# Patient Record
Sex: Male | Born: 1965 | Race: Black or African American | Hispanic: No | Marital: Single | Smoking: Never smoker
Health system: Southern US, Community
[De-identification: ages and names within clinical notes are randomized; demographics above are authoritative.]

## PROBLEM LIST (undated history)

## (undated) DIAGNOSIS — J329 Chronic sinusitis, unspecified: Secondary | ICD-10-CM

## (undated) DIAGNOSIS — E785 Hyperlipidemia, unspecified: Secondary | ICD-10-CM

## (undated) DIAGNOSIS — M1990 Unspecified osteoarthritis, unspecified site: Secondary | ICD-10-CM

## (undated) DIAGNOSIS — I2699 Other pulmonary embolism without acute cor pulmonale: Secondary | ICD-10-CM

## (undated) DIAGNOSIS — G4730 Sleep apnea, unspecified: Secondary | ICD-10-CM

## (undated) DIAGNOSIS — I82409 Acute embolism and thrombosis of unspecified deep veins of unspecified lower extremity: Secondary | ICD-10-CM

## (undated) DIAGNOSIS — F329 Major depressive disorder, single episode, unspecified: Secondary | ICD-10-CM

## (undated) DIAGNOSIS — I739 Peripheral vascular disease, unspecified: Secondary | ICD-10-CM

## (undated) DIAGNOSIS — F32A Depression, unspecified: Secondary | ICD-10-CM

## (undated) DIAGNOSIS — D649 Anemia, unspecified: Secondary | ICD-10-CM

## (undated) DIAGNOSIS — I10 Essential (primary) hypertension: Secondary | ICD-10-CM

## (undated) DIAGNOSIS — G5600 Carpal tunnel syndrome, unspecified upper limb: Secondary | ICD-10-CM

## (undated) DIAGNOSIS — E669 Obesity, unspecified: Secondary | ICD-10-CM

## (undated) HISTORY — DX: Carpal tunnel syndrome, unspecified upper limb: G56.00

## (undated) HISTORY — DX: Hyperlipidemia, unspecified: E78.5

## (undated) HISTORY — DX: Depression, unspecified: F32.A

## (undated) HISTORY — DX: Unspecified osteoarthritis, unspecified site: M19.90

## (undated) HISTORY — DX: Chronic sinusitis, unspecified: J32.9

## (undated) HISTORY — PX: OTHER SURGICAL HISTORY: SHX169

## (undated) HISTORY — DX: Major depressive disorder, single episode, unspecified: F32.9

## (undated) HISTORY — PX: TONSILLECTOMY: SUR1361

---

## 2012-03-20 ENCOUNTER — Encounter (HOSPITAL_COMMUNITY): Payer: Self-pay

## 2012-03-20 ENCOUNTER — Emergency Department (HOSPITAL_COMMUNITY): Payer: Medicaid Other

## 2012-03-20 ENCOUNTER — Inpatient Hospital Stay (HOSPITAL_COMMUNITY)
Admission: EM | Admit: 2012-03-20 | Discharge: 2012-03-25 | DRG: 176 | Disposition: A | Discharge status: Home / Self Care | Payer: Medicaid Other | Attending: Family Medicine | Admitting: Family Medicine

## 2012-03-20 DIAGNOSIS — E871 Hypo-osmolality and hyponatremia: Secondary | ICD-10-CM

## 2012-03-20 DIAGNOSIS — Z79899 Other long term (current) drug therapy: Secondary | ICD-10-CM

## 2012-03-20 DIAGNOSIS — I824Y9 Acute embolism and thrombosis of unspecified deep veins of unspecified proximal lower extremity: Secondary | ICD-10-CM | POA: Diagnosis present

## 2012-03-20 DIAGNOSIS — E872 Acidosis, unspecified: Secondary | ICD-10-CM | POA: Diagnosis present

## 2012-03-20 DIAGNOSIS — I872 Venous insufficiency (chronic) (peripheral): Secondary | ICD-10-CM | POA: Diagnosis present

## 2012-03-20 DIAGNOSIS — D696 Thrombocytopenia, unspecified: Secondary | ICD-10-CM

## 2012-03-20 DIAGNOSIS — M79606 Pain in leg, unspecified: Secondary | ICD-10-CM

## 2012-03-20 DIAGNOSIS — K5900 Constipation, unspecified: Secondary | ICD-10-CM | POA: Diagnosis not present

## 2012-03-20 DIAGNOSIS — E1129 Type 2 diabetes mellitus with other diabetic kidney complication: Secondary | ICD-10-CM

## 2012-03-20 DIAGNOSIS — Z86711 Personal history of pulmonary embolism: Secondary | ICD-10-CM

## 2012-03-20 DIAGNOSIS — I2692 Saddle embolus of pulmonary artery without acute cor pulmonale: Principal | ICD-10-CM | POA: Diagnosis present

## 2012-03-20 DIAGNOSIS — K7689 Other specified diseases of liver: Secondary | ICD-10-CM | POA: Diagnosis present

## 2012-03-20 DIAGNOSIS — Z6841 Body Mass Index (BMI) 40.0 and over, adult: Secondary | ICD-10-CM

## 2012-03-20 DIAGNOSIS — R000 Tachycardia, unspecified: Secondary | ICD-10-CM

## 2012-03-20 DIAGNOSIS — I10 Essential (primary) hypertension: Secondary | ICD-10-CM | POA: Diagnosis present

## 2012-03-20 DIAGNOSIS — E8721 Acute metabolic acidosis: Secondary | ICD-10-CM

## 2012-03-20 DIAGNOSIS — Z86718 Personal history of other venous thrombosis and embolism: Secondary | ICD-10-CM

## 2012-03-20 DIAGNOSIS — I2699 Other pulmonary embolism without acute cor pulmonale: Secondary | ICD-10-CM

## 2012-03-20 DIAGNOSIS — E119 Type 2 diabetes mellitus without complications: Secondary | ICD-10-CM | POA: Diagnosis present

## 2012-03-20 DIAGNOSIS — E8729 Other acidosis: Secondary | ICD-10-CM

## 2012-03-20 DIAGNOSIS — I878 Other specified disorders of veins: Secondary | ICD-10-CM

## 2012-03-20 DIAGNOSIS — D6959 Other secondary thrombocytopenia: Secondary | ICD-10-CM | POA: Diagnosis present

## 2012-03-20 DIAGNOSIS — R5381 Other malaise: Secondary | ICD-10-CM | POA: Diagnosis present

## 2012-03-20 DIAGNOSIS — R739 Hyperglycemia, unspecified: Secondary | ICD-10-CM

## 2012-03-20 HISTORY — DX: Anemia, unspecified: D64.9

## 2012-03-20 HISTORY — DX: Acute embolism and thrombosis of unspecified deep veins of unspecified lower extremity: I82.409

## 2012-03-20 HISTORY — DX: Essential (primary) hypertension: I10

## 2012-03-20 HISTORY — DX: Peripheral vascular disease, unspecified: I73.9

## 2012-03-20 HISTORY — DX: Obesity, unspecified: E66.9

## 2012-03-20 HISTORY — DX: Acute metabolic acidosis: E87.21

## 2012-03-20 LAB — URINALYSIS, ROUTINE W REFLEX MICROSCOPIC
Leukocytes, UA: NEGATIVE
Nitrite: NEGATIVE
Specific Gravity, Urine: >1.046 — ABNORMAL HIGH (ref 1.005–1.030)
pH: 5 (ref 5.0–8.0)

## 2012-03-20 LAB — PRO B NATRIURETIC PEPTIDE
Pro B Natriuretic peptide (BNP): 59.2 pg/mL (ref 0–125)
Pro B Natriuretic peptide (BNP): 72.9 pg/mL (ref 0–125)

## 2012-03-20 LAB — BASIC METABOLIC PANEL
BUN: 13 mg/dL (ref 6–23)
CO2: 16 mEq/L — ABNORMAL LOW (ref 19–32)
Calcium: 8.5 mg/dL (ref 8.4–10.5)
Chloride: 97 mEq/L (ref 96–112)
Creatinine, Ser: 0.94 mg/dL (ref 0.50–1.35)
GFR calc Af Amer: >90 mL/min (ref >90)
GFR calc non Af Amer: >90 mL/min (ref >90)
GFR calc non Af Amer: >90 mL/min (ref >90)
Potassium: 3.7 mEq/L (ref 3.5–5.1)
Potassium: 4.8 mEq/L (ref 3.5–5.1)
Sodium: 131 mEq/L — ABNORMAL LOW (ref 135–145)
Sodium: 133 mEq/L — ABNORMAL LOW (ref 135–145)
Sodium: 134 mEq/L — ABNORMAL LOW (ref 135–145)

## 2012-03-20 LAB — GLUCOSE, CAPILLARY
Glucose-Capillary: 535 mg/dL — ABNORMAL HIGH (ref 70–99)
Glucose-Capillary: 513 mg/dL — ABNORMAL HIGH (ref 70–99)
Glucose-Capillary: 438 mg/dL — ABNORMAL HIGH (ref 70–99)
Glucose-Capillary: 427 mg/dL — ABNORMAL HIGH (ref 70–99)
Glucose-Capillary: 358 mg/dL — ABNORMAL HIGH (ref 70–99)

## 2012-03-20 LAB — CBC
HCT: 46.7 % (ref 39.0–52.0)
RDW: 13.7 % (ref 11.5–15.5)
WBC: 6.3 10*3/uL (ref 4.0–10.5)

## 2012-03-20 LAB — DIFFERENTIAL
Basophils Absolute: 0 10*3/uL (ref 0.0–0.1)
Lymphocytes Relative: 51 % — ABNORMAL HIGH (ref 12–46)
Monocytes Absolute: 0.4 10*3/uL (ref 0.1–1.0)
Neutro Abs: 2.6 10*3/uL (ref 1.7–7.7)

## 2012-03-20 LAB — CARDIAC PANEL(CRET KIN+CKTOT+MB+TROPI)
Relative Index: 1.7 (ref 0.0–2.5)
Total CK: 252 U/L — ABNORMAL HIGH (ref 7–232)

## 2012-03-20 LAB — LACTIC ACID, PLASMA: Lactic Acid, Venous: 5 mmol/L — ABNORMAL HIGH (ref 0.5–2.2)

## 2012-03-20 LAB — TROPONIN I: Troponin I: 0.84 ng/mL (ref <0.30)

## 2012-03-20 LAB — TYPE AND SCREEN: ABO/RH(D): O POS

## 2012-03-20 LAB — D-DIMER, QUANTITATIVE: D-Dimer, Quant: >20 ug/mL-FEU — ABNORMAL HIGH (ref 0.00–0.48)

## 2012-03-20 LAB — PROCALCITONIN: Procalcitonin: 0.31 ng/mL

## 2012-03-20 LAB — KETONES, QUALITATIVE: Acetone, Bld: NEGATIVE

## 2012-03-20 MED ORDER — HEPARIN SODIUM (PORCINE) 5000 UNIT/ML IJ SOLN
INTRAMUSCULAR | Status: AC
  Administered 2012-03-20: 4000 [IU]
  Filled 2012-03-20: qty 1

## 2012-03-20 MED ORDER — MUPIROCIN 2 % EX OINT
1.0000 | TOPICAL_OINTMENT | Freq: Two times a day (BID) | CUTANEOUS | Status: AC
Start: 2012-03-20 — End: 2012-03-25
  Administered 2012-03-20 – 2012-03-25 (×10): 1 via NASAL
  Filled 2012-03-20 (×2): qty 22

## 2012-03-20 MED ORDER — HEPARIN BOLUS VIA INFUSION
4000.0000 [IU] | Freq: Once | INTRAVENOUS | Status: AC
  Administered 2012-03-20: 4000 [IU] via INTRAVENOUS

## 2012-03-20 MED ORDER — SODIUM CHLORIDE 0.9 % IV SOLN: INTRAVENOUS | Status: DC

## 2012-03-20 MED ORDER — CHLORHEXIDINE GLUCONATE CLOTH 2 % EX PADS
6.0000 | MEDICATED_PAD | Freq: Every day | CUTANEOUS | Status: AC
  Administered 2012-03-21 – 2012-03-25 (×5): 6 via TOPICAL

## 2012-03-20 MED ORDER — SODIUM CHLORIDE 0.9 % IV SOLN
INTRAVENOUS | Status: DC
  Administered 2012-03-20: 4.8 [IU]/h via INTRAVENOUS
  Administered 2012-03-21: 08:00:00 via INTRAVENOUS
  Administered 2012-03-21: 16 [IU]/h via INTRAVENOUS
  Filled 2012-03-20 (×3): qty 1

## 2012-03-20 MED ORDER — IOHEXOL 350 MG/ML SOLN
80.0000 mL | Freq: Once | INTRAVENOUS | Status: AC | PRN
  Administered 2012-03-20: 80 mL via INTRAVENOUS

## 2012-03-20 MED ORDER — PANTOPRAZOLE SODIUM 40 MG PO TBEC
40.0000 mg | DELAYED_RELEASE_TABLET | Freq: Every day | ORAL | Status: DC
  Administered 2012-03-21 – 2012-03-25 (×5): 40 mg via ORAL
  Filled 2012-03-20 (×5): qty 1

## 2012-03-20 MED ORDER — SODIUM CHLORIDE 0.9 % IV SOLN
INTRAVENOUS | Status: DC
  Administered 2012-03-21: 20 mL/h via INTRAVENOUS

## 2012-03-20 MED ORDER — HEPARIN (PORCINE) IN NACL 100-0.45 UNIT/ML-% IJ SOLN
1200.0000 [IU]/h | INTRAMUSCULAR | Status: DC
  Administered 2012-03-20: 1000 [IU]/h via INTRAVENOUS
  Administered 2012-03-22 – 2012-03-23 (×2): 1200 [IU]/h via INTRAVENOUS
  Filled 2012-03-20 (×9): qty 250

## 2012-03-20 NOTE — ED Provider Notes (Addendum)
History     CSN: [id number]  Arrival date & time 03/20/12  1337   First MD Initiated Contact with Patient 03/20/12 1352      Chief Complaint  Patient presents with  . Loss of Consciousness    (Consider location/radiation/quality/duration/timing/severity/associated sxs/prior treatment) Patient is a 46 y.o. male presenting with syncope. The history is provided by the patient and the EMS personnel. The history is limited by the condition of the patient.  Loss of Consciousness Pertinent negatives include no chest pain, no headaches and no shortness of breath.   patient is a 46 year old morbidly obese male, who has a history of pulmonary emboli, and prediabetes.  He has not been taking his Coumadin because he says he is unable to afford it.  He was walking and then, he collapsed and fell to the ground.  He denies chest pain, shortness of breath, nausea, vomiting, or palpitations.  Prior to falling.  He has not been sick over the past few days.  He denies pain now.  He does not have a headache, neck pain, weakness, or paresthesias.  He denies recent illness.  Level V caveat applies for urgent need for intervention  Past Medical History  Diagnosis Date  . Hypertension   . Deep vein thrombosis   . Obesity   . Diabetes mellitus     History reviewed. No pertinent past surgical history.  History reviewed. No pertinent family history.  History  Substance Use Topics  . Smoking status: Never Smoker   . Smokeless tobacco: Not on file  . Alcohol Use: No      Review of Systems  Constitutional: Negative for fever and chills.  HENT: Negative for nosebleeds.   Respiratory: Negative for cough, chest tightness and shortness of breath.   Cardiovascular: Positive for syncope. Negative for chest pain and palpitations.  Gastrointestinal: Negative for nausea and vomiting.  Neurological: Negative for seizures, weakness, numbness and headaches.  Psychiatric/Behavioral: Negative for confusion.    All other systems reviewed and are negative.    Allergies  Penicillins  Home Medications   Current Outpatient Rx  Name Route Sig Dispense Refill  . NAPROXEN SODIUM 220 MG PO TABS Oral Take 660 mg by mouth every 12 (twelve) hours. For tendonitis      BP 103/66  Pulse 111  Temp(Src) 99.1 F (37.3 C) (Rectal)  Resp 22  SpO2 98%  Physical Exam  Vitals reviewed. Constitutional: He is oriented to person, place, and time.       Morbidly obese  HENT:  Head: Normocephalic and atraumatic.  Eyes: Conjunctivae and EOM are normal.  Neck: Normal range of motion. Neck supple.  Cardiovascular:  No murmur heard.      Tachycardia  Pulmonary/Chest: Effort normal and breath sounds normal. No respiratory distress. He has no wheezes. He has no rales. He exhibits no tenderness.  Abdominal: Soft. He exhibits no distension.  Musculoskeletal: Normal range of motion. He exhibits no edema.  Neurological: He is alert and oriented to person, place, and time.  Skin: Skin is warm and dry.  Psychiatric: He has a normal mood and affect. Judgment and thought content normal.    ED Course  Procedures (including critical care time) 46 year old, male, with a history of pulmonary emboli, presents to the emergency department after collapsing while walking.  He was asymptomatic prior to falling and he is asymptomatic now.  He has a sinus tachycardia.  We will establish an IV and evaluate him for a pulmonary embolism, given his syncope,  and tachycardia.  Labs Reviewed  CBC - Abnormal; Notable for the following:    Platelets 57 (*) PLATELET COUNT CONFIRMED BY SMEAR   All other components within normal limits  DIFFERENTIAL - Abnormal; Notable for the following:    Neutrophils Relative 42 (*)    Lymphocytes Relative 51 (*)    All other components within normal limits  BASIC METABOLIC PANEL - Abnormal; Notable for the following:    Sodium 131 (*)    CO2 16 (*)    Glucose, Bld 608 (*)    All other  components within normal limits  D-DIMER, QUANTITATIVE - Abnormal; Notable for the following:    D-Dimer, Quant >20.00 (*)    All other components within normal limits  URINALYSIS, ROUTINE W REFLEX MICROSCOPIC   No results found.   No diagnosis found.  ED ECG REPORT   Date: 03/20/2012  EKG Time: 3:21 PM  Rate: 109  Rhythm: sinus tachycardia,    Axis: nl  Intervals:none  ST&T Change: nonspecific tw changes  Narrative Interpretation: sinus tach with nonspecific tw changes            CRITICAL CARE Performed by: Johnwesley Lederman P   Total critical care time: 30 min  Critical care time was exclusive of separately billable procedures and treating other patients.  Critical care was necessary to treat or prevent imminent or life-threatening deterioration.  Critical care was time spent personally by me on the following activities: development of treatment plan with patient and/or surrogate as well as nursing, discussions with consultants, evaluation of patient's response to treatment, examination of patient, obtaining history from patient or surrogate, ordering and performing treatments and interventions, ordering and review of laboratory studies, ordering and review of radiographic studies, pulse oximetry and re-evaluation of patient's condition.  Hyperglycemia IV insulin gtt   I spoke with the CCM md. .  I asked if he would be tpa candidate or if I should start heparin.   He did not tell me to start heparin.  He will send one of his colleagues to eval pt  4:13 PM Critical care service arrived and said to start heparin. MDM  Saddle embolism  Tachycardia hyperglycemia        Cheri Guppy, MD 03/20/12 1523  Cheri Guppy, MD 03/20/12 1601  Cheri Guppy, MD 03/20/12 [phone]

## 2012-03-20 NOTE — Progress Notes (Signed)
ANTICOAGULATION CONSULT NOTE - Initial Consult  Pharmacy Consult for heparin Indication: new PE  Allergies  Allergen Reactions  . Penicillins Hives    Patient Measurements: ht= 68 inches, weight= 143 kg (per patient)   Heparin Dosing Weight: 103 kg  Vital Signs: Temp: 99.1 F (37.3 C) (04/13 1402) Temp src: Rectal (04/13 1402) BP: 117/71 mmHg (04/13 1556) Pulse Rate: 116  (04/13 1556)  Labs:  Basename 03/20/12 1343  HGB 15.6  HCT 46.7  PLT 57*  APTT --  LABPROT --  INR --  HEPARINUNFRC --  CREATININE 0.94  CKTOTAL --  CKMB --  TROPONINI --   CrCl is unknown because there is no height on file for the current visit.  Medical History: Past Medical History  Diagnosis Date  . Hypertension   . Deep vein thrombosis   . Obesity   . Diabetes mellitus     Assessment: Patient is  46 y.o M  with hx PE and was supposed to be on coumadin. He stated that due to financial issues he has been off coumadin since December 2012. Chest CT angio now back positive for "saddle PE." Patient has thrombocytopenia with plt of 57 today.  He received heparin 4000 units IV at 1629 in the ED.  Goal of Therapy:  Heparin level 0.3-0.7 units/ml   Plan:  1) Will increase heparin drip to 1650 units/hr 2) check 6 hour heparin level 3) monitor closely for s/s of bleeding  Daisean Brodhead P 03/20/2012,4:35 PM

## 2012-03-20 NOTE — Progress Notes (Signed)
03/20/12 1624  Discharge Planning  Type of Residence Private residence  Living Arrangements Other relatives  Home Care Services No  Support Systems Other relatives  Do you have any problems obtaining your medications? Yes (Describe) (Per EMR, unable to obtain Coumadin)  Once you are discharged, how will you get to your follow-up appointment? Other (Comment) (Unable to identify at this time)  Expected Discharge Date 03/22/12  Social Work Consult Needed No   Dionne Milo MSW [facility] Emergency Dept. Weekend/Social Worker [phone]

## 2012-03-20 NOTE — Progress Notes (Signed)
03/20/12 1625  OTHER  CSW Follow Up Status No follow-up needed (Consult LCSW if psychosocial needs arise)   Dionne Milo MSW [facility] Emergency Dept. Weekend/Social Worker [phone]

## 2012-03-20 NOTE — ED Notes (Signed)
Heparin 4000 units given at 1629, not 5000 unit at 1700, error in charting while fizxi

## 2012-03-20 NOTE — ED Notes (Signed)
EMS reports pt found on the ground in front of apt, took 3 mins to arouse, went apnic, hypotensive,  78/60, sats 78%, then hypertensive 196/124,

## 2012-03-20 NOTE — ED Notes (Signed)
Dr Nino Parsley notified of lab draw glucose of 608

## 2012-03-20 NOTE — ED Notes (Signed)
IV team at the patient bedside 

## 2012-03-20 NOTE — H&P (Signed)
Name: Travis Rollins MRN: [id number] DOB: 09/26/1966    LOS: 0    History of Present Illness:  46 YO AAM, morbidly obese w/ PMH of HTN, DM, Chronic venous stasis of left leg, and prior DVT and PE (took self off coumadin 5 months ago). Approx 2 days prior to admit had what he though was muscle strain in Left posterior leg. Was walking to store on 4/13 and had sudden sharp grabbing sensation in his chest. This is similar to the presentation and his last pulmonary emboli 6 years ago. He he recalled this was followed by extreme shortness of breath, he then apparently had a syncopal episode. On EMS arrival he was hypotensive but responded to IVFs. Dx eval demonstrated Massive Pulmonary emboli.   Lines / Drains:   Cultures:   Antibiotics:   Tests / Events: 2D echo 4/13>>>> LE [place] 4/13>>> CT chest 4/13>>>Large acute pulmonary embolism spanning the main pulmonary arteries (saddle pulmonary embolism). Overall clot burden is severe and extends into the proximal branches of the left and right pulmonary arteries.   HPI This is a morbidly obese 46 year old male who moved to [place] approximately one year ago. Past medical history of DVT and what sounds like massive pulmonary emboli approximately 6 years ago. He had been on warfarin therapy until approximately 6 months prior to presentation. He removed himself off of this medication, because he had right foot pain which he felt was tendinitis, and he had wanted to take naproxen for analgesic relief. About that time he also removed himself from several other therapeutic medications including his antihypertensives and his metformin. He states he's been in his usual health with the exception of the being treated for cellulitis approximately 3 months ago. Also he notes approximately 2 days prior to presentation he had significant left posterior leg pain, which she associated with a presumed muscle strain.He states at baseline he can  ambulate approximately 10 minutes on flat going before onset of dyspnea. He is debilitated from chronic left lower extremity venous stasis disease and his obesity. He reported he was on his way to the store on 4/13 when he noticed a sudden sharp grabbing sensation in his chest. This is similar to the presentation and his last pulmonary emboli 6 years ago. He he recalled this was followed by extreme shortness of breath, he then apparently had a syncopal episode, this was witnessed and EMS was notified. Apparently on presentation he had brief period of apnea and unresponsiveness, as well as hypotension. He was given IV fluids, his level of consciousness improved, he was transferred to the emergency room and was in hospital. Diagnostic evaluation demonstrated a highly elevated d-dimer, and CT angiogram demonstrated a massive pulmonary emboli. Because of the size of pulmonary emboli, and the concern of clinical deterioration pulmonary critical care team was asked to. He has had no recent travel, no family history, he has had prior PE. And deep vein thrombosis in the past. On time of presentation he denies dyspnea, denies discomfort, denies chest pain, denies leg pain.  Past Medical History  Diagnosis Date  . Hypertension   . Deep vein thrombosis   . Obesity   . Diabetes mellitus    History reviewed. No pertinent past surgical history. Prior to Admission medications   Medication Sig Start Date End Date Taking? Authorizing Provider  naproxen sodium (ANAPROX) 220 MG tablet Take 660 mg by mouth every 12 (twelve) hours. For tendonitis   Yes Historical Provider, MD  Allergies Allergies  Allergen Reactions  . Penicillins Hives    Family History History reviewed. No pertinent family history.  Social History  reports that he has never smoked. He does not have any smokeless tobacco history on file. He reports that he does not drink alcohol or use illicit drugs.  Review Of Systems   Review of Systems    Constitutional: No weight loss, gain, night sweats, Fevers, chills, fatigue .  HEENT: No headaches, visual changes, Difficulty swallowing, Tooth/dental problems, or Sore throat,  No sneezing, itching, ear ache, nasal congestion, post nasal drip, no visual complaints CV: No chest pain except as described above, Orthopnea, PND, has chronic LLE swelling, no current dizziness,- palpitations, + syncope as described.  GI No heartburn, indigestion, abdominal pain, nausea, vomiting, diarrhea, change in bowel habits, loss of appetite, bloody stools.  Resp: No cough, No coughing up of blood. No change in color of mucus. No wheezing. SOB at baseline Skin: no rash or itching or icterus GU: no dysuria, change in color of urine, no urgency or frequency. No flank pain, no hematuria + urinary frequency MS: No joint pain or swelling. No decreased range of motion  Psych: No change in mood or affect. No depression or anxiety.  Neuro: no difficulty with speech, weakness, numbness, ataxia  Endo: + thirst   Vital Signs: BP 117/71  Pulse 116  Temp(Src) 99.1 F (37.3 C) (Rectal)  Resp 21  SpO2 98%       . sodium chloride    . heparin    . insulin (NOVOLIN-R) infusion    . insulin (NOVOLIN-R) infusion      No intake or output data in the 24 hours ending 03/20/12 1648  Physical Examination: General:  Morbidly obese male currently in no acute distress Neuro:  Awake alert and oriented no focal deficits HEENT:  Poor dentition, no JVD, mucous membranes moist Cardiovascular:  Regular rate and rhythm Lungs:  Decreased throughout Abdomen: obese, soft, no clear organomegaly Musculoskeletal:  intact Skin:  Left lower extremity with chronic venous stasis changes, left lower extremity more edematous than right  Ventilator settings:    Labs and Imaging:   Lab 03/20/12 1343  NA 131*  K 3.7  CL 97  CO2 16*  BUN 13  CREATININE 0.94  GLUCOSE 608*    Lab 03/20/12 1343  HGB 15.6  HCT 46.7  WBC 6.3   PLT 57*    Assessment and Plan:  Saddle Pulmonary emboli: has had prior history of pulmonary emboli and deep vein thrombosis 6 years ago. Primary risk factors appear to be immobility and chronic venous stasis disease. He removed himself from anticoagulation 4 months ago. Although pulmonary emboli is massive he is hemodynamically stable and requiring limited oxygen.  Plan -admit to intensive care given risk for decompensation -Heparin bolus and infusion, will initiate warfarin therapy on 4/14 assuming he remains hemodynamically stable a, and does not require thrombolytics -Check echocardiogram and lower extremity Dopplers -Supplemental oxygen -He will now need lifelong warfarin therapy.  LLE Leg pain w/ chronic LLE Venous stasis. This leg pain may have represented recurrent DVT. Plan: -Lower extremity Dopplers ordered  Diabetes mellitus with Hyperglycemia CBG (last 3)   Basename 03/20/12 1709  GLUCAP 535*  Plan: -Insulin infusion  Metabolic acidosis with elevated anion gap: At this point it is unclear if his anion gap is precipitated by prior syncopal episode and possibly lactic acidosis, or potentially DKA. His presenting glucose was over 600. However his mental status is  completely intact.  Lab 03/20/12 1343  CO2 16*  CL 97  NA 131*  LATICACIDVEN --  Plan: -Check serum ketones, send lactic acid. -Serial blood chemistries -Continue glucose stabilizer -Continue hydration  Thrombocytopenia: baseline unknown  Lab 03/20/12 1343  PLT 57*  Plan: -Monitor, he is being placed on heparin  Hyponatremia  Lab 03/20/12 1343  NA 131*  Plan -Hydrate with normal saline -Follow up chemistry  Best practices / Disposition: -->ICU status under PCCM -->full code -->Heparin Drip -->Protonix for GI Px -->diet: N.p.o.   The patient is critically ill with multiple organ systems failure and requires high complexity decision making for assessment and support, frequent evaluation  and titration of therapies, application of advanced monitoring technologies and extensive interpretation of multiple databases. Critical Care Time devoted to patient care services described in this note is 45 minutes.  BABCOCK,PETE 03/20/2012, 4:48 PM  I have seen and examined this patient with the nurse practionner and agree with the above assessment and plan.   Shan Levans Beeper  [phone]  Cell  [phone]  If no response or cell goes to voicemail, call beeper [phone]

## 2012-03-20 NOTE — Plan of Care (Signed)
Problem: Phase I Progression Outcomes Goal: OOB as tolerated unless otherwise ordered Outcome: Not Applicable Date Met:  03/20/12 Strict bedrest ordered

## 2012-03-20 NOTE — ED Notes (Signed)
Dr Delford Field notified of triponin level

## 2012-03-21 ENCOUNTER — Inpatient Hospital Stay (HOSPITAL_COMMUNITY): Payer: Medicaid Other

## 2012-03-21 DIAGNOSIS — I2699 Other pulmonary embolism without acute cor pulmonale: Secondary | ICD-10-CM

## 2012-03-21 LAB — GLUCOSE, CAPILLARY
Glucose-Capillary: 335 mg/dL — ABNORMAL HIGH (ref 70–99)
Glucose-Capillary: 259 mg/dL — ABNORMAL HIGH (ref 70–99)
Glucose-Capillary: 235 mg/dL — ABNORMAL HIGH (ref 70–99)
Glucose-Capillary: 135 mg/dL — ABNORMAL HIGH (ref 70–99)
Glucose-Capillary: 126 mg/dL — ABNORMAL HIGH (ref 70–99)
Glucose-Capillary: 133 mg/dL — ABNORMAL HIGH (ref 70–99)
Glucose-Capillary: 122 mg/dL — ABNORMAL HIGH (ref 70–99)
Glucose-Capillary: 138 mg/dL — ABNORMAL HIGH (ref 70–99)
Glucose-Capillary: 178 mg/dL — ABNORMAL HIGH (ref 70–99)
Glucose-Capillary: 230 mg/dL — ABNORMAL HIGH (ref 70–99)
Glucose-Capillary: 191 mg/dL — ABNORMAL HIGH (ref 70–99)

## 2012-03-21 LAB — CARDIAC PANEL(CRET KIN+CKTOT+MB+TROPI)
CK, MB: 5.7 ng/mL — ABNORMAL HIGH (ref 0.3–4.0)
Relative Index: 1.7 (ref 0.0–2.5)
Total CK: 351 U/L — ABNORMAL HIGH (ref 7–232)

## 2012-03-21 LAB — CBC
HCT: 48.2 % (ref 39.0–52.0)
Hemoglobin: 15.8 g/dL (ref 13.0–17.0)
MCHC: 32.8 g/dL (ref 30.0–36.0)
WBC: 4 10*3/uL (ref 4.0–10.5)

## 2012-03-21 LAB — HEPARIN LEVEL (UNFRACTIONATED)
Heparin Unfractionated: 0.8 IU/mL — ABNORMAL HIGH (ref 0.30–0.70)
Heparin Unfractionated: 0.98 IU/mL — ABNORMAL HIGH (ref 0.30–0.70)

## 2012-03-21 LAB — BASIC METABOLIC PANEL
Calcium: 8.7 mg/dL (ref 8.4–10.5)
Chloride: 105 mEq/L (ref 96–112)
Creatinine, Ser: 0.75 mg/dL (ref 0.50–1.35)
Creatinine, Ser: 0.67 mg/dL (ref 0.50–1.35)
GFR calc Af Amer: >90 mL/min (ref >90)
GFR calc non Af Amer: >90 mL/min (ref >90)
Sodium: 142 mEq/L (ref 135–145)

## 2012-03-21 LAB — PROTIME-INR
INR: 1.19 (ref 0.00–1.49)
Prothrombin Time: 15.4 seconds — ABNORMAL HIGH (ref 11.6–15.2)

## 2012-03-21 MED ORDER — INSULIN ASPART 100 UNIT/ML ~~LOC~~ SOLN: 0.0000 [IU] | SUBCUTANEOUS | Status: DC

## 2012-03-21 MED ORDER — WARFARIN - PHARMACIST DOSING INPATIENT
Freq: Every day | Status: DC
  Administered 2012-03-23: 21:00:00

## 2012-03-21 MED ORDER — WARFARIN VIDEO
Freq: Once | Status: AC
  Administered 2012-03-23: 21:00:00

## 2012-03-21 MED ORDER — INSULIN ASPART 100 UNIT/ML ~~LOC~~ SOLN
0.0000 [IU] | Freq: Three times a day (TID) | SUBCUTANEOUS | Status: DC
  Administered 2012-03-21: 4 [IU] via SUBCUTANEOUS
  Administered 2012-03-22: 7 [IU] via SUBCUTANEOUS
  Administered 2012-03-22 (×2): 11 [IU] via SUBCUTANEOUS
  Administered 2012-03-23 (×3): 7 [IU] via SUBCUTANEOUS
  Administered 2012-03-24 (×2): 4 [IU] via SUBCUTANEOUS
  Administered 2012-03-24: 7 [IU] via SUBCUTANEOUS
  Administered 2012-03-25: 3 [IU] via SUBCUTANEOUS
  Administered 2012-03-25: 7 [IU] via SUBCUTANEOUS
  Administered 2012-03-25: 4 [IU] via SUBCUTANEOUS

## 2012-03-21 MED ORDER — INSULIN ASPART 100 UNIT/ML ~~LOC~~ SOLN
6.0000 [IU] | Freq: Three times a day (TID) | SUBCUTANEOUS | Status: DC
  Administered 2012-03-21 – 2012-03-23 (×5): 6 [IU] via SUBCUTANEOUS

## 2012-03-21 MED ORDER — INSULIN GLARGINE 100 UNIT/ML ~~LOC~~ SOLN: 10.0000 [IU] | Freq: Every day | SUBCUTANEOUS | Status: DC

## 2012-03-21 MED ORDER — PATIENT'S GUIDE TO USING COUMADIN BOOK
Freq: Once | Status: AC
  Administered 2012-03-21: 15:00:00
  Filled 2012-03-21: qty 1

## 2012-03-21 MED ORDER — INSULIN GLARGINE 100 UNIT/ML ~~LOC~~ SOLN
25.0000 [IU] | Freq: Every day | SUBCUTANEOUS | Status: DC
  Administered 2012-03-21 – 2012-03-22 (×2): 25 [IU] via SUBCUTANEOUS

## 2012-03-21 MED ORDER — INSULIN ASPART 100 UNIT/ML ~~LOC~~ SOLN
0.0000 [IU] | Freq: Every day | SUBCUTANEOUS | Status: DC
  Administered 2012-03-21: 3 [IU] via SUBCUTANEOUS
  Administered 2012-03-22: 2 [IU] via SUBCUTANEOUS
  Administered 2012-03-23: 4 [IU] via SUBCUTANEOUS

## 2012-03-21 MED ORDER — DEXTROSE 10 % IV SOLN: INTRAVENOUS | Status: DC

## 2012-03-21 MED ORDER — WARFARIN SODIUM 10 MG PO TABS
10.0000 mg | ORAL_TABLET | Freq: Once | ORAL | Status: AC
  Administered 2012-03-21: 10 mg via ORAL
  Filled 2012-03-21: qty 1

## 2012-03-21 MED ORDER — INSULIN ASPART 100 UNIT/ML ~~LOC~~ SOLN: 0.0000 [IU] | Freq: Three times a day (TID) | SUBCUTANEOUS | Status: DC

## 2012-03-21 NOTE — Progress Notes (Signed)
*  PRELIMINARY RESULTS* Vascular Ultrasound Lower extremity venous duplex has been completed.  Preliminary findings: Right= No evidence of DVT. Left= Evidence of DVT involving the popliteal vein.  Farrel Demark, RDMS 03/21/2012, 12:11 PM

## 2012-03-21 NOTE — Progress Notes (Signed)
Name: Travis Rollins MRN: [id number] DOB: 12/06/1966    LOS: 1    History of Present Illness:  46 YO AAM, morbidly obese w/ PMH of HTN, DM, Chronic venous stasis of left leg, and prior DVT and PE (took self off coumadin 5 months ago). Approx 2 days prior to admit had what he though was muscle strain in Left posterior leg. Was walking to store on 4/13 and had sudden sharp grabbing sensation in his chest. This is similar to the presentation and his last pulmonary emboli 6 years ago. He he recalled this was followed by extreme shortness of breath, he then apparently had a syncopal episode. On EMS arrival he was hypotensive but responded to IVFs. Dx eval demonstrated Massive Pulmonary emboli.   Lines / Drains:   Cultures:   Antibiotics:   Tests / Events: 2D echo 4/13>>>> LE [place] 4/13>>> CT chest 4/13>>>Large acute pulmonary embolism spanning the main pulmonary arteries (saddle pulmonary embolism). Overall clot burden is severe and extends into the proximal branches of the left and right pulmonary arteries.  Vital Signs: BP 116/75  Pulse 85  Temp(Src) 97.5 F (36.4 C) (Oral)  Resp 17  Ht 5\' 8"  (1.727 m)  Wt 144.7 kg (319 lb 0.1 oz)  BMI 48.50 kg/m2  SpO2 97% 2 liters       . sodium chloride    . heparin 1,500 Units/hr (03/21/12 0123)  . insulin (NOVOLIN-R) infusion 7 mL/hr at 03/21/12 0836  . DISCONTD: insulin (NOVOLIN-R) infusion       Intake/Output Summary (Last 24 hours) at 03/21/12 0865 Last data filed at 03/21/12 0800  Gross per 24 hour  Intake   2881 ml  Output   1125 ml  Net   1756 ml    Physical Examination: General:  Morbidly obese male currently in no acute distress, Really no change in physical exam Neuro:  Awake alert and oriented no focal deficits HEENT:  Poor dentition, no JVD, mucous membranes moist Cardiovascular:  Regular rate and rhythm Lungs:  Decreased throughout Abdomen: obese, soft, no clear organomegaly Musculoskeletal:  intact Skin:   Left lower extremity with chronic venous stasis changes, left lower extremity more edematous than right  Ventilator settings: Vent Mode:  [-]  FiO2 (%):  [3 %] 3 %  Labs and Imaging:   Lab 03/21/12 0029 03/20/12 2049 03/20/12 1648  NA 137 134* 133*  K 4.1 4.8 4.7  CL 105 102 98  CO2 22 19 18*  BUN 13 13 13   CREATININE 0.75 0.75 0.88  GLUCOSE 303* 431* 560*    Lab 03/20/12 1343  HGB 15.6  HCT 46.7  WBC 6.3  PLT 57*   PCXR: Vascular congestion, basilar volume loss. No clear infiltrates.  Assessment and Plan:  Saddle Pulmonary emboli: has had prior history of pulmonary emboli and deep vein thrombosis 6 years ago. Primary risk factors appear to be immobility and chronic venous stasis disease. He removed himself from anticoagulation 4 months ago: hemodynamically stable, minimal oxygen requirements. Plan -continue heparin infusion, we will go ahead and add warfarin therapy today -followup echocardiogram and lower extremity Dopplers -Supplemental oxygen -He will now need lifelong warfarin therapy.  LLE Leg pain w/ chronic LLE Venous stasis. This leg pain may have represented recurrent DVT. Plan: -Lower extremity Dopplers ordered  Diabetes mellitus with Hyperglycemia: getting closer to better glycemic control CBG (last 3)   Basename 03/21/12 0817 03/21/12 0727 03/21/12 0621  GLUCAP 126* 134* 156*  Plan: -Insulin infusion, on  transition to sliding scale -Check A1c and lipid profile  Metabolic acidosis with elevated anion gap: although his glucose was over 600. His serum serum ketones were negative, his lactic acid was 5, so most likely anion gap was due to elevated lactate. Anion gap now closed. Metabolic acidosis resolved.   Thrombocytopenia: baseline unknown  Lab 03/20/12 1343  PLT 57*  Plan: -Monitor,  Hyponatremia: improved  Lab 03/21/12 0029 03/20/12 2049 03/20/12 1648  NA 137 134* 133*  Plan -Hydrate with normal saline -Follow up chemistry  Elevated  troponins: most likely due to pulmonary emboli no chest pain Plan -Continue telemetry monitoring -Check EKG  Best practices / Disposition: -->ICU status under PCCM -->full code -->Heparin Drip -->Protonix for GI Px -->diet: po diet   Anders Simmonds ACNP-BC [facility] Pulmonary/Critical Care Pager # [phone] OR # [phone] if no answer    I have seen and examined this patient with the nurse practionner and agree with the above assessment and plan.    Shan Levans Beeper  [phone]  Cell  [phone]  If no response or cell goes to voicemail, call beeper [phone]

## 2012-03-21 NOTE — Progress Notes (Signed)
  Echocardiogram 2D Echocardiogram has been performed.  Mercy Moore 03/21/2012, 12:10 PM

## 2012-03-21 NOTE — Progress Notes (Signed)
Lab contacted RN to inform of Pt's (+) MRSA PCR swab; Pt placed on contact precautions and informed of results, verbal education provided and questions answered. [facility] print-out provided to Pt for further information.

## 2012-03-21 NOTE — Progress Notes (Signed)
ANTICOAGULATION CONSULT NOTE - Follow Up Consult  Pharmacy Consult for heparin>>warfarin Indication: Saddle PE/LLE DVT  Allergies  Allergen Reactions  . Penicillins Hives    Patient Measurements: Height: 5\' 8"  (172.7 cm) Weight: 319 lb 0.1 oz (144.7 kg) IBW/kg (Calculated) : 68.4  Heparin Dosing Weight: 103kg  Vital Signs: Temp: 96.9 F (36.1 C) (04/14 1200) Temp src: Oral (04/14 1200) BP: 123/83 mmHg (04/14 1300) Pulse Rate: 82  (04/14 1300)  Labs:  Basename 03/21/12 1100 03/21/12 0029 03/21/12 0028 03/20/12 2049 03/20/12 1649 03/20/12 1343  HGB 15.8 -- -- -- -- 15.6  HCT 48.2 -- -- -- -- 46.7  PLT 45* -- -- -- -- 57*  APTT -- -- -- -- -- --  LABPROT -- -- 15.4* -- -- --  INR -- -- 1.19 -- -- --  HEPARINUNFRC 0.98* -- 0.80* -- -- --  CREATININE 0.67 0.75 -- 0.75 -- --  CKTOTAL 351* 339* -- -- 252* --  CKMB 5.7* 5.9* -- -- 4.4* --  TROPONINI 0.61* 1.24* -- -- 0.94* --   Estimated Creatinine Clearance: 164.8 ml/min (by C-G formula based on Cr of 0.67).   Medications:  Heparin drip at 1500 units/hr  Assessment: 46 year old male with saddle PE and now dvt in LLE noted on ultrasound. Heparin started yesterday and levels have been elevated. No bleeding complications have been noted by nursing. Will decrease heparin rate and start warfarin today. Warfarin score of 11. Baseline coags appropriate. Goal of Therapy:  INR 2-3 Heparin level 0.3-0.7 units/ml   Plan:  Decrease IV heparin to 1200 units/hr Recheck level tonight Warfarin 10mg  today INR daily Order warfarin education materials  Travis Rollins 03/21/2012,1:23 PM

## 2012-03-21 NOTE — Progress Notes (Signed)
ANTICOAGULATION CONSULT NOTE - Follow Up Consult  Pharmacy Consult for heparin>>warfarin Indication: Saddle PE/LLE DVT  Allergies  Allergen Reactions  . Penicillins Hives    Patient Measurements: Height: 5\' 8"  (172.7 cm) Weight: 319 lb 0.1 oz (144.7 kg) IBW/kg (Calculated) : 68.4  Heparin Dosing Weight: 103kg  Vital Signs: Temp: 96.9 F (36.1 C) (04/14 1200) Temp src: Oral (04/14 1200) BP: 107/72 mmHg (04/14 1900) Pulse Rate: 85  (04/14 1900)  Labs:  Basename 03/21/12 1903 03/21/12 1100 03/21/12 0029 03/21/12 0028 03/20/12 2049 03/20/12 1649 03/20/12 1343  HGB -- 15.8 -- -- -- -- 15.6  HCT -- 48.2 -- -- -- -- 46.7  PLT -- 45* -- -- -- -- 57*  APTT -- -- -- -- -- -- --  LABPROT -- -- -- 15.4* -- -- --  INR -- -- -- 1.19 -- -- --  HEPARINUNFRC 0.47 0.98* -- 0.80* -- -- --  CREATININE -- 0.67 0.75 -- 0.75 -- --  CKTOTAL -- 351* 339* -- -- 252* --  CKMB -- 5.7* 5.9* -- -- 4.4* --  TROPONINI -- 0.61* 1.24* -- -- 0.94* --   Estimated Creatinine Clearance: 164.8 ml/min (by C-G formula based on Cr of 0.67).   Medications:  Heparin drip at 1500 units/hr  Assessment: 46 year old male with saddle PE and now dvt in LLE noted on ultrasound. Heparin started yesterday and level is now at goal.  Goal of Therapy:  INR 2-3 Heparin level 0.3-0.7 units/ml   Plan:  -No heparin changes -Will follow heparin level in am  Benny Lennert 03/21/2012,7:37 PM

## 2012-03-21 NOTE — Progress Notes (Signed)
Notified by ultrasound tech that pt's LE dopplers were positive for DVT. PA Babcock on floor and made aware. No new orders Felipa Emory

## 2012-03-21 NOTE — Progress Notes (Signed)
ANTICOAGULATION CONSULT NOTE - Follow Up Consult  Pharmacy Consult for heparin Indication: pulmonary embolus  Labs:  Basename 03/21/12 0029 03/21/12 0028 03/20/12 2049 03/20/12 1649 03/20/12 1648 03/20/12 1613 03/20/12 1343  HGB -- -- -- -- -- -- 15.6  HCT -- -- -- -- -- -- 46.7  PLT -- -- -- -- -- -- 57*  APTT -- -- -- -- -- -- --  LABPROT -- 15.4* -- -- -- -- --  INR -- 1.19 -- -- -- -- --  HEPARINUNFRC -- 0.80* -- -- -- -- --  CREATININE 0.75 -- 0.75 -- 0.88 -- --  CKTOTAL -- -- -- 252* -- -- --  CKMB -- -- -- 4.4* -- -- --  TROPONINI -- -- -- 0.94* -- 0.84* --    Assessment: 45yo male supratherapeutic on heparin with initial dosing for PE.  Goal of Therapy:  Heparin level 0.3-0.7 units/ml   Plan:  Will decrease gtt by 1 unit/kg/hr to 1500 units/hr and check level with next CE.  Colleen Can PharmD BCPS 03/21/2012,1:24 AM

## 2012-03-22 ENCOUNTER — Inpatient Hospital Stay (HOSPITAL_COMMUNITY): Payer: Medicaid Other

## 2012-03-22 DIAGNOSIS — D696 Thrombocytopenia, unspecified: Secondary | ICD-10-CM

## 2012-03-22 DIAGNOSIS — R7309 Other abnormal glucose: Secondary | ICD-10-CM

## 2012-03-22 DIAGNOSIS — E119 Type 2 diabetes mellitus without complications: Secondary | ICD-10-CM

## 2012-03-22 LAB — LIPID PANEL
LDL Cholesterol: 132 mg/dL — ABNORMAL HIGH (ref 0–99)
Total CHOL/HDL Ratio: 4.8 RATIO
VLDL: 33 mg/dL (ref 0–40)

## 2012-03-22 LAB — HEPATIC FUNCTION PANEL
Albumin: 3 g/dL — ABNORMAL LOW (ref 3.5–5.2)
Alkaline Phosphatase: 71 U/L (ref 39–117)
Bilirubin, Direct: 0.1 mg/dL (ref 0.0–0.3)
Total Bilirubin: 0.4 mg/dL (ref 0.3–1.2)

## 2012-03-22 LAB — HEPARIN LEVEL (UNFRACTIONATED): Heparin Unfractionated: 0.45 IU/mL (ref 0.30–0.70)

## 2012-03-22 LAB — GLUCOSE, CAPILLARY
Glucose-Capillary: 244 mg/dL — ABNORMAL HIGH (ref 70–99)
Glucose-Capillary: 287 mg/dL — ABNORMAL HIGH (ref 70–99)

## 2012-03-22 LAB — CBC
HCT: 41.7 % (ref 39.0–52.0)
Platelets: 45 10*3/uL — ABNORMAL LOW (ref 150–400)
RBC: 4.46 MIL/uL (ref 4.22–5.81)
RDW: 14.3 % (ref 11.5–15.5)
WBC: 4.5 10*3/uL (ref 4.0–10.5)

## 2012-03-22 LAB — BASIC METABOLIC PANEL
Calcium: 8.1 mg/dL — ABNORMAL LOW (ref 8.4–10.5)
GFR calc Af Amer: >90 mL/min (ref >90)
GFR calc non Af Amer: >90 mL/min (ref >90)
Potassium: 3.6 mEq/L (ref 3.5–5.1)
Sodium: 136 mEq/L (ref 135–145)

## 2012-03-22 LAB — HEMOGLOBIN A1C: Mean Plasma Glucose: 335 mg/dL — ABNORMAL HIGH (ref <117)

## 2012-03-22 LAB — PROTIME-INR
INR: 1.27 (ref 0.00–1.49)
Prothrombin Time: 16.2 seconds — ABNORMAL HIGH (ref 11.6–15.2)

## 2012-03-22 LAB — PLATELET COUNT: Platelets: 45 10*3/uL — ABNORMAL LOW (ref 150–400)

## 2012-03-22 MED ORDER — INSULIN GLARGINE 100 UNIT/ML ~~LOC~~ SOLN
25.0000 [IU] | Freq: Two times a day (BID) | SUBCUTANEOUS | Status: DC
  Administered 2012-03-22 – 2012-03-24 (×4): 25 [IU] via SUBCUTANEOUS

## 2012-03-22 MED ORDER — WARFARIN SODIUM 10 MG PO TABS
10.0000 mg | ORAL_TABLET | Freq: Once | ORAL | Status: AC
  Administered 2012-03-22: 10 mg via ORAL
  Filled 2012-03-22: qty 1

## 2012-03-22 NOTE — Progress Notes (Signed)
LB PCCM PROGRESS NOTE Name: RECE ZECHMAN MRN: [id number] DOB: 1966/09/05    LOS: 2    History of Present Illness: 46 y/o male presented on 4/13 with recurrent massive pulmonary embolism.  Lines / Drains:   Cultures:   Antibiotics:   Tests / Events: 2D echo 4/13>>>> LVH, LVEF 45-50%; RV mildly dilated, Systolic PA pressure mildly increased LE [place] 4/13>>> CT chest 4/13>>>Large acute pulmonary embolism spanning the main pulmonary arteries (saddle pulmonary embolism). Overall clot burden is severe and extends into the proximal branches of the left and right pulmonary arteries.  Subjective: Feeling well, hungry, achy, no chest pain or dyspnea  Vital Signs: BP 114/86  Pulse 77  Temp(Src) 97.9 F (36.6 C) (Oral)  Resp 18  Ht 5\' 8"  (1.727 m)  Wt 147.1 kg (324 lb 4.8 oz)  BMI 49.31 kg/m2  SpO2 97% 2 liters       . sodium chloride 20 mL/hr at 03/22/12 0600  . heparin 1,200 Units/hr (03/22/12 0502)  . DISCONTD: dextrose    . DISCONTD: insulin (NOVOLIN-R) infusion 14.2 mL/hr at 03/21/12 1526     Intake/Output Summary (Last 24 hours) at 03/22/12 1033 Last data filed at 03/22/12 0800  Gross per 24 hour  Intake 3031.48 ml  Output    800 ml  Net 2231.48 ml    Physical Examination:  Gen: awake, and alert, comfortable HEENT: NCAT, PERRL, OP clear PULM: CTA B CV: RRR, nl s1/s2, distant AB: bS+, soft, nontender Ext: warm, no edema Neuro: A&Ox4, maew  Labs and Imaging:   Lab 03/22/12 0515 03/21/12 1100 03/21/12 0029  NA 136 142 137  K 3.6 4.0 4.1  CL 104 107 105  CO2 24 23 22   BUN 12 13 13   CREATININE 0.68 0.67 0.75  GLUCOSE 262* 131* 303*    Lab 03/22/12 0515 03/21/12 1100 03/20/12 1343  HGB 13.8 15.8 15.6  HCT 41.7 48.2 46.7  WBC 4.5 4.0 6.3  PLT 45* 45* 57*   PCXR: Vascular congestion, basilar volume loss. No clear infiltrates.  Assessment and Plan:  Saddle Pulmonary emboli: has had prior history of pulmonary emboli and deep vein thrombosis 6  years ago. Primary risk factors appear to be immobility and chronic venous stasis disease. He removed himself from anticoagulation 4 months ago: hemodynamically stable, minimal oxygen requirements. Plan -continue heparin infusion, heparin per pharmacy -Supplemental oxygen -He will now need lifelong warfarin therapy.  LLE Leg pain w/ chronic LLE Venous stasis. This leg pain may have represented recurrent DVT. 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  Lab 03/22/12 0515 03/21/12 1100 03/20/12 1343  PLT 45* 45* 57*  Plan: -LFT -RUQ ultrasound -HIT panel -contin heparin for now -anti-platelet antibody  Hyponatremia: improved  Lab 03/22/12 0515 03/21/12 1100 03/21/12 0029  NA 136 142 137  Plan -bmet in am  Elevated troponins: most likely due to pulmonary emboli no chest pain Plan -Continue telemetry monitoring -Check EKG  Best practices / Disposition: -->change to SDU status today, keep on PCCM  service -->full code -->Heparin Drip -->Protonix for GI Px -->diet: po diet  Yolonda Kida PCCM Pager: [phone] If no response, call [phone]

## 2012-03-22 NOTE — Progress Notes (Signed)
Nursing brief daily progress: Patient has remained stable all day today, no pain issues, great appetite, discussed his diabetes control and diet (needs further education).  Report called to room 3304, will be transported with RN on monitor at or near shift change ([phone]).

## 2012-03-22 NOTE — Progress Notes (Signed)
Inpatient Diabetes Program Recommendations  AACE/ADA: New Consensus Statement on Inpatient Glycemic Control  Target Ranges:  Prepandial:   less than 140 mg/dL      Peak postprandial:   less than 180 mg/dL (1-2 hours)      Critically ill patients:  140 - 180 mg/dL  Pager:  [phone] Hours:  8 am-10pm   Reason for Visit: Elevated glucose:  262 mg/dL  Inpatient Diabetes Program Recommendations Insulin - Basal: Increase Lantus to 35 units daily Outpatient Referral: Please order outpatient diabetes education

## 2012-03-22 NOTE — Progress Notes (Signed)
   CARE MANAGEMENT NOTE 03/22/2012  Patient:  Travis Rollins, Travis Rollins   Account Number:  [id number]  Date Initiated:  03/22/2012  Documentation initiated by:  Matteson Blue  Subjective/Objective Assessment:   PT ADM WITH MASSIVE PULMONARY EMBOLUS; CURRENTLY ON HEPARIN DRIP.     Action/Plan:   PTA, PT LIVES WITH MOTHER.  PT TO DC ON COUMADIN, A $4 GENERIC.  HE STATES HE WILL HAVE NO PROBLEM OBTAINING MEDS AT DISCHARGE, AS HE HAS MEDICAID.   Anticipated DC Date:  03/25/2012   Anticipated DC Plan:  HOME/SELF CARE  In-house referral  Clinical Social Worker         Choice offered to / List presented to:             Status of service:  In process, will continue to follow Medicare Important Message given?   (If response is "NO", the following Medicare IM given date fields will be blank) Date Medicare IM given:   Date Additional Medicare IM given:    Discharge Disposition:    Per UR Regulation:    If discussed at Long Length of Stay Meetings, dates discussed:    Comments:  J. Reta Norgren,RN,BSN 1500 03/22/12 WILL FOLLOW FOR HOME NEEDS AS PT PROGRESSES.  Phone #[phone]

## 2012-03-22 NOTE — Progress Notes (Signed)
CSW received referral for pt with needs for medication and transportation assistance. Pt states there are no current concerns, as he now has medicaid and disability.   Pt plans to d/c home with mom and brother.   No other social work needs identified, therefore CSW not signing on.   Baxter Flattery, MSW [phone]

## 2012-03-22 NOTE — Progress Notes (Signed)
ANTICOAGULATION CONSULT NOTE - Follow Up Consult  Pharmacy Consult for heparin>>warfarin Indication: Saddle PE/LLE DVT  Allergies  Allergen Reactions  . Penicillins Hives    Patient Measurements: Height: 5\' 8"  (172.7 cm) Weight: 324 lb 4.8 oz (147.1 kg) IBW/kg (Calculated) : 68.4  Heparin Dosing Weight: 103kg  Vital Signs: Temp: 97.1 F (36.2 C) (04/15 1237) Temp src: Oral (04/15 1237) BP: 125/88 mmHg (04/15 1100) Pulse Rate: 84  (04/15 1100)  Labs:  Basename 03/22/12 0515 03/21/12 1903 03/21/12 1100 03/21/12 0029 03/21/12 0028 03/20/12 1649 03/20/12 1343  HGB 13.8 -- 15.8 -- -- -- --  HCT 41.7 -- 48.2 -- -- -- 46.7  PLT 45* -- 45* -- -- -- 57*  APTT -- -- -- -- -- -- --  LABPROT 16.2* -- -- -- 15.4* -- --  INR 1.27 -- -- -- 1.19 -- --  HEPARINUNFRC 0.45 0.47 0.98* -- -- -- --  CREATININE 0.68 -- 0.67 0.75 -- -- --  CKTOTAL -- -- 351* 339* -- 252* --  CKMB -- -- 5.7* 5.9* -- 4.4* --  TROPONINI -- -- 0.61* 1.24* -- 0.94* --   Estimated Creatinine Clearance: 166.5 ml/min (by C-G formula based on Cr of 0.68).   Assessment: 46 year old male with saddle PE and now dvt in LLE noted on ultrasound on overlap day#2/5 with heparin/warfarin. Heparin remains therapeutic on 1200 units/hr. INR rising to goal. No bleeding complications have been noted by nursing. Pltc remains low but stable.  Goal of Therapy:  INR 2-3 Heparin level 0.3-0.7 units/ml   Plan:  Continue IV heparin at 1200 units/hr Warfarin 10mg  po x1  Daily Heparin level, CBC, INR daily F/U HIT panel  Elson Clan 03/22/2012,1:04 PM

## 2012-03-23 LAB — CBC
HCT: 39.3 % (ref 39.0–52.0)
MCH: 31 pg (ref 26.0–34.0)
MCV: 93.8 fL (ref 78.0–100.0)
RDW: 14.1 % (ref 11.5–15.5)
WBC: 3.8 10*3/uL — ABNORMAL LOW (ref 4.0–10.5)

## 2012-03-23 LAB — GLUCOSE, CAPILLARY
Glucose-Capillary: 207 mg/dL — ABNORMAL HIGH (ref 70–99)
Glucose-Capillary: 246 mg/dL — ABNORMAL HIGH (ref 70–99)
Glucose-Capillary: 323 mg/dL — ABNORMAL HIGH (ref 70–99)

## 2012-03-23 LAB — BASIC METABOLIC PANEL
CO2: 23 mEq/L (ref 19–32)
Chloride: 105 mEq/L (ref 96–112)
Creatinine, Ser: 0.6 mg/dL (ref 0.50–1.35)
Sodium: 139 mEq/L (ref 135–145)

## 2012-03-23 LAB — PROTIME-INR: INR: 1.76 — ABNORMAL HIGH (ref 0.00–1.49)

## 2012-03-23 MED ORDER — DOCUSATE SODIUM 100 MG PO CAPS
100.0000 mg | ORAL_CAPSULE | Freq: Two times a day (BID) | ORAL | Status: DC
  Administered 2012-03-23 – 2012-03-25 (×5): 100 mg via ORAL
  Filled 2012-03-23 (×6): qty 1

## 2012-03-23 MED ORDER — SENNA 8.6 MG PO TABS
1.0000 | ORAL_TABLET | Freq: Every day | ORAL | Status: DC | PRN
  Filled 2012-03-23: qty 1

## 2012-03-23 MED ORDER — WARFARIN SODIUM 7.5 MG PO TABS
7.5000 mg | ORAL_TABLET | Freq: Once | ORAL | Status: AC
  Administered 2012-03-23: 7.5 mg via ORAL
  Filled 2012-03-23: qty 1

## 2012-03-23 MED ORDER — INSULIN ASPART 100 UNIT/ML ~~LOC~~ SOLN
10.0000 [IU] | Freq: Three times a day (TID) | SUBCUTANEOUS | Status: DC
  Administered 2012-03-23 – 2012-03-24 (×4): 10 [IU] via SUBCUTANEOUS

## 2012-03-23 MED ORDER — LIVING WELL WITH DIABETES BOOK
Freq: Once | Status: AC
  Administered 2012-03-23: 18:00:00
  Filled 2012-03-23: qty 1

## 2012-03-23 MED ORDER — HEPARIN (PORCINE) IN NACL 100-0.45 UNIT/ML-% IJ SOLN
1350.0000 [IU]/h | INTRAMUSCULAR | Status: DC
  Administered 2012-03-23 – 2012-03-24 (×2): 1350 [IU]/h via INTRAVENOUS
  Filled 2012-03-23 (×3): qty 250

## 2012-03-23 MED ORDER — BISACODYL 5 MG PO TBEC: 5.0000 mg | DELAYED_RELEASE_TABLET | Freq: Every day | ORAL | Status: DC | PRN

## 2012-03-23 MED ORDER — INSULIN PEN STARTER KIT
1.0000 | Freq: Once | Status: AC
  Administered 2012-03-24: 1
  Filled 2012-03-23: qty 1

## 2012-03-23 NOTE — Progress Notes (Signed)
ANTICOAGULATION CONSULT NOTE - Follow Up Consult  Pharmacy Consult for heparin>>warfarin Indication: Saddle PE/LLE DVT  Allergies  Allergen Reactions  . Penicillins Hives    Patient Measurements: Height: 5\' 8"  (172.7 cm) Weight: 325 lb 2.9 oz (147.5 kg) IBW/kg (Calculated) : 68.4  Heparin Dosing Weight: 103kg  Vital Signs: Temp: 98.6 F (37 C) (04/16 1200) Temp src: Oral (04/16 1200) BP: 128/76 mmHg (04/16 1200) Pulse Rate: 72  (04/16 0535)  Labs:  Basename 03/23/12 0422 03/22/12 1227 03/22/12 0515 03/21/12 1903 03/21/12 1100 03/21/12 0029 03/21/12 0028 03/20/12 1649  HGB 13.0 -- 13.8 -- -- -- -- --  HCT 39.3 -- 41.7 -- 48.2 -- -- --  PLT 44* 45* 45* -- -- -- -- --  APTT -- -- -- -- -- -- -- --  LABPROT 20.8* -- 16.2* -- -- -- 15.4* --  INR 1.76* -- 1.27 -- -- -- 1.19 --  HEPARINUNFRC 0.34 -- 0.45 0.47 -- -- -- --  CREATININE 0.60 -- 0.68 -- 0.67 -- -- --  CKTOTAL -- -- -- -- 351* 339* -- 252*  CKMB -- -- -- -- 5.7* 5.9* -- 4.4*  TROPONINI -- -- -- -- 0.61* 1.24* -- 0.94*   Estimated Creatinine Clearance: 166.7 ml/min (by C-G formula based on Cr of 0.6).   Assessment: 46 year old male with saddle PE and now dvt in LLE noted on ultrasound on overlap day#3/5 with heparin/warfarin. Heparin remains therapeutic on 1200 units/hr, but trending down a little. INR rising quickly today. No bleeding complications have been noted by nursing. Pltc remains low but stable.  Goal of Therapy:  INR 2-3 Heparin level 0.3-0.7 units/ml   Plan:  Increase IV heparin to 1350 units/hr to keep in goal range.  F/U AM heparin level. Warfarin 7.5mg  po x1  Daily Heparin level, CBC, INR daily F/U HIT panel  Vonette Grosso C 03/23/2012,3:38 PM

## 2012-03-23 NOTE — Progress Notes (Signed)
LB PCCM PROGRESS NOTE Name: EBENEZER MCCASKEY MRN: [id number] DOB: [birth year]    LOS: 3    History of Present Illness: 46 y/o male presented on 4/13 with recurrent massive pulmonary embolism.  Lines / Drains:   Cultures:   Antibiotics:   Tests / Events: 2D echo 4/13>>>> LVH, LVEF 45-50%; RV mildly dilated, Systolic PA pressure mildly increased LE [place] 4/13>>> Preliminary findings: Right= No evidence of DVT. Left= Evidence of DVT involving the popliteal vein. CT chest 4/13>>>Large acute pulmonary embolism spanning the main pulmonary arteries (saddle pulmonary embolism). Overall clot burden is severe and extends into the proximal branches of the left and right pulmonary arteries. RUQ Ultrasound 4/16 >> Hepatic steatosis, no spleen enlargement  Subjective: Feels well, but constipated  Vital Signs: BP 133/82  Pulse 72  Temp(Src) 98.4 F (36.9 C) (Oral)  Resp 18  Ht 5\' 8"  (1.727 m)  Wt 147.5 kg (325 lb 2.9 oz)  BMI 49.44 kg/m2  SpO2 94% 2 liters       . sodium chloride 20 mL/hr at 03/22/12 0600  . heparin 1,200 Units/hr (03/23/12 0100)     Intake/Output Summary (Last 24 hours) at 03/23/12 1139 Last data filed at 03/23/12 0300  Gross per 24 hour  Intake   1232 ml  Output    850 ml  Net    382 ml    Physical Examination:  Gen: awake, and alert, comfortable HEENT: NCAT, PERRL, OP clear PULM: CTA B CV: RRR, nl s1/s2, distant AB: bS+, soft, nontender Ext: warm, some edema in legs bilaterally L > R; DP pulses intact, sensation intact Neuro: A&Ox4, maew  Labs and Imaging:   Lab 03/23/12 0422 03/22/12 0515 03/21/12 1100  NA 139 136 142  K 3.6 3.6 4.0  CL 105 104 107  CO2 23 24 23   BUN 10 12 13   CREATININE 0.60 0.68 0.67  GLUCOSE 204* 262* 131*    Lab 03/23/12 0422 03/22/12 1227 03/22/12 0515 03/21/12 1100  HGB 13.0 -- 13.8 15.8  HCT 39.3 -- 41.7 48.2  WBC 3.8* -- 4.5 4.0  PLT 44* 45* 45* --   PCXR: Vascular congestion, basilar volume loss. No clear  infiltrates.  Assessment and Plan:  Saddle Pulmonary emboli: has had prior history of pulmonary emboli and deep vein thrombosis 6 years ago. Primary risk factors appear to be immobility and chronic venous stasis disease. He removed himself from anticoagulation 4 months ago: hemodynamically stable, minimal oxygen requirements. Plan -continue heparin infusion, heparin per pharmacy -Supplemental oxygen (weaned off 4/16) -He will now need lifelong warfarin therapy.  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; timing does not fit HIT (was low prior to heparin), f/u labs sent 4/15 ; LFT and ultrasound suggestive of hepatic steatosis  Lab 03/23/12 0422 03/22/12 1227 03/22/12 0515  PLT 44* 45* 45*  Plan:  -f/u HIT panel -continue heparin and warfarin for now -f/u anti-platelet antibody  Elevated LFT's: likely due to NASH Plan: -repeat LFT in AM  Hyponatremia: improved  Lab 03/23/12 0422 03/22/12 0515 03/21/12  1100  NA 139 136 142  Plan -bmet in am  Elevated troponins: most likely due to pulmonary emboli no chest pain Plan -Continue telemetry monitoring -Check EKG  Constipation: -Senna, colace, dulcolax  Best practices / Disposition: -->move to floor TRH service, PCCM consulting -->full code -->Heparin Drip -->Protonix for GI Px -->diet: po diet  Yolonda Kida PCCM Pager: [phone] If no response, call [phone]

## 2012-03-23 NOTE — Progress Notes (Signed)
Clinical Social Worker received referral for "other psychosocial needs".  CSW went to pt's room; pt currently in with Dietician.  CSW staffed case with RN who stated there were no immediate needs.  CSW to return and assess pt tomorrow.  Angelia Mould, MSW, [place] [phone]

## 2012-03-23 NOTE — Progress Notes (Signed)
Pt to be transferred to 4730.  Report given, and pt aware of room assignment.  Condition stable, no c/os.

## 2012-03-23 NOTE — Plan of Care (Signed)
Problem: Food- and Nutrition-Related Knowledge Deficit (NB-1.1) Goal: Nutrition education Formal process to instruct or train a patient/client in a skill or to impart knowledge to help patients/clients voluntarily manage or modify food choices and eating behavior to maintain or improve health.  Outcome: Completed/Met Date Met:  03/23/12 RD consulted for DM education for pt with new onset DM. RD provided pt with Dm nutrition education handout. RD went over types of foods that contain carbs, carb counting/servings, label reading, and meal planning. Pt willing to make changes to current diet. No additional questions at this time. RD encouraged pt to attend out patient nutrition and Diabetes classes for further diet education. Diet: Carb Mod medium, no meals documented yet. No nutrition problems indicated per nutrition risk screen.  Body mass index is 49.44 kg/(m^2). morbid obesity Lab Results  Component Value Date    HGBA1C 13.3* 03/22/2012    Please re-consult if additional nutrition needs arise.   Clarene Duke MARIE

## 2012-03-23 NOTE — Progress Notes (Signed)
45 YO AAM, morbidly obese w/ PMH of HTN, DM, Chronic venous stasis of left leg, and prior DVT and PE (took self off coumadin 5 months ago).   Results for KAINOA, SWOBODA (MRN [id number]) as of 03/23/2012 14:41  Ref. Range 03/22/2012 05:15  Hemoglobin A1C Latest Range: <5.7 % 13.3 (H)    Spoke with patient about his diabetes regimen at home.  Patient told me he was told he had "pre-diabetes" about 6 years ago in [place].  Has not been taking any medication to control his CBG levels.  Now lives here in [place].  Admits to increased thirst and urination this past month.  Never has had any formal training on diabetes before.  Spoke with pt about new diagnosis.  Discussed A1C results with him and explained what an A1C is, basic pathophysiology of DM Type 2, basic home care, importance of checking CBGs and maintaining good CBG control to prevent long-term and short-term complications.  Reviewed signs and symptoms of hyperglycemia and hypoglycemia.  RNs to provide ongoing basic DM education at bedside with this patient.  Have ordered educational booklet, insulin starter kit, and DM videos.  Patient told me he sees a physician at the [facility] here in [place].  Has Medicaid coverage.  Will likely need insulin at d/c based on his A1C of 13.3%.  Spoke with patient about this possibility.  Patient seemed hesitant when I mentioned insulin.  Will need thorough training during hospital stay.  RNs to provide insulin teaching at bedside.  MD- Please place order for "Consult Diabetes OP Education" so pt may attend follow-up session with Certified Diabetes Educator after d/c at the [facility].  MD, please make sure to give patient a Rx for CBG meter at d/c.  Patient does not have a CBG meter.  Will follow. Ambrose Finland RN, MSN, CDE Diabetes Coordinator Inpatient Diabetes Program [phone]

## 2012-03-24 LAB — CBC
HCT: 39.9 % (ref 39.0–52.0)
MCV: 92.8 fL (ref 78.0–100.0)
Platelets: 47 10*3/uL — ABNORMAL LOW (ref 150–400)
RBC: 4.3 MIL/uL (ref 4.22–5.81)
WBC: 3.6 10*3/uL — ABNORMAL LOW (ref 4.0–10.5)

## 2012-03-24 LAB — GLUCOSE, CAPILLARY
Glucose-Capillary: 187 mg/dL — ABNORMAL HIGH (ref 70–99)
Glucose-Capillary: 250 mg/dL — ABNORMAL HIGH (ref 70–99)

## 2012-03-24 LAB — COMPREHENSIVE METABOLIC PANEL
ALT: 137 U/L — ABNORMAL HIGH (ref 0–53)
AST: 52 U/L — ABNORMAL HIGH (ref 0–37)
Albumin: 3 g/dL — ABNORMAL LOW (ref 3.5–5.2)
CO2: 23 mEq/L (ref 19–32)
Calcium: 8.6 mg/dL (ref 8.4–10.5)
GFR calc non Af Amer: >90 mL/min (ref >90)
Sodium: 140 mEq/L (ref 135–145)

## 2012-03-24 LAB — PROTIME-INR
INR: 2.16 — ABNORMAL HIGH (ref 0.00–1.49)
Prothrombin Time: 24.5 seconds — ABNORMAL HIGH (ref 11.6–15.2)

## 2012-03-24 MED ORDER — WARFARIN SODIUM 7.5 MG PO TABS
7.5000 mg | ORAL_TABLET | Freq: Once | ORAL | Status: AC
  Administered 2012-03-24: 7.5 mg via ORAL
  Filled 2012-03-24: qty 1

## 2012-03-24 MED ORDER — INSULIN ASPART 100 UNIT/ML ~~LOC~~ SOLN
15.0000 [IU] | Freq: Three times a day (TID) | SUBCUTANEOUS | Status: DC
  Administered 2012-03-24 – 2012-03-25 (×4): 15 [IU] via SUBCUTANEOUS

## 2012-03-24 MED ORDER — INSULIN GLARGINE 100 UNIT/ML ~~LOC~~ SOLN
35.0000 [IU] | Freq: Two times a day (BID) | SUBCUTANEOUS | Status: DC
  Administered 2012-03-24 – 2012-03-25 (×2): 35 [IU] via SUBCUTANEOUS

## 2012-03-24 MED ORDER — POTASSIUM CHLORIDE CRYS ER 20 MEQ PO TBCR
40.0000 meq | EXTENDED_RELEASE_TABLET | Freq: Two times a day (BID) | ORAL | Status: AC
  Administered 2012-03-24 – 2012-03-25 (×3): 40 meq via ORAL
  Filled 2012-03-24 (×3): qty 2

## 2012-03-24 NOTE — Progress Notes (Signed)
ANTICOAGULATION CONSULT NOTE - Follow Up Consult  Pharmacy Consult for heparin, coumadin Indication: Saddle PE/LLE DVT  Allergies  Allergen Reactions  . Penicillins Hives    Patient Measurements: Height: 5\' 8"  (172.7 cm) Weight: 319 lb 12.8 oz (145.06 kg) (Scale C) IBW/kg (Calculated) : 68.4  Heparin Dosing Weight: 103kg  Vital Signs: Temp: 96.9 F (36.1 C) (04/17 0617) Temp src: Oral (04/17 0617) BP: 140/91 mmHg (04/17 0617) Pulse Rate: 83  (04/17 0617)  Labs:  Basename 03/24/12 0556 03/23/12 0422 03/22/12 1227 03/22/12 0515 03/21/12 1100  HGB 13.2 13.0 -- -- --  HCT 39.9 39.3 -- 41.7 --  PLT 47* 44* 45* -- --  APTT -- -- -- -- --  LABPROT 24.5* 20.8* -- 16.2* --  INR 2.16* 1.76* -- 1.27 --  HEPARINUNFRC 0.50 0.34 -- 0.45 --  CREATININE 0.60 0.60 -- 0.68 --  CKTOTAL -- -- -- -- 351*  CKMB -- -- -- -- 5.7*  TROPONINI -- -- -- -- 0.61*   Estimated Creatinine Clearance: 165.2 ml/min (by C-G formula based on Cr of 0.6).   Medications:  Infusions:    . sodium chloride 20 mL/hr at 03/23/12 1800  . heparin 1,350 Units/hr (03/23/12 2351)  . DISCONTD: heparin 1,200 Units/hr (03/23/12 1300)    Assessment: 45 yom with saddle PE and now LLE DVT noted on ultrasound on overlap day #4/5 with heparin + warfarin. Heparin remains therapeutic at 0.5. INR is also therapeutic at 2.16. No bleeding noted. Platelets remains low but stable. HIT panel is still in-process.   Goal of Therapy:  INR 2-3 Heparin level 0.3-0.7 units/ml   Plan:  1. Continue heparin at 1350units/hr 2. Coumadin 7.5mg  PO x 1 tonight 3. F/u AM heparin level and INR 4. F/u HIT panel  Wilfred Dayrit, Drake Leach 03/24/2012,8:18 AM

## 2012-03-24 NOTE — Progress Notes (Signed)
Inpatient Diabetes Program Recommendations  AACE/ADA: New Consensus Statement on Inpatient Glycemic Control (2009)  Target Ranges:  Prepandial:   less than 140 mg/dL      Peak postprandial:   less than 180 mg/dL (1-2 hours)      Critically ill patients:  140 - 180 mg/dL   Reason for Visit: Post-prandial hyperlgycemia  Inpatient Diabetes Program Recommendations Insulin - Basal: Increase Lantus to 35 units daily Insulin - Meal Coverage: Increase meal coverage to 15 units tidwc. Outpatient Referral: Please order outpatient diabetes education  Note: Thank you, Lenor Coffin, RN, CNS, Diabetes Coordinator [phone])

## 2012-03-24 NOTE — Progress Notes (Signed)
[facility] Progress Note  03/24/2012   Subjective: Pt without complaints.  No bleeding.    Objective:  Vital signs in last 24 hours: Filed Vitals:   03/23/12 1600 03/23/12 2100 03/24/12 0153 03/24/12 0617  BP: 136/75 130/76 151/89 140/91  Pulse:  74 84 83  Temp: 98.6 F (37 C) 98.2 F (36.8 C) 98.1 F (36.7 C) 96.9 F (36.1 C)  TempSrc: Oral Oral Oral Oral  Resp:  20 20 20   Height:      Weight:    145.06 kg (319 lb 12.8 oz)  SpO2:  94% 100% 95%   Weight change: -2.44 kg (-5 lb 6.1 oz)  Intake/Output Summary (Last 24 hours) at 03/24/12 1529 Last data filed at 03/24/12 1100  Gross per 24 hour  Intake 922.51 ml  Output      0 ml  Net 922.51 ml   Lab Results  Component Value Date   HGBA1C 13.3* 03/22/2012   Lab Results  Component Value Date   LDLCALC 132* 03/22/2012   CREATININE 0.60 03/24/2012    Review of Systems As above, otherwise all reviewed and reported negative  Physical Exam General - awake, no distress, cooperative HEENT - NCAT, MMM Lungs - BBS, CTA CV - normal s1, s2 sounds, no murmur Abd - soft, nondistended, no masses, nontender Ext - no C/C/E TED Hoses in place  Lab Results: Results for orders placed during the hospital encounter of 03/20/12 (from the past 24 hour(s))  GLUCOSE, CAPILLARY     Status: Abnormal   Collection Time   03/23/12  5:26 PM      Component Value Range   Glucose-Capillary 238 (*) 70 - 99 (mg/dL)   Comment 1 Notify RN     Comment 2 Documented in Chart    GLUCOSE, CAPILLARY     Status: Abnormal   Collection Time   03/23/12  8:58 PM      Component Value Range   Glucose-Capillary 323 (*) 70 - 99 (mg/dL)  CBC     Status: Abnormal   Collection Time   03/24/12  5:56 AM      Component Value Range   WBC 3.6 (*) 4.0 - 10.5 (K/uL)   RBC 4.30  4.22 - 5.81 (MIL/uL)   Hemoglobin 13.2  13.0 - 17.0 (g/dL)   HCT 16.1  09.6 - 04.5 (%)   MCV 92.8  78.0 - 100.0 (fL)   MCH 30.7  26.0 - 34.0 (pg)   MCHC 33.1  30.0 - 36.0 (g/dL)     RDW 40.9  81.1 - 91.4 (%)   Platelets 47 (*) 150 - 400 (K/uL)  HEPARIN LEVEL (UNFRACTIONATED)     Status: Normal   Collection Time   03/24/12  5:56 AM      Component Value Range   Heparin Unfractionated 0.50  0.30 - 0.70 (IU/mL)  PROTIME-INR     Status: Abnormal   Collection Time   03/24/12  5:56 AM      Component Value Range   Prothrombin Time 24.5 (*) 11.6 - 15.2 (seconds)   INR 2.16 (*) 0.00 - 1.49   COMPREHENSIVE METABOLIC PANEL     Status: Abnormal   Collection Time   03/24/12  5:56 AM      Component Value Range   Sodium 140  135 - 145 (mEq/L)   Potassium 3.2 (*) 3.5 - 5.1 (mEq/L)   Chloride 105  96 - 112 (mEq/L)   CO2 23  19 - 32 (mEq/L)  Glucose, Bld 169 (*) 70 - 99 (mg/dL)   BUN 9  6 - 23 (mg/dL)   Creatinine, Ser 1.61  0.50 - 1.35 (mg/dL)   Calcium 8.6  8.4 - 09.6 (mg/dL)   Total Protein 6.4  6.0 - 8.3 (g/dL)   Albumin 3.0 (*) 3.5 - 5.2 (g/dL)   AST 52 (*) 0 - 37 (U/L)   ALT 137 (*) 0 - 53 (U/L)   Alkaline Phosphatase 65  39 - 117 (U/L)   Total Bilirubin 0.4  0.3 - 1.2 (mg/dL)   GFR calc non Af Amer >90  >90 (mL/min)   GFR calc Af Amer >90  >90 (mL/min)  GLUCOSE, CAPILLARY     Status: Abnormal   Collection Time   03/24/12  6:21 AM      Component Value Range   Glucose-Capillary 187 (*) 70 - 99 (mg/dL)   Comment 1 Notify RN    GLUCOSE, CAPILLARY     Status: Abnormal   Collection Time   03/24/12 11:09 AM      Component Value Range   Glucose-Capillary 250 (*) 70 - 99 (mg/dL)   Comment 1 Notify RN     Comment 2 Documented in Chart      Micro Results: Recent Results (from the past 240 hour(s))  MRSA PCR SCREENING     Status: Abnormal   Collection Time   03/20/12  7:30 PM      Component Value Range Status Comment   MRSA by PCR POSITIVE (*) NEGATIVE  Final     Medications:  Scheduled Meds:   . Chlorhexidine Gluconate Cloth  6 each Topical Q0600  . docusate sodium  100 mg Oral BID  . Flexpen Starter Kit  1 kit Other Once  . insulin aspart  0-20 Units  Subcutaneous TID WC  . insulin aspart  0-5 Units Subcutaneous QHS  . insulin aspart  10 Units Subcutaneous TID WC  . insulin glargine  25 Units Subcutaneous BID  . living well with diabetes book   Does not apply Once  . mupirocin ointment  1 application Nasal BID  . pantoprazole  40 mg Oral Q1200  . warfarin  7.5 mg Oral ONCE-1800  . warfarin  7.5 mg Oral ONCE-1800  . warfarin   Does not apply Once  . Warfarin - Pharmacist Dosing Inpatient   Does not apply q1800   Continuous Infusions:   . sodium chloride 20 mL/hr at 03/23/12 1800  . heparin 1,350 Units/hr (03/23/12 2351)  . 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 Primary risk factors appear to be immobility and chronic venous stasis disease. He removed himself from anticoagulation 4 months ago: hemodynamically stable, minimal oxygen requirements.  Plan  -continue heparin infusion, heparin / coumadin per pharmacy (PT therapeutic) DC heparin after 24 hours -Supplemental oxygen (weaned off 4/16)  -He will now need lifelong warfarin therapy. -monitor platelets, holding stable, no active bleeding, DC heparin tomorrow.    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; timing does not fit HIT (was low prior to heparin), f/u labs sent 4/15 ; LFT and ultrasound suggestive of  hepatic steatosis   Lab  03/24/12 0556  03/23/12 0422  03/22/12 1227   PLT  47*  44*  45*   Plan:  -f/u HIT panel-->  -continue heparin and warfarin for now  -f/u anti-platelet antibody   Elevated LFT's: likely due to NASH  Plan:  -repeat LFT--improved   Hyponatremia: improved    Elevated troponins: most likely due to pulmonary emboli no chest pain  Plan  -Continue telemetry monitoring  -Check EKG   Constipation:  -Senna, colace, dulcolax    LOS: 4 days   Travis Rollins 03/24/2012, 3:29 PM   Cleora Fleet, MD, CDE, FAAFP [facility] [place], [place]  [phone]

## 2012-03-24 NOTE — Progress Notes (Signed)
[facility] PCCM PROGRESS NOTE Name: Travis Rollins MRN: [id number] DOB: [birth year]    LOS: 4    History of Present Illness: 46 y/o male presented on 4/13 with recurrent massive pulmonary embolism.     Tests / Events: 2D echo 4/13>>>> LVH, LVEF 45-50%; RV mildly dilated, Systolic PA pressure mildly increased LE [place] 4/13>>> Preliminary findings: Right= No evidence of DVT. Left= Evidence of DVT involving the popliteal vein. CT chest 4/13>>>Large acute pulmonary embolism spanning the main pulmonary arteries (saddle pulmonary embolism). Overall clot burden is severe and extends into the proximal branches of the left and right pulmonary arteries. RUQ Ultrasound 4/16 >> Hepatic steatosis, no spleen enlargement  Subjective: Feels well- no acute c/o's  Vital Signs: BP 140/91  Pulse 83  Temp(Src) 96.9 F (36.1 C) (Oral)  Resp 20  Ht 5\' 8"  (1.727 m)  Wt 319 lb 12.8 oz (145.06 kg)  BMI 48.63 kg/m2  SpO2 95% 2 liters       . sodium chloride 20 mL/hr at 03/23/12 1800  . heparin 1,350 Units/hr (03/23/12 2351)  . DISCONTD: heparin 1,200 Units/hr (03/23/12 1300)     Intake/Output Summary (Last 24 hours) at 03/24/12 1140 Last data filed at 03/24/12 1100  Gross per 24 hour  Intake 1426.51 ml  Output    600 ml  Net 826.51 ml    Physical Examination:  Gen: awake, and alert, comfortable HEENT: NCAT, PERRL, OP clear PULM: CTA B CV: RRR, nl s1/s2, distant AB: bS+, soft, nontender Ext: warm, some edema in legs bilaterally L > R; DP pulses intact, sensation intact Neuro: A&Ox4, maew  Labs and Imaging:   Lab 03/24/12 0556 03/23/12 0422 03/22/12 0515  NA 140 139 136  K 3.2* 3.6 3.6  CL 105 105 104  CO2 23 23 24   BUN 9 10 12   CREATININE 0.60 0.60 0.68  GLUCOSE 169* 204* 262*    Lab 03/24/12 0556 03/23/12 0422 03/22/12 1227 03/22/12 0515  HGB 13.2 13.0 -- 13.8  HCT 39.9 39.3 -- 41.7  WBC 3.6* 3.8* -- 4.5  PLT 47* 44* 45* --   PCXR: Vascular congestion, basilar volume loss. No  clear infiltrates.  Assessment and Plan:  Saddle Pulmonary emboli: has had prior history of pulmonary emboli and deep vein thrombosis 6 years ago. Primary risk factors appear to be immobility and chronic venous stasis disease. He removed himself from anticoagulation 4 months ago: hemodynamically stable, minimal oxygen requirements. Plan -continue heparin infusion, heparin / coumadin per pharmacy -Supplemental oxygen (weaned off 4/16) -He will now need lifelong warfarin therapy.  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; timing does not fit HIT (was low prior to heparin), f/u labs sent 4/15 ; LFT and ultrasound suggestive of hepatic steatosis  Lab 03/24/12 0556 03/23/12 0422 03/22/12 1227  PLT 47* 44* 45*  Plan:  -f/u HIT panel--> -continue heparin and warfarin for now -f/u anti-platelet antibody  Elevated LFT's: likely due to NASH Plan: -repeat LFT--improved Hyponatremia: improved  Lab 03/24/12 0556 03/23/12 0422 03/22/12 0515  NA 140 139  136  Plan -bmet in am  Elevated troponins: most likely due to pulmonary emboli no chest pain Plan -Continue telemetry monitoring -Check EKG  Constipation: -Senna, colace, dulcolax  Best practices / Disposition: -->move to floor TRH service, PCCM consulting -->full code -->Heparin Drip -->Protonix for GI Px -->diet: po diet   Canary Brim, NP-C [facility] Pgr: [phone]  Pt independently  seen and examined and available cxr's reviewed and I  agree with above findings/ imp/ plan   He is doing fine on heparin though share concern re low plt and would continue to monitor closely as you plan  As there are no further pulm/ccm issues will sign off and ask you to call us prn  Sandrea Hughs, MD Pulmonary and Critical Care Medicine [facility] Cell [phone]

## 2012-03-24 NOTE — Evaluation (Signed)
Physical Therapy Evaluation Patient Details Name: Travis Rollins MRN: [id number] DOB: 1966/01/23 Today's Date: 03/24/2012  Problem List:  Patient Active Problem List  Diagnoses  . Pulmonary emboli  . Morbid obesity  . Diabetes mellitus  . Hyperglycemia  . Thrombocytopenia  . Hyponatremia  . Leg pain  . Venous stasis  . Metabolic acidosis  . High anion gap metabolic acidosis    Past Medical History:  Past Medical History  Diagnosis Date  . Hypertension   . Deep vein thrombosis   . Obesity   . Diabetes mellitus   . Peripheral vascular disease     6 yrs ago, DVT in Lt knee/ groin  . Shortness of breath   . Anemia    Past Surgical History:  Past Surgical History  Procedure Date  . Tonsillectomy     PT Assessment/Plan/Recommendation PT Assessment Clinical Impression Statement: Pt is a 46 y/o male who presents with difficulty walking secondary to pain in Rt foot/ankle.  Acute PT willl follow to maximize pt functional potential    PT Recommendation/Assessment: Patient will need skilled PT in the acute care venue PT Problem List: Decreased mobility;Obesity;Pain Barriers to Discharge: None PT Therapy Diagnosis : Abnormality of gait;Acute pain PT Plan PT Frequency: Min 3X/week PT Treatment/Interventions: Gait training;DME instruction;Therapeutic activities;Therapeutic exercise;Patient/family education PT Recommendation Follow Up Recommendations: No PT follow up Equipment Recommended: Rolling walker with 5" wheels PT Goals  Acute Rehab PT Goals PT Goal Formulation: With patient Time For Goal Achievement: 7 days Pt will Ambulate: 51 - 150 feet;with modified independence;with least restrictive assistive device PT Goal: Ambulate - Progress: Goal set today  PT Evaluation Precautions/Restrictions  Precautions Precautions: Fall Restrictions Weight Bearing Restrictions: No Prior Functioning  Home Living Lives With: Family Available Help at Discharge:  Family;Available 24 hours/day Type of Home: Apartment Home Access: Level entry Home Layout: One level Bathroom Shower/Tub: Tub/shower unit;Curtain Firefighter: Standard Bathroom Accessibility: Yes How Accessible: Accessible via walker Prior Function Level of Independence: Independent Able to Take Stairs?: Yes Driving: No Vocation: On disability Cognition Cognition Arousal/Alertness: Awake/alert Overall Cognitive Status: History of cognitive impairments History of Cognitive Impairment: Appears at baseline functioning Sensation/Coordination Sensation Light Touch: Appears Intact Stereognosis: Not tested Coordination Gross Motor Movements are Fluid and Coordinated: Yes Extremity Assessment RUE Assessment RUE Assessment: Within Functional Limits LUE Assessment LUE Assessment: Within Functional Limits RLE Assessment RLE Assessment: Within Functional Limits LLE Assessment LLE Assessment: Within Functional Limits Mobility (including Balance) Bed Mobility Bed Mobility: No (pt sleeps in recliner at home. ) Transfers Transfers: Yes Sit to Stand: 5: Supervision;From chair/3-in-1;With upper extremity assist;With armrests Sit to Stand Details (indicate cue type and reason): supervision for safety no assistance required.  Stand to Sit: 5: Supervision;With upper extremity assist;To chair/3-in-1;With armrests Stand to Sit Details: No assistance or instruction required.  Ambulation/Gait Ambulation/Gait: Yes Ambulation/Gait Assistance: 5: Supervision Ambulation/Gait Assistance Details (indicate cue type and reason): supervision for safety pt present with antalgic gait secondary to Rt foot and ankle pain.  Ambulation Distance (Feet): 60 Feet Assistive device: None Gait Pattern: Antalgic Stairs: No Wheelchair Mobility Wheelchair Mobility: No  Posture/Postural Control Posture/Postural Control: No significant limitations Balance Balance Assessed: Yes Static Sitting Balance Static  Sitting - Balance Support: Feet supported;No upper extremity supported Static Sitting - Level of Assistance: 7: Independent Static Sitting - Comment/# of Minutes: several minutes while performing ADLs.  Exercise    End of Session PT - End of Session Equipment Utilized During Treatment: Gait belt Activity Tolerance: Patient tolerated treatment  well Patient left: in chair;with call bell in reach Nurse Communication: Mobility status for transfers;Mobility status for ambulation General Behavior During Session: [facility] for tasks performed Cognition: [facility] for tasks performed  Travis Rollins 03/24/2012, 5:37 PM Jonathen Rathman L. Brody Bonneau DPT [phone]

## 2012-03-24 NOTE — Progress Notes (Signed)
Utilization Review Completed.Nasia Cannan T4/17/2013   

## 2012-03-25 LAB — HEPARIN LEVEL (UNFRACTIONATED): Heparin Unfractionated: 0.52 IU/mL (ref 0.30–0.70)

## 2012-03-25 LAB — CBC
Hemoglobin: 13.4 g/dL (ref 13.0–17.0)
MCHC: 33.5 g/dL (ref 30.0–36.0)
Platelets: 55 10*3/uL — ABNORMAL LOW (ref 150–400)

## 2012-03-25 LAB — GLUCOSE, CAPILLARY: Glucose-Capillary: 122 mg/dL — ABNORMAL HIGH (ref 70–99)

## 2012-03-25 LAB — PROTIME-INR: Prothrombin Time: 27.8 seconds — ABNORMAL HIGH (ref 11.6–15.2)

## 2012-03-25 MED ORDER — WARFARIN SODIUM 5 MG PO TABS
5.0000 mg | ORAL_TABLET | Freq: Once | ORAL | Status: AC
  Administered 2012-03-25: 5 mg via ORAL
  Filled 2012-03-25: qty 1

## 2012-03-25 MED ORDER — DSS 100 MG PO CAPS: 100.0000 mg | ORAL_CAPSULE | Freq: Two times a day (BID) | ORAL | Status: AC

## 2012-03-25 MED ORDER — FREESTYLE SYSTEM KIT: 1.0000 | PACK | Status: AC | PRN

## 2012-03-25 MED ORDER — INSULIN GLARGINE 100 UNIT/ML ~~LOC~~ SOLN: 30.0000 [IU] | Freq: Every day | SUBCUTANEOUS | Status: DC

## 2012-03-25 MED ORDER — INSULIN ASPART 100 UNIT/ML ~~LOC~~ SOLN: 10.0000 [IU] | Freq: Three times a day (TID) | SUBCUTANEOUS | Status: DC

## 2012-03-25 MED ORDER — WARFARIN SODIUM 5 MG PO TABS: 5.0000 mg | ORAL_TABLET | Freq: Every day | ORAL | Status: DC

## 2012-03-25 NOTE — Progress Notes (Signed)
Notified of pt's HR in 130s.  RN checked on pt and pt up at sink cleaning up. HR decreased when pt returned to chair. Pt high fall risk. Pt educated about importance of calling staff when needing assistance. Pt states understanding. Pt left in chair with call bell within reach.

## 2012-03-25 NOTE — Progress Notes (Signed)
ANTICOAGULATION CONSULT NOTE - Follow Up Consult  Pharmacy Consult for heparin, coumadin Indication: Saddle PE/LLE DVT  Allergies  Allergen Reactions  . Penicillins Hives    Patient Measurements: Height: 5\' 8"  (172.7 cm) Weight: 319 lb 12.8 oz (145.06 kg) (c scale) IBW/kg (Calculated) : 68.4  Heparin Dosing Weight: 103kg  Vital Signs: Temp: 98.1 F (36.7 C) (04/18 0449) Temp src: Oral (04/18 0449) BP: 135/88 mmHg (04/18 0449) Pulse Rate: 80  (04/18 0449)  Labs:  Basename 03/25/12 0645 03/24/12 0556 03/23/12 0422  HGB 13.4 13.2 --  HCT 40.0 39.9 39.3  PLT 55* 47* 44*  APTT -- -- --  LABPROT 27.8* 24.5* 20.8*  INR 2.54* 2.16* 1.76*  HEPARINUNFRC 0.52 0.50 0.34  CREATININE -- 0.60 0.60  CKTOTAL -- -- --  CKMB -- -- --  TROPONINI -- -- --   Estimated Creatinine Clearance: 165.2 ml/min (by C-G formula based on Cr of 0.6).   Medications:  Infusions:     . sodium chloride 20 mL/hr at 03/23/12 1800  . heparin 1,350 Units/hr (03/24/12 2025)    Assessment: 45 yom with saddle PE and now LLE DVT noted on ultrasound on overlap day #5/5 with heparin + warfarin. Heparin level and INR therapeutic this am. No bleeding noted. Platelets remains low but stable. HIT panel is still in-process.   Spoke w/ MD and received orders to d/c heparin this am.  Goal of Therapy:  INR 2-3 Heparin level 0.3-0.7 units/ml   Plan:  -Warfarin 5 mg po x1 tonight -d/c heparin and levels per discussion with MD -f/u am INR  Jill Side L. Illene Bolus, PharmD, BCPS Clinical Pharmacist Pager: [phone] 03/25/2012 10:53 AM

## 2012-03-25 NOTE — Progress Notes (Signed)
   CARE MANAGEMENT NOTE 03/25/2012  Patient:  Travis Rollins, Travis Rollins   Account Number:  [id number]  Date Initiated:  03/22/2012  Documentation initiated by:  AMERSON,JULIE  Subjective/Objective Assessment:   PT ADM WITH MASSIVE PULMONARY EMBOLUS; CURRENTLY ON HEPARIN DRIP.     Action/Plan:   PTA, PT LIVES WITH MOTHER.  PT TO DC ON COUMADIN, A $4 GENERIC.  HE STATES HE WILL HAVE NO PROBLEM OBTAINING MEDS AT DISCHARGE, AS HE HAS MEDICAID.   Anticipated DC Date:  03/25/2012   Anticipated DC Plan:  HOME/SELF CARE  In-house referral  Clinical Social Worker         Choice offered to / List presented to:     DME arranged  Levan Hurst      DME agency  [facility]        Status of service:  In process, will continue to follow Medicare Important Message given?   (If response is "NO", the following Medicare IM given date fields will be blank) Date Medicare IM given:   Date Additional Medicare IM given:    Discharge Disposition:  HOME W HOME HEALTH SERVICES  Per UR Regulation:    If discussed at Long Length of Stay Meetings, dates discussed:    Comments:  03/25/12 Onnie Boer, RN, BSN 1634 PT IS TO DC TO HOME WITH SELF CARE AND F/U WITH HIS PCP FOR A PT/INR CHECK ON TOMORROW.  PT IS GOING HOME WITH A RW FROM [facility].  PT STATED THAT HE CAN AFFORD HIS MEDS, FAMILY WAS ALSO INFORMED OF THESE DC NEEDS.  J. AMERSON,RN,BSN 1500 03/22/12 WILL FOLLOW FOR HOME NEEDS AS PT PROGRESSES.

## 2012-03-25 NOTE — Progress Notes (Signed)
Clinical Social Work Department BRIEF PSYCHOSOCIAL ASSESSMENT 03/25/2012  Patient:  Travis Rollins,Travis Rollins     Account Number:  [id number]     Admit date:  01/12/2012  Clinical Social Worker:  Dennison Bulla  Date/Time:  03/25/2012 10:45 AM  Referred by:  Physician  Date Referred:  03/25/2012 Referred for  Transportation assistance   Other Referral:   Interview type:  Patient Other interview type:    PSYCHOSOCIAL DATA Living Status:  FAMILY Admitted from facility:   Level of care:   Primary support name:  Loraine Leriche Primary support relationship to patient:  SIBLING Degree of support available:   Adequate    CURRENT CONCERNS Current Concerns  Other - See comment   Other Concerns:   Transportation    SOCIAL WORK ASSESSMENT / PLAN CSW received referral to assist with transporation. CSW met with patient at bedside. Patient had a SCAT application already printed with him and said that his family was told about SCAT and he needed MD to fill out the healthcare profession section. CSW informed patient that CSW could perform this task and completed application. Patient stated that brother would assist him in completing his part of the application and would take application to [employer]. CSW spoke with patient regarding TAMS [facility] Transportation) and left information for patient. Patient can complete assessment via phone for TAMS. CSW encouraged patient to call while in the hospital. CSW is signing off but available if needed.   Assessment/plan status:  No Further Intervention Required Other assessment/ plan:   Information/referral to community resources:   TAMS, SCAT    PATIENT'S/FAMILY'S RESPONSE TO PLAN OF CARE: Patient was alert and oriented. Patient was appreciative of CSW consult. Patient stated he will talk with family about TAMS and SCAT. Patient reportd no further needs.

## 2012-03-25 NOTE — Discharge Summary (Signed)
HOSPITAL DISCHARGE SUMMARY   @n   Travis Rollins, 46 y.o., DOB 12/25/1965  Admission date: 03/20/2012 Discharge Date: 03/25/2012  Primary MD Dr. Zipporah Plants  Admitting Physician [name], MD  Admission Diagnosis  Pulmonary embolism [415.19] Tachycardia [785.0] Hyperglycemia [790.29] PE  Discharge Diagnoses:   No resolved problems to display.  Active Hospital Problems  Diagnoses Date Noted   . Pulmonary emboli 03/20/2012   . Morbid obesity 03/20/2012   . Diabetes mellitus 03/20/2012   . Hyperglycemia 03/20/2012   . Thrombocytopenia 03/20/2012   . Hyponatremia 03/20/2012   . Leg pain 03/20/2012   . Venous stasis 03/20/2012   . Metabolic acidosis 03/20/2012   . High anion gap metabolic acidosis 03/20/2012     Resolved Hospital Problems  Diagnoses Date Noted Date Resolved    Past Medical History  Diagnosis Date  . Hypertension   . Deep vein thrombosis   . Obesity   . Diabetes mellitus   . Peripheral vascular disease     6 yrs ago, DVT in Lt knee/ groin  . Shortness of breath   . Anemia     Past Surgical History  Procedure Date  . Tonsillectomy     Consults  PCCM service  Significant Tests:  See full reports for all details   *RADIOLOGY REPORT*  Clinical Data: Cirrhosis. Elevated liver function tests.  COMPLETE ABDOMINAL ULTRASOUND  Comparison: CT scan of the chest dated 03/20/2012  Findings:  Gallbladder: No gallstones, gallbladder wall thickening, or  pericholecystic fluid. Negative sonographic Murphy's sign.  Common bile duct: 4 mm in diameter, normal.  Liver: Echogenic liver parenchyma consistent with hepatic  steatosis. No focal lesions.  IVC: Not visualized.  Pancreas: Not visualized.  Spleen: Normal. 6.8 cm in length.  Right Kidney: Normal. 12.8 cm in length.  Left Kidney: Normal. 11.8 cm in length.  Abdominal aorta: Proximal abdominal aorta is 2.3 cm in diameter.  Distal aorta is obscured by bowel.  IMPRESSION:  1. Hepatic steatosis.    2. No other visible abnormalities. Several structures were not  visualized, as described above.  3. No ascites.    Hospital Course See H&P, Labs, Consult and Test reports for all details. In brief, this patient was admitted for recurrent massive pulmonary embolus. 45 YO AAM, morbidly obese w/ PMH of HTN, DM, Chronic venous stasis of left leg, and prior DVT and PE (took self off coumadin 5 months ago). Approx 2 days prior to admit had what he though was muscle strain in Left posterior leg. Was walking to store on 4/13 and had sudden sharp grabbing sensation in his chest. This is similar to the presentation and his last pulmonary emboli 6 years ago. He he recalled this was followed by extreme shortness of breath, he then apparently had a syncopal episode. On EMS arrival he was hypotensive but responded to IVFs. Dx eval demonstrated Massive Pulmonary emboli.   Pt was started on heparin drip and continued it for full 5 days and coumadin was restarted and had been therapeutic for 24 hours prior to discontinuing the IV heparin infusion.  Pt was started on insulin to treat poorly controlled type 2 DM with high A1c value.  Pt will be sent home with rx for glucose meter and for amb diabetes education appt.  Pt will need to have Pt /INR recheck tomorrow with PCP.  Pt says that his sister will take him to the appointment.  Pt did very well.  He was considered stable for discharge home when  he was adequately anticoagulated.  He will need close follow up.  Also, wrote for him to have a rolling walker.       No resolved problems to display.  Active Hospital Problems  Diagnoses Date Noted   . Pulmonary emboli 03/20/2012   . Morbid obesity 03/20/2012   . Diabetes mellitus 03/20/2012   . Hyperglycemia 03/20/2012   . Thrombocytopenia 03/20/2012   . Hyponatremia 03/20/2012   . Leg pain 03/20/2012   . Venous stasis 03/20/2012   . Metabolic acidosis 03/20/2012   . High anion gap metabolic acidosis 03/20/2012      Resolved Hospital Problems  Diagnoses Date Noted Date Resolved     Today's Assessment:   Subjective:   Travis Rollins Pt sitting up in no distress, ambulating.    Objective:   Blood pressure 135/88, pulse 80, temperature 98.1 F (36.7 C), temperature source Oral, resp. rate 20, height 5\' 8"  (1.727 m), weight 145.06 kg (319 lb 12.8 oz), SpO2 94.00%.  Intake/Output Summary (Last 24 hours) at 03/25/12 1249 Last data filed at 03/25/12 1100  Gross per 24 hour  Intake    814 ml  Output    925 ml  Net   -111 ml    Exam General - awake, alert, no distress Lungs - BBS clear CV - normal s1, s2 sounds Abd - obese, soft, nondistended, nontender  Ext- TED hoses bilateral Neuro - nonfocal   Lab Results  Component Value Date   WBC 3.8* 03/25/2012   HGB 13.4 03/25/2012   HCT 40.0 03/25/2012   PLT 55* 03/25/2012   LYMPHOPCT 51* 03/20/2012   MONOPCT 6 03/20/2012   EOSPCT 1 03/20/2012   BASOPCT 0 03/20/2012   CMP: Lab Results  Component Value Date   NA 140 03/24/2012   K 3.2* 03/24/2012   CL 105 03/24/2012   CO2 23 03/24/2012   BUN 9 03/24/2012   CREATININE 0.60 03/24/2012   PROT 6.4 03/24/2012   ALBUMIN 3.0* 03/24/2012   BILITOT 0.4 03/24/2012   ALKPHOS 65 03/24/2012   AST 52* 03/24/2012   ALT 137* 03/24/2012  .  Discharge Instructions     Please see DC AVS form.   DISCHARGE MEDICATION: Medication List  As of 03/25/2012 12:49 PM   TAKE these medications         DSS 100 MG Caps   Take 100 mg by mouth 2 (two) times daily.      insulin aspart 100 UNIT/ML injection   Commonly known as: novoLOG   Inject 10 Units into the skin 3 (three) times daily with meals.      insulin glargine 100 UNIT/ML injection   Commonly known as: LANTUS   Inject 30 Units into the skin at bedtime.      naproxen sodium 220 MG tablet   Commonly known as: ANAPROX   Take 660 mg by mouth every 12 (twelve) hours. For tendonitis      warfarin 5 MG tablet   Commonly known as: COUMADIN   Take 1 tablet (5  mg total) by mouth daily.            Disposition and Follow-up: PCP tomorrow Dr. Eula Listen for PT/INR test Please follow up with outpatient diabetes education appointment.   Discharge Orders    Future Orders Please Complete By Expires   Diet - low sodium heart healthy      Increase activity slowly      Discharge instructions      Comments:  Please check blood sugars 4 times per day.  Go to pharmacy and get a blood sugar meter and testing strips with supplies.  Please follow up with diabetes education appointment.   Please see your primary care provider tomorrow to get your coumadin PT/INR checked! Very important.   Rolling walker to use as needed for stability.   Return if symptoms recur, worsen or new problems develop.   Report your blood sugars to your primary care provider.   Call MD for:  persistant nausea and vomiting      Call MD for:  extreme fatigue      Call MD for:  persistant dizziness or light-headedness      Call MD for:  difficulty breathing, headache or visual disturbances         I called and spoke with pt's brother Minerva Areola to confirm that pt would have support at home for his medical care needs.  Minerva Areola confirmed to me that they would be assisting pt to all of his office visit and lab visits and helping him to monitor  His blood sugars and making sure that he is taking his warfarin as prescribed.     The risks, benefits, and possible side effects of all treatments and tests were explained to the patient.  The patient verbalized understanding.  The importance of close follow up with the primary care medical provider was explained clearly to the patient.  The patient verbalized understanding.  The patient was given instructions to return if symptoms recur, worsen or new changes develop.  The patient verbalized understanding.   Cleora Fleet, MD, CDE, FAAFP [facility] [place], [place]   Total Time spent reviewing critical document,  reviewing this patient's comprehensive hospitalization, arranging follow up and coordination of care, reviewing data and todays exam greater than 42 minutes.   SignedStandley Dakins 03/25/2012 12:49 PM

## 2012-03-25 NOTE — Progress Notes (Signed)
IV d/c'ed. Tele d/c'ed. Pt's sister to pick pt up. Pt's sister refused to come up to floor to listen to d/c instructions although strongly encouraged. RN stated importance of having person other than pt present at d/c. Pt's sister requested information over the phone. RN expressed importance of taking medications as prescribed, going to follow up appt in AM, and the medications pt would be taking. RN educated pt on medications, blood sugar monitoring, and importance of going to follow up appt. Pt able to state back when blood sugar should be checked, where to obtain meds, and ways to prevent further PEs. Prescriptions faxed to [facility] used by pt. All questions answered. Pt states understanding. Pt escorted out with staff via w/c. Pt d/c'ed to home.

## 2012-03-25 NOTE — Discharge Instructions (Signed)
Blood Sugar Monitoring, Adult GLUCOSE METERS FOR SELF-MONITORING OF BLOOD GLUCOSE  It is important to be able to correctly measure your blood sugar (glucose). You can use a blood glucose monitor (a small battery-operated device) to check your glucose level at any time. This allows you and your caregiver to monitor your diabetes and to determine how well your treatment plan is working. The process of monitoring your blood glucose with a glucose meter is called self-monitoring of blood glucose (SMBG). When people with diabetes control their blood sugar, they have better health. To test for glucose with a typical glucose meter, place the disposable strip in the meter. Then place a small sample of blood on the "test strip." The test strip is coated with chemicals that combine with glucose in blood. The meter measures how much glucose is present. The meter displays the glucose level as a number. Several new models can record and store a number of test results. Some models can connect to personal computers to store test results or print them out.  Newer meters are often easier to use than older models. Some meters allow you to get blood from places other than your fingertip. Some new models have automatic timing, error codes, signals, or barcode readers to help with proper adjustment (calibration). Some meters have a large display screen or spoken instructions for people with visual impairments.  INSTRUCTIONS FOR USING GLUCOSE METERS  Wash your hands with soap and warm water, or clean the area with alcohol. Dry your hands completely.   Prick the side of your fingertip with a lancet (a sharp-pointed tool used by hand).   Hold the hand down and gently milk the finger until a small drop of blood appears. Catch the blood with the test strip.   Follow the instructions for inserting the test strip and using the SMBG meter. Most meters require the meter to be turned on and the test strip to be inserted before  applying the blood sample.   Record the test result.   Read the instructions carefully for both the meter and the test strips that go with it. Meter instructions are found in the user manual. Keep this manual to help you solve any problems that may arise. Many meters use "error codes" when there is a problem with the meter, the test strip, or the blood sample on the strip. You will need the manual to understand these error codes and fix the problem.   New devices are available such as laser lancets and meters that can test blood taken from "alternative sites" of the body, other than fingertips. However, you should use standard fingertip testing if your glucose changes rapidly. Also, use standard testing if:   You have eaten, exercised, or taken insulin in the past 2 hours.   You think your glucose is low.   You tend to not feel symptoms of low blood glucose (hypoglycemia).   You are ill or under stress.   Clean the meter as directed by the manufacturer.   Test the meter for accuracy as directed by the manufacturer.   Take your meter with you to your caregiver's office. This way, you can test your glucose in front of your caregiver to make sure you are using the meter correctly. Your caregiver can also take a sample of blood to test using a routine lab method. If values on the glucose meter are close to the lab results, you and your caregiver will see that your meter is working well  and you are using good technique. Your caregiver will advise you about what to do if the results do not match.  FREQUENCY OF TESTING  Your caregiver will tell you how often you should check your blood glucose. This will depend on your type of diabetes, your current level of diabetes control, and your types of medicines. The following are general guidelines, but your care plan may be different. Record all your readings and the time of day you took them for review with your caregiver.   Diabetes type 1.   When you  are using insulin with good diabetic control (either multiple daily injections or via a pump), you should check your glucose 4 times a day.   If your diabetes is not well controlled, you may need to monitor more frequently, including before meals and 2 hours after meals, at bedtime, and occasionally between 2 a.m. and 3 a.m.   You should always check your glucose before a dose of insulin or before changing the rate on your insulin pump.   Diabetes type 2.   Guidelines for SMBG in diabetes type 2 are not as well defined.   If you are on insulin, follow the guidelines above.   If you are on medicines, but not insulin, and your glucose is not well controlled, you should test at least twice daily.   If you are not on insulin, and your diabetes is controlled with medicines or diet alone, you should test at least once daily, usually before breakfast.   A weekly profile will help your caregiver advise you on your care plan. The week before your visit, check your glucose before a meal and 2 hours after a meal at least daily. You may want to test before and after a different meal each day so you and your caregiver can tell how well controlled your blood sugars are throughout the course of a 24 hour period.   Gestational diabetes (diabetes during pregnancy).   Frequent testing is often necessary. Accurate timing is important.   If you are not on insulin, check your glucose 4 times a day. Check it before breakfast and 1 hour after the start of each meal.   If you are on insulin, check your glucose 6 times a day. Check it before each meal and 1 hour after the first bite of each meal.   General guidelines.   More frequent testing is required at the start of insulin treatment. Your caregiver will instruct you.   Test your glucose any time you suspect you have low blood sugar (hypoglycemia).   You should test more often when you change medicines, when you have unusual stress or illness, or in other  unusual circumstances.  OTHER THINGS TO KNOW ABOUT GLUCOSE METERS  Measurement Range. Most glucose meters are able to read glucose levels over a broad range of values from as low as 0 to as high as 600 mg/dL. If you get an extremely high or low reading from your meter, you should first confirm it with another reading. Report very high or very low readings to your caregiver.   Whole Blood Glucose versus Plasma Glucose. Some older home glucose meters measure glucose in your whole blood. In a lab or when using some newer home glucose meters, the glucose is measured in your plasma (one component of blood). The difference can be important. It is important for you and your caregiver to know whether your meter gives its results as "whole blood equivalent" or "plasma  equivalent."   Display of High and Low Glucose Values. Part of learning how to operate a meter is understanding what the meter results mean. Know how high and low glucose concentrations are displayed on your meter.   Factors that Affect Glucose Meter Performance. The accuracy of your test results depends on many factors and varies depending on the brand and type of meter. These factors include:   Low red blood cell count (anemia).   Substances in your blood (such as uric acid, vitamin C, and others).   Environmental factors (temperature, humidity, altitude).   Name-brand versus generic test strips.   Calibration. Make sure your meter is set up properly. It is a good idea to do a calibration test with a control solution recommended by the manufacturer of your meter whenever you begin using a fresh bottle of test strips. This will help verify the accuracy of your meter.   Improperly stored, expired, or defective test strips. Keep your strips in a dry place with the lid on.   Soiled meter.   Inadequate blood sample.  NEW TECHNOLOGIES FOR GLUCOSE TESTING Alternative site testing Some glucose meters allow testing blood from alternative  sites. These include the:  Upper arm.   Forearm.   Base of the thumb.   Thigh.  Sampling blood from alternative sites may be desirable. However, it may have some limitations. Blood in the fingertips show changes in glucose levels more quickly than blood in other parts of the body. This means that alternative site test results may be different from fingertip test results, not because of the meter's ability to test accurately, but because the actual glucose concentration can be different.  Continuous Glucose Monitoring Devices to measure your blood glucose continuously are available, and others are in development. These methods can be more expensive than self-monitoring with a glucose meter. However, it is uncertain how effective and reliable these devices are. Your caregiver will advise you if this approach makes sense for you. IF BLOOD SUGARS ARE CONTROLLED, PEOPLE WITH DIABETES REMAIN HEALTHIER.  SMBG is an important part of the treatment plan of patients with diabetes mellitus. Below are reasons for using SMBG:   It confirms that your glucose is at a specific, healthy level.   It detects hypoglycemia and severe hyperglycemia.   It allows you and your caregiver to make adjustments in response to changes in lifestyle for individuals requiring medicine.   It determines the need for starting insulin therapy in temporary diabetes that happens during pregnancy (gestational diabetes).  Document Released: 11/27/2003 Document Revised: 11/13/2011 Document Reviewed: 03/20/2011 [facility] Patient Information 2012 [place], [place].  Pulmonary Embolus A pulmonary (lung) embolus (PE) is a blood clot that has traveled from another place in the body to the lung. Most clots come from deep veins in the legs or pelvis. PE is a dangerous and potentially life-threatening condition that can be treated if identified. CAUSES Blood clots form in a vein for different reasons. Usually several things cause blood  clots. They include:  The flow of blood slows down.   The inside of the vein is damaged in some way.   The person has a condition that makes the blood clot more easily. These conditions may include:   Older age (especially over 70 years old).   Having a history of blood clots.   Having major or lengthy surgery. Hip surgery is particularly high-risk.   Breaking a hip or leg.   Sitting or lying still for a long time.  Cancer or cancer treatment.   Having a long, thin tube (catheter) placed inside a vein during a medical procedure.   Being overweight (obese).   Pregnancy and childbirth.   Medicines with estrogen.   Smoking.   Other circulation or heart problems.  SYMPTOMS  The symptoms of a PE usually start suddenly and include:  Shortness of breath.   Coughing.   Coughing up blood or blood-tinged mucus (phlegm).   Chest pain. Pain is often worse with deep breaths.   Rapid heartbeat.  DIAGNOSIS  If a PE is suspected, your caregiver will take a medical history and carry out a physical exam. Your caregiver will check for the risk factors listed above. Tests that also may be required include:  Blood tests, including studies of the clotting properties of your blood.   Imaging tests. Ultrasound, CT, MRI, and other tests can all be used to see if you have clots in your legs or lungs. If you have a clot in your legs and have breathing or chest problems, your caregiver may conclude that you have a clot in your lungs. Further lung tests may not be needed.   An EKG can look for heart strain from blood clots in the lungs.  PREVENTION   Exercise the legs regularly. Take a brisk 30 minute walk every day.   Maintain a weight that is appropriate for your height.   Avoid sitting or lying in bed for long periods of time without moving your legs.   Women, particularly those over the age of 20, should consider the risks and benefits of taking estrogen medicines, including birth  control pills.   Do not smoke, especially if you take estrogen medicines.   Long-distance travel can increase your risk. You should exercise your legs by walking or pumping the muscles every hour.   In hospital prevention:   Your caregiver will assess your need for preventive PE care (prophylaxis) when you are admitted to the hospital. If you are having surgery, your surgeon will assess you the day of or day after surgery.   Prevention may include medical and nonmedical measures.  TREATMENT   The most common treatment for a PE is blood thinning (anticoagulant) medicine, which reduces the blood's tendency to clot. Anticoagulants can stop new blood clots from forming and old ones from growing. They cannot dissolve existing clots. Your body does this by itself over time. Anticoagulants can be given by mouth, by intravenous (IV) access, or by injection. Your caregiver will determine the best program for you.   Less commonly, clot-dissolving drugs (thrombolytics) are used to dissolve a PE. They carry a high risk of bleeding, so they are used mainly in severe cases.   Very rarely, a blood clot in the leg needs to be removed surgically.   If you are unable to take anticoagulants, your caregiver may arrange for you to have a filter placed in a main vein in your belly (abdomen). This filter prevents clots from traveling to your lungs.  HOME CARE INSTRUCTIONS   Take all medicines prescribed by your caregiver. Follow the directions carefully.   You will most likely continue taking anticoagulants after you leave the hospital. Your caregiver will advise you on the length of treatment (usually 3 to 6 months, sometimes for life).   Taking too much or too little of an anticoagulant is dangerous. While taking this type of medicine, you will need to have regular blood tests to be sure the dose is correct. The dose  can change for many reasons. It is critically important that you take this medicine exactly as  prescribed and that you have blood tests exactly as directed.   Many foods can interfere with anticoagulants. These include foods high in vitamin K, such as spinach, kale, broccoli, cabbage, collard and turnip greens, Brussels sprouts, peas, cauliflower, seaweed, parsley, beef and pork liver, green tea, and soybean oil. Your caregiver should discuss limits on these foods with you or you should arrange a visit with a dietician to answer your questions.   Many medicines can interfere with anticoagulants. You must tell your caregiver about any and all medicines you take. This includesall vitamins and supplements. Be especially cautious with aspirin and anti-inflammatory medicines. Ask your caregiver before taking these.   Anticoagulants can have side effects, mostly excessive bruising or bleeding. You will need to hold pressure over cuts for longer than usual. Avoid alcoholic drinks or consume only very small amounts while taking this medicine.   If you are taking an anticoagulant:   Wear a medical alert bracelet.   Notify your dentist or other caregivers before procedures.   Avoid contact sports.   Ask your caregiver how soon you can go back to normal activities. Not being active can lead to new clots. Ask for a list of what you should and should not do.   Exercise your lower leg muscles. This is important while traveling.   You may need to wear compression stockings. These are tight elastic stockings that apply pressure to the lower legs. This can help keep the blood in the legs from clotting.   If you are a smoker, you should quit.   Learn as much as you can about pulmonary embolisms.  SEEK MEDICAL CARE IF:   You notice a rapid heartbeat.   You feel weaker or more tired than usual.   You feel faint.   You notice increased bruising.   Your symptoms are not getting better in the time expected.   You are having side effects of medicine.   You have an oral temperature above 102 F  (38.9 C).   You discover other family members with blood clots. This may require further testing for inherited diseases or conditions.  SEEK IMMEDIATE MEDICAL CARE IF:   You have chest pain.   You have trouble breathing.   You have new or increased swelling or pain in one leg.   You cough up blood.   You notice blood in vomit, in a bowel movement, or in urine.   You have an oral temperature above 102 F (38.9 C), not controlled by medicine.  You may have another PE. A blood clot in the lungs is a medical emergency. Call your local emergency services (911 in [place] to get to the nearest hospital or clinic. Do not drive yourself. MAKE SURE YOU:   Understand these instructions.   Will watch your condition.   Will get help right away if you are not doing well or get worse.  Document Released: 11/21/2000 Document Revised: 11/13/2011 Document Reviewed: 05/28/2009 [facility] Patient Information 2012 [place], [place].  Insulin Aspart injection What is this medicine? INSULIN ASPART (IN su lin AS part) is a human-made form of insulin. This drug lowers the amount of sugar in your blood. It is a fast acting insulin that starts working faster than regular insulin. It will not work as long as regular insulin. This medicine may be used for other purposes; ask your health care provider or pharmacist if  you have questions. What should I tell my health care provider before I take this medicine? They need to know if you have any of these conditions: -episodes of hypoglycemia -kidney disease -liver disease -an unusual or allergic reaction to insulin, metacresol, other medicines, foods, dyes, or preservatives -pregnant or trying to get pregnant -breast-feeding How should I use this medicine? This medicine is for injection under the skin. Use exactly as directed. It is important to follow the directions given to you by your health care professional or doctor. You should start your meal within 5 to 10  minutes after injection. You will be taught how to use this medicine and how to adjust doses for activities and illness. Do not use more insulin than prescribed. Do not use more or less often than prescribed. Always check the appearance of your insulin before using it. This medicine should be clear and colorless like water. Do not use if it is cloudy, thickened, colored, or has solid particles in it. It is important that you put your used needles and syringes in a special sharps container. Do not put them in a trash can. If you do not have a sharps container, call your pharmacist or healthcare provider to get one. Talk to your pediatrician regarding the use of this medicine in children. While this drug may be prescribed for children as young as 45 years of age for selected conditions, precautions do apply. Overdosage: If you think you have taken too much of this medicine contact a poison control center or emergency room at once. NOTE: This medicine is only for you. Do not share this medicine with others. What if I miss a dose? It is important not to miss a dose. Your health care professional or doctor should discuss a plan for missed doses with you. If you do miss a dose, follow their plan. Do not take double doses. What may interact with this medicine? -other medicines for diabetes Many medications may cause an increase or decrease in blood sugar, these include: -alcohol containing beverages -aspirin and aspirin-like drugs -chloramphenicol -chromium -diuretics -male hormones, like estrogens or progestins and birth control pills -heart medicines -isoniazid -male hormones or anabolic steroids -medicines for weight loss -medicines for allergies, asthma, cold, or cough -medicines for mental problems -medicines called MAO Inhibitors like Nardil, Parnate, Marplan, Eldepryl -niacin -NSAIDs, medicines for pain and inflammation, like ibuprofen or  naproxen -pentamidine -phenytoin -probenecid -quinolone antibiotics like ciprofloxacin, levofloxacin, ofloxacin -some herbal dietary supplements -steroid medicines like prednisone or cortisone -thyroid medicine Some medications can hide the warning symptoms of low blood sugar. You may need to monitor your blood sugar more closely if you are taking one of these medications. These include: -beta-blockers such as atenolol, metoprolol, propranolol -clonidine -guanethidine -reserpine This list may not describe all possible interactions. Give your health care provider a list of all the medicines, herbs, non-prescription drugs, or dietary supplements you use. Also tell them if you smoke, drink alcohol, or use illegal drugs. Some items may interact with your medicine. What should I watch for while using this medicine? Visit your health care professional or doctor for regular checks on your progress. To control your diabetes you must use this medicine regularly and follow a diet and exercise schedule. Checking and recording your blood sugar and urine ketone levels regularly is important. Use a blood sugar measuring device before you treat high or low blood sugar. Always carry a quick-source of sugar with you in case you have symptoms of low blood  sugar. Examples include hard sugar candy or glucose tablets. Make sure family members know that you can choke if you eat or drink when you develop serious symptoms of low blood sugar, such as seizures or unconsciousness. They must get medical help at once. Make sure that you have the right kind of syringe for the type of insulin you use. Try not to change the brand and type of insulin or syringe unless your health care professional or doctor tells you to. Switching insulin brand or type can cause dangerously high or low blood sugar. Always keep an extra supply of insulin, syringes, and needles on hand. Use a syringe one time only.Throw away syringe and needle in a  closed container to prevent accidental needle sticks. Insulin pens and cartridges should never be shared. Sharing may result in passing of viruses like hepatitis or HIV. Wear a medical identification bracelet or chain to say you have diabetes, and carry a card that lists all your medications. Many nonprescription cough and cold products contain sugar or alcohol. These can affect diabetes control or can alter the results of tests used to monitor blood sugar. Avoid alcohol. Avoid products that contain alcohol or sugar. What side effects may I notice from receiving this medicine? Side effects that you should report to your health care professional or doctor as soon as possible: Symptoms of low blood sugar: -You may feel nervous, confused, dizzy, hungry, weak, sweaty, shaky, cold, and irritable. You may also experience headache, blurred vision, rapid heartbeat and loss of consciousness. Symptoms of high blood sugar: -You may experience dizziness, dry mouth, dry skin, fruity breath, loss of appetite, nausea, stomach ache, unusual thirst, frequent urination Insulin also can cause rare but serious allergic reactions in some patients, including: -bad skin rash and itching -breathing problems Side effects that usually do not require medical attention (report to your health care professional or doctor if they continue or are bothersome): -increase or decrease in fatty tissue under the skin, through overuse of a particular injection -itching, burning, swelling, or rash at the injection site This list may not describe all possible side effects. Call your doctor for medical advice about side effects. You may report side effects to FDA at 1-800-FDA-1088. Where should I keep my medicine? Keep out of the reach of children. Store unopened insulin vials in a refrigerator between 2 and 8 degrees C (36 and 46 degrees F). Do not freeze or use if the insulin has been frozen. Opened vials (vials currently in use) may be  stored in the refrigerator or at room temperature, at approximately 30 degrees C (86 degrees F) or cooler. Keeping your insulin at room temperature decreases the amount of pain during injection. Once opened, your insulin can be used for 28 days. After 28 days, the vial of insulin should be thrown away. Store unopened cartridges, FlexPens, or Novalog Innolet systems in a refrigerator between 2 and 8 degrees C (36 and 46 degrees F.) Do not freeze or use if the insulin has been frozen. Once opened, the Novalog Innolet system, FlexPen, and cartridges that are inserted into pens should be kept at room temperature, approximately 25 degrees C (77 degrees F) or cooler. Do not store in the refrigerator. Once opened, the insulin can be used for 28 days. After 28 days, the cartridge, Novalog Innolet system or FlexPen should be thrown away. Protect from light and excessive heat. Throw away any unused medicine after the expiration date or after the specified time for room temperature storage  has passed. NOTE: This sheet is a summary. It may not cover all possible information. If you have questions about this medicine, talk to your doctor, pharmacist, or health care provider.  Insulin Glargine injection What is this medicine? INSULIN GLARGINE (IN su lin GLAR geen) is a human-made form of insulin. This drug lowers the amount of sugar in your blood. It is a long-acting insulin that is usually given once a day. This medicine may be used for other purposes; ask your health care provider or pharmacist if you have questions. What should I tell my health care provider before I take this medicine? They need to know if you have any of these conditions: -episodes of hypoglycemia -kidney disease -liver disease -an unusual or allergic reaction to insulin, metacresol, other medicines, foods, dyes, or preservatives -pregnant or trying to get pregnant -breast-feeding How should I use this medicine? This medicine is for  injection under the skin. Use exactly as directed. It is important to follow the directions given to you by your health care professional or doctor. You will be taught how to use this medicine and how to adjust doses for activities and illness. You may take this medicine at any time of the day but you must take it at the same time everyday. Do not use more insulin than prescribed. Do not use more or less often than prescribed. Always check the appearance of your insulin before using it. This medicine should be clear and colorless like water. Do not use it if it is cloudy, thickened, colored, or has solid particles in it. Do not mix this medicine with any other insulin or diluent. It is important that you put your used needles and syringes in a special sharps container. Do not put them in a trash can. If you do not have a sharps container, call your pharmacist or healthcare provider to get one. Talk to your pediatrician regarding the use of this medicine in children. Special care may be needed. Overdosage: If you think you have taken too much of this medicine contact a poison control center or emergency room at once. NOTE: This medicine is only for you. Do not share this medicine with others. What if I miss a dose? It is important not to miss a dose. Your health care professional or doctor should discuss a plan for missed doses with you. If you do miss a dose, follow their plan. Do not take double doses. What may interact with this medicine? -other medicines for diabetes Many medications may cause an increase or decrease in blood sugar, these include: -alcohol containing beverages -aspirin and aspirin-like drugs -chloramphenicol -chromium -diuretics -male hormones, like estrogens or progestins and birth control pills -heart medicines -isoniazid -male hormones or anabolic steroids -medicines for weight loss -medicines for allergies, asthma, cold, or cough -medicines for mental  problems -medicines called MAO Inhibitors like Nardil, Parnate, Marplan, Eldepryl -niacin -NSAIDs, medicines for pain and inflammation, like ibuprofen or naproxen -pentamidine -phenytoin -probenecid -quinolone antibiotics like ciprofloxacin, levofloxacin, ofloxacin -some herbal dietary supplements -steroid medicines like prednisone or cortisone -thyroid medicine Some medications can hide the warning symptoms of low blood sugar. You may need to monitor your blood sugar more closely if you are taking one of these medications. These include: -beta-blockers such as atenolol, metoprolol, propranolol -clonidine -guanethidine -reserpine This list may not describe all possible interactions. Give your health care provider a list of all the medicines, herbs, non-prescription drugs, or dietary supplements you use. Also tell them if you smoke,  drink alcohol, or use illegal drugs. Some items may interact with your medicine. What should I watch for while using this medicine? Visit your health care professional or doctor for regular checks on your progress. To control your diabetes you must use this medicine regularly and follow a diet and exercise schedule. Checking and recording your blood sugar and urine ketone levels regularly is important. Use a blood sugar measuring device before you treat high or low blood sugar. Always carry a quick-source of sugar with you in case you have symptoms of low blood sugar. Examples include hard sugar candy or glucose tablets. Make sure family members know that you can choke if you eat or drink when you develop serious symptoms of low blood sugar, such as seizures or unconsciousness. They must get medical help at once. Make sure that you have the right kind of syringe for the type of insulin you use. Try not to change the brand and type of insulin or syringe unless your health care professional or doctor tells you to. Switching insulin brand or type can cause dangerously  high or low blood sugar. Always keep an extra supply of insulin, syringes, and needles on hand. Use a syringe one time only. Throw away syringe and needle in a closed container to prevent accidental needle sticks. Insulin pens and cartridges should never be shared. Sharing may result in passing of viruses like hepatitis or HIV. Wear a medical identification bracelet or chain to say you have diabetes, and carry a card that lists all your medications. Many nonprescription cough and cold products contain sugar or alcohol. These can affect diabetes control or can alter the results of tests used to monitor blood sugar. Avoid alcohol. Avoid products that contain alcohol or sugar. What side effects may I notice from receiving this medicine? Side effects that you should report to your health care professional or doctor as soon as possible: Symptoms of low blood sugar: -You may feel nervous, confused, dizzy, hungry, weak, sweaty, shaky, cold, and irritable. You may also experience headache, blurred vision, rapid heartbeat and loss of consciousness. Symptoms of high blood sugar: -You may experience dizziness, dry mouth, dry skin, fruity breath, loss of appetite, nausea, stomach ache, unusual thirst, frequent urination Insulin also can cause rare but serious allergic reactions in some patients, including: -bad skin rash and itching -breathing problems Side effects that usually do not require medical attention (report to your health care professional or doctor if they continue or are bothersome): -increase or decrease in fatty tissue under the skin, through overuse of a particular injection -itching, burning, swelling, or rash at the injection site This list may not describe all possible side effects. Call your doctor for medical advice about side effects. You may report side effects to FDA at 1-800-FDA-1088. Where should I keep my medicine? Keep out of the reach of children. Store unopened vials in a  refrigerator between 2 and 8 degrees C (36 and 46 degrees F). Do not freeze or use if the insulin has been frozen. Opened vials (vials currently in use) may be stored in the refrigerator or at room temperature, at approximately 25 degrees C (77 degrees F) or cooler. Keeping your insulin at room temperature decreases the amount of pain during injection. Once opened, your insulin can be used for 28 days. After 28 days, the vial should be thrown away. Store unopened pen-injector cartridges in a refrigerator between 2 and 8 degrees C (36 and 46 degrees F.) Do not freeze or use  if the insulin has been frozen. Insulin cartridges inserted into the [employer] system should be kept at room temperature, approximately 25 degrees C (77 degrees F) or cooler. Do not store in the refrigerator. Once inserted into the [employer] system, the insulin can be used for 28 days. After 28 days, the cartridge should be thrown away. Protect from light and excessive heat. Throw away any unused medicine after the expiration date or after the specified time for room temperature storage has passed. NOTE: This sheet is a summary. It may not cover all possible information. If you have questions about this medicine, talk to your doctor, pharmacist, or health care provider.  2012, Elsevier/Gold Standard. (02/25/2008 11:47:33 AM)  Prothrombin Time, International Normalized Ratio A Prothrombin Time (Protime, PT) measures the time that it takes for your blood to clot. The International Normalized Ratio (INR) is a calculation of blood clotting time based upon your PT result. Most laboratories report both PT and INR values when reporting blood clotting times. The PT is used to determine:  Certain medical conditions that can cause bleeding disorders. These can include:   Liver disease.   Systemic infection (sepsis).   Inherited (genetic) bleeding disorders.   The appropriate dose of warfarin (Coumadin) needed to treat various conditions that  require the use of a blood thinner (anticoagulant). There is no standard warfarin dose. Each person is unique and will require their own warfarin dose based on PT and INR levels.  An INR should only be used to determine the appropriate dose of an anticoagulant. It is important to know that:  PT and INR levels are checked per your caregiver's recommendations. It is very important to keep your PT and INR lab appointments. Not keeping the appointments could result in a chronic or permanent injury, pain, and disability. If there is any problem keeping the appointment, you must call your caregiver.   PT and INR levels can be affected by certain foods you eat. It is important to notify your caregiver if you have a change in your diet.   PT and INR levels can be affected by some medications. It is important when taking new prescriptions or over-the-counter medications that you notify your caregiver.   Different medical conditions may require different PT and INR levels. For example, a mechanical heart valve may require a higher INR level than those being treated for a clot in the leg.  PREPARATION FOR TEST A blood sample is obtained by inserting a needle into a vein in the arm. If you are on warfarin therapy, the blood sample should be obtained before taking your daily dose. NORMAL FINDINGS  A normal PT is 10 to 13 seconds.   A normal INR level (without anticoagulant therapy) is 0.9 to 1.1.   INR levels (with anticoagulant therapy) generally range from 2 to 3.5 depending on the medical condition that warfarin is being used to treat.   INR levels greater than 3.5 mean that it is taking too long to form a blood clot.  Ranges for normal findings may vary among different laboratories and hospitals. You should always check with your doctor after having lab work or other tests done to discuss the meaning of your test results and whether your values are considered within normal limits. MEANING OF TEST   Your caregiver will go over the test results with you and discuss the importance and meaning of your results, as well as treatment options and the need for additional tests if necessary. OBTAINING THE TEST  RESULTS It is your responsibility to obtain your test results. Ask the lab or department performing the test when and how you will get your results. SEEK IMMEDIATE MEDICAL CARE IF:   You develop bleeding from the nose, mouth, or gums.   You vomit or cough up blood.   Your bowel movements are bloody or black in color.   You have a severe headache that does not go away.   You skin bruises easily.  Document Released: 12/27/2004 Document Revised: 11/13/2011 Document Reviewed: 11/05/2008 [facility] Patient Information 2012 [place], [place].  2012, Elsevier/Gold Standard. (11/20/2008 10:46:45 AM) Warfarin  Warfarin is a blood thinner (anticoagulant) medicine. It is used to keep clots from forming in your blood. When you take warfarin, you may bruise easily. You may also bleed a little longer if you cut yourself.  Before taking warfarin, tell your doctor if:  You take any other medicine for your heart or blood pressure.   You are pregnant.   You plan to get pregnant.   You are breastfeeding.   You are allergic to any medicine.  HOME CARE  Take warfarin as told by your doctor. Do not stop the medicine unless your doctor tells you to.   Take your medicine at the same time every day.   Do not take anything that has aspirin in it unless your doctor says it is okay.   Do not drink alcohol.   Tell all your doctors and dentists that you are taking warfarin before they treat you or give you any medicines.   Keep all your appointments for doctor visits and blood tests.   Keep warfarin out of reach of children. Do not share warfarin with anyone.   Eat about the same amount of vitamin K foods every day.   High vitamin K foods: Beef liver. Pork liver. Green tea. Broccoli. Brussels  sprouts. Cauliflower. Chickpeas. Kale. Spinach. Turnip greens.   Medium vitamin K foods: Chicken liver. Pork tenderloin. Cheddar cheese. Rolled oats. Coffee. Asparagus. Cabbage. Iceberg lettuce.   Low vitamin K foods: Apples. Butter. Bananas. Skim or 1% milk. Orange rice. Canned pears. White bread. Strawberries. Corn. Tomatoes. Green beans. Eggs. Potatoes. Tomasa Blase. Pumpkin. Chicken breasts. Ground beef. Oil (except soybean oil).  GET HELP RIGHT AWAY IF:  You miss a dose of warfarin. Do not take 2 doses at the same time.   You have a skin rash.   You have heavy or unusual bleeding.   There is blood in your pee (urine) or poop (stool).   You have side effects from medicine that do not get better after a few days.  MAKE SURE YOU:  Understand these instructions.   Will watch your condition.   Will get help right away if you are not doing well or get worse.  Document Released: 12/27/2010 Document Revised: 11/13/2011 Document Reviewed: 12/27/2010   [facility] Patient Information 2012 [place], [place].

## 2013-03-24 IMAGING — CT CT ANGIO CHEST
2 of 9 series · 13 of 36 positions shown · IV contrast (omnipaque)
Comparison: None.

CLINICAL DATA: Unresponsive, elevated D-dimer.  Concern for
pulmonary embolism

CT ANGIOGRAPHY CHEST
TECHNIQUE: Multidetector CT imaging of the chest using the
standard protocol during bolus administration of intravenous
contrast. Multiplanar reconstructed images including MIPs were
obtained and reviewed to evaluate the vascular anatomy.
Contrast: 80mL OMNIPAQUE IOHEXOL 350 MG/ML SOLN

[Series 7: pe thins · axial · 0.80mm/px · z∈[-297,-52]mm · 12 of 581 slices shown]
[im 45/581  lung]
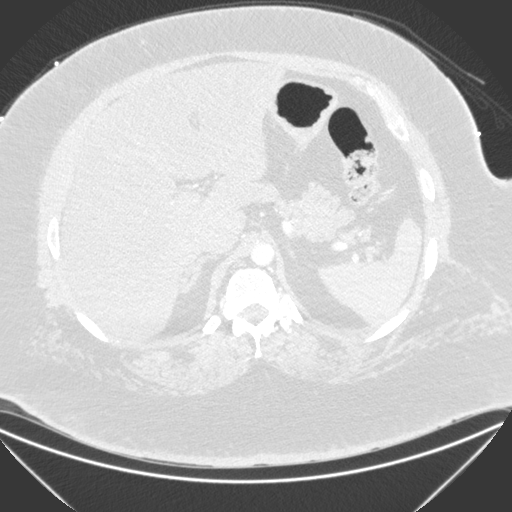
[im 90/581  mediastinal]
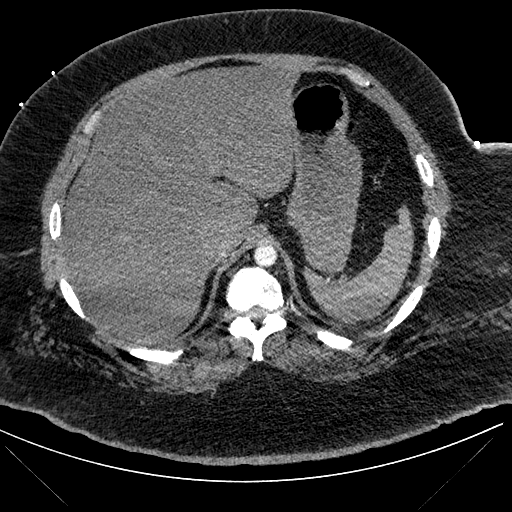
[im 134/581  lung]
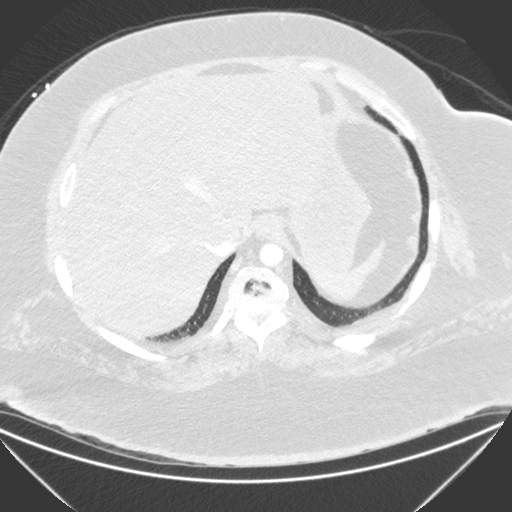
[im 179/581  mediastinal]
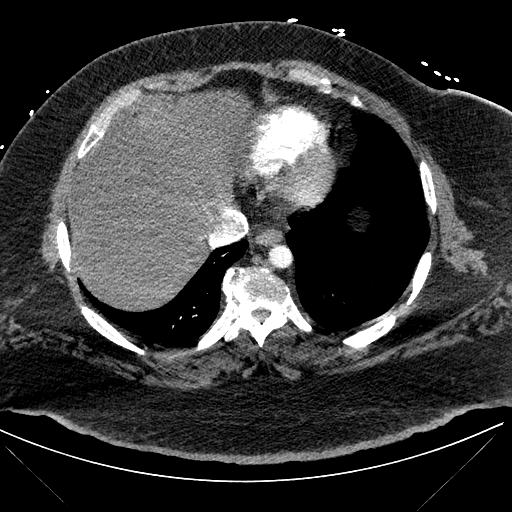
[im 224/581  lung]
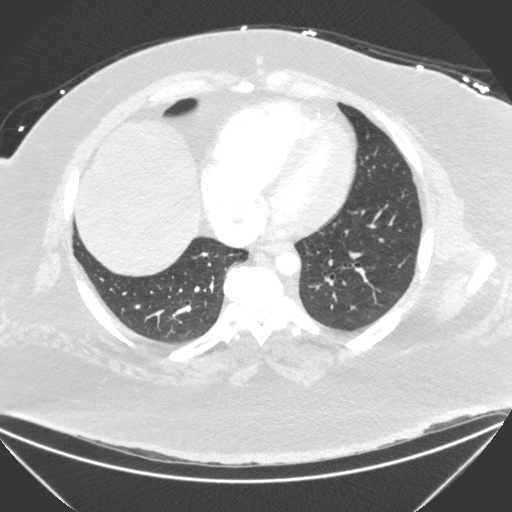
[im 268/581  mediastinal]
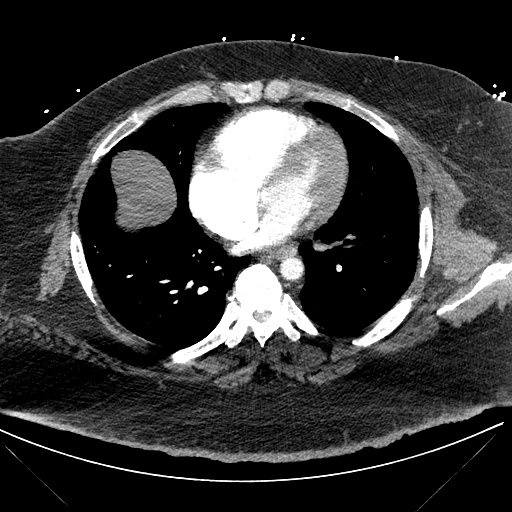
[im 313/581  lung]
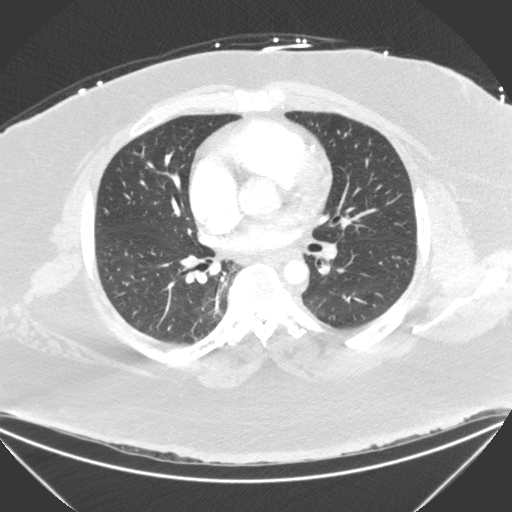
[im 357/581  mediastinal]
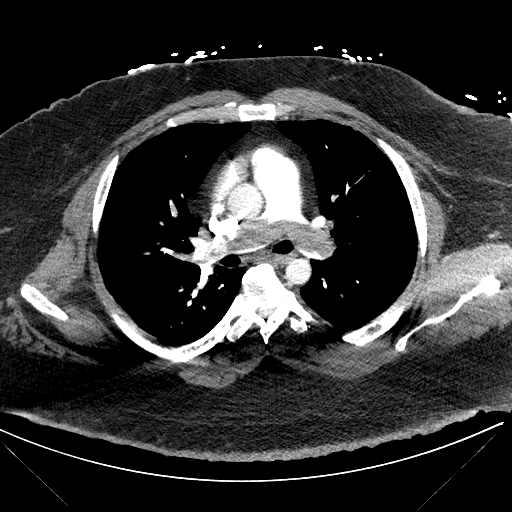
[im 402/581  lung]
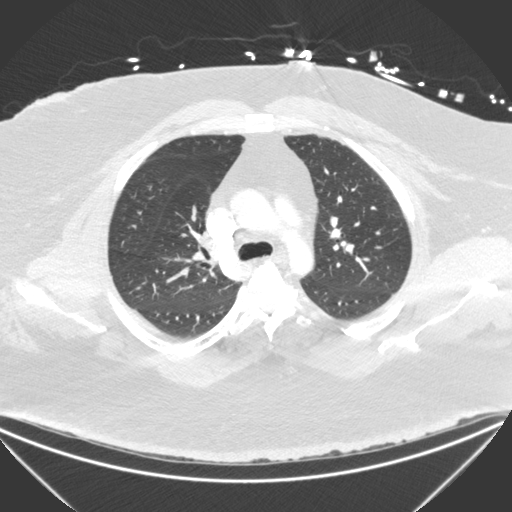
[im 447/581  mediastinal]
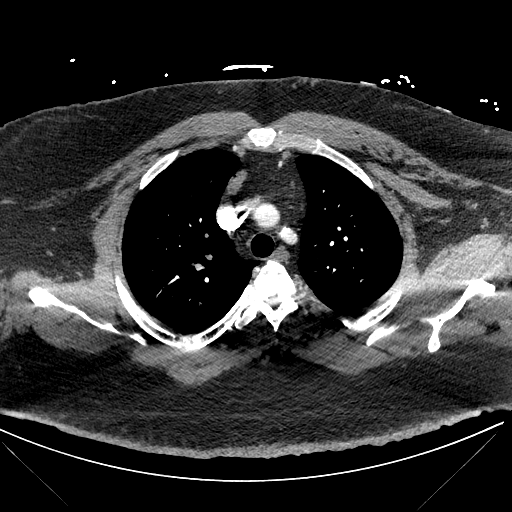
[im 491/581  lung]
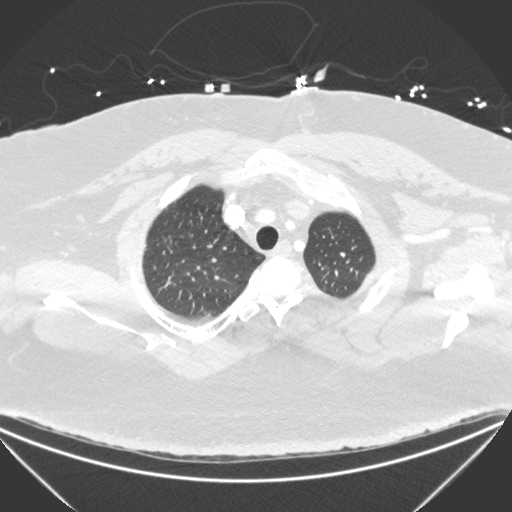
[im 536/581  mediastinal]
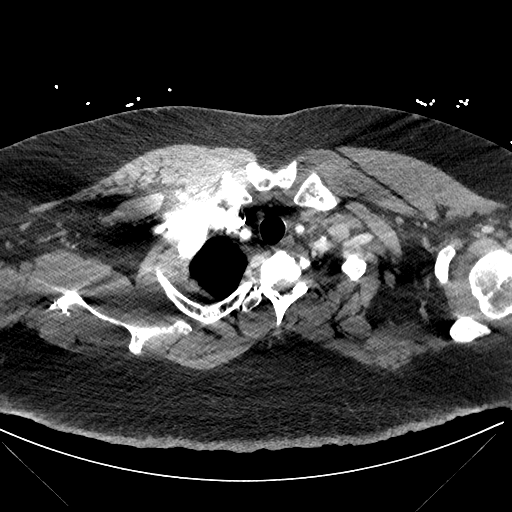

[mpr, coronals, coronal · coronal · 0.80mm/px · 1 of 146 slices shown]
[im 73/146  mediastinal]
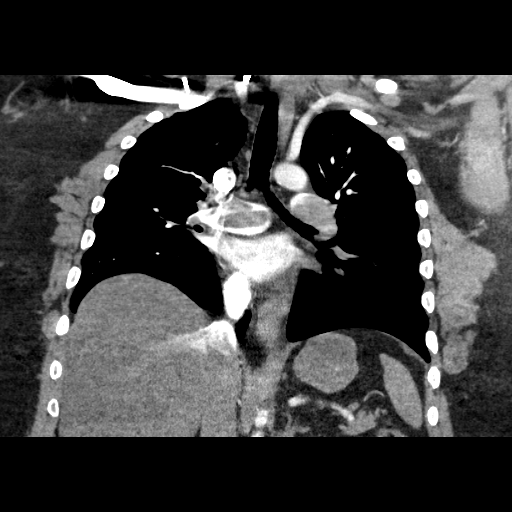

[13 of 36 positions shown; findings below may reference images not displayed]

FINDINGS: There is a large tubular thrombus which spans the left
and right proximal main pulmonary arteries. Thrombus  expands the
left main pulmonary artery.  Thrombus extends to the proximal left
lower lobe pulmonary artery.  Minimal extension into the upper lobe
left upper lobe pulmonary artery.  Thrombi extends into the right
lower lobe pulmonary arteries as well as the right middle lobe
pulmonary artery.  There is no clear evidence of right ventricular
strain.

The aorta and great vessels are normal.  No pericardial fluid.
Esophagus is normal.  Review lung parenchyma demonstrates no acute
findings.  Limited view of the upper abdomen is unremarkable.  The
limited view of the skeleton is unremarkable.
IMPRESSION: Large acute pulmonary embolism spanning the main pulmonary arteries
(saddle pulmonary embolism).  Overall clot burden is severe and
extends into the proximal branches of the left and right pulmonary
arteries.

Critical results were conveyed to Dr. Josdiel Yomar  on 03/20/2012 at
15 50 hours

## 2013-03-26 IMAGING — US US ABDOMEN PORT
1 series · 14 of 25 positions shown · non-contrast
Comparison: CT scan of the chest dated 03/20/2012

CLINICAL DATA: Cirrhosis.  Elevated liver function tests.

COMPLETE ABDOMINAL ULTRASOUND

[Series 1: us abdomen port · 0.33mm/px · 14 of 59 slices shown]
[im 1/59]
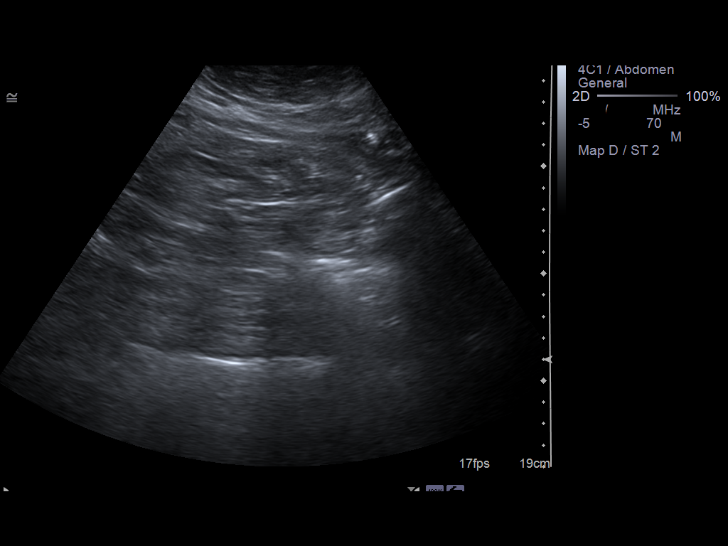
[im 5/59]
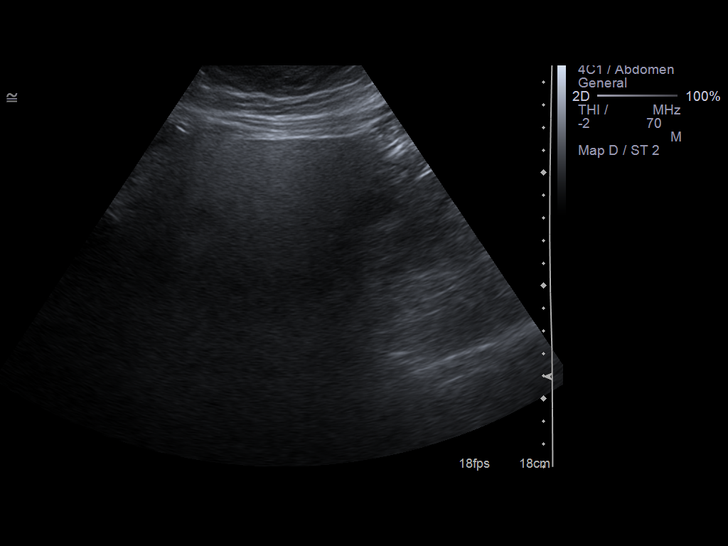
[im 10/59]
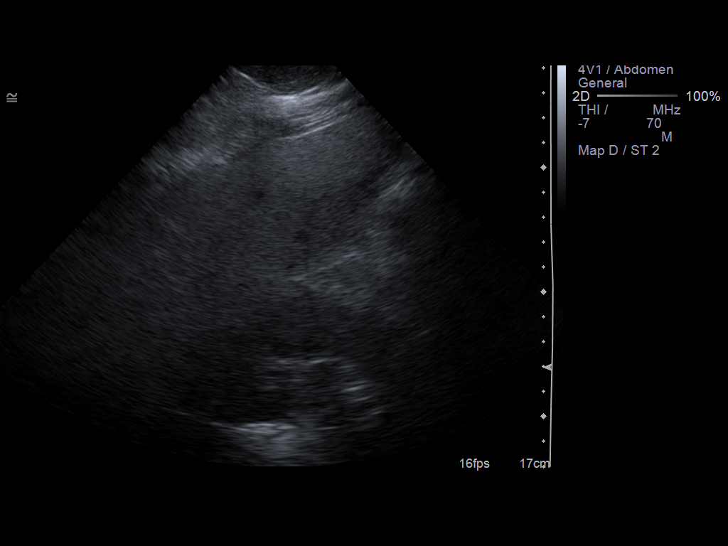
[im 15/59]
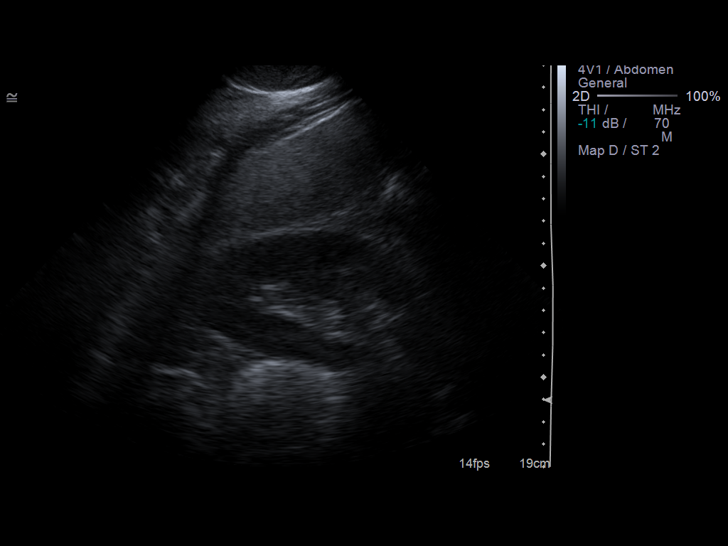
[im 20/59]
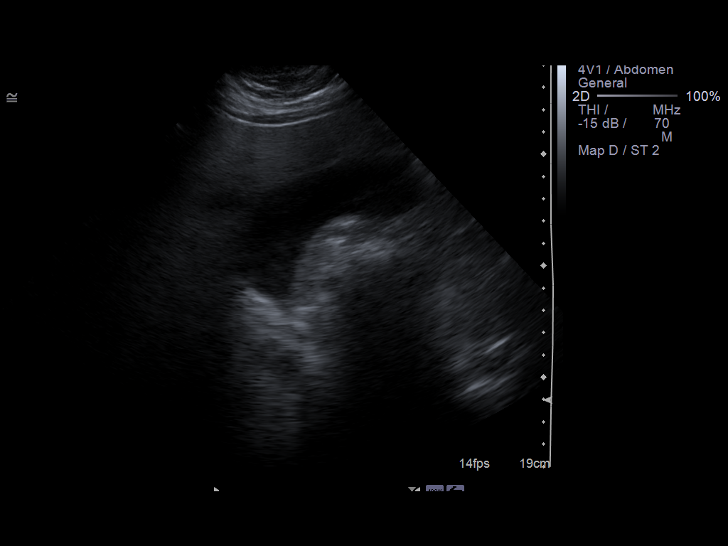
[im 22/59]
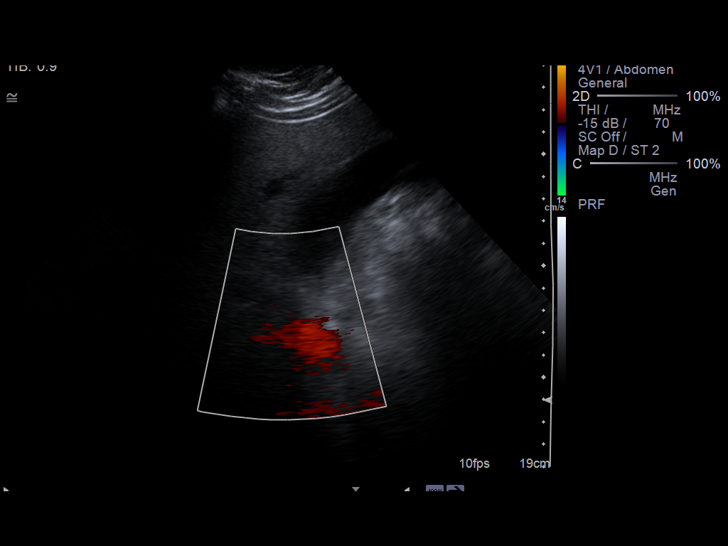
[im 27/59]
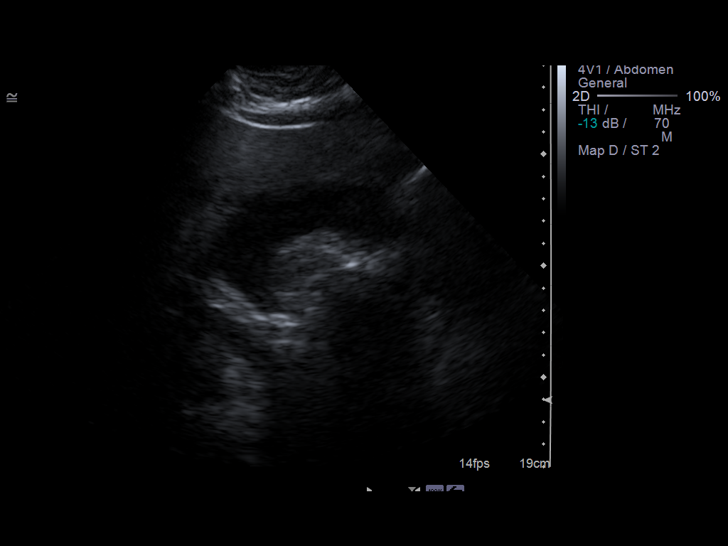
[im 32/59]
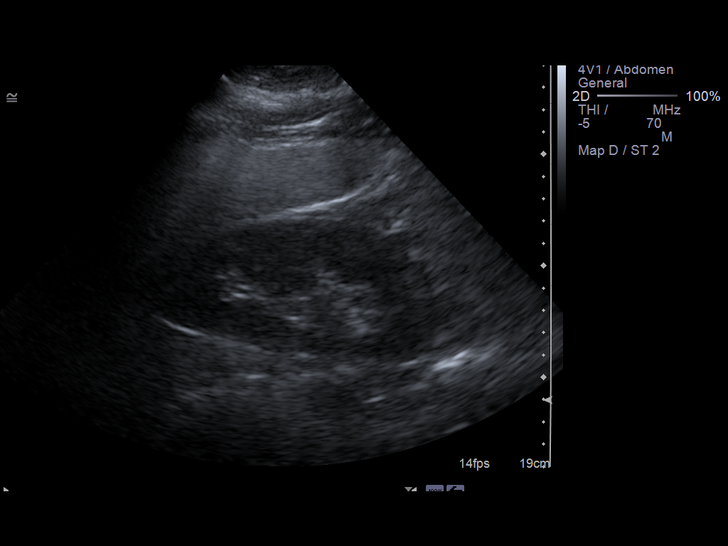
[im 37/59]
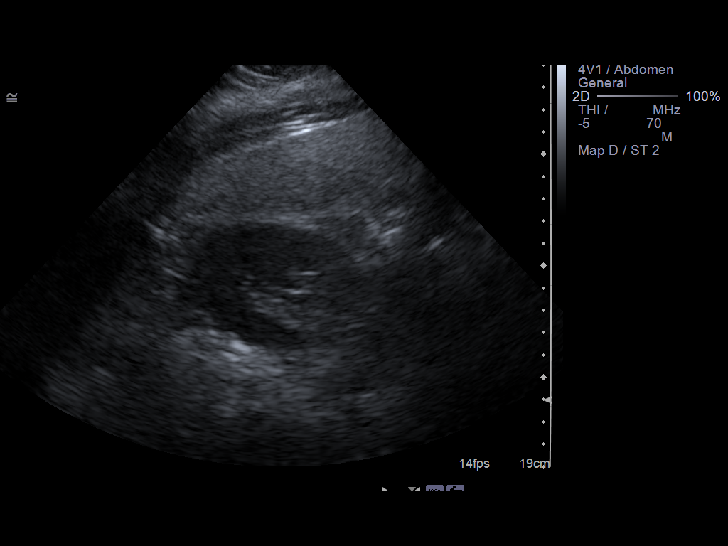
[im 39/59]
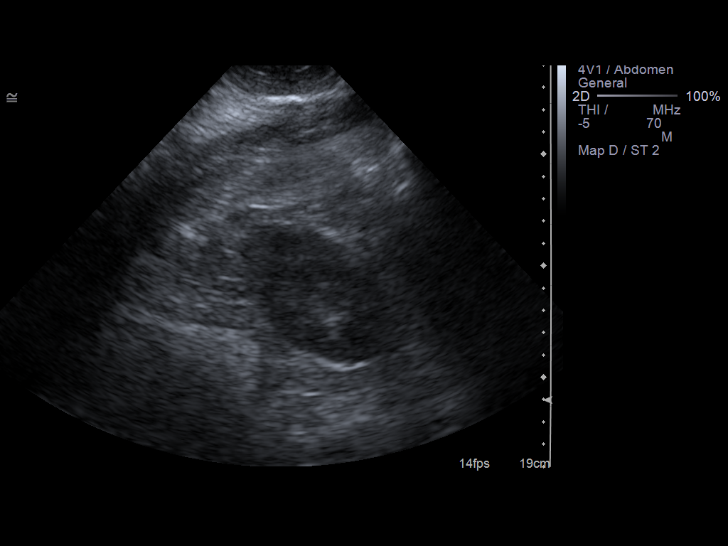
[im 44/59]
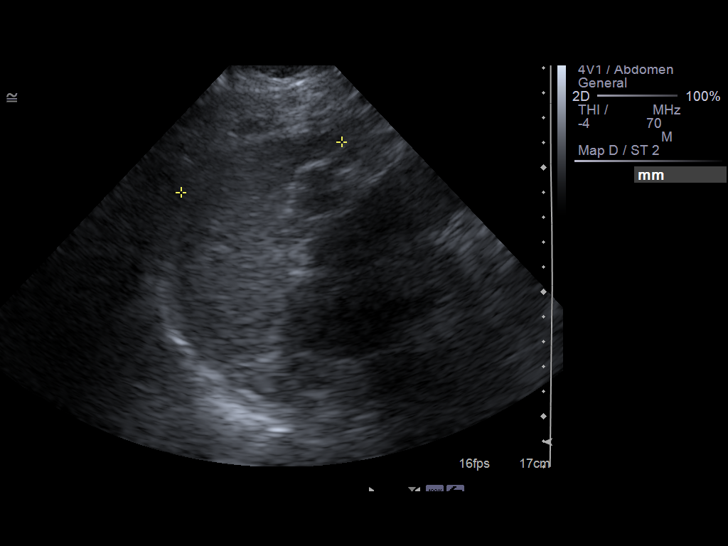
[im 49/59]
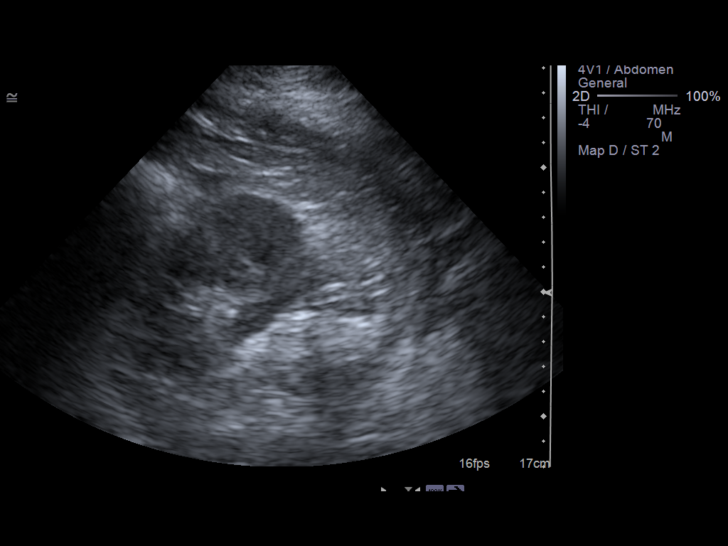
[im 54/59]
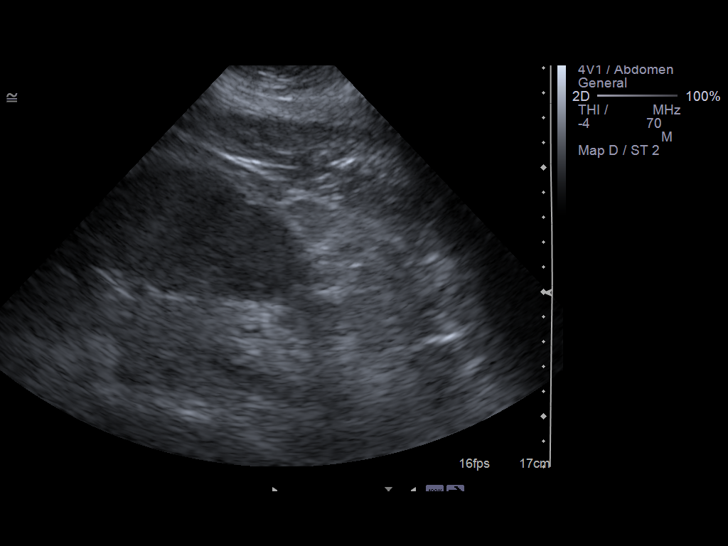
[im 59/59]
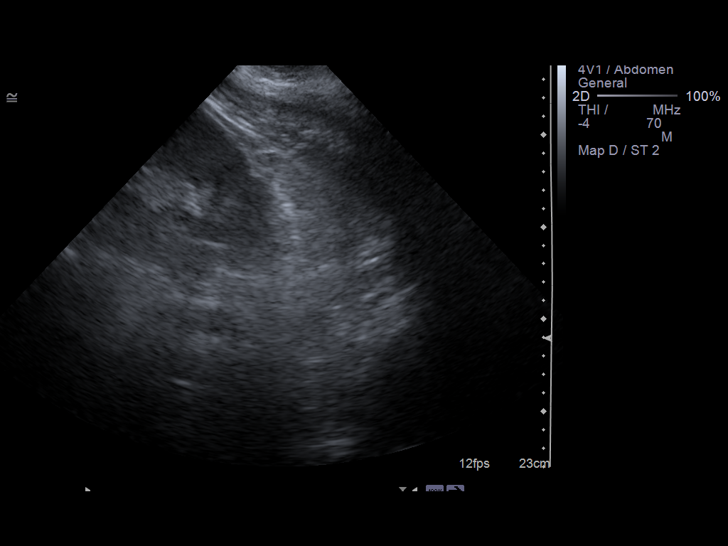

[14 of 25 positions shown; findings below may reference images not displayed]

FINDINGS: Gallbladder:  No gallstones, gallbladder wall thickening, or
pericholecystic fluid. Negative sonographic Murphy's sign.

Common bile duct:  4 mm in diameter, normal.

Liver:  Echogenic liver parenchyma consistent with hepatic
steatosis.  No focal lesions.

IVC:  Not visualized.

Pancreas:  Not visualized.

Spleen:  Normal.  6.8 cm in length.

Right Kidney:  Normal.  12.8 cm in length.

Left Kidney:  Normal.  11.8 cm in length.

Abdominal aorta:  Proximal abdominal aorta is 2.3 cm in diameter.
Distal aorta is obscured by bowel.
IMPRESSION: 1.  Hepatic steatosis.
2.  No other visible abnormalities.  Several structures were not
visualized, as described above.
3.  No ascites.

## 2014-02-24 ENCOUNTER — Emergency Department (HOSPITAL_COMMUNITY): Payer: Medicare Other

## 2014-02-24 ENCOUNTER — Inpatient Hospital Stay (HOSPITAL_COMMUNITY)
Admission: EM | Admit: 2014-02-24 | Discharge: 2014-02-28 | DRG: 175 | Disposition: A | Discharge status: Home / Self Care | Payer: Medicare Other | Attending: Internal Medicine | Admitting: Internal Medicine

## 2014-02-24 ENCOUNTER — Encounter (HOSPITAL_COMMUNITY): Payer: Self-pay | Admitting: Emergency Medicine

## 2014-02-24 DIAGNOSIS — E785 Hyperlipidemia, unspecified: Secondary | ICD-10-CM | POA: Diagnosis present

## 2014-02-24 DIAGNOSIS — I959 Hypotension, unspecified: Secondary | ICD-10-CM | POA: Diagnosis present

## 2014-02-24 DIAGNOSIS — Z9119 Patient's noncompliance with other medical treatment and regimen: Secondary | ICD-10-CM

## 2014-02-24 DIAGNOSIS — J9601 Acute respiratory failure with hypoxia: Secondary | ICD-10-CM | POA: Diagnosis present

## 2014-02-24 DIAGNOSIS — Z794 Long term (current) use of insulin: Secondary | ICD-10-CM

## 2014-02-24 DIAGNOSIS — R000 Tachycardia, unspecified: Secondary | ICD-10-CM | POA: Diagnosis present

## 2014-02-24 DIAGNOSIS — E119 Type 2 diabetes mellitus without complications: Secondary | ICD-10-CM | POA: Diagnosis present

## 2014-02-24 DIAGNOSIS — D696 Thrombocytopenia, unspecified: Secondary | ICD-10-CM | POA: Diagnosis present

## 2014-02-24 DIAGNOSIS — E669 Obesity, unspecified: Secondary | ICD-10-CM | POA: Diagnosis present

## 2014-02-24 DIAGNOSIS — E1129 Type 2 diabetes mellitus with other diabetic kidney complication: Secondary | ICD-10-CM | POA: Diagnosis present

## 2014-02-24 DIAGNOSIS — I10 Essential (primary) hypertension: Secondary | ICD-10-CM | POA: Diagnosis present

## 2014-02-24 DIAGNOSIS — J9600 Acute respiratory failure, unspecified whether with hypoxia or hypercapnia: Secondary | ICD-10-CM | POA: Diagnosis present

## 2014-02-24 DIAGNOSIS — I739 Peripheral vascular disease, unspecified: Secondary | ICD-10-CM | POA: Diagnosis present

## 2014-02-24 DIAGNOSIS — Z91199 Patient's noncompliance with other medical treatment and regimen due to unspecified reason: Secondary | ICD-10-CM

## 2014-02-24 DIAGNOSIS — Z79899 Other long term (current) drug therapy: Secondary | ICD-10-CM

## 2014-02-24 DIAGNOSIS — I2699 Other pulmonary embolism without acute cor pulmonale: Principal | ICD-10-CM | POA: Diagnosis present

## 2014-02-24 DIAGNOSIS — Z86711 Personal history of pulmonary embolism: Secondary | ICD-10-CM

## 2014-02-24 DIAGNOSIS — E875 Hyperkalemia: Secondary | ICD-10-CM | POA: Diagnosis present

## 2014-02-24 DIAGNOSIS — I878 Other specified disorders of veins: Secondary | ICD-10-CM

## 2014-02-24 DIAGNOSIS — Z86718 Personal history of other venous thrombosis and embolism: Secondary | ICD-10-CM

## 2014-02-24 DIAGNOSIS — I82629 Acute embolism and thrombosis of deep veins of unspecified upper extremity: Secondary | ICD-10-CM | POA: Diagnosis present

## 2014-02-24 DIAGNOSIS — I2789 Other specified pulmonary heart diseases: Secondary | ICD-10-CM | POA: Diagnosis present

## 2014-02-24 DIAGNOSIS — Z7901 Long term (current) use of anticoagulants: Secondary | ICD-10-CM

## 2014-02-24 LAB — PROTIME-INR
INR: 1.87 — ABNORMAL HIGH (ref 0.00–1.49)
Prothrombin Time: 21 seconds — ABNORMAL HIGH (ref 11.6–15.2)

## 2014-02-24 LAB — CBC WITH DIFFERENTIAL/PLATELET
Basophils Absolute: 0 10*3/uL (ref 0.0–0.1)
Basophils Relative: 0 % (ref 0–1)
EOS ABS: 0.1 10*3/uL (ref 0.0–0.7)
Eosinophils Relative: 1 % (ref 0–5)
HCT: 44.8 % (ref 39.0–52.0)
Hemoglobin: 15.5 g/dL (ref 13.0–17.0)
LYMPHS PCT: 45 % (ref 12–46)
Lymphs Abs: 1.8 10*3/uL (ref 0.7–4.0)
MCH: 32.4 pg (ref 26.0–34.0)
MCHC: 34.6 g/dL (ref 30.0–36.0)
MCV: 93.7 fL (ref 78.0–100.0)
MONOS PCT: 11 % (ref 3–12)
Monocytes Absolute: 0.5 10*3/uL (ref 0.1–1.0)
Neutro Abs: 1.8 10*3/uL (ref 1.7–7.7)
Neutrophils Relative %: 43 % (ref 43–77)
Platelets: 78 10*3/uL — ABNORMAL LOW (ref 150–400)
RBC: 4.78 MIL/uL (ref 4.22–5.81)
RDW: 14.3 % (ref 11.5–15.5)
WBC: 4.1 10*3/uL (ref 4.0–10.5)

## 2014-02-24 LAB — BASIC METABOLIC PANEL
BUN: 18 mg/dL (ref 6–23)
CALCIUM: 9.5 mg/dL (ref 8.4–10.5)
CO2: 25 meq/L (ref 19–32)
Chloride: 95 mEq/L — ABNORMAL LOW (ref 96–112)
Creatinine, Ser: 0.97 mg/dL (ref 0.50–1.35)
GFR calc Af Amer: >90 mL/min (ref >90)
GLUCOSE: 374 mg/dL — AB (ref 70–99)
POTASSIUM: 4.4 meq/L (ref 3.7–5.3)
Sodium: 137 mEq/L (ref 137–147)

## 2014-02-24 LAB — I-STAT TROPONIN, ED: TROPONIN I, POC: 0.03 ng/mL (ref 0.00–0.08)

## 2014-02-24 LAB — CBG MONITORING, ED: Glucose-Capillary: 389 mg/dL — ABNORMAL HIGH (ref 70–99)

## 2014-02-24 MED ORDER — SODIUM CHLORIDE 0.9 % IV BOLUS (SEPSIS)
1000.0000 mL | Freq: Once | INTRAVENOUS | Status: AC
  Administered 2014-02-24: 1000 mL via INTRAVENOUS

## 2014-02-24 NOTE — ED Notes (Signed)
Repeat EKG given to Dr. Dina Rich.

## 2014-02-24 NOTE — ED Notes (Signed)
Pt placed on 2L [place] due to O2 sats being 89% on RA.

## 2014-02-24 NOTE — ED Notes (Signed)
Pt with 2 syncopal episodes 1 witnessed and 1 unwitnessed.  Pt negative on stroke scale and c-spine.  Last time pt had syncopal episodes he was + for blood clots.  Pt denies chest pain, shortness of breath, n/v/d; pt reporting weakness. EKG unremarkable.  VSS.

## 2014-02-24 NOTE — ED Notes (Signed)
X-ray at bedside

## 2014-02-24 NOTE — ED Notes (Signed)
Pt given 324 of ASA with EMS. Pt diaphoretic on arrival.

## 2014-02-24 NOTE — ED Notes (Signed)
Pt reporting chest pressure and increased shortness of breath.

## 2014-02-25 ENCOUNTER — Emergency Department (HOSPITAL_COMMUNITY): Payer: Medicare Other

## 2014-02-25 DIAGNOSIS — E875 Hyperkalemia: Secondary | ICD-10-CM | POA: Diagnosis present

## 2014-02-25 DIAGNOSIS — I2699 Other pulmonary embolism without acute cor pulmonale: Secondary | ICD-10-CM | POA: Diagnosis present

## 2014-02-25 DIAGNOSIS — J9601 Acute respiratory failure with hypoxia: Secondary | ICD-10-CM | POA: Diagnosis present

## 2014-02-25 DIAGNOSIS — J9600 Acute respiratory failure, unspecified whether with hypoxia or hypercapnia: Secondary | ICD-10-CM

## 2014-02-25 HISTORY — DX: Acute respiratory failure with hypoxia: J96.01

## 2014-02-25 LAB — GLUCOSE, CAPILLARY
GLUCOSE-CAPILLARY: 465 mg/dL — AB (ref 70–99)
Glucose-Capillary: 300 mg/dL — ABNORMAL HIGH (ref 70–99)
Glucose-Capillary: 238 mg/dL — ABNORMAL HIGH (ref 70–99)
Glucose-Capillary: 193 mg/dL — ABNORMAL HIGH (ref 70–99)
Glucose-Capillary: 175 mg/dL — ABNORMAL HIGH (ref 70–99)

## 2014-02-25 LAB — BASIC METABOLIC PANEL
BUN: 19 mg/dL (ref 6–23)
BUN: 20 mg/dL (ref 6–23)
CALCIUM: 9.6 mg/dL (ref 8.4–10.5)
CALCIUM: 9.4 mg/dL (ref 8.4–10.5)
CO2: 22 mEq/L (ref 19–32)
CO2: 20 mEq/L (ref 19–32)
CREATININE: 0.91 mg/dL (ref 0.50–1.35)
Chloride: 99 mEq/L (ref 96–112)
Chloride: 98 mEq/L (ref 96–112)
Creatinine, Ser: 0.88 mg/dL (ref 0.50–1.35)
GFR calc Af Amer: >90 mL/min (ref >90)
GFR calc non Af Amer: >90 mL/min (ref >90)
Glucose, Bld: 409 mg/dL — ABNORMAL HIGH (ref 70–99)
Glucose, Bld: 346 mg/dL — ABNORMAL HIGH (ref 70–99)
Potassium: 5.7 mEq/L — ABNORMAL HIGH (ref 3.7–5.3)
Potassium: 6.1 mEq/L — ABNORMAL HIGH (ref 3.7–5.3)
SODIUM: 136 meq/L — AB (ref 137–147)
Sodium: 136 mEq/L — ABNORMAL LOW (ref 137–147)

## 2014-02-25 LAB — I-STAT ARTERIAL BLOOD GAS, ED
Acid-base deficit: 3 mmol/L — ABNORMAL HIGH (ref 0.0–2.0)
Bicarbonate: 22.8 mEq/L (ref 20.0–24.0)
O2 SAT: 100 %
PCO2 ART: 40.6 mmHg (ref 35.0–45.0)
PH ART: 7.355 (ref 7.350–7.450)
PO2 ART: 332 mmHg — AB (ref 80.0–100.0)
Patient temperature: 97.4
TCO2: 24 mmol/L (ref 0–100)

## 2014-02-25 LAB — CBC
HCT: 45.2 % (ref 39.0–52.0)
Hemoglobin: 15.7 g/dL (ref 13.0–17.0)
MCH: 32.6 pg (ref 26.0–34.0)
MCHC: 34.7 g/dL (ref 30.0–36.0)
MCV: 93.8 fL (ref 78.0–100.0)
Platelets: 74 10*3/uL — ABNORMAL LOW (ref 150–400)
RBC: 4.82 MIL/uL (ref 4.22–5.81)
RDW: 14.4 % (ref 11.5–15.5)
WBC: 4.2 10*3/uL (ref 4.0–10.5)

## 2014-02-25 LAB — HEPARIN LEVEL (UNFRACTIONATED)
HEPARIN UNFRACTIONATED: 0.8 [IU]/mL — AB (ref 0.30–0.70)
Heparin Unfractionated: 0.73 IU/mL — ABNORMAL HIGH (ref 0.30–0.70)

## 2014-02-25 LAB — MRSA PCR SCREENING: MRSA by PCR: NEGATIVE

## 2014-02-25 LAB — PRO B NATRIURETIC PEPTIDE: PRO B NATRI PEPTIDE: 578.6 pg/mL — AB (ref 0–125)

## 2014-02-25 MED ORDER — ASPIRIN 300 MG RE SUPP: 300.0000 mg | RECTAL | Status: AC

## 2014-02-25 MED ORDER — IOHEXOL 350 MG/ML SOLN
100.0000 mL | Freq: Once | INTRAVENOUS | Status: AC | PRN
  Administered 2014-02-25: 100 mL via INTRAVENOUS

## 2014-02-25 MED ORDER — SODIUM CHLORIDE 0.9 % IV SOLN
250.0000 mL | INTRAVENOUS | Status: DC | PRN
  Administered 2014-02-27: 1000 mL via INTRAVENOUS

## 2014-02-25 MED ORDER — SODIUM CHLORIDE 0.9 % IV SOLN: INTRAVENOUS | Status: DC

## 2014-02-25 MED ORDER — ASPIRIN 81 MG PO CHEW: 324.0000 mg | CHEWABLE_TABLET | ORAL | Status: AC

## 2014-02-25 MED ORDER — SODIUM CHLORIDE 0.9 % IV BOLUS (SEPSIS): 500.0000 mL | Freq: Once | INTRAVENOUS | Status: DC

## 2014-02-25 MED ORDER — HEPARIN (PORCINE) IN NACL 100-0.45 UNIT/ML-% IJ SOLN
1400.0000 [IU]/h | INTRAMUSCULAR | Status: DC
  Administered 2014-02-25: 1500 [IU]/h via INTRAVENOUS
  Administered 2014-02-27: 1400 [IU]/h via INTRAVENOUS
  Filled 2014-02-25 (×6): qty 250

## 2014-02-25 MED ORDER — HEPARIN (PORCINE) IN NACL 100-0.45 UNIT/ML-% IJ SOLN
1700.0000 [IU]/h | INTRAMUSCULAR | Status: DC
  Administered 2014-02-25: 1700 [IU]/h via INTRAVENOUS
  Filled 2014-02-25 (×2): qty 250

## 2014-02-25 MED ORDER — WARFARIN SODIUM 5 MG PO TABS
5.0000 mg | ORAL_TABLET | Freq: Every day | ORAL | Status: DC
  Administered 2014-02-25 – 2014-02-28 (×4): 5 mg via ORAL
  Filled 2014-02-25 (×4): qty 1

## 2014-02-25 MED ORDER — INSULIN ASPART 100 UNIT/ML ~~LOC~~ SOLN
0.0000 [IU] | SUBCUTANEOUS | Status: DC
  Administered 2014-02-25: 7 [IU] via SUBCUTANEOUS
  Administered 2014-02-25: 4 [IU] via SUBCUTANEOUS
  Administered 2014-02-25: 15 [IU] via SUBCUTANEOUS
  Administered 2014-02-25: 4 [IU] via SUBCUTANEOUS
  Administered 2014-02-26: 11 [IU] via SUBCUTANEOUS
  Administered 2014-02-26: 3 [IU] via SUBCUTANEOUS
  Administered 2014-02-26: 4 [IU] via SUBCUTANEOUS
  Administered 2014-02-26: 3 [IU] via SUBCUTANEOUS

## 2014-02-25 MED ORDER — INSULIN ASPART 100 UNIT/ML ~~LOC~~ SOLN
2.0000 [IU] | SUBCUTANEOUS | Status: DC
  Administered 2014-02-25: 6 [IU] via SUBCUTANEOUS

## 2014-02-25 MED ORDER — INSULIN GLARGINE 100 UNIT/ML ~~LOC~~ SOLN
30.0000 [IU] | Freq: Every day | SUBCUTANEOUS | Status: DC
  Administered 2014-02-25 – 2014-02-27 (×4): 30 [IU] via SUBCUTANEOUS
  Filled 2014-02-25 (×5): qty 0.3

## 2014-02-25 MED ORDER — WARFARIN - PHYSICIAN DOSING INPATIENT
Freq: Every day | Status: DC
  Administered 2014-02-25 – 2014-02-26 (×2)

## 2014-02-25 MED ORDER — HEPARIN BOLUS VIA INFUSION
5000.0000 [IU] | Freq: Once | INTRAVENOUS | Status: AC
  Administered 2014-02-25: 5000 [IU] via INTRAVENOUS
  Filled 2014-02-25: qty 5000

## 2014-02-25 NOTE — ED Notes (Signed)
Pt reporting decreased shortness of breath with NRB.  Pt sating 100% on NRB.  MD requesting that pt remain on NRB due to bilateral PEs.

## 2014-02-25 NOTE — Progress Notes (Signed)
Name: Travis Rollins MRN: [id number] DOB: 1966/08/11    LOS: 1  Referring Provider:  Dr. Dina Rich  Reason for Referral:  Pulmonary embolism with large clot burden and increasing O2 requirement   Patient description:  [name] is a 48 y/o man with a past medical history of 2 prior PEs now admitted for new PE, DM2, LE edema and venous stasus and hyperlipidemia who had an episode of chest pain and syncope similar to prior PE.  Pt ran out of warfarin for 5 days.  He has had two previous PEs, one in 2013 about 6 months after stopping his warfarin and one about 8 years ago.   Lines/tubes PIV  Culture data None  ABX none Tests/events  CT 3/20: 1. Bilateral proximal occlusive pulmonary emboli extending into the left and right upper and lower lobes. Overall clot burden is severe. 2. Abnormal right ventricular to left ventricular ratio consistent with right heart strain.  ECHO 3/21:>>>   Current Status/subjective Breathing a little easier   Vital Signs: Temp:  [96.3 F (35.7 C)-97.4 F (36.3 C)] 96.3 F (35.7 C) (03/21 0246) Pulse Rate:  [97-126] 97 (03/21 0700) Resp:  [17-37] 23 (03/21 0700) BP: (73-134)/(43-98) 116/90 mmHg (03/21 0700) SpO2:  [89 %-100 %] 100 % (03/21 0700) FiO2 (%):  [100 %] 100 % (03/21 0300) Weight:  [131.09 kg (289 lb)] 131.09 kg (289 lb) (03/21 0246)  Physical Examination: General:  Obese man no acute distress.  Neuro:  Alert and oriented x3 HEENT:  PERRL, EOMI, OP clear, NRB in place Neck:  Supple, no masses Cardiovascular:   Tachycardic, regular rhythm, no mrg Lungs:  CTAB, no wrr Abdomen:  Obese, soft, NTND  Musculoskeletal:  Venous stasis color changes on lower extremities, + edema left LE, no clubbing, no joint abnormalities Skin:  No rashes or other lesions.   Recent Labs Lab 02/24/14 2202 02/25/14 0505  NA 137 136*  K 4.4 5.7*  CL 95* 99  CO2 25 22  BUN 18 19  CREATININE 0.97 0.91  GLUCOSE 374* 409*    Recent Labs Lab  02/24/14 2202 02/25/14 0325  HGB 15.5 15.7  HCT 44.8 45.2  WBC 4.1 4.2  PLT 78* 74*   CBG (last 3)   Recent Labs  02/24/14 2131 02/25/14 0259  GLUCAP 389* 465*   PCXR vasc congestion/ low vol.  See CT above   Active Problems:   PE (pulmonary thromboembolism)   ASSESSMENT AND PLAN  PULMONARY A:    Acute respiratory failure in setting of acute Pulmonary embolus with large clot burden, syncope and hypotension on presentation Recurrent large PEs Pt had been off Warfarin for about a month and restarted it about 4-5 days ago. INR was sub therapeutic,.  INR on admission 1.87. Pt likely hypercoaguable when restarting warfarin due to decrease in protein C and S.  P: heparin per pharmacy consult. Continue warfarin with daily INR Current use of warfarin with INR greater than 1.7 is a relative contraindication to lytic therapy.  As blood pressure has improved after 1L NS will not treat with lytics currently.  Should pt develop hemodynamic instability will reconsider either systemic lytic therapy or referral to VIR for targeted lytics.   CARDIOVASCULAR  A: Risk of right heart failure and pulmonary hypertension ECG:  Tachycardia - questionable evidence of right heart strain  P:  F/u Echo to evaluate function. Hold statin for the time being.   RENAL A: Mild hyperkalemia P: Repeat bmp Tele  Gastrointestinal  A: No acute P: NPO for now. Adv diet later  when FIO2 requirements better   HEMATOLOGIC A:   Acute PE prob LLE DVT Thrombocytopenia Not clear if this is consumptive as a result of large clot or chronic, was also present with last PE P: Trend coags Cont anticoagulation-->lifelong  Consider heme consult if PLTs cont to drop over next 24-48hrs   ENDOCRINE A:   DM2  W/ hyperglycemia Takes metformin as outpt P:   Has been out of lantus Hold metformin during hospitalization and for at least 48 hours after CT with contrast  Will put him on resistant  scale Started lantus 30U qhs, titrate as needed  Infectious disease A:no acute P:  Trend fever curve.   Still High FIO2 requirements. Anticipate that this will improve w/ time. Will keep in ICU for now. Other acute issues for today will be to f/u chemistry re: hyperkalemia and better manage glucose-->will place on resistant scale   CC 26min Asencion Noble MD Beeper  [phone]  Cell  [phone]  If no response or cell goes to voicemail, call beeper [phone]  Pulmonary and [place] Pager: [phone]  02/25/2014, 7:51 AM

## 2014-02-25 NOTE — Progress Notes (Signed)
ANTICOAGULATION CONSULT NOTE - Initial Consult  Pharmacy Consult for Heparin  Indication: pulmonary embolus  Allergies  Allergen Reactions  . Penicillins Hives    Patient Measurements: Pt stated weight: 297 lbs Heparin dosing weight: 101 kg  Vital Signs: Temp: 97.3 F (36.3 C) (03/21 0754) Temp src: Oral (03/21 0754) BP: 116/90 mmHg (03/21 0700) Pulse Rate: 97 (03/21 0700)  Labs:  Recent Labs  02/24/14 2202 02/25/14 0325 02/25/14 0505 02/25/14 0805  HGB 15.5 15.7  --   --   HCT 44.8 45.2  --   --   PLT 78* 74*  --   --   LABPROT 21.0*  --   --   --   INR 1.87*  --   --   --   HEPARINUNFRC  --   --   --  0.80*  CREATININE 0.97  --  0.91 0.88    Medical History: Past Medical History  Diagnosis Date  . Hypertension   . Deep vein thrombosis   . Obesity   . Diabetes mellitus   . Peripheral vascular disease     6 yrs ago, DVT in Lt knee/ groin  . Shortness of breath   . Anemia     Medications:  Warfarin PTA  Assessment: 48 y/o M on warfarin for PE around 2 yrs ago per MD notes, also has h/o of DVT. INR is sub-therapeutic on admit at 1.87 and pt is found to have BILATERAL PE with SEVERE clot burden. Other labs as above.  HL this AM is 0.8 and supratherapeutic.  Will decrease heparin dose by ~1-2 units/kg and recheck level in 6 hours.  Goal of Therapy:  Heparin level 0.3-0.7 units/ml Monitor platelets by anticoagulation protocol: Yes   Plan:  -Decrease heparin drip to 1500 units/hr  -6 hour HL at 1600 -Daily CBC/HL -Monitor for bleeding  Jeronimo Norma 02/25/2014,9:51 AM

## 2014-02-25 NOTE — Progress Notes (Signed)
Pt admitted to 2S13 from ED.  On pt worklist not completed were ASA 324mg , Lantus 30units and Novolog SS.  Per ED RN prior mentioned meds not given.  ASA was not given due to EMS gave on truck.  Checked pt CBG on arrival.  CBG=465.  Spoke with Dr. Jimmy Footman in reference to increase CBG but not coverage given yet.  She advised to cover now with SSI and give Lantus.  Will continue to monitor.

## 2014-02-25 NOTE — Progress Notes (Signed)
ANTICOAGULATION CONSULT NOTE - Initial Consult  Pharmacy Consult for Heparin  Indication: pulmonary embolus  Allergies  Allergen Reactions  . Penicillins Hives    Patient Measurements: Pt stated weight: 297 lbs Heparin dosing weight: 101 kg  Vital Signs: Temp: 97.4 F (36.3 C) (03/20 2126) BP: 113/75 mmHg (03/21 0046) Pulse Rate: 107 (03/21 0046)  Labs:  Recent Labs  02/24/14 2202  HGB 15.5  HCT 44.8  PLT 78*  LABPROT 21.0*  INR 1.87*  CREATININE 0.97    Medical History: Past Medical History  Diagnosis Date  . Hypertension   . Deep vein thrombosis   . Obesity   . Diabetes mellitus   . Peripheral vascular disease     6 yrs ago, DVT in Lt knee/ groin  . Shortness of breath   . Anemia     Medications:  Warfarin PTA  Assessment: 48 y/o M on warfarin for PE around 2 yrs ago per MD notes, also has h/o of DVT. INR is sub-therapeutic on admit at 1.87 and pt is found to have BILATERAL PE with SEVERE clot burden. Other labs as above.   Goal of Therapy:  Heparin level 0.3-0.7 units/ml Monitor platelets by anticoagulation protocol: Yes   Plan:  -Heparin 5000 units BOLUS x 1  -Start heparin drip at 1700 units/hr  -6 hour HL at 0700 -Daily CBC/HL -Monitor for bleeding  Narda Bonds 02/25/2014,12:50 AM

## 2014-02-25 NOTE — ED Notes (Signed)
Respiratory at bedside obtaining ABG.

## 2014-02-25 NOTE — Progress Notes (Signed)
ANTICOAGULATION CONSULT NOTE - [place] for Heparin  Indication: pulmonary embolus  Allergies  Allergen Reactions  . Penicillins Hives    Patient Measurements: Pt stated weight: 297 lbs Heparin dosing weight: 101 kg  Vital Signs: Temp: 97.5 F (36.4 C) (03/21 1618) Temp src: Axillary (03/21 1618) BP: 142/97 mmHg (03/21 1900) Pulse Rate: 89 (03/21 1900)  Labs:  Recent Labs  02/24/14 2202 02/25/14 0325 02/25/14 0505 02/25/14 0805 02/25/14 1832  HGB 15.5 15.7  --   --   --   HCT 44.8 45.2  --   --   --   PLT 78* 74*  --   --   --   LABPROT 21.0*  --   --   --   --   INR 1.87*  --   --   --   --   HEPARINUNFRC  --   --   --  0.80* 0.73*  CREATININE 0.97  --  0.91 0.88  --     Medical History: Past Medical History  Diagnosis Date  . Hypertension   . Deep vein thrombosis   . Obesity   . Diabetes mellitus   . Peripheral vascular disease     6 yrs ago, DVT in Lt knee/ groin  . Shortness of breath   . Anemia     Medications:  Warfarin PTA  Assessment: 48 y/o M with significant history of VTE including 2 previous PEs.  Pt was restarting warfarin (~ 4-5d PTA) after stopping for ~ 1 month.  Pt noted increased CP and came to the ED where he was found to have BILATERAL PE with SEVERE clot burden.  Heparin level is slightly above goal on 1500 units/hr.  No bleeding noted.  Goal of Therapy:  Heparin level 0.3-0.7 units/ml Monitor platelets by anticoagulation protocol: Yes   Plan:  -Decrease heparin drip to 1400 units/hr  -Heparin level and CBC with AM labs. -Monitor for bleeding  [employer], Pharm.D., BCPS Clinical Pharmacist Pager [phone] 02/25/2014 7:46 PM

## 2014-02-25 NOTE — ED Notes (Signed)
Pt with increased labor of breathing.  Pt stating he is having more trouble breathing than when he first got here.  Pt on 5L [place] sating between 92-94%.   EDP made aware.  At bedside to reassess pt.

## 2014-02-25 NOTE — ED Notes (Signed)
Respiratory notified of ABG. 

## 2014-02-25 NOTE — ED Notes (Signed)
Nonrebreather placed on pt per MD request.

## 2014-02-25 NOTE — H&P (Signed)
Name: Travis Rollins MRN: [id number] DOB: [birth year]    LOS: 1  Referring Provider:  Dr. Dina Rich  Reason for Referral:  Pulmonary embolism with large clot burden and increasing O2 requirement  PULMONARY / CRITICAL CARE MEDICINE  HPI:  [name] is a 48 y/o man with a past medical history of 2 prior PEs, DM2, LE edema and venous stasus and hyperlipidemia who had an episode of chest pain and syncope similar to prior PE.  He was brought by EMS to the [facility] ED.  He reports that he had been on Warfarin since his previous PE until early February when he ran out of the medication.  He recently saw his primary care doctor and was able to get a new prescription.  He started taking it again about 4-5 days prior to admission.  He has also been out of his lantus.  He has been taking his metformin, lasix and statin.  He has had two previous PEs, one in 2013 about 6 months after stopping his warfarin and one about 8 years ago.   Past Medical History  Diagnosis Date  . Hypertension   . Deep vein thrombosis   . Obesity   . Diabetes mellitus   . Peripheral vascular disease     6 yrs ago, DVT in Lt knee/ groin  . Shortness of breath   . Anemia    Past Surgical History  Procedure Laterality Date  . Tonsillectomy     Prior to Admission medications   Medication Sig Start Date End Date Taking? Authorizing Provider  Cholecalciferol (VITAMIN D3) 5000 UNITS CAPS Take 5,000 Units by mouth every evening.    Yes Historical Provider, MD  furosemide (LASIX) 40 MG tablet Take 40 mg by mouth every morning.    Yes Historical Provider, MD  insulin aspart (NOVOLOG FLEXPEN) 100 UNIT/ML FlexPen Inject 10 Units into the skin 3 (three) times daily with meals.   Yes Historical Provider, MD  Insulin Glargine (LANTUS SOLOSTAR) 100 UNIT/ML Solostar Pen Inject 100 Units into the skin daily at 10 pm.    Yes Historical Provider, MD  lovastatin (MEVACOR) 20 MG tablet Take 20 mg by mouth at bedtime.   Yes Historical Provider,  MD  metFORMIN (GLUCOPHAGE) 500 MG tablet Take 500 mg by mouth 2 (two) times daily with a meal.   Yes Historical Provider, MD  niacin 500 MG CR capsule Take 500 mg by mouth daily.   Yes Historical Provider, MD  warfarin (COUMADIN) 5 MG tablet Take 5-7.5 mg by mouth See admin instructions. Takes 7.5mg  daily on Mon, Wed, Fri Takes 5mg  on Tues and Thurs DOES NOT TAKE ON SAT OR SUN   Yes Historical Provider, MD   Allergies Allergies  Allergen Reactions  . Penicillins Hives    Family History History reviewed. No pertinent family history. Social History  reports that he has never smoked. He has never used smokeless tobacco. He reports that he does not drink alcohol or use illicit drugs.  Review Of Systems:  A review of 14 systems was negative except as stated in the HPI.  Brief patient description:  48 y/o with recurrent massive PEs  Events Since Admission: CT 3/20 - Bilateral pulmonary emboli in left and right mainstem pulmonary arteries  Current Status: guarded  Vital Signs: Temp:  [97.4 F (36.3 C)] 97.4 F (36.3 C) (03/20 2126) Pulse Rate:  [101-126] 101 (03/21 0115) Resp:  [19-37] 25 (03/21 0115) BP: (73-134)/(43-98) 112/81 mmHg (03/21 0115) SpO2:  [89 %-100 %]  100 % (03/21 0115)  Physical Examination: General:  Obese man laying on stretcher, mild distress Neuro:  Alert and oriented x3 HEENT:  PERRL, EOMI, OP clear, NRB in place Neck:  Supple, no masses Cardiovascular:   Tachycardic, regular rhythm, no mrg Lungs:  CTAB, no wrr Abdomen:  Obese, soft, NTND  Musculoskeletal:  Venous stasis color changes on lower extremities, no edema, no clubbing, no joint abnormalities Skin:  No rashes or other lesions.   Active Problems:   PE (pulmonary thromboembolism)   ASSESSMENT AND PLAN  PULMONARY  Recent Labs Lab 02/25/14 0047  PHART 7.355  PCO2ART 40.6  PO2ART 332.0*  HCO3 22.8  O2SAT 100.0   Ventilator Settings:   CT 3/20:  IMPRESSION:  1. Bilateral proximal  occlusive pulmonary emboli extending into the  left and right upper and lower lobes. Overall clot burden is severe.  2. Abnormal right ventricular to left ventricular ratio consistent  with right heart strain.   A:  Pulmonary embolus with large clot burden, syncope and hypotension on presentation P:   Recurrent large PEs Pt had been off Warfarin for about a month and restarted it about 4-5 days ago. INR on admission 1.87 Pt likely hypercoaguable when restarting warfarin due to decrease in protein C and S. Started heparin per pharmacy consult. Continue warfarin with daily INR Current use of warfarin with INR greater than 1.7 is a relative contraindication to lytic therapy.  As blood pressure has improved after 1L NS will not treat with lytics currently.  Should pt develop hemodynamic instability will reconsider either systemic lytic therapy or referral to VIR for targeted lytics.   CARDIOVASCULAR  Recent Labs Lab 02/25/14 0108  PROBNP 578.6*   ECG:  Tachycardia - questionable evidence of right heart strain  Lines: PIV x2  A: Risk of right heart failure and pulmonary hypertension P:  Echo in AM to evaluate function. Hold statin for the time being.    HEMATOLOGIC  Recent Labs Lab 02/24/14 2202  HGB 15.5  HCT 44.8  PLT 78*  INR 1.87*   A:  Thrombocytopenia P:  Not clear if this is consumptive as a result of large clot or chronic, was also present with last PE Consider heme consult   ENDOCRINE  Recent Labs Lab 02/24/14 2131  GLUCAP 389*   A:  DM2   P:   Has been out of lantus Takes metformin as outpt Hold metformin during hospitalization and for at least 48 hours after CT with contrast Glucose stabilizer  Start lantus 30U qhs, titrate as needed   BEST PRACTICE / DISPOSITION Level of Care:  ICU Primary Service:  PCCM Consultants:  none Code Status:  Full  Diet:  NPO for now DVT Px:  On full dose anticoagulation GI Px:  Not indicated  Skin Integrity:   good Social / Family:  Family at bedside and updated to plan.   I spent 35 minutes of critical care time in the care of this patient seperate from procedures which are documented elsewhere     Lincoln Maxin, Mariann Laster., M.D. [facility] Pager: [phone]  02/25/2014, 1:46 AM

## 2014-02-25 NOTE — ED Provider Notes (Addendum)
CSN: [id number]     Arrival date & time 02/24/14  2121 History   First MD Initiated Contact with Patient 02/24/14 2141     Chief Complaint  Patient presents with  . Loss of Consciousness     (Consider location/radiation/quality/duration/timing/severity/associated sxs/prior Treatment) HPI  This is a 71 yom with a history of hypertension, CT/PE, obesity, and diabetes who presents with syncope. Patient reports syncopal episode after walking out to get the garbage earlier today. Since that time he has had progressive shortness of breath. Patient states episode is similar to when he had a PE 2 years ago. He is currently on Coumadin. He denies any fevers or cough. Patient denies any chest pain. Of note, initial vital signs notable for heart rate of 116, blood pressure 73/43.  He does not appear to be in any respiratory distress.  He is mildly tachypneic. He reports generalized weakness.  Past Medical History  Diagnosis Date  . Hypertension   . Deep vein thrombosis   . Obesity   . Diabetes mellitus   . Peripheral vascular disease     6 yrs ago, DVT in Lt knee/ groin  . Shortness of breath   . Anemia    Past Surgical History  Procedure Laterality Date  . Tonsillectomy     History reviewed. No pertinent family history. History  Substance Use Topics  . Smoking status: Never Smoker   . Smokeless tobacco: Never Used  . Alcohol Use: No    Review of Systems  Constitutional: Negative.  Negative for fever.  Respiratory: Positive for shortness of breath. Negative for chest tightness.   Cardiovascular: Positive for leg swelling. Negative for chest pain.  Gastrointestinal: Negative.  Negative for abdominal pain.  Genitourinary: Negative.  Negative for dysuria.  Musculoskeletal: Negative for back pain.  Skin: Negative for rash.  Neurological: Positive for syncope. Negative for headaches.  All other systems reviewed and are negative.      Allergies  Penicillins  Home Medications    Current Outpatient Rx  Name  Route  Sig  Dispense  Refill  . Cholecalciferol (VITAMIN D3) 5000 UNITS CAPS   Oral   Take 5,000 Units by mouth every evening.          . furosemide (LASIX) 40 MG tablet   Oral   Take 40 mg by mouth every morning.          . insulin aspart (NOVOLOG FLEXPEN) 100 UNIT/ML FlexPen   Subcutaneous   Inject 10 Units into the skin 3 (three) times daily with meals.         . Insulin Glargine (LANTUS SOLOSTAR) 100 UNIT/ML Solostar Pen   Subcutaneous   Inject 100 Units into the skin daily at 10 pm.          . lovastatin (MEVACOR) 20 MG tablet   Oral   Take 20 mg by mouth at bedtime.         . metFORMIN (GLUCOPHAGE) 500 MG tablet   Oral   Take 500 mg by mouth 2 (two) times daily with a meal.         . niacin 500 MG CR capsule   Oral   Take 500 mg by mouth daily.         Marland Kitchen warfarin (COUMADIN) 5 MG tablet   Oral   Take 5-7.5 mg by mouth See admin instructions. Takes 7.5mg  daily on Mon, Wed, Fri Takes 5mg  on Tues and Thurs DOES NOT TAKE ON SAT OR SUN  BP 113/75  Pulse 107  Temp(Src) 97.4 F (36.3 C)  Resp 28  SpO2 100% Physical Exam  Nursing note and vitals reviewed. Constitutional: He is oriented to person, place, and time. No distress.  Morbidly obese  HENT:  Head: Normocephalic and atraumatic.  Eyes: Pupils are equal, round, and reactive to light.  Neck: Neck supple. JVD present.  Cardiovascular: Regular rhythm and normal heart sounds.   No murmur heard. Tachycardia  Pulmonary/Chest: Breath sounds normal. No respiratory distress. He has no wheezes. He exhibits no tenderness.  Tachypnea  Abdominal: Soft. Bowel sounds are normal. There is no tenderness. There is no rebound.  Musculoskeletal: He exhibits no edema.  Left greater than right lower extremity swelling  Lymphadenopathy:    He has no cervical adenopathy.  Neurological: He is alert and oriented to person, place, and time.  Skin: Skin is warm and dry.   Psychiatric: He has a normal mood and affect.    ED Course  Procedures (including critical care time)  CRITICAL CARE Performed by: Thayer Jew, F   Total critical care time: 45 min  Critical care time was exclusive of separately billable procedures and treating other patients.  Critical care was necessary to treat or prevent imminent or life-threatening deterioration.  Critical care was time spent personally by me on the following activities: development of treatment plan with patient and/or surrogate as well as nursing, discussions with consultants, evaluation of patient's response to treatment, examination of patient, obtaining history from patient or surrogate, ordering and performing treatments and interventions, ordering and review of laboratory studies, ordering and review of radiographic studies, pulse oximetry and re-evaluation of patient's condition.  Labs Review Labs Reviewed  CBC WITH DIFFERENTIAL - Abnormal; Notable for the following:    Platelets 78 (*)    All other components within normal limits  BASIC METABOLIC PANEL - Abnormal; Notable for the following:    Chloride 95 (*)    Glucose, Bld 374 (*)    All other components within normal limits  PROTIME-INR - Abnormal; Notable for the following:    Prothrombin Time 21.0 (*)    INR 1.87 (*)    All other components within normal limits  CBG MONITORING, ED - Abnormal; Notable for the following:    Glucose-Capillary 389 (*)    All other components within normal limits  I-STAT ARTERIAL BLOOD GAS, ED - Abnormal; Notable for the following:    pO2, Arterial 332.0 (*)    Acid-base deficit 3.0 (*)    All other components within normal limits  HEPARIN LEVEL (UNFRACTIONATED)  I-STAT TROPOININ, ED   Imaging Review Ct Angio Chest W/cm &/or Wo Cm  02/25/2014   CLINICAL DATA:  Chest pressure and shortness of Breath. Bilateral lower extremity swelling  EXAM: CT ANGIOGRAPHY CHEST WITH CONTRAST  TECHNIQUE: Multidetector CT  imaging of the chest was performed using the standard protocol during bolus administration of intravenous contrast. Multiplanar CT image reconstructions and MIPs were obtained to evaluate the vascular anatomy.  CONTRAST:  174mL OMNIPAQUE IOHEXOL 350 MG/ML SOLN  COMPARISON:  DG CHEST 1V PORT dated 02/24/2014; CT ANGIO CHEST W/CM &/OR WO/CM dated 03/20/2012 neck  FINDINGS: There are occlusive filling defects within the proximal left and right main pulmonary arteries extend into the interlobar pulmonary arteries. The thromboemboli extend into the lower lobe pulmonary arteries, right middle lobe, and upper lobe pulmonary arteries on the left and right. Overall clot burden is severe.  Right ventricular to  left ventricular ratio equals  1.75.  There is no acute findings of the aorta or great vessels. No pericardial fluid.  Review of the lung parenchyma demonstrates no pulmonary infarction. No pulmonary edema.  No axillary or supraclavicular lymphadenopathy. No view of the upper abdomen is unremarkable. No aggressive osseous lesion.  Review of the MIP images confirms the above findings.  IMPRESSION: 1. Bilateral proximal occlusive pulmonary emboli extending into the left and right upper and lower lobes. Overall clot burden is severe. 2. Abnormal right ventricular to left ventricular ratio consistent with right heart strain. Critical Value/emergent results were called by telephone at the time of interpretation on 02/25/2014 at 12:36 AM to Dr. Thayer Jew , who verbally acknowledged these results.   Electronically Signed   By: Suzy Bouchard M.D.   On: 02/25/2014 00:39   Dg Chest Portable 1 View  02/24/2014   CLINICAL DATA:  Two syncopal episodes today. Current history of hypertension diabetes.  EXAM: PORTABLE CHEST - 1 VIEW  COMPARISON:  DG CHEST 1V PORT dated 03/21/2012; CT ANGIO CHEST W/CM &/OR WO/CM dated 03/20/2012  FINDINGS: Suboptimal inspiration due to body habitus accounts for crowded bronchovascular markings,  especially in the bases, and accentuates the cardiac silhouette. Taking this into account, cardiac silhouette mildly enlarged but stable. Lungs clear. Bronchovascular markings normal. Pulmonary vascularity normal. No visible pleural effusions. No pneumothorax.  IMPRESSION: Suboptimal inspiration. No acute cardiopulmonary disease.   Electronically Signed   By: Evangeline Dakin M.D.   On: 02/24/2014 23:49     EKG Interpretation   Date/Time:  Friday February 24 2014 21:43:11 EDT Ventricular Rate:  112 PR Interval:  133 QRS Duration: 85 QT Interval:  373 QTC Calculation: 509 R Axis:   66 Text Interpretation:  Sinus tachycardia Borderline T wave abnormalities  Prolonged QT interval Confirmed by Knut Rondinelli  MD, Jaqueline Uber ([id number]) on  02/24/2014 9:53:32 PM      MDM   Final diagnoses:  Pulmonary embolism   Patient presents with syncope and shortness of breath. Vital signs notable for tachycardia and hypotension. Patient states this is similar to prior PE. Patient was given a fluid bolus with improvement of his blood pressures. Last blood pressure systolic 604. Lab work obtained including creatinine. CTA ordered. Patient has had progressive oxygen requirement. Currently on a nonrebreather. CT shows evidence of large bilateral PEs.  Patient's blood pressure has stabilized following fluid administration; however, it is at risk for cardiovascular compromise and collapse. Heparin ordered. Discussed with critical care medicine. They will evaluate for admission.    Merryl Hacker, MD 02/25/14 0100  Merryl Hacker, MD 02/25/14 [phone]

## 2014-02-25 NOTE — Progress Notes (Signed)
UR completed 

## 2014-02-26 ENCOUNTER — Inpatient Hospital Stay (HOSPITAL_COMMUNITY): Payer: Medicare Other

## 2014-02-26 DIAGNOSIS — I2699 Other pulmonary embolism without acute cor pulmonale: Principal | ICD-10-CM

## 2014-02-26 DIAGNOSIS — D696 Thrombocytopenia, unspecified: Secondary | ICD-10-CM | POA: Diagnosis present

## 2014-02-26 DIAGNOSIS — E1129 Type 2 diabetes mellitus with other diabetic kidney complication: Secondary | ICD-10-CM

## 2014-02-26 DIAGNOSIS — I369 Nonrheumatic tricuspid valve disorder, unspecified: Secondary | ICD-10-CM

## 2014-02-26 DIAGNOSIS — J9600 Acute respiratory failure, unspecified whether with hypoxia or hypercapnia: Secondary | ICD-10-CM

## 2014-02-26 LAB — GLUCOSE, CAPILLARY
GLUCOSE-CAPILLARY: 137 mg/dL — AB (ref 70–99)
GLUCOSE-CAPILLARY: 171 mg/dL — AB (ref 70–99)
GLUCOSE-CAPILLARY: 255 mg/dL — AB (ref 70–99)
Glucose-Capillary: 136 mg/dL — ABNORMAL HIGH (ref 70–99)
Glucose-Capillary: 132 mg/dL — ABNORMAL HIGH (ref 70–99)
Glucose-Capillary: 167 mg/dL — ABNORMAL HIGH (ref 70–99)

## 2014-02-26 LAB — CBC
HCT: 41.1 % (ref 39.0–52.0)
Hemoglobin: 13.9 g/dL (ref 13.0–17.0)
MCH: 32 pg (ref 26.0–34.0)
MCHC: 33.8 g/dL (ref 30.0–36.0)
MCV: 94.5 fL (ref 78.0–100.0)
PLATELETS: 78 10*3/uL — AB (ref 150–400)
RBC: 4.35 MIL/uL (ref 4.22–5.81)
RDW: 14.4 % (ref 11.5–15.5)
WBC: 6.1 10*3/uL (ref 4.0–10.5)

## 2014-02-26 LAB — PROTIME-INR
INR: 2.64 — ABNORMAL HIGH (ref 0.00–1.49)
Prothrombin Time: 27.3 seconds — ABNORMAL HIGH (ref 11.6–15.2)

## 2014-02-26 LAB — BASIC METABOLIC PANEL
BUN: 16 mg/dL (ref 6–23)
CO2: 24 mEq/L (ref 19–32)
Calcium: 9 mg/dL (ref 8.4–10.5)
Chloride: 101 mEq/L (ref 96–112)
Creatinine, Ser: 0.73 mg/dL (ref 0.50–1.35)
Glucose, Bld: 146 mg/dL — ABNORMAL HIGH (ref 70–99)
POTASSIUM: 4 meq/L (ref 3.7–5.3)
Sodium: 140 mEq/L (ref 137–147)

## 2014-02-26 LAB — HEPARIN LEVEL (UNFRACTIONATED)
HEPARIN UNFRACTIONATED: 0.45 [IU]/mL (ref 0.30–0.70)
Heparin Unfractionated: 0.51 IU/mL (ref 0.30–0.70)

## 2014-02-26 MED ORDER — INSULIN ASPART 100 UNIT/ML ~~LOC~~ SOLN
0.0000 [IU] | Freq: Three times a day (TID) | SUBCUTANEOUS | Status: DC
Start: 2014-02-27 — End: 2014-02-28
  Administered 2014-02-27: 15 [IU] via SUBCUTANEOUS
  Administered 2014-02-27: 7 [IU] via SUBCUTANEOUS
  Administered 2014-02-27: 15 [IU] via SUBCUTANEOUS
  Administered 2014-02-28 (×2): 11 [IU] via SUBCUTANEOUS
  Administered 2014-02-28: 7 [IU] via SUBCUTANEOUS

## 2014-02-26 MED ORDER — HYDROCODONE-ACETAMINOPHEN 5-325 MG PO TABS
1.0000 | ORAL_TABLET | Freq: Four times a day (QID) | ORAL | Status: DC | PRN
  Administered 2014-02-26 – 2014-02-27 (×3): 1 via ORAL
  Filled 2014-02-26 (×3): qty 1

## 2014-02-26 NOTE — Progress Notes (Signed)
02/26/14  Pharmacy- Heparin 1545  Heparin level 0.45 on 1400 units/hr  A/P:  48yo male with (+)PE and probable DVT.  Heparin level therapeutic x 2, no bleeding problems noted.  1-  Continue current rate 2-  F/U in AM  Gracy Bruins, PharmD [facility]

## 2014-02-26 NOTE — Progress Notes (Signed)
Name: Travis Rollins MRN: [id number] DOB: 26-Oct-1966    LOS: 2  Referring Provider:  Dr. Dina Rich  Reason for Referral:  Pulmonary embolism with large clot burden and increasing O2 requirement   Patient description:  [name] is a 48 y/o man with a past medical history of 2 prior PEs now admitted for new PE, DM2, LE edema and venous stasus and hyperlipidemia who had an episode of chest pain and syncope similar to prior PE.  Pt ran out of warfarin for 5 days.  He has had two previous PEs, one in 2013 about 6 months after stopping his warfarin and one about 8 years ago.   Lines/tubes PIV  Culture data None  ABX none Tests/events  CT 3/20: 1. Bilateral proximal occlusive pulmonary emboli extending into the left and right upper and lower lobes. Overall clot burden is severe. 2. Abnormal right ventricular to left ventricular ratio consistent with right heart strain.  ECHO 3/21:>>>   Current Status/subjective Breathing a little easier   Vital Signs: Temp:  [97.2 F (36.2 C)-98.5 F (36.9 C)] 97.3 F (36.3 C) (03/22 0750) Pulse Rate:  [82-112] 93 (03/22 0900) Resp:  [13-32] 13 (03/22 0900) BP: (116-145)/(82-112) 127/82 mmHg (03/22 0900) SpO2:  [91 %-100 %] 96 % (03/22 0900) FiO2 (%):  [35 %-80 %] 35 % (03/22 0637) Weight:  [131.8 kg (290 lb 9.1 oz)] 131.8 kg (290 lb 9.1 oz) (03/22 0600) 3 liters  Physical Examination: General:  Obese man no acute distress.  Neuro:  Alert and oriented x3 HEENT:  PERRL, EOMI, OP clear Neck:  Supple, no masses Cardiovascular:   Tachycardic, regular rhythm, no mrg Lungs:  CTAB, no wrr Abdomen:  Obese, soft, NTND  Musculoskeletal:  Venous stasis color changes on lower extremities, + edema left LE, no clubbing, no joint abnormalities. LUE painful to palp.  Skin:  No rashes or other lesions.   Recent Labs Lab 02/25/14 0505 02/25/14 0805 02/26/14 0325  NA 136* 136* 140  K 5.7* 6.1* 4.0  CL 99 98 101  CO2 22 20 24   BUN 19 20 16     CREATININE 0.91 0.88 0.73  GLUCOSE 409* 346* 146*    Recent Labs Lab 02/24/14 2202 02/25/14 0325 02/26/14 0325  HGB 15.5 15.7 13.9  HCT 44.8 45.2 41.1  WBC 4.1 4.2 6.1  PLT 78* 74* 78*   CBG (last 3)   Recent Labs  02/26/14 0042 02/26/14 0402 02/26/14 0748  GLUCAP 136* 137* 171*   PCXR vasc congestion/ low vol.  See CT above   Principal Problem:   PE (pulmonary thromboembolism) Active Problems:   DM (diabetes mellitus), type 2 with renal complications   Hyperkalemia   Acute respiratory failure with hypoxia   ASSESSMENT AND PLAN  PULMONARY A:    Acute respiratory failure in setting of acute Pulmonary embolus with large clot burden, syncope and hypotension on presentation Recurrent large PEs Pt had been off Warfarin for about a month and restarted it about 4-5 days ago. INR was sub therapeutic,.  INR on admission 1.87. Pt likely hypercoaguable when restarting warfarin due to decrease in protein C and S.  P: heparin and coumadin per pharmacy  Adv activity  Wean FIO2   CARDIOVASCULAR  A:  Risk of right heart failure and pulmonary hypertension ECG:  Tachycardia - questionable evidence of right heart strain  LUE swelling pain: not sure if this is IV infiltrate or DVT P:  F/u Echo to evaluate function. Hold statin for  the time being.   RENAL A: No acute  P: Trend chem   Gastrointestinal  A: No acute P: Adv diet   HEMATOLOGIC A:   Acute PE prob LLE DVT Thrombocytopenia Not clear if this is consumptive as a result of large clot or chronic, was also present with last PE. PLTs stable  P: Trend coags Cont anticoagulation-->lifelong  Consider heme consult if PLTs cont to drop over next 24-48hrs   ENDOCRINE A:   DM2  W/ hyperglycemia Takes metformin as outpt P:   Has been out of lantus Hold metformin Cont resistant scale Started lantus 30U qhs, titrate as needed  Infectious disease A:no acute P:  Trend fever curve.    Looking  better. Weaning O2. Will transfer to [facility]  [phone]  Cell  [phone]  If no response or cell goes to voicemail, call beeper [phone]  02/26/2014, 9:18 AM

## 2014-02-26 NOTE — Progress Notes (Signed)
  Echocardiogram 2D Echocardiogram has been performed.  Travis Rollins Travis Rollins 02/26/2014, 4:12 PM

## 2014-02-26 NOTE — Progress Notes (Addendum)
Pt c/o of pain in left arm, this am left arm is swollen and warm to the touch. Pt is on Heparin gtt. Elevated arm on pillow and notified Elink MD. Radial pulses 1+ and equal.

## 2014-02-26 NOTE — Progress Notes (Signed)
ANTICOAGULATION CONSULT NOTE - Follow Up Consult  Pharmacy Consult for Heparin Indication: pulmonary embolus and DVT-probable  Allergies  Allergen Reactions  . Penicillins Hives    Patient Measurements: Height: 5\' 6"  (167.6 cm) Weight: 290 lb 9.1 oz (131.8 kg) IBW/kg (Calculated) : 63.8 Heparin Dosing Weight:   Vital Signs: Temp: 97.3 F (36.3 C) (03/22 0750) Temp src: Oral (03/22 0750) BP: 127/86 mmHg (03/22 1100) Pulse Rate: 86 (03/22 1100)  Labs:  Recent Labs  02/24/14 2202 02/25/14 0325 02/25/14 0505 02/25/14 0805 02/25/14 1832 02/26/14 0325  HGB 15.5 15.7  --   --   --  13.9  HCT 44.8 45.2  --   --   --  41.1  PLT 78* 74*  --   --   --  78*  LABPROT 21.0*  --   --   --   --  27.3*  INR 1.87*  --   --   --   --  2.64*  HEPARINUNFRC  --   --   --  0.80* 0.73* 0.51  CREATININE 0.97  --  0.91 0.88  --  0.73    Estimated Creatinine Clearance: 148.5 ml/min (by C-G formula based on Cr of 0.73).   Medications:  Scheduled:  . insulin aspart  0-20 Units Subcutaneous 6 times per day  . insulin glargine  30 Units Subcutaneous QHS  . warfarin  5 mg Oral q1800  . Warfarin - Physician Dosing Inpatient   Does not apply q1800    Assessment: 48yo male with (+)PE and probable LLE-DVT, as well as LUE swelling/tenderness this AM- DVT or IV infiltrate?  Heparin level is therapeutic this AM.  Pltc 78- stable with admission level and c/w with previous admission pltc.  No bleeding noted.  Goal of Therapy:  Heparin level 0.3-0.7 units/ml Monitor platelets by anticoagulation protocol: Yes   Plan:  1-  Continue current rate 2-  Repeat HL to verify therapeutic  Gracy Bruins, PharmD Clinical Pharmacist [facility]

## 2014-02-26 NOTE — Progress Notes (Signed)
Transferred to 7N30 via wheelchair. Portable oxygen on. No changes. Report given to Robin,RN

## 2014-02-27 DIAGNOSIS — M79609 Pain in unspecified limb: Secondary | ICD-10-CM

## 2014-02-27 DIAGNOSIS — D696 Thrombocytopenia, unspecified: Secondary | ICD-10-CM

## 2014-02-27 LAB — CBC
HEMATOCRIT: 40 % (ref 39.0–52.0)
Hemoglobin: 13.7 g/dL (ref 13.0–17.0)
MCH: 31.7 pg (ref 26.0–34.0)
MCHC: 34.3 g/dL (ref 30.0–36.0)
MCV: 92.6 fL (ref 78.0–100.0)
Platelets: 83 10*3/uL — ABNORMAL LOW (ref 150–400)
RBC: 4.32 MIL/uL (ref 4.22–5.81)
RDW: 14.1 % (ref 11.5–15.5)
WBC: 4.7 10*3/uL (ref 4.0–10.5)

## 2014-02-27 LAB — PROTIME-INR
INR: 2.37 — ABNORMAL HIGH (ref 0.00–1.49)
Prothrombin Time: 25.1 seconds — ABNORMAL HIGH (ref 11.6–15.2)

## 2014-02-27 LAB — COMPREHENSIVE METABOLIC PANEL
ALT: 120 U/L — ABNORMAL HIGH (ref 0–53)
AST: 45 U/L — ABNORMAL HIGH (ref 0–37)
Albumin: 3.2 g/dL — ABNORMAL LOW (ref 3.5–5.2)
Alkaline Phosphatase: 72 U/L (ref 39–117)
BILIRUBIN TOTAL: 0.5 mg/dL (ref 0.3–1.2)
BUN: 13 mg/dL (ref 6–23)
CHLORIDE: 100 meq/L (ref 96–112)
CO2: 23 meq/L (ref 19–32)
Calcium: 8.9 mg/dL (ref 8.4–10.5)
Creatinine, Ser: 0.77 mg/dL (ref 0.50–1.35)
GFR calc Af Amer: >90 mL/min (ref >90)
GLUCOSE: 241 mg/dL — AB (ref 70–99)
Potassium: 4.1 mEq/L (ref 3.7–5.3)
Sodium: 138 mEq/L (ref 137–147)
Total Protein: 7.4 g/dL (ref 6.0–8.3)

## 2014-02-27 LAB — GLUCOSE, CAPILLARY
GLUCOSE-CAPILLARY: 225 mg/dL — AB (ref 70–99)
Glucose-Capillary: 221 mg/dL — ABNORMAL HIGH (ref 70–99)
Glucose-Capillary: 315 mg/dL — ABNORMAL HIGH (ref 70–99)
Glucose-Capillary: 311 mg/dL — ABNORMAL HIGH (ref 70–99)

## 2014-02-27 LAB — HEPARIN LEVEL (UNFRACTIONATED): Heparin Unfractionated: 0.5 IU/mL (ref 0.30–0.70)

## 2014-02-27 MED ORDER — METFORMIN HCL 500 MG PO TABS
500.0000 mg | ORAL_TABLET | Freq: Two times a day (BID) | ORAL | Status: DC
  Administered 2014-02-27 – 2014-02-28 (×3): 500 mg via ORAL
  Filled 2014-02-27 (×4): qty 1

## 2014-02-27 NOTE — Progress Notes (Signed)
Left upper extremity venous duplex:  DVT noted in the jugular and axillary veins.

## 2014-02-27 NOTE — Progress Notes (Signed)
PULMONARY / CRITICAL CARE MEDICINE  Name: Travis Rollins MRN: [id number] DOB: June 12, 1966  Referring Provider:  EDP  Reason for Referral:  Pulmonary embolism  Patient description:  Travis Rollins is a 48 y/o man with a past medical history of 2 prior PEs now admitted for new PE, DM2, LE edema and venous stasus and hyperlipidemia who had an episode of chest pain and syncope similar to prior PE.  Pt ran out of warfarin for 5 days.  He has had two previous PEs, one in 2013 about 6 months after stopping his warfarin and one about 8 years ago.   Tests/events  3/20  Chest CTA >>> 1. Bilateral proximal occlusive pulmonary emboli extending into the left and right upper and lower lobes. Overall clot burden is severe. 2. Abnormal right ventricular to left ventricular ratio consistent with right heart strain. 3/21  ECHO >>> LVEF 50-55%. RV moderately dilated with severe reduction in systolic function. PA peak pressure 71torr 3/23  LUE doppler : DVT in L jugular and axillary veins.   Lines/tubes  Culture data  Antibiotics:  Current Status/subjective:  Voices no complaitn  Vital Signs: Temp:  [97.1 F (36.2 C)-98 F (36.7 C)] 97.6 F (36.4 C) (03/23 0607) Pulse Rate:  [86-100] 93 (03/23 0607) Resp:  [11-32] 18 (03/23 0607) BP: (124-144)/(75-99) 144/93 mmHg (03/23 0607) SpO2:  [96 %-99 %] 97 % (03/23 0607) Weight:  [131.5 kg (289 lb 14.5 oz)-131.8 kg (290 lb 9.1 oz)] 131.5 kg (289 lb 14.5 oz) (03/23 6283)  Physical Examination: General:  Obese man no acute distress.  Neuro:  Alert and oriented x3 HEENT:  PERRL, EOMI, OP clear Neck:  Supple, no masses Cardiovascular:   Tachycardic, regular rhythm, no mrg Lungs:  CTAB, no wrr Abdomen:  Obese, soft, NTND  Musculoskeletal:  Venous stasis color changes on lower extremities, + edema left LE, no clubbing, no joint abnormalities. LUE painful to palp.  Skin:  No rashes or other lesions.   LABS: CBC  Recent Labs Lab 02/25/14 0325  02/26/14 0325 02/27/14 0747  WBC 4.2 6.1 4.7  HGB 15.7 13.9 13.7  HCT 45.2 41.1 40.0  PLT 74* 78* 83*   Coag's  Recent Labs Lab 02/24/14 2202 02/26/14 0325 02/27/14 0747  INR 1.87* 2.64* 2.37*   BMET  Recent Labs Lab 02/25/14 0805 02/26/14 0325 02/27/14 0747  NA 136* 140 138  K 6.1* 4.0 4.1  CL 98 101 100  CO2 20 24 23   BUN 20 16 13   CREATININE 0.88 0.73 0.77  GLUCOSE 346* 146* 241*   Electrolytes  Recent Labs Lab 02/25/14 0805 02/26/14 0325 02/27/14 0747  CALCIUM 9.4 9.0 8.9   Sepsis Markers No results found for this basename: LATICACIDVEN, PROCALCITON, O2SATVEN,  in the last 168 hours ABG  Recent Labs Lab 02/25/14 0047  PHART 7.355  PCO2ART 40.6  PO2ART 332.0*   Liver Enzymes  Recent Labs Lab 02/27/14 0747  AST 45*  ALT 120*  ALKPHOS 72  BILITOT 0.5  ALBUMIN 3.2*   Cardiac Enzymes  Recent Labs Lab 02/25/14 0108  PROBNP 578.6*   Glucose  Recent Labs Lab 02/26/14 0748 02/26/14 1205 02/26/14 1735 02/26/14 2130 02/27/14 0747 02/27/14 1154  GLUCAP 171* 132* 255* 167* 221* 315*   IMAGING: Dg Chest Port 1 View  02/26/2014   CLINICAL DATA:  Infiltrates, hypertension, diabetes  EXAM: PORTABLE CHEST - 1 VIEW  COMPARISON:  Portable exam 0622 hr compared to 02/24/2014  FINDINGS: Enlargement of cardiac silhouette.  Mediastinal contours  and pulmonary vascularity normal.  Lungs clear.  No pleural effusion or pneumothorax.  IMPRESSION: Enlargement of cardiac silhouette.  No acute abnormalities.   Electronically Signed   By: Lavonia Dana M.D.   On: 02/26/2014 10:34   ASSESSMENT AND PLAN  Acute respiratory failure  Wean FIO2 as tolerated, goal SpO2 > 92% Acute pulmonary embolism ( recurrent) LUE DVT  Continue Coumadin, needs lifelong  D/c Heparin Pulmonary hypertension Thrombocytopenia  Trend CBC DM2 with hyperglycemia  Restart Metformin  SSI  Lantus 30  Georgann Housekeeper, ACNP [facility] Pulmonology/Critical Care Pager [phone] or  [phone]  02/27/2014, 10:06 AM  I have personally obtained history, examined patient, evaluated and interpreted laboratory and imaging results, reviewed medical records, formulated assessment / plan and placed orders.  Doree Fudge, MD Pulmonary and [place] Pager: [phone]  02/27/2014, 3:18 PM

## 2014-02-27 NOTE — Progress Notes (Signed)
Inpatient Diabetes Program Recommendations  AACE/ADA: New Consensus Statement on Inpatient Glycemic Control (2013)  Target Ranges:  Prepandial:   less than 140 mg/dL      Peak postprandial:   less than 180 mg/dL (1-2 hours)      Critically ill patients:  140 - 180 mg/dL   Reason for Visit: Hyperglycemia  Results for Travis Rollins, Travis Rollins (MRN [id number]) as of 02/27/2014 17:19  Ref. Range 02/26/2014 17:35 02/26/2014 21:30 02/27/2014 07:47 02/27/2014 11:54 02/27/2014 16:55  Glucose-Capillary Latest Range: 70-99 mg/dL 255 (H) 167 (H) 221 (H) 315 (H) 311 (H)    Inpatient Diabetes Program Recommendations Insulin - Basal: Increase Lantus to 45 units QHS Correction (SSI): Add HS correction Insulin - Meal Coverage: Add Novolog 10 units tidwc for meal coverage insulin if pt eats >50% meal Outpatient Referral: OP Diabetes Education for uncontrolled DM  Note: Will f/u in am. Thank you. Lorenda Peck, RD, LDN, CDE Inpatient Diabetes Coordinator [phone]

## 2014-02-27 NOTE — Progress Notes (Signed)
ANTICOAGULATION CONSULT NOTE - Follow Up Consult  Pharmacy Consult for Heparin Indication: pulmonary embolus  Allergies  Allergen Reactions  . Penicillins Hives    Patient Measurements: Height: 5\' 6"  (167.6 cm) Weight: 289 lb 14.5 oz (131.5 kg) IBW/kg (Calculated) : 63.8  Vital Signs: Temp: 97.6 F (36.4 C) (03/23 0607) Temp src: Oral (03/23 0607) BP: 144/93 mmHg (03/23 0607) Pulse Rate: 93 (03/23 0607)  Labs:  Recent Labs  02/24/14 2202 02/25/14 0325  02/25/14 0805  02/26/14 0325 02/26/14 1300 02/27/14 0747  HGB 15.5 15.7  --   --   --  13.9  --  13.7  HCT 44.8 45.2  --   --   --  41.1  --  40.0  PLT 78* 74*  --   --   --  78*  --  83*  LABPROT 21.0*  --   --   --   --  27.3*  --  25.1*  INR 1.87*  --   --   --   --  2.64*  --  2.37*  HEPARINUNFRC  --   --   --  0.80*  < > 0.51 0.45 0.50  CREATININE 0.97  --   < > 0.88  --  0.73  --  0.77  < > = values in this interval not displayed.  Estimated Creatinine Clearance: 148.3 ml/min (by C-G formula based on Cr of 0.77).   Medications:  Heparin 1400 units/hr  Assessment: 47yom on heparin bridging to Coumadin for (+) PE, probable LLE DVT and LUE DVT per dopplers. INR (2.37) remains therapeutic on Coumadin 5mg  daily dosed per MD. Heparin level (0.5) remains therapeutic. CCM discontinued heparin drip (Day 3 of overlap) now that INR has been therapeutic x 2 (>24hrs). 5 day overlap will not be completed as patient had started Coumadin at least 5 days PTA and INR was near therapeutic range at admission.  - H/H stable, Plts low but stable - No significant bleeding reported  Goal of Therapy:  INR 2-3 Heparin level 0.3-0.7 units/ml Monitor platelets by anticoagulation protocol: Yes   Plan:  1. Heparin drip has been discontinued by CCM 2. Continue Coumadin 5mg  PO daily per MD 3. Daily INR 4. CCM dosing Coumadin - please place consult if pharmacy Coumadin management is desired   Earleen Newport [phone] 02/27/2014,10:35 AM

## 2014-02-28 LAB — CBC
HCT: 33.1 % — ABNORMAL LOW (ref 39.0–52.0)
Hemoglobin: 11.2 g/dL — ABNORMAL LOW (ref 13.0–17.0)
MCH: 31.3 pg (ref 26.0–34.0)
MCHC: 33.8 g/dL (ref 30.0–36.0)
MCV: 92.5 fL (ref 78.0–100.0)
Platelets: 77 10*3/uL — ABNORMAL LOW (ref 150–400)
RBC: 3.58 MIL/uL — ABNORMAL LOW (ref 4.22–5.81)
RDW: 14.1 % (ref 11.5–15.5)
WBC: 5.3 10*3/uL (ref 4.0–10.5)

## 2014-02-28 LAB — PROTIME-INR
INR: 2.36 — ABNORMAL HIGH (ref 0.00–1.49)
Prothrombin Time: 25 seconds — ABNORMAL HIGH (ref 11.6–15.2)

## 2014-02-28 LAB — GLUCOSE, CAPILLARY
GLUCOSE-CAPILLARY: 264 mg/dL — AB (ref 70–99)
Glucose-Capillary: 225 mg/dL — ABNORMAL HIGH (ref 70–99)
Glucose-Capillary: 262 mg/dL — ABNORMAL HIGH (ref 70–99)

## 2014-02-28 MED ORDER — ACETAMINOPHEN 325 MG PO TABS: 650.0000 mg | ORAL_TABLET | Freq: Four times a day (QID) | ORAL | Status: DC | PRN

## 2014-02-28 MED ORDER — ACETAMINOPHEN 500 MG PO TABS: 1000.0000 mg | ORAL_TABLET | Freq: Four times a day (QID) | ORAL | Status: DC | PRN

## 2014-02-28 NOTE — Discharge Summary (Signed)
Physician Discharge Summary  Patient ID: TABIAS SWAYZE MRN: [id number] DOB/AGE: [birth year] 48 y.o.  Admit date: 02/24/2014 Discharge date: 02/28/2014  Discharge Diagnoses:  Acute respiratory failure Recurrent pulmonary embolism Pulmonary hypertension LUE DVT Thrombocytopenia DM II                                                                     DISCHARGE PLAN BY DIAGNOSIS     Acute respiratory failure, resolved.  Recurrent pulmonary embolism in setting of medical noncompliance and possible hypercoagulability secondary to restarting Coumadin without Heparin bridging. This should NOT be considered Coumadin failure.  Pulmonary hypertension  LUE DVT  Discharge Plan: - Continue Coumadin -- needs lifelong therapy. - Stress importance of making sure that Coumadin is taken daily. - Follow up with Coumadin Clinic as scheduled below. - Follow up with PCP, please call and make an appointment within 7 - 10 days.  Thrombocytopenia - chronic/at baseline. Discharge Plan: - Follow up with PCP.  DM II Discharge Plan: - Continue home meds. - Follow up with PCP.                DISCHARGE SUMMARY   Travis Rollins is a 48 y.o. y/o male with a PMH of 2 prior PE's, DM2, LE edema/venous stasis, HLD, who was brought in by EMS on 3/21 after he had an episode of chest pain and syncope that was very similar to his prior PE.  Pt had been on Warfarin since prior PE, although reports that he ran out of his medication in early February.  He saw a PCP and was able to get a new prescription that he started taking again just 4 - 5 days prior to admission. In the ED, CTA obtained revealed new bilateral proximal occlusive PE's extending into the left and right upper and lower lobes with significant clot burden causing right heart strain. He was admitted to the ICU and started on a heparin drip.  Warfarin was continued with daily INR monitoring and heparin dc'd 3/23 after INR deemed therapeutic.   Glucose was elevated at 389 (note, pt stated that he had also ran out of his lantus prior to admission).  He was put on a resistant scale glucose stabilizer and started on Lantus 300 qhs. An echo was performed, showed EF of 50 - 55% with moderate RV dilation and severe reduction in systolic pressures. LUE venous duplex was performed and revealed DVT in the jugular and axillary veins.  SIGNIFICANT DIAGNOSTIC STUDIES 3/20 Chest CTA >>> 1. Bilateral proximal occlusive pulmonary emboli extending into the left and right upper and lower lobes. Overall clot burden is severe. 2. Abnormal right ventricular to left ventricular ratio consistent with right heart strain.  3/21 ECHO >>> LVEF 50-55%. RV moderately dilated with severe reduction in systolic function. PA peak pressure 71torr  3/23 LUE doppler : DVT in L jugular and axillary veins.   MICRO DATA  None  ANTIBIOTICS None  CONSULTS None  TUBES / LINES PIV  Discharge Exam: General: Obese man, sitting on side of bed eating breakfast, in NAD. Neuro: A&O x 3. HEENT: PERRL, EOMI, OP clear  Cardiovascular: RRR, no M/R/G. Lungs: CTAB, no W/R/R. Abdomen: Obese, soft, NTND  Musculoskeletal: Venous stasis color changes on lower  extremities, + edema LLE, LUE tender to palpation Skin: No rashes or other lesions.   Filed Vitals:   02/27/14 0607 02/27/14 1336 02/27/14 2156 02/28/14 0528  BP: 144/93 123/85 155/87 138/85  Pulse: 93 107 108 63  Temp: 97.6 F (36.4 C) 97.3 F (36.3 C) 98 F (36.7 C) 98 F (36.7 C)  TempSrc: Oral Oral Oral Oral  Resp: 18 18 18 18   Height:      Weight: 289 lb 14.5 oz (131.5 kg)   291 lb 3.6 oz (132.1 kg)  SpO2: 97% 99% 97% 96%   Discharge Labs  BMET  Recent Labs Lab 02/24/14 2202 02/25/14 0505 02/25/14 0805 02/26/14 0325 02/27/14 0747  NA 137 136* 136* 140 138  K 4.4 5.7* 6.1* 4.0 4.1  CL 95* 99 98 101 100  CO2 25 22 20 24 23   GLUCOSE 374* 409* 346* 146* 241*  BUN 18 19 20 16 13   CREATININE  0.97 0.91 0.88 0.73 0.77  CALCIUM 9.5 9.6 9.4 9.0 8.9   CBC  Recent Labs Lab 02/26/14 0325 02/27/14 0747 02/28/14 0505  HGB 13.9 13.7 11.2*  HCT 41.1 40.0 33.1*  WBC 6.1 4.7 5.3  PLT 78* 83* 77*   Anti-Coagulation  Recent Labs Lab 02/24/14 2202 02/26/14 0325 02/27/14 0747 02/28/14 0505  INR 1.87* 2.64* 2.37* 2.36*   Discharge Orders   Future Appointments Provider Department Dept Phone   03/07/2014 10:30 AM [facility] [place] Office [phone]   Future Orders Complete By Expires   Call MD for:  difficulty breathing, headache or visual disturbances  As directed    Call MD for:  extreme fatigue  As directed    Call MD for:  persistant dizziness or light-headedness  As directed    Diet - low sodium heart healthy  As directed    Increase activity slowly  As directed          Follow-up Information   Call Vonna Drafts., FNP. (Please call and set up a follow up appointment in 1 week.)    Specialty:  Nurse Practitioner   Contact information:   [address] [place] [address] [phone]       Follow up with [facility] On 03/07/2014. (At 10:30AM)    Specialty:  Cardiology   Contact information:   [address], [address] [place] [address] [phone]        Medication List         furosemide 40 MG tablet  Commonly known as:  LASIX  Take 40 mg by mouth every morning.     LANTUS SOLOSTAR 100 UNIT/ML Solostar Pen  Generic drug:  Insulin Glargine  Inject 100 Units into the skin daily at 10 pm.     lovastatin 20 MG tablet  Commonly known as:  MEVACOR  Take 20 mg by mouth at bedtime.     metFORMIN 500 MG tablet  Commonly known as:  GLUCOPHAGE  Take 500 mg by mouth 2 (two) times daily with a meal.     niacin 500 MG CR capsule  Take 500 mg by mouth daily.     NOVOLOG FLEXPEN 100 UNIT/ML FlexPen  Generic drug:  insulin aspart  Inject 10 Units into the skin 3  (three) times daily with meals.     Vitamin D3 5000 UNITS Caps  Take 5,000 Units by mouth every evening.     warfarin 5 MG tablet  Commonly known as:  COUMADIN  - Take 5-7.5 mg by mouth See admin instructions. Takes 7.77m daily on Mon, Wed, Fri  - Takes 534mon Tues and Thurs  - DOES NOT TAKE ON SAT OR SUN       Disposition: Home  Discharged Condition: Travis BANGURAas met maximum benefit of inpatient care and is medically stable and cleared for discharge.  Patient is pending follow up as above.     Time spent on disposition:  Greater than 35 minutes.   RaMontey HoraPA - [facility] [place] [facility] Pgr: [phone]  or [phone] FudgeMD Pulmonary and CrFergusonager: [phone]

## 2014-02-28 NOTE — Progress Notes (Deleted)
[facility] PCCM General Progress Note  S:  Doing well, denies chest pain, SOB.  Has mild pain in LUE, has taken some Vicodin for it.  This is his only concern regarding being discharged.  He is willing to try Tylenol today, thinks that may be enough as pain is not that severe.  If Tylenol not enough, will d/c with limited # of Vicodin.  O:  Vitals stable. Gen: eating breakfast, in NAD. CV:  RRR, no M/R/G. Lungs:  Clear. GI:  BS x 4, soft, NT/ND. Ext:  Tender LUE, discolored.  Mild edema LLE.  A/P: Recurrent PE LUE DVT - Try Tylenol for pain, plan for d/c later today.   Montey Hora, [place] [facility] & Critical [facility] Pgr: [phone]  or [phone]

## 2014-02-28 NOTE — Care Management Note (Signed)
    Page 1 of 1   02/28/2014     3:30:26 PM   CARE MANAGEMENT NOTE 02/28/2014  Patient:  Travis Rollins, Travis Rollins   Account Number:  [id number]  Date Initiated:  02/28/2014  Documentation initiated by:  Tomi Bamberger  Subjective/Objective Assessment:   dx acute resp failure  admit-lives with family.     Action/Plan:   Anticipated DC Date:  02/28/2014   Anticipated DC Plan:  HOME/SELF CARE      DC Planning Services  CM consult      Choice offered to / List presented to:             Status of service:  Completed, signed off Medicare Important Message given?   (If response is "NO", the following Medicare IM given date fields will be blank) Date Medicare IM given:   Date Additional Medicare IM given:    Discharge Disposition:  HOME/SELF CARE  Per UR Regulation:  Reviewed for med. necessity/level of care/duration of stay  If discussed at [place] of Stay Meetings, dates discussed:    Comments:

## 2014-03-07 ENCOUNTER — Encounter: Payer: Medicare HMO | Admitting: *Deleted

## 2014-03-07 ENCOUNTER — Telehealth: Payer: Self-pay | Admitting: *Deleted

## 2014-03-07 NOTE — Telephone Encounter (Signed)
Pt scheduled for post hospital coumadin  follow up today. Pt is not established with MD in this practice. Pt has been followed by Selina Cooley FNP previously. Called her office and spoke with her and she states they will continue to manage and monitor pt's coumadin. Advised to send pt to their  office for PT/INR lab draw today , but pt has no transportation dependent on [facility]. Called [facility] they cannot transport pt today to their office. Called [facility] office requested written order for pt/inr to be faxed over. Will send pt to [facility] downstairs for pt/inr draw with results to be faxed to [facility].s office

## 2014-05-10 ENCOUNTER — Encounter (HOSPITAL_COMMUNITY): Payer: Self-pay | Admitting: Emergency Medicine

## 2014-05-10 ENCOUNTER — Emergency Department (HOSPITAL_COMMUNITY): Payer: Medicare HMO

## 2014-05-10 ENCOUNTER — Emergency Department (HOSPITAL_COMMUNITY)
Admission: EM | Admit: 2014-05-10 | Discharge: 2014-05-10 | Disposition: A | Discharge status: Home / Self Care | Payer: Medicare HMO | Attending: Emergency Medicine | Admitting: Emergency Medicine

## 2014-05-10 DIAGNOSIS — E119 Type 2 diabetes mellitus without complications: Secondary | ICD-10-CM | POA: Insufficient documentation

## 2014-05-10 DIAGNOSIS — Z862 Personal history of diseases of the blood and blood-forming organs and certain disorders involving the immune mechanism: Secondary | ICD-10-CM | POA: Insufficient documentation

## 2014-05-10 DIAGNOSIS — Z86718 Personal history of other venous thrombosis and embolism: Secondary | ICD-10-CM | POA: Insufficient documentation

## 2014-05-10 DIAGNOSIS — Z794 Long term (current) use of insulin: Secondary | ICD-10-CM | POA: Insufficient documentation

## 2014-05-10 DIAGNOSIS — I10 Essential (primary) hypertension: Secondary | ICD-10-CM | POA: Insufficient documentation

## 2014-05-10 DIAGNOSIS — Z79899 Other long term (current) drug therapy: Secondary | ICD-10-CM | POA: Insufficient documentation

## 2014-05-10 DIAGNOSIS — E669 Obesity, unspecified: Secondary | ICD-10-CM | POA: Insufficient documentation

## 2014-05-10 DIAGNOSIS — Z880 Allergy status to penicillin: Secondary | ICD-10-CM | POA: Insufficient documentation

## 2014-05-10 DIAGNOSIS — Z7901 Long term (current) use of anticoagulants: Secondary | ICD-10-CM | POA: Insufficient documentation

## 2014-05-10 DIAGNOSIS — N200 Calculus of kidney: Secondary | ICD-10-CM | POA: Insufficient documentation

## 2014-05-10 LAB — CBC WITH DIFFERENTIAL/PLATELET
Basophils Absolute: 0 10*3/uL (ref 0.0–0.1)
Basophils Relative: 0 % (ref 0–1)
Eosinophils Absolute: 0 10*3/uL (ref 0.0–0.7)
Eosinophils Relative: 1 % (ref 0–5)
HCT: 40.8 % (ref 39.0–52.0)
Hemoglobin: 13.7 g/dL (ref 13.0–17.0)
LYMPHS ABS: 2 10*3/uL (ref 0.7–4.0)
LYMPHS PCT: 40 % (ref 12–46)
MCH: 32.1 pg (ref 26.0–34.0)
MCHC: 33.6 g/dL (ref 30.0–36.0)
MCV: 95.6 fL (ref 78.0–100.0)
Monocytes Absolute: 0.6 10*3/uL (ref 0.1–1.0)
Monocytes Relative: 11 % (ref 3–12)
Neutro Abs: 2.4 10*3/uL (ref 1.7–7.7)
Neutrophils Relative %: 48 % (ref 43–77)
PLATELETS: 125 10*3/uL — AB (ref 150–400)
RBC: 4.27 MIL/uL (ref 4.22–5.81)
RDW: 13.8 % (ref 11.5–15.5)
WBC: 5 10*3/uL (ref 4.0–10.5)

## 2014-05-10 LAB — COMPREHENSIVE METABOLIC PANEL
ALK PHOS: 56 U/L (ref 39–117)
ALT: 16 U/L (ref 0–53)
AST: 21 U/L (ref 0–37)
Albumin: 3.6 g/dL (ref 3.5–5.2)
BUN: 8 mg/dL (ref 6–23)
CO2: 26 mEq/L (ref 19–32)
Calcium: 9.1 mg/dL (ref 8.4–10.5)
Chloride: 104 mEq/L (ref 96–112)
Creatinine, Ser: 0.7 mg/dL (ref 0.50–1.35)
GFR calc Af Amer: >90 mL/min (ref >90)
Glucose, Bld: 113 mg/dL — ABNORMAL HIGH (ref 70–99)
POTASSIUM: 3.4 meq/L — AB (ref 3.7–5.3)
SODIUM: 142 meq/L (ref 137–147)
Total Bilirubin: 0.3 mg/dL (ref 0.3–1.2)
Total Protein: 7.9 g/dL (ref 6.0–8.3)

## 2014-05-10 LAB — URINALYSIS, ROUTINE W REFLEX MICROSCOPIC
BILIRUBIN URINE: NEGATIVE
GLUCOSE, UA: NEGATIVE mg/dL
Ketones, ur: NEGATIVE mg/dL
Leukocytes, UA: NEGATIVE
NITRITE: NEGATIVE
PH: 5.5 (ref 5.0–8.0)
Protein, ur: 30 mg/dL — AB
Specific Gravity, Urine: 1.01 (ref 1.005–1.030)
Urobilinogen, UA: 0.2 mg/dL (ref 0.0–1.0)

## 2014-05-10 LAB — URINE MICROSCOPIC-ADD ON

## 2014-05-10 LAB — PROTIME-INR
INR: 2.72 — ABNORMAL HIGH (ref 0.00–1.49)
Prothrombin Time: 27.9 seconds — ABNORMAL HIGH (ref 11.6–15.2)

## 2014-05-10 MED ORDER — OXYCODONE-ACETAMINOPHEN 5-325 MG PO TABS: 1.0000 | ORAL_TABLET | ORAL | Status: DC | PRN

## 2014-05-10 MED ORDER — HYDROCODONE-ACETAMINOPHEN 5-325 MG PO TABS
1.0000 | ORAL_TABLET | Freq: Once | ORAL | Status: AC
  Administered 2014-05-10: 1 via ORAL
  Filled 2014-05-10: qty 1

## 2014-05-10 MED ORDER — ONDANSETRON HCL 4 MG PO TABS: 4.0000 mg | ORAL_TABLET | Freq: Four times a day (QID) | ORAL | Status: DC

## 2014-05-10 NOTE — Discharge Instructions (Signed)
Kidney Stones Kidney stones (urolithiasis) are solid masses that form inside your kidneys. The intense pain is caused by the stone moving through the kidney, ureter, bladder, and urethra (urinary tract). When the stone moves, the ureter starts to spasm around the stone. The stone is usually passed in your pee (urine).  HOME CARE  Drink enough fluids to keep your pee clear or pale yellow. This helps to get the stone out.  Strain all pee through the provided strainer. Do not pee without peeing through the strainer, not even once. If you pee the stone out, catch it in the strainer. The stone may be as small as a grain of salt. Take this to your doctor. This will help your doctor figure out what you can do to try to prevent more kidney stones.  Only take medicine as told by your doctor.  Follow up with your doctor as told.  Get follow-up X-rays as told by your doctor. GET HELP IF: You have pain that gets worse even if you have been taking pain medicine. GET HELP RIGHT AWAY IF:   Your pain does not get better with medicine.  You have a fever or shaking chills.  Your pain increases and gets worse over 18 hours.  You have new belly (abdominal) pain.  You feel faint or pass out.  You are unable to pee. MAKE SURE YOU:   Understand these instructions.  Will watch your condition.  Will get help right away if you are not doing well or get worse. Document Released: 05/12/2008 Document Revised: 07/27/2013 Document Reviewed: 04/27/2013 [employer] Patient Information 2014 [employer].  

## 2014-05-10 NOTE — ED Notes (Signed)
Patient arrived to room E46.

## 2014-05-10 NOTE — ED Notes (Signed)
Pt arrived by [facility] for groin pain that radiates up into his lower abd, having burning pain with urination and blood in his urine.

## 2014-05-10 NOTE — ED Notes (Signed)
Patient states he has had hematuria since early this morning and had discomfort in his mid lower abdomen. He states he had pain in his right side of his abdomen that has gone away.

## 2014-05-10 NOTE — ED Notes (Signed)
Patient given urine strainer 

## 2014-05-10 NOTE — ED Provider Notes (Signed)
CSN: [id number]     Arrival date & time 05/10/14  1407 History   First MD Initiated Contact with Patient 05/10/14 1632     Chief Complaint  Patient presents with  . Groin Pain  . Abdominal Pain  . Hematuria     (Consider location/radiation/quality/duration/timing/severity/associated sxs/prior Treatment) Patient is a 48 y.o. male presenting with groin pain. The history is provided by the patient.  Groin Pain This is a new problem. Episode onset: 3 AM this morning. The problem occurs intermittently. The problem has been waxing and waning. Associated symptoms include abdominal pain, nausea and urinary symptoms. Pertinent negatives include no arthralgias, change in bowel habit, chest pain, chills, congestion, coughing, fever, joint swelling, myalgias, neck pain, numbness, rash, sore throat, swollen glands, visual change, vomiting or weakness. Associated symptoms comments: Dysuria and hematuria. Exacerbated by: urination. He has tried nothing for the symptoms.   Patient with h/o previous DVT and takes coumadin 5 days a week, reports sudden onset of right lateral abdominal pain and groin pain that woke him from sleep at 3 am.  He reports having urge to urinate and noticed blood in his urine and burning sensation.  Since onset, he reports hving frequency of urination and intermittent blood.  He also reports that he feels like he is not fully emptying his bladder.  He denies h/o kidney stones, fever, vomiting, pain or swelling to his testicles, chest pain or back pain.     Past Medical History  Diagnosis Date  . Hypertension   . Deep vein thrombosis   . Obesity   . Diabetes mellitus   . Peripheral vascular disease     6 yrs ago, DVT in Lt knee/ groin  . Shortness of breath   . Anemia    Past Surgical History  Procedure Laterality Date  . Tonsillectomy     History reviewed. No pertinent family history. History  Substance Use Topics  . Smoking status: Never Smoker   . Smokeless tobacco:  Never Used  . Alcohol Use: No    Review of Systems  Constitutional: Negative for fever, chills and appetite change.  HENT: Negative for congestion and sore throat.   Respiratory: Negative for cough and shortness of breath.   Cardiovascular: Negative for chest pain.  Gastrointestinal: Positive for nausea and abdominal pain. Negative for vomiting, diarrhea, blood in stool, abdominal distention and change in bowel habit.  Genitourinary: Positive for dysuria, frequency, hematuria and difficulty urinating. Negative for flank pain, decreased urine volume, discharge, penile swelling, scrotal swelling and testicular pain.  Musculoskeletal: Negative for arthralgias, back pain, joint swelling, myalgias and neck pain.  Skin: Negative for color change and rash.  Neurological: Negative for dizziness, weakness and numbness.  Hematological: Negative for adenopathy.  All other systems reviewed and are negative.     Allergies  Penicillins  Home Medications   Prior to Admission medications   Medication Sig Start Date End Date Taking? Authorizing Provider  Cholecalciferol (VITAMIN D3) 5000 UNITS CAPS Take 5,000 Units by mouth every evening.    Yes Historical Provider, MD  furosemide (LASIX) 40 MG tablet Take 40 mg by mouth every morning.    Yes Historical Provider, MD  insulin aspart (NOVOLOG FLEXPEN) 100 UNIT/ML FlexPen Inject 10 Units into the skin 3 (three) times daily with meals.   Yes Historical Provider, MD  Insulin Glargine (LANTUS SOLOSTAR) 100 UNIT/ML Solostar Pen Inject 100 Units into the skin daily at 10 pm.    Yes Historical Provider, MD  lovastatin (MEVACOR)  20 MG tablet Take 20 mg by mouth at bedtime.   Yes Historical Provider, MD  metFORMIN (GLUCOPHAGE) 500 MG tablet Take 500 mg by mouth 2 (two) times daily with a meal.   Yes Historical Provider, MD  niacin 500 MG CR capsule Take 500 mg by mouth daily.   Yes Historical Provider, MD  warfarin (COUMADIN) 5 MG tablet Take 5-7.5 mg by mouth  See admin instructions. Takes 7.5mg  daily on Mon, Wed, Fri Takes 5mg  on Tues, Thurs, Sat, Sunday DOES NOT TAKE ON SAT OR SUN   Yes Historical Provider, MD   BP 122/75  Pulse 70  Temp(Src) 97.8 F (36.6 C) (Oral)  Resp 18  Ht 5\' 8"  (1.727 m)  Wt 299 lb (135.626 kg)  BMI 45.47 kg/m2  SpO2 98% Physical Exam  Nursing note and vitals reviewed. Constitutional: He is oriented to person, place, and time. He appears well-developed and well-nourished. No distress.  HENT:  Head: Normocephalic and atraumatic.  Neck: Normal range of motion. Neck supple.  Cardiovascular: Normal rate, regular rhythm, normal heart sounds and intact distal pulses.   No murmur heard. Pulmonary/Chest: Effort normal and breath sounds normal. No respiratory distress. He exhibits no tenderness.  Abdominal: Soft. Normal appearance. He exhibits no distension and no mass. There is no tenderness. There is no rebound, no guarding and no CVA tenderness.  Pt points to RLQ and suprapubic area as source of tenderness w/o obvious tenderness on exam.  No CVA tenderness.   Musculoskeletal: Normal range of motion.  Lymphadenopathy:    He has no cervical adenopathy.  Neurological: He is alert and oriented to person, place, and time. He exhibits normal muscle tone. Coordination normal.  Skin: Skin is warm and dry.    ED Course  Procedures (including critical care time) Labs Review Labs Reviewed  URINALYSIS, ROUTINE W REFLEX MICROSCOPIC - Abnormal; Notable for the following:    Hgb urine dipstick LARGE (*)    Protein, ur 30 (*)    All other components within normal limits  CBC WITH DIFFERENTIAL - Abnormal; Notable for the following:    Platelets 125 (*)    All other components within normal limits  COMPREHENSIVE METABOLIC PANEL - Abnormal; Notable for the following:    Potassium 3.4 (*)    Glucose, Bld 113 (*)    All other components within normal limits  PROTIME-INR - Abnormal; Notable for the following:    Prothrombin  Time 27.9 (*)    INR 2.72 (*)    All other components within normal limits  URINE MICROSCOPIC-ADD ON    Imaging Review Ct Abdomen Pelvis Wo Contrast  05/10/2014   CLINICAL DATA:  Groin pain.  Abdominal pain.  Painful urination.  EXAM: CT ABDOMEN AND PELVIS WITHOUT CONTRAST  TECHNIQUE: Multidetector CT imaging of the abdomen and pelvis was performed following the standard protocol without IV contrast.  COMPARISON:  None.  FINDINGS: The lung bases are clear. The heart size is normal. Calcifications are present at the aortic valve. No significant pleural or pericardial effusion is present.  Marked fatty infiltration of the liver is present. There is no evidence for cirrhosis or discrete lesion. Spleen is unremarkable. The stomach, duodenum, and pancreas are normal. The common bile duct and gallbladder are within normal limits.  The adrenal glands are normal bilaterally. An 8 mm nonobstructing stone is present near the lower pole of the right kidney. An adjacent 5 mm nonobstructing stone is separate. There is some stranding about the right kidney and  mild dilation of the right ureter. An obstructing a 4 mm stone is present at the right UVJ.  The left kidney and ureter are within normal limits. Atherosclerotic calcifications are present in the aorta without aneurysm. No significant adenopathy or free fluid is present.  The rectosigmoid colon is within normal limits. The remainder of the colon is unremarkable. The appendix is visualized and normal. The small bowel is unremarkable. A prominent paraumbilical hernia contains fat but no bowel.  Bone windows demonstrate mild endplate changes. There is fusion across the posterior facet joints at L4-5 and L5-S1. A vacuum disc is present at T10-11. A calcified disc protrusion results in moderate right central and foraminal stenosis.  IMPRESSION: 1. Obstructing 4 mm stone at the right UVJ with mild dilation of the right ureter and stranding. 2. Additional nonobstructing  stones at the lower pole of the right kidney measure 8 mm and 5 mm respectively. 3. In diffuse fatty infiltration of the liver. 4. Aortic valve calcifications. 5. Prominent fat containing paraumbilical hernia without associated bowel. 6. Moderate right central and foraminal stenosis at T10-11 secondary to a calcified disc protrusion.   Electronically Signed   By: Lawrence Santiago M.D.   On: 05/10/2014 18:17     EKG Interpretation None      MDM   Final diagnoses:  Kidney stone on right side   Pt is well appearing, non toxic.  VSS.  Declines pain medication at present.  Sx's likely related to kidney stone.  Will order labs and CT scan of abd/pelvis   Discussed CT scan results with pt.  He now requests pain medication. It is felt that pt may pass the stone. Will dispense urine strainers, percocet and zofran and given referral info for [facility].  Pt agrees to the plan and appears stable for d/c.  Also advised to return here if needed.        Crystol Walpole L. Vanessa Industry, PA-C 05/11/14 2121

## 2014-05-10 NOTE — ED Notes (Signed)
Patient urinated and no stones noted with strainer.

## 2014-05-14 NOTE — ED Provider Notes (Signed)
Medical screening examination/treatment/procedure(s) were performed by non-physician practitioner and as supervising physician I was immediately available for consultation/collaboration.   EKG Interpretation None        Fredia Sorrow, MD 05/14/14 1142

## 2014-07-08 HISTORY — PX: OTHER SURGICAL HISTORY: SHX169

## 2014-07-21 ENCOUNTER — Other Ambulatory Visit (HOSPITAL_COMMUNITY): Payer: Self-pay | Admitting: Specialist

## 2014-07-21 ENCOUNTER — Other Ambulatory Visit: Payer: Self-pay | Admitting: Radiology

## 2014-07-21 DIAGNOSIS — I82402 Acute embolism and thrombosis of unspecified deep veins of left lower extremity: Secondary | ICD-10-CM

## 2014-07-24 ENCOUNTER — Ambulatory Visit (HOSPITAL_COMMUNITY)
Admission: RE | Admit: 2014-07-24 | Discharge: 2014-07-24 | Disposition: A | Discharge status: Home / Self Care | Payer: Medicare Other | Source: Ambulatory Visit | Attending: Specialist | Admitting: Specialist

## 2014-07-24 ENCOUNTER — Encounter (HOSPITAL_COMMUNITY): Payer: Self-pay

## 2014-07-24 ENCOUNTER — Encounter (HOSPITAL_COMMUNITY): Payer: Self-pay | Admitting: Pharmacy Technician

## 2014-07-24 DIAGNOSIS — Z86711 Personal history of pulmonary embolism: Secondary | ICD-10-CM | POA: Diagnosis not present

## 2014-07-24 DIAGNOSIS — E669 Obesity, unspecified: Secondary | ICD-10-CM | POA: Diagnosis not present

## 2014-07-24 DIAGNOSIS — Z7901 Long term (current) use of anticoagulants: Secondary | ICD-10-CM | POA: Insufficient documentation

## 2014-07-24 DIAGNOSIS — E119 Type 2 diabetes mellitus without complications: Secondary | ICD-10-CM | POA: Diagnosis not present

## 2014-07-24 DIAGNOSIS — I10 Essential (primary) hypertension: Secondary | ICD-10-CM | POA: Diagnosis not present

## 2014-07-24 DIAGNOSIS — Z86718 Personal history of other venous thrombosis and embolism: Secondary | ICD-10-CM | POA: Diagnosis not present

## 2014-07-24 DIAGNOSIS — I82409 Acute embolism and thrombosis of unspecified deep veins of unspecified lower extremity: Secondary | ICD-10-CM | POA: Diagnosis not present

## 2014-07-24 DIAGNOSIS — I82402 Acute embolism and thrombosis of unspecified deep veins of left lower extremity: Secondary | ICD-10-CM

## 2014-07-24 LAB — CBC
HEMATOCRIT: 40 % (ref 39.0–52.0)
Hemoglobin: 13.2 g/dL (ref 13.0–17.0)
MCH: 31.3 pg (ref 26.0–34.0)
MCHC: 33 g/dL (ref 30.0–36.0)
MCV: 94.8 fL (ref 78.0–100.0)
Platelets: 104 10*3/uL — ABNORMAL LOW (ref 150–400)
RBC: 4.22 MIL/uL (ref 4.22–5.81)
RDW: 14.7 % (ref 11.5–15.5)
WBC: 4.4 10*3/uL (ref 4.0–10.5)

## 2014-07-24 LAB — BASIC METABOLIC PANEL
ANION GAP: 12 (ref 5–15)
BUN: 12 mg/dL (ref 6–23)
CALCIUM: 9.7 mg/dL (ref 8.4–10.5)
CO2: 30 meq/L (ref 19–32)
Chloride: 102 mEq/L (ref 96–112)
Creatinine, Ser: 0.76 mg/dL (ref 0.50–1.35)
GFR calc Af Amer: >90 mL/min (ref >90)
GFR calc non Af Amer: >90 mL/min (ref >90)
Glucose, Bld: 170 mg/dL — ABNORMAL HIGH (ref 70–99)
POTASSIUM: 3.9 meq/L (ref 3.7–5.3)
SODIUM: 144 meq/L (ref 137–147)

## 2014-07-24 LAB — GLUCOSE, CAPILLARY: GLUCOSE-CAPILLARY: 166 mg/dL — AB (ref 70–99)

## 2014-07-24 LAB — PROTIME-INR
INR: 2.76 — AB (ref 0.00–1.49)
Prothrombin Time: 29.2 seconds — ABNORMAL HIGH (ref 11.6–15.2)

## 2014-07-24 LAB — APTT: aPTT: 35 seconds (ref 24–37)

## 2014-07-24 MED ORDER — MIDAZOLAM HCL 2 MG/2ML IJ SOLN
INTRAMUSCULAR | Status: AC
  Filled 2014-07-24: qty 2

## 2014-07-24 MED ORDER — SODIUM CHLORIDE 0.9 % IV SOLN: Freq: Once | INTRAVENOUS | Status: DC

## 2014-07-24 MED ORDER — MIDAZOLAM HCL 2 MG/2ML IJ SOLN
INTRAMUSCULAR | Status: AC | PRN
  Administered 2014-07-24: 1 mg via INTRAVENOUS

## 2014-07-24 MED ORDER — IOHEXOL 300 MG/ML  SOLN
100.0000 mL | Freq: Once | INTRAMUSCULAR | Status: AC | PRN
  Administered 2014-07-24: 50 mL via INTRAVENOUS

## 2014-07-24 MED ORDER — FENTANYL CITRATE 0.05 MG/ML IJ SOLN
INTRAMUSCULAR | Status: AC | PRN
  Administered 2014-07-24: 50 ug via INTRAVENOUS

## 2014-07-24 MED ORDER — LIDOCAINE HCL 1 % IJ SOLN
INTRAMUSCULAR | Status: AC
  Filled 2014-07-24: qty 20

## 2014-07-24 MED ORDER — FENTANYL CITRATE 0.05 MG/ML IJ SOLN
INTRAMUSCULAR | Status: AC
  Filled 2014-07-24: qty 2

## 2014-07-24 NOTE — H&P (Signed)
Agree.  For IVC filter placement today. 

## 2014-07-24 NOTE — Procedures (Signed)
Procedure:  IVC filter placement Access:  Right IJ vein Findings:  Widely patent IVC.  Bard Lynndyl IVC filter placed in infrarenal IVC. No complications.

## 2014-07-24 NOTE — H&P (Signed)
Travis Rollins is an 48 y.o. male.   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 No known coagulation abnormality to his knowledge Has come off coumadin more than once secondary "financial reasons" Has had new known B Pulmonary embolus 02/25/14 per [facility] Admitted to [facility] at that time and treated with Heparin IV Admits to noncompliance just 1 month previous to then and had restarted coumadin just 5 days prior to that admission Has been on coumadin since then without any time off Noted new Left leg pain 8/14 Was seen and examined by Dr Pavelock--doppler studies at [facility] shows new L lower extr DVT even while on coumadin  Now scheduled for Inferior Vena Cava filter placement Pt is on life long anticoagulation  HPI: HTN; Hx B PE and Lowe extr DVT; DM; HLD; obese; PVD  Past Medical History  Diagnosis Date  . Hypertension   . Deep vein thrombosis   . Obesity   . Diabetes mellitus   . Peripheral vascular disease     6 yrs ago, DVT in Lt knee/ groin  . Shortness of breath   . Anemia     Past Surgical History  Procedure Laterality Date  . Tonsillectomy      No family history on file. Social History:  reports that he has never smoked. He has never used smokeless tobacco. He reports that he does not drink alcohol or use illicit drugs.  Allergies:  Allergies  Allergen Reactions  . Penicillins Hives     (Not in a hospital admission)  Results for orders placed during the hospital encounter of 07/24/14 (from the past 48 hour(s))  GLUCOSE, CAPILLARY     Status: Abnormal   Collection Time    07/24/14 11:48 AM      Result Value Ref Range   Glucose-Capillary 166 (*) 70 - 99 mg/dL  APTT     Status: None   Collection Time    07/24/14 12:00 PM      Result Value Ref Range   aPTT 35  24 - 37 seconds  BASIC METABOLIC PANEL     Status: Abnormal   Collection Time    07/24/14 12:00 PM      Result Value Ref Range   Sodium 144  137  - 147 mEq/L   Potassium 3.9  3.7 - 5.3 mEq/L   Chloride 102  96 - 112 mEq/L   CO2 30  19 - 32 mEq/L   Glucose, Bld 170 (*) 70 - 99 mg/dL   BUN 12  6 - 23 mg/dL   Creatinine, Ser 0.76  0.50 - 1.35 mg/dL   Calcium 9.7  8.4 - 10.5 mg/dL   GFR calc non Af Amer >90  >90 mL/min   GFR calc Af Amer >90  >90 mL/min   Comment: (NOTE)     The eGFR has been calculated using the CKD EPI equation.     This calculation has not been validated in all clinical situations.     eGFR's persistently <90 mL/min signify possible Chronic Kidney     Disease.   Anion gap 12  5 - 15  CBC     Status: None   Collection Time    07/24/14 12:00 PM      Result Value Ref Range   WBC 4.4  4.0 - 10.5 K/uL   RBC 4.22  4.22 - 5.81 MIL/uL   Hemoglobin 13.2  13.0 - 17.0  g/dL   HCT 40.0  39.0 - 52.0 %   MCV 94.8  78.0 - 100.0 fL   MCH 31.3  26.0 - 34.0 pg   MCHC 33.0  30.0 - 36.0 g/dL   RDW 14.7  11.5 - 15.5 %   Platelets PENDING  150 - 400 K/uL  PROTIME-INR     Status: Abnormal   Collection Time    07/24/14 12:00 PM      Result Value Ref Range   Prothrombin Time 29.2 (*) 11.6 - 15.2 seconds   INR 2.76 (*) 0.00 - 1.49   No results found.  Review of Systems  Constitutional: Negative for fever, chills and weight loss.  Respiratory: Positive for shortness of breath.   Cardiovascular: Negative for chest pain.  Gastrointestinal: Negative for nausea, vomiting and abdominal pain.  Musculoskeletal: Positive for back pain and joint pain.  Neurological: Positive for weakness. Negative for dizziness and headaches.  Psychiatric/Behavioral: Negative for substance abuse. The patient is not nervous/anxious.     Blood pressure 142/83, pulse 104, temperature 97.9 F (36.6 C), temperature source Oral, resp. rate 18, height 5' 8"  (1.727 m), weight 132.904 kg (293 lb), SpO2 97.00%. Physical Exam  Constitutional: He is oriented to person, place, and time. He appears well-nourished.  Cardiovascular: Normal rate.   No murmur  heard. Respiratory: Effort normal. He has no wheezes.  GI: Soft. Bowel sounds are normal. There is no tenderness.  Musculoskeletal: Normal range of motion. He exhibits edema and tenderness.  Left leg swelling; skin discoloration  Neurological: He is alert and oriented to person, place, and time.  Skin: Skin is warm and dry.  Psychiatric: He has a normal mood and affect. His behavior is normal. Judgment and thought content normal.     Assessment/Plan New LLE dvt while on coumadin therapy for B PE dx 02/25/2014 by [facility] Pt now scheduled for Inferior Vena Cava filter placement Pt is on life long anticoagulation therapy Pt aware of procedure benefits and risks and agreeable to proceed Consent signed and in chart  Ragan A 07/24/2014, 12:52 PM

## 2014-07-24 NOTE — Discharge Instructions (Signed)
Inferior Vena Cava Filter Insertion, Care After °Refer to this sheet in the next few weeks. These instructions provide you with information on caring for yourself after your procedure. Your health care provider may also give you more specific instructions. Your treatment has been planned according to current medical practices, but problems sometimes occur. Call your health care provider if you have any problems or questions after your procedure. °WHAT TO EXPECT AFTER THE PROCEDURE °After your procedure, it is typical to have the following: °· Mild pain in the area where the filter was inserted. °· Mild bruising in the area where the filter was inserted. °HOME CARE INSTRUCTIONS °· You will be given medicine to control pain. Only take over-the-counter or prescription medicines for pain, fever, or discomfort as directed by your health care provider. °· A bandage (dressing) has been placed over the insertion site. Follow your health care provider's instructions on how to care for it. °· Keep the insertion site clean and dry. °· Do not soak in a bath tub or pool until the filter insertion site has healed. °· Do not drive if you are taking narcotic pain medicines. Follow your health care provider's instructions about driving.  °· Do not return to work or school until your health care provider says it is okay.   °· Keep all follow-up appointments.   °SEEK IMMEDIATE MEDICAL CARE IF: °· You develop swelling and discoloration or pain in the legs. °· Your legs become pale and cold or blue. °· You develop shortness of breath, feel faint, or pass out. °· You develop chest pain, a cough, or difficulty breathing. °· You cough up blood. °· You develop a rash or feel you are having problems that may be a side effect of medicines. °· You develop weakness, difficulty moving your arms or legs, or balance problems. °· You develop problems with speech or vision. °Document Released: 09/14/2013 Document Reviewed: 09/14/2013 °[employer]®  Patient Information ©2015 [employer]. This information is not intended to replace advice given to you by your health care provider. Make sure you discuss any questions you have with your health care provider. ° °

## 2014-12-19 ENCOUNTER — Other Ambulatory Visit: Payer: Self-pay | Admitting: Urology

## 2014-12-20 NOTE — Progress Notes (Signed)
Called Dr. Zettie Pho office requested orders be released in Epic to sign and held surgery 01-03-15 pre op 12-29-14 Thanks

## 2014-12-29 ENCOUNTER — Encounter (HOSPITAL_COMMUNITY): Admission: RE | Admit: 2014-12-29 | Payer: Medicare Other | Source: Ambulatory Visit

## 2014-12-29 NOTE — Patient Instructions (Addendum)
Travis Rollins  12/29/2014   Your procedure is scheduled on: 01/03/2015    Report to [facility] Main  Entrance and follow signs to               [place] at      0930 AM.  Call this number if you have problems the morning of surgery [phone]   Remember: Eat a good healthy snack prior to bedtime.    Do not eat food or drink liquids :After Midnight.     Take these medicines the morning of surgery with A SIP OF WATER:  Flomax              Take 1/2 of evening dose of Insulin nite before surgery.               No diabetic medications am of surgery                                You may not have any metal on your body including hair pins and              piercings  Do not wear jewelry,  lotions, powders or perfumes.                            Men may shave face and neck.   Do not bring valuables to the hospital. [place].  Contacts, dentures or bridgework may not be worn into surgery.       Patients discharged the day of surgery will not be allowed to drive home.  Name and phone number of your driver:  Special Instructions: coughing and deep breathing exercises, leg exercises               Please read over the following fact sheets you were given: _____________________________________________________________________             [facility] - Preparing for Surgery Before surgery, you can play an important role.  Because skin is not sterile, your skin needs to be as free of germs as possible.  You can reduce the number of germs on your skin by washing with CHG (chlorahexidine gluconate) soap before surgery.  CHG is an antiseptic cleaner which kills germs and bonds with the skin to continue killing germs even after washing. Please DO NOT use if you have an allergy to CHG or antibacterial soaps.  If your skin becomes reddened/irritated stop using the CHG and inform your nurse when you arrive at  Short Stay. Do not shave (including legs and underarms) for at least 48 hours prior to the first CHG shower.  You may shave your face/neck. Please follow these instructions carefully:  1.  Shower with CHG Soap the night before surgery and the  morning of Surgery.  2.  If you choose to wash your hair, wash your hair first as usual with your  normal  shampoo.  3.  After you shampoo, rinse your hair and body thoroughly to remove the  shampoo.                           4.  Use CHG  as you would any other liquid soap.  You can apply chg directly  to the skin and wash                       Gently with a scrungie or clean washcloth.  5.  Apply the CHG Soap to your body ONLY FROM THE NECK DOWN.   Do not use on face/ open                           Wound or open sores. Avoid contact with eyes, ears mouth and genitals (private parts).                       Wash face,  Genitals (private parts) with your normal soap.             6.  Wash thoroughly, paying special attention to the area where your surgery  will be performed.  7.  Thoroughly rinse your body with warm water from the neck down.  8.  DO NOT shower/wash with your normal soap after using and rinsing off  the CHG Soap.                9.  Pat yourself dry with a clean towel.            10.  Wear clean pajamas.            11.  Place clean sheets on your bed the night of your first shower and do not  sleep with pets. Day of Surgery : Do not apply any lotions/deodorants the morning of surgery.  Please wear clean clothes to the hospital/surgery center.  FAILURE TO FOLLOW THESE INSTRUCTIONS MAY RESULT IN THE CANCELLATION OF YOUR SURGERY PATIENT SIGNATURE_________________________________  NURSE SIGNATURE__________________________________  ________________________________________________________________________

## 2015-01-02 ENCOUNTER — Encounter (HOSPITAL_COMMUNITY)
Admission: RE | Admit: 2015-01-02 | Discharge: 2015-01-02 | Disposition: A | Discharge status: Home / Self Care | Payer: Medicare Other | Source: Ambulatory Visit | Attending: Urology | Admitting: Urology

## 2015-01-02 ENCOUNTER — Encounter (HOSPITAL_COMMUNITY): Payer: Self-pay

## 2015-01-02 ENCOUNTER — Ambulatory Visit (HOSPITAL_COMMUNITY)
Admission: RE | Admit: 2015-01-02 | Discharge: 2015-01-02 | Disposition: A | Discharge status: Home / Self Care | Payer: Medicare Other | Source: Ambulatory Visit | Attending: Urology | Admitting: Urology

## 2015-01-02 DIAGNOSIS — Z86711 Personal history of pulmonary embolism: Secondary | ICD-10-CM | POA: Diagnosis not present

## 2015-01-02 DIAGNOSIS — Z794 Long term (current) use of insulin: Secondary | ICD-10-CM | POA: Diagnosis not present

## 2015-01-02 DIAGNOSIS — I739 Peripheral vascular disease, unspecified: Secondary | ICD-10-CM | POA: Diagnosis not present

## 2015-01-02 DIAGNOSIS — Z86718 Personal history of other venous thrombosis and embolism: Secondary | ICD-10-CM | POA: Diagnosis not present

## 2015-01-02 DIAGNOSIS — N200 Calculus of kidney: Secondary | ICD-10-CM | POA: Diagnosis not present

## 2015-01-02 DIAGNOSIS — I10 Essential (primary) hypertension: Secondary | ICD-10-CM | POA: Diagnosis not present

## 2015-01-02 DIAGNOSIS — G4730 Sleep apnea, unspecified: Secondary | ICD-10-CM | POA: Diagnosis not present

## 2015-01-02 DIAGNOSIS — N029 Recurrent and persistent hematuria with unspecified morphologic changes: Secondary | ICD-10-CM | POA: Diagnosis present

## 2015-01-02 DIAGNOSIS — Z01818 Encounter for other preprocedural examination: Secondary | ICD-10-CM

## 2015-01-02 DIAGNOSIS — E119 Type 2 diabetes mellitus without complications: Secondary | ICD-10-CM | POA: Diagnosis not present

## 2015-01-02 HISTORY — DX: Other pulmonary embolism without acute cor pulmonale: I26.99

## 2015-01-02 LAB — CBC
HCT: 42.7 % (ref 39.0–52.0)
HEMOGLOBIN: 14.1 g/dL (ref 13.0–17.0)
MCH: 31.3 pg (ref 26.0–34.0)
MCHC: 33 g/dL (ref 30.0–36.0)
MCV: 94.9 fL (ref 78.0–100.0)
Platelets: 94 10*3/uL — ABNORMAL LOW (ref 150–400)
RBC: 4.5 MIL/uL (ref 4.22–5.81)
RDW: 14 % (ref 11.5–15.5)
WBC: 4.3 10*3/uL (ref 4.0–10.5)

## 2015-01-02 LAB — BASIC METABOLIC PANEL
ANION GAP: 8 (ref 5–15)
BUN: 12 mg/dL (ref 6–23)
CO2: 28 mmol/L (ref 19–32)
Calcium: 9.1 mg/dL (ref 8.4–10.5)
Chloride: 103 mmol/L (ref 96–112)
Creatinine, Ser: 0.75 mg/dL (ref 0.50–1.35)
GFR calc Af Amer: >90 mL/min (ref >90)
GLUCOSE: 199 mg/dL — AB (ref 70–99)
Potassium: 4.4 mmol/L (ref 3.5–5.1)
SODIUM: 139 mmol/L (ref 135–145)

## 2015-01-02 LAB — SURGICAL PCR SCREEN
MRSA, PCR: NEGATIVE
STAPHYLOCOCCUS AUREUS: NEGATIVE

## 2015-01-02 MED ORDER — GENTAMICIN SULFATE 40 MG/ML IJ SOLN
5.0000 mg/kg | INTRAVENOUS | Status: AC
  Administered 2015-01-03: 480 mg via INTRAVENOUS
  Filled 2015-01-02: qty 12

## 2015-01-02 NOTE — Progress Notes (Signed)
01-02-15 1620 Labs viewable in Epic, note platelets. Note sent to Dr. Tresa Moore per [place] fax.

## 2015-01-02 NOTE — Progress Notes (Addendum)
EKG- 02/24/2014 EPIC  ECHO- 02/26/2014 EPIC  LOV with Dr Alphonzo Grieve- 11/28/14 on chart

## 2015-01-03 ENCOUNTER — Encounter (HOSPITAL_COMMUNITY)
Admission: RE | Disposition: A | Discharge status: Home / Self Care | Payer: Self-pay | Source: Ambulatory Visit | Attending: Urology

## 2015-01-03 ENCOUNTER — Ambulatory Visit (HOSPITAL_COMMUNITY): Payer: Medicare Other | Admitting: Anesthesiology

## 2015-01-03 ENCOUNTER — Ambulatory Visit (HOSPITAL_COMMUNITY)
Admission: RE | Admit: 2015-01-03 | Discharge: 2015-01-03 | Disposition: A | Discharge status: Home / Self Care | Payer: Medicare Other | Source: Ambulatory Visit | Attending: Urology | Admitting: Urology

## 2015-01-03 ENCOUNTER — Encounter (HOSPITAL_COMMUNITY): Payer: Self-pay | Admitting: *Deleted

## 2015-01-03 DIAGNOSIS — I739 Peripheral vascular disease, unspecified: Secondary | ICD-10-CM | POA: Insufficient documentation

## 2015-01-03 DIAGNOSIS — I10 Essential (primary) hypertension: Secondary | ICD-10-CM | POA: Insufficient documentation

## 2015-01-03 DIAGNOSIS — E119 Type 2 diabetes mellitus without complications: Secondary | ICD-10-CM | POA: Insufficient documentation

## 2015-01-03 DIAGNOSIS — G4730 Sleep apnea, unspecified: Secondary | ICD-10-CM | POA: Diagnosis not present

## 2015-01-03 DIAGNOSIS — Z86718 Personal history of other venous thrombosis and embolism: Secondary | ICD-10-CM | POA: Insufficient documentation

## 2015-01-03 DIAGNOSIS — Z86711 Personal history of pulmonary embolism: Secondary | ICD-10-CM | POA: Insufficient documentation

## 2015-01-03 DIAGNOSIS — N200 Calculus of kidney: Secondary | ICD-10-CM | POA: Diagnosis not present

## 2015-01-03 DIAGNOSIS — Z794 Long term (current) use of insulin: Secondary | ICD-10-CM | POA: Insufficient documentation

## 2015-01-03 HISTORY — PX: HOLMIUM LASER APPLICATION: SHX5852

## 2015-01-03 HISTORY — PX: CYSTOSCOPY WITH RETROGRADE PYELOGRAM, URETEROSCOPY AND STENT PLACEMENT: SHX5789

## 2015-01-03 LAB — GLUCOSE, CAPILLARY
Glucose-Capillary: 215 mg/dL — ABNORMAL HIGH (ref 70–99)
Glucose-Capillary: 228 mg/dL — ABNORMAL HIGH (ref 70–99)

## 2015-01-03 SURGERY — CYSTOURETEROSCOPY, WITH RETROGRADE PYELOGRAM AND STENT INSERTION
Anesthesia: General | Laterality: Right

## 2015-01-03 MED ORDER — LACTATED RINGERS IV SOLN
INTRAVENOUS | Status: DC
  Administered 2015-01-03: 1000 mL via INTRAVENOUS

## 2015-01-03 MED ORDER — PROPOFOL 10 MG/ML IV BOLUS
INTRAVENOUS | Status: AC
  Filled 2015-01-03: qty 20

## 2015-01-03 MED ORDER — SODIUM CHLORIDE 0.9 % IR SOLN
Status: DC | PRN
  Administered 2015-01-03: 3000 mL

## 2015-01-03 MED ORDER — PROPOFOL 10 MG/ML IV BOLUS
INTRAVENOUS | Status: DC | PRN
  Administered 2015-01-03: 250 mg via INTRAVENOUS

## 2015-01-03 MED ORDER — SUCCINYLCHOLINE CHLORIDE 20 MG/ML IJ SOLN
INTRAMUSCULAR | Status: DC | PRN
  Administered 2015-01-03: 140 mg via INTRAVENOUS

## 2015-01-03 MED ORDER — FENTANYL CITRATE 0.05 MG/ML IJ SOLN
INTRAMUSCULAR | Status: DC | PRN
  Administered 2015-01-03: 100 ug via INTRAVENOUS
  Administered 2015-01-03: 50 ug via INTRAVENOUS

## 2015-01-03 MED ORDER — LIDOCAINE HCL (CARDIAC) 20 MG/ML IV SOLN
INTRAVENOUS | Status: AC
  Filled 2015-01-03: qty 5

## 2015-01-03 MED ORDER — MIDAZOLAM HCL 5 MG/5ML IJ SOLN
INTRAMUSCULAR | Status: DC | PRN
  Administered 2015-01-03 (×2): 1 mg via INTRAVENOUS

## 2015-01-03 MED ORDER — FENTANYL CITRATE 0.05 MG/ML IJ SOLN
25.0000 ug | INTRAMUSCULAR | Status: DC | PRN
  Administered 2015-01-03 (×2): 25 ug via INTRAVENOUS

## 2015-01-03 MED ORDER — LACTATED RINGERS IV SOLN
INTRAVENOUS | Status: DC | PRN
  Administered 2015-01-03: 11:00:00 via INTRAVENOUS

## 2015-01-03 MED ORDER — OXYCODONE-ACETAMINOPHEN 5-325 MG PO TABS
1.0000 | ORAL_TABLET | Freq: Four times a day (QID) | ORAL | Status: DC | PRN
  Administered 2015-01-03: 2 via ORAL
  Filled 2015-01-03: qty 2

## 2015-01-03 MED ORDER — LACTATED RINGERS IV SOLN
INTRAVENOUS | Status: DC
Start: 2015-01-03 — End: 2015-01-03

## 2015-01-03 MED ORDER — INSULIN ASPART 100 UNIT/ML ~~LOC~~ SOLN: 0.0000 [IU] | SUBCUTANEOUS | Status: DC

## 2015-01-03 MED ORDER — SODIUM CHLORIDE 0.9 % IR SOLN
Status: DC | PRN
  Administered 2015-01-03: 2000 mL

## 2015-01-03 MED ORDER — SODIUM CHLORIDE 0.9 % IJ SOLN
INTRAMUSCULAR | Status: AC
Start: 2015-01-03 — End: 2015-01-03
  Filled 2015-01-03: qty 10

## 2015-01-03 MED ORDER — SULFAMETHOXAZOLE-TRIMETHOPRIM 800-160 MG PO TABS: 1.0000 | ORAL_TABLET | Freq: Two times a day (BID) | ORAL | Status: DC

## 2015-01-03 MED ORDER — SENNOSIDES-DOCUSATE SODIUM 8.6-50 MG PO TABS: 1.0000 | ORAL_TABLET | Freq: Two times a day (BID) | ORAL | Status: DC

## 2015-01-03 MED ORDER — PROMETHAZINE HCL 25 MG/ML IJ SOLN
12.5000 mg | Freq: Once | INTRAMUSCULAR | Status: AC
  Administered 2015-01-03: 6.25 mg via INTRAVENOUS
  Filled 2015-01-03: qty 1

## 2015-01-03 MED ORDER — FENTANYL CITRATE 0.05 MG/ML IJ SOLN
INTRAMUSCULAR | Status: AC
  Filled 2015-01-03: qty 5

## 2015-01-03 MED ORDER — ONDANSETRON HCL 4 MG/2ML IJ SOLN
INTRAMUSCULAR | Status: AC
  Filled 2015-01-03: qty 2

## 2015-01-03 MED ORDER — MIDAZOLAM HCL 2 MG/2ML IJ SOLN
INTRAMUSCULAR | Status: AC
  Filled 2015-01-03: qty 2

## 2015-01-03 MED ORDER — IOHEXOL 300 MG/ML  SOLN
INTRAMUSCULAR | Status: DC | PRN
  Administered 2015-01-03: 50 mL via ORAL

## 2015-01-03 MED ORDER — LIDOCAINE HCL (CARDIAC) 20 MG/ML IV SOLN
INTRAVENOUS | Status: DC | PRN
  Administered 2015-01-03: 100 mg via INTRAVENOUS

## 2015-01-03 MED ORDER — OXYCODONE-ACETAMINOPHEN 5-325 MG PO TABS: 1.0000 | ORAL_TABLET | Freq: Four times a day (QID) | ORAL | Status: DC | PRN

## 2015-01-03 MED ORDER — LACTATED RINGERS IV SOLN: INTRAVENOUS | Status: DC

## 2015-01-03 MED ORDER — FENTANYL CITRATE 0.05 MG/ML IJ SOLN
INTRAMUSCULAR | Status: DC
Start: 2015-01-03 — End: 2015-01-03
  Filled 2015-01-03: qty 2

## 2015-01-03 MED ORDER — ONDANSETRON HCL 4 MG/2ML IJ SOLN
INTRAMUSCULAR | Status: DC | PRN
  Administered 2015-01-03: 4 mg via INTRAVENOUS

## 2015-01-03 SURGICAL SUPPLY — 27 items
BAG URINE DRAINAGE (UROLOGICAL SUPPLIES) IMPLANT
BASKET LASER NITINOL 1.9FR (BASKET) ×4 IMPLANT
BASKET STNLS GEMINI 4WIRE 3FR (BASKET) IMPLANT
BASKET ZERO TIP NITINOL 2.4FR (BASKET) IMPLANT
CATH INTERMIT  6FR 70CM (CATHETERS) ×4 IMPLANT
CLOTH BEACON ORANGE TIMEOUT ST (SAFETY) ×4 IMPLANT
DRAPE CAMERA CLOSED 9X96 (DRAPES) IMPLANT
ELECT REM PT RETURN 9FT ADLT (ELECTROSURGICAL)
ELECTRODE REM PT RTRN 9FT ADLT (ELECTROSURGICAL) IMPLANT
FIBER LASER FLEXIVA 1000 (UROLOGICAL SUPPLIES) IMPLANT
FIBER LASER FLEXIVA 200 (UROLOGICAL SUPPLIES) ×4 IMPLANT
FIBER LASER FLEXIVA 365 (UROLOGICAL SUPPLIES) IMPLANT
FIBER LASER FLEXIVA 550 (UROLOGICAL SUPPLIES) IMPLANT
FIBER LASER TRAC TIP (UROLOGICAL SUPPLIES) ×4 IMPLANT
GLOVE BIOGEL M STRL SZ7.5 (GLOVE) ×4 IMPLANT
GOWN STRL REUS W/TWL LRG LVL3 (GOWN DISPOSABLE) ×12 IMPLANT
GUIDEWIRE ANG ZIPWIRE 038X150 (WIRE) ×4 IMPLANT
GUIDEWIRE STR DUAL SENSOR (WIRE) ×4 IMPLANT
HOVERMATT HALF SINGLE USE (PATIENT TRANSFER) ×4 IMPLANT
IV NS IRRIG 3000ML ARTHROMATIC (IV SOLUTION) ×4 IMPLANT
PACK CYSTO (CUSTOM PROCEDURE TRAY) ×4 IMPLANT
SHEATH ACCESS URETERAL 38CM (SHEATH) ×4 IMPLANT
SHIELD EYE BINOCULAR (MISCELLANEOUS) IMPLANT
STENT POLARIS 5FRX24 (STENTS) ×4 IMPLANT
SYRINGE 10CC LL (SYRINGE) IMPLANT
SYRINGE IRR TOOMEY STRL 70CC (SYRINGE) IMPLANT
TUBE FEEDING 8FR 16IN STR KANG (MISCELLANEOUS) ×4 IMPLANT

## 2015-01-03 NOTE — Anesthesia Preprocedure Evaluation (Addendum)
Anesthesia Evaluation  Patient identified by MRN, date of birth, ID band Patient awake    Reviewed: Allergy & Precautions, H&P , NPO status , Patient's Chart, lab work & pertinent test results  Airway Mallampati: III  TM Distance: >3 FB Neck ROM: full    Dental  (+) Edentulous Upper, Dental Advisory Given, Missing Missing lower front and most back lower:   Pulmonary shortness of breath and with exertion, sleep apnea , PE PE x 3. History acute respiratory failure with hypoxia breath sounds clear to auscultation  Pulmonary exam normal       Cardiovascular Exercise Tolerance: Good hypertension, negative cardio ROS  Rhythm:regular Rate:Normal     Neuro/Psych negative neurological ROS  negative psych ROS   GI/Hepatic negative GI ROS, Neg liver ROS,   Endo/Other  diabetes, Well Controlled, Type 2, Insulin DependentMorbid obesity  Renal/GU negative Renal ROS  negative genitourinary   Musculoskeletal   Abdominal (+) + obese,   Peds  Hematology negative hematology ROS (+) Thrombocytopenia - mild   Anesthesia Other Findings   Reproductive/Obstetrics negative OB ROS                           Anesthesia Physical Anesthesia Plan  ASA: III  Anesthesia Plan: General   Post-op Pain Management:    Induction: Intravenous  Airway Management Planned: LMA  Additional Equipment:   Intra-op Plan:   Post-operative Plan:   Informed Consent: I have reviewed the patients History and Physical, chart, labs and discussed the procedure including the risks, benefits and alternatives for the proposed anesthesia with the patient or authorized representative who has indicated his/her understanding and acceptance.   Dental Advisory Given  Plan Discussed with: CRNA and Surgeon  Anesthesia Plan Comments:         Anesthesia Quick Evaluation

## 2015-01-03 NOTE — Anesthesia Postprocedure Evaluation (Signed)
  Anesthesia Post-op Note  Patient: Travis Rollins  Procedure(s) Performed: Procedure(s) (LRB): CYSTOSCOPY WITH RETROGRADE PYELOGRAM, RIGHT URETEROSCOPY AND RIGHT STENT PLACEMENT (Bilateral) HOLMIUM LASER APPLICATION (Right)  Patient Location: PACU  Anesthesia Type: General  Level of Consciousness: awake and alert   Airway and Oxygen Therapy: Patient Spontanous Breathing  Post-op Pain: mild  Post-op Assessment: Post-op Vital signs reviewed, Patient's Cardiovascular Status Stable, Respiratory Function Stable, Patent Airway and No signs of Nausea or vomiting  Last Vitals:  Filed Vitals:   01/03/15 1345  BP: 131/90  Pulse: 91  Temp:   Resp: 21    Post-op Vital Signs: stable   Complications: No apparent anesthesia complications

## 2015-01-03 NOTE — Transfer of Care (Signed)
Immediate Anesthesia Transfer of Care Note  Patient: Travis Rollins  Procedure(s) Performed: Procedure(s): CYSTOSCOPY WITH RETROGRADE PYELOGRAM, RIGHT URETEROSCOPY AND RIGHT STENT PLACEMENT (Bilateral) HOLMIUM LASER APPLICATION (Right)  Patient Location: PACU  Anesthesia Type:General  Level of Consciousness: awake, alert  and oriented  Airway & Oxygen Therapy: Patient Spontanous Breathing and Patient connected to face mask oxygen  Post-op Assessment: Report given to PACU RN and Post -op Vital signs reviewed and stable  Post vital signs: Reviewed and stable  Complications: No apparent anesthesia complications

## 2015-01-03 NOTE — H&P (Signed)
Travis Rollins is an 49 y.o. male.    Chief Complaint: Pre-Op Right Ureteroscopic Stone Manipulation, Bilateral Retrogrades  HPI:    1 - Nephrolithiasis -   Recent Surveillance: 05/2014 - Rt 63m UVJ stone passed with medical therapy; Rt lower pole 117mtotal volume stone CT 09/2014 - stable Rt lower pole stones total volume 1145msimilar to prior).  2 - Lower Urinary Tract Symptoms - pt with increasing bother from sense of hesitancy and incomplete emptying. DRE 45 gm smooth. PVR 181 1 hr after void. No h/o straddle injury.  Now on tamsulosin QD with improvement. Cysto 2015 with some trilobar hypertrophy.   3 - Gross Hematuria - New gross hematuria 08/2014. CT Urogram with residual rt lower pole renal stone only. Persistatn despite being off anticoagulation with gross blood visible daily.   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   Today Travis Rollins seen to proceed with right ureteroscopic stone manipulation and bilateral retrogrades for further evaluation and management of his recurrent hematuria and right renal stone. No interval fevers.   Past Medical History  Diagnosis Date  . Hypertension   . Deep vein thrombosis   . Obesity   . Diabetes mellitus   . Peripheral vascular disease     6 yrs ago, DVT in Lt knee/ groin  . Anemia   . Shortness of breath     with exertion   . Pulmonary embolism     hx of x 3 per patient     Past Surgical History  Procedure Laterality Date  . Tonsillectomy    . Ivc filter   07/2014     No family history on file. Social History:  reports that he has never smoked. He has never used smokeless tobacco. He reports that he drinks alcohol. He reports that he does not use illicit drugs.  Allergies:  Allergies  Allergen Reactions  . Penicillins Hives    No prescriptions prior to admission    Results for orders placed or performed during the hospital encounter of 01/02/15 (from the  past 48 hour(s))  CBC     Status: Abnormal   Collection Time: 01/02/15 12:00 PM  Result Value Ref Range   WBC 4.3 4.0 - 10.5 K/uL   RBC 4.50 4.22 - 5.81 MIL/uL   Hemoglobin 14.1 13.0 - 17.0 g/dL   HCT 42.7 39.0 - 52.0 %   MCV 94.9 78.0 - 100.0 fL   MCH 31.3 26.0 - 34.0 pg   MCHC 33.0 30.0 - 36.0 g/dL   RDW 14.0 11.5 - 15.5 %   Platelets 94 (L) 150 - 400 K/uL    Comment: SPECIMEN CHECKED FOR CLOTS REPEATED TO VERIFY PLATELET COUNT CONFIRMED BY SMEAR   Basic metabolic panel     Status: Abnormal   Collection Time: 01/02/15 12:00 PM  Result Value Ref Range   Sodium 139 135 - 145 mmol/L   Potassium 4.4 3.5 - 5.1 mmol/L   Chloride 103 96 - 112 mmol/L   CO2 28 19 - 32 mmol/L   Glucose, Bld 199 (H) 70 - 99 mg/dL   BUN 12 6 - 23 mg/dL   Creatinine, Ser 0.75 0.50 - 1.35 mg/dL   Calcium 9.1 8.4 - 10.5 mg/dL   GFR calc non Af Amer >90 >90 mL/min   GFR calc Af Amer >90 >90 mL/min    Comment: (NOTE) The eGFR has been calculated using the CKD EPI equation.  This calculation has not been validated in all clinical situations. eGFR's persistently <90 mL/min signify possible Chronic Kidney Disease.    Anion gap 8 5 - 15  Surgical pcr screen     Status: None   Collection Time: 01/02/15 12:05 PM  Result Value Ref Range   MRSA, PCR NEGATIVE NEGATIVE   Staphylococcus aureus NEGATIVE NEGATIVE    Comment:        The Xpert SA Assay (FDA approved for NASAL specimens in patients over 58 years of age), is one component of a comprehensive surveillance program.  Test performance has been validated by [facility] for patients greater than or equal to 32 year old. It is not intended to diagnose infection nor to guide or monitor treatment.    Dg Chest 2 View  01/02/2015   CLINICAL DATA:  Preoperative evaluation for kidney stone removal  EXAM: CHEST  2 VIEW  COMPARISON:  02/26/2014  FINDINGS: Cardiac shadow is within normal limits. The lungs are clear bilaterally. No focal infiltrate or sizable  effusion is noted. No acute bony abnormality is seen.  IMPRESSION: No active cardiopulmonary disease.   Electronically Signed   By: Inez Catalina M.D.   On: 01/02/2015 16:05    Review of Systems  Constitutional: Negative.  Negative for fever, chills and malaise/fatigue.  HENT: Negative.   Eyes: Negative.   Respiratory: Negative.   Cardiovascular: Negative.   Gastrointestinal: Negative.   Genitourinary: Positive for hematuria. Negative for flank pain.  Musculoskeletal: Negative.   Skin: Negative.   Neurological: Negative.   Endo/Heme/Allergies: Negative.   Psychiatric/Behavioral: Negative.     There were no vitals taken for this visit. Physical Exam  Constitutional: He appears well-developed.  HENT:  Head: Normocephalic.  Eyes: Pupils are equal, round, and reactive to light.  Neck: Normal range of motion.  Cardiovascular: Normal rate.   Respiratory: Effort normal.  GI: Soft.  Genitourinary: Penis normal.  No CVAT  Musculoskeletal: Normal range of motion.  Neurological: He is alert.  Skin: Skin is warm.  Psychiatric: He has a normal mood and affect. His behavior is normal. Judgment and thought content normal.     Assessment/Plan  1 - Nephrolithiasis  - stable moderate volume lower pole stone in low-risk location on right. Discussed observaiton v. treatment with elective ureterocopy to help verify cause of hematuria, understanding that he is more risky than most men his age in terms of operative therapy with his obesity and diabetes. He has very good understanding of this and wants to proceed. I agree as it would be good to help confirm no other etiology for hematuria.   We rediscussed ureteroscopic stone manipulation with basketing and laser-lithotripsy in detail.  We rediscussed risks including bleeding, infection, damage to kidney / ureter  bladder, rarely loss of kidney. We rediscussed anesthetic risks and rare but serious surgical complications including DVT, PE, MI, and  mortality. We specifically readdressed that in 5-10% of cases a staged approach is required with stenting followed by re-attempt ureteroscopy if anatomy unfavorable.   The patient voiced understanding and wises to proceed today as planned.    2 -  Lower Urinary Tract Symptoms - Good response to alpha blocker, continue. Would consider 5ARI or even TURP in future shoud symptoms progress as he does have significant intraluminal hypertrophy including medial lobe.  3 - Gross Hematuria - Eval wtih exam, labs, CT, cysto with right intrarenal stone in setting of anticoagulation as likley cuase  Miliyah Luper 01/03/2015, 5:59 AM

## 2015-01-03 NOTE — Brief Op Note (Signed)
01/03/2015  12:19 PM  PATIENT:  Travis Rollins  49 y.o. male  PRE-OPERATIVE DIAGNOSIS:  RIGHT RENAL STONES, RECURRENT HEMATURIA  POST-OPERATIVE DIAGNOSIS:  RIGHT RENAL STONES, RECURRENT HEMATURIA  PROCEDURE:  Procedure(s): CYSTOSCOPY WITH RETROGRADE PYELOGRAM, RIGHT URETEROSCOPY AND RIGHT STENT PLACEMENT (Bilateral) HOLMIUM LASER APPLICATION (Right)  SURGEON:  Surgeon(s) and Role:    * Alexis Frock, MD - Primary  PHYSICIAN ASSISTANT:   ASSISTANTS: none   ANESTHESIA:   general  EBL:     BLOOD ADMINISTERED:none  DRAINS: none   LOCAL MEDICATIONS USED:  NONE  SPECIMEN:  Source of Specimen:  Rt renal stone fragments  DISPOSITION OF SPECIMEN:  [facility] for compositional analysis  COUNTS:  YES  TOURNIQUET:  * No tourniquets in log *  DICTATION: .Other Dictation: Dictation Number [id number]  PLAN OF CARE: Discharge to home after PACU  PATIENT DISPOSITION:  PACU - hemodynamically stable.   Delay start of Pharmacological VTE agent (>24hrs) due to surgical blood loss or risk of bleeding: yes

## 2015-01-03 NOTE — Discharge Instructions (Signed)
1 - You may have urinary urgency (bladder spasms) and bloody urine on / off with stent in place. This is normal. ° °2 - Call MD or go to ER for fever >102, severe pain / nausea / vomiting not relieved by medications, or acute change in medical status ° °

## 2015-01-04 ENCOUNTER — Encounter (HOSPITAL_COMMUNITY): Payer: Self-pay | Admitting: Urology

## 2015-01-04 NOTE — Op Note (Signed)
NAMEBRENON, ANTOSH            ACCOUNT NO.:  [id number]  MEDICAL RECORD NO.:  [id number]  LOCATION:  [facility]                         FACILITY:  [facility]  PHYSICIAN:  Alexis Frock, MD     DATE OF BIRTH:  Nov 16, 1966  DATE OF PROCEDURE:  01/03/2015 DATE OF DISCHARGE:  01/03/2015                              OPERATIVE REPORT   DIAGNOSES:  Right renal stone, recurrent gross hematuria, and right flank pain.  PROCEDURE: 1. Cystoscopy with bilateral retrograde pyelogram with interpretation. 2. Right ureteroscopy with laser lithotripsy. 3. Insertion of right ureteral stent 5 x 24 Polaris, no tether.  ESTIMATED BLOOD LOSS:  Nil.  COMPLICATIONS:  None.  SPECIMEN:  Right renal stone fragments for compositional analysis.  FINDINGS: 1. Unremarkable urinary bladder. 2. Unremarkable left retrograde pyelogram. 3. Right retrograde pyelogram with filling defect in the mid kidney     infundibulum consistent with known stone. 4. Approximately 8-9 mm right renal stone.  This was fragmented and     removed. 5. Placement of right ureteral stent, proximal curl renal pelvis,     distal urinary bladder.  INDICATIONS:  Mr. Standre is a pleasant 49 year old gentleman with history of prior nephrolithiasis.  He has had intermittent gross hematuria.  It has been recurrent.  He has history of pulmonary embolism, has an IVC filter in place, however, his pharmacologic anticoagulation had been held given his hematuria.  He underwent evaluation with CT scan and office cystoscopy, which only revealed right renal stone as likely etiology.  Options were discussed including observation versus shockwave lithotripsy versus right ureteroscopy and he wished to proceed with the latter.  Informed consent was obtained and placed in medical record.  PROCEDURE IN DETAIL:  [name] verified. Procedure being right ureteroscopic stone manipulation and bilateral retrograde pyelogram was  confirmed.  Procedure was carried out.  Time- out was performed.  Intravenous antibiotics were administered.  General anesthesia was introduced.  The patient was placed into a low lithotomy position.  Sterile field was created by prepping and draping the patient's penis, perineum, proximal thighs using iodine x3.  Next, cystourethroscopy was performed using a 21-French rigid cystoscope with 30-degree offset lens.  Inspection of the anterior and posterior urethra were unremarkable.  Inspection of the urinary bladder revealed no diverticula, calcifications, or papular lesions.  As the patient has had recent gross hematuria, it was felt that bilateral retrograde pyelogram was clearly indicated, as such the left ureteral orifice was cannulated with a 6-French end-hole catheter and left retrograde pyelogram was obtained.  Left retrograde pyelogram demonstrated single left ureter, single system left kidney.  No filling defects or narrowing were noted.  Similarly, right retrograde pyelogram was obtained.  Right retrograde pyelogram demonstrated single right ureter, single system right kidney.  There was a filling defect in the mid pole infundibulum with some laser contrast going pass, this was felt to represent likely partially impacted right intrarenal stone.  A 0.038 zip wire was advanced at the level of the upper pole and set aside as a safety wire.  An 8-French feeding tube was placed in the urinary bladder for pressure release.  Next, semi-rigid ureteroscopy was performed of the distal floor, first the  right ureter alongside a separate Sensor working wire.  No mucosal abnormalities were found.  The semi-rigid ureteroscope was exchanged for the 12/14 medium-length ureteral access sheath at the level the proximal ureter using continuous fluoroscopic guidance.  Next, flexible digital ureteroscopy was performed using a dual-channel flexible digital ureteroscope.  Inspection of the  proximal ureter and systematic inspection of the kidney revealed a dominant calcification as expected and somewhat impacted in the mid pole infundibulum, this appeared to be much too large for simple basketing. As such, holmium laser energy was applied to the stone using settings of 0.2 joules and 10 Hz fragmenting the stone into pieces, approximately 2 mm or less in diameter.  These were then sequentially grasped with an escape type basket and brought out in their entirety and set aside for compositional analysis.  Repeat inspection of the kidney revealed complete resolution of all stone fragments larger than 1/3 mm.  There was excellent hemostasis.  No evidence of perforation.  The access sheath was removed under continuous ureteroscopic vision and no obvious mucosal abnormalities were found.  Given the relatively large stone and need for access sheath, it was felt that ureteral stenting was clearly warranted.  As such, a new 5 x 24 Polaris-type stent was placed using cystoscopic and fluoroscopic guidance.  Good proximal and distal deployment were noted.  Bladder was emptied per cystoscope.  Procedure was then terminated.  The patient tolerated the procedure well.  There were no immediate periprocedural complications.  The patient was taken to postanesthesia care unit in a stable condition.          ______________________________ Alexis Frock, MD     TM/MEDQ  D:  01/03/2015  T:  01/04/2015  Job:  [id number]

## 2015-03-23 ENCOUNTER — Emergency Department (HOSPITAL_COMMUNITY): Payer: Medicare Other

## 2015-03-23 ENCOUNTER — Encounter (HOSPITAL_COMMUNITY): Payer: Self-pay

## 2015-03-23 ENCOUNTER — Emergency Department (HOSPITAL_COMMUNITY)
Admission: EM | Admit: 2015-03-23 | Discharge: 2015-03-23 | Disposition: A | Discharge status: Home / Self Care | Payer: Medicare Other | Attending: Emergency Medicine | Admitting: Emergency Medicine

## 2015-03-23 DIAGNOSIS — Z86711 Personal history of pulmonary embolism: Secondary | ICD-10-CM | POA: Diagnosis not present

## 2015-03-23 DIAGNOSIS — Z79899 Other long term (current) drug therapy: Secondary | ICD-10-CM | POA: Diagnosis not present

## 2015-03-23 DIAGNOSIS — Z880 Allergy status to penicillin: Secondary | ICD-10-CM | POA: Diagnosis not present

## 2015-03-23 DIAGNOSIS — E119 Type 2 diabetes mellitus without complications: Secondary | ICD-10-CM | POA: Diagnosis not present

## 2015-03-23 DIAGNOSIS — Z794 Long term (current) use of insulin: Secondary | ICD-10-CM | POA: Diagnosis not present

## 2015-03-23 DIAGNOSIS — M25562 Pain in left knee: Secondary | ICD-10-CM | POA: Diagnosis present

## 2015-03-23 DIAGNOSIS — Z86718 Personal history of other venous thrombosis and embolism: Secondary | ICD-10-CM | POA: Diagnosis not present

## 2015-03-23 DIAGNOSIS — M1712 Unilateral primary osteoarthritis, left knee: Secondary | ICD-10-CM

## 2015-03-23 DIAGNOSIS — I10 Essential (primary) hypertension: Secondary | ICD-10-CM | POA: Insufficient documentation

## 2015-03-23 DIAGNOSIS — D649 Anemia, unspecified: Secondary | ICD-10-CM | POA: Insufficient documentation

## 2015-03-23 DIAGNOSIS — Z792 Long term (current) use of antibiotics: Secondary | ICD-10-CM | POA: Insufficient documentation

## 2015-03-23 DIAGNOSIS — E669 Obesity, unspecified: Secondary | ICD-10-CM | POA: Insufficient documentation

## 2015-03-23 MED ORDER — HYDROCODONE-ACETAMINOPHEN 5-325 MG PO TABS: ORAL_TABLET | ORAL | Status: DC

## 2015-03-23 NOTE — ED Provider Notes (Signed)
CSN: [id number]     Arrival date & time 03/23/15  1012 History   First MD Initiated Contact with Patient 03/23/15 1015     Chief Complaint  Patient presents with  . Knee Pain      HPI Pt was seen at 1025. Per pt, c/o gradual onset and persistence of constant left knee "pain" for the past 3 days. Pt describes the pain as "sore," located in his anterior knee/patellar area. Pt states he was evaluated by his PMD on 03/12/15 for LLE "swelling." Pt states he had a Vascular US completed at [place] on 03/16/15, dx DVT, rx lovenox. Endorses compliance with taking the lovenox daily since rx on Monday, with the exception of today's dose. Hx recurrent DVT and PE, on lifelong anticoagulation and has IVC filter in place. Denies CP/SOB, no abd pain, no N/V/D, no back pain, no focal motor weakness, no tingling/numbness in extremities, no rash, no fevers, no injury.    Past Medical History  Diagnosis Date  . Hypertension   . Deep vein thrombosis 07/2013, 10/2014, 03/16/2015    LLE (studies at [facility])  . Obesity   . Diabetes mellitus   . Peripheral vascular disease     6 yrs ago, DVT in Lt knee/ groin  . Anemia   . Shortness of breath     with exertion   . Pulmonary embolism     hx of x 3 per patient; most recent 02/2014   Past Surgical History  Procedure Laterality Date  . Tonsillectomy    . Ivc filter   07/2014   . Cystoscopy with retrograde pyelogram, ureteroscopy and stent placement Bilateral 01/03/2015    Procedure: CYSTOSCOPY WITH RETROGRADE PYELOGRAM, RIGHT URETEROSCOPY AND RIGHT STENT PLACEMENT;  Surgeon: Alexis Frock, MD;  Location: [facility];  Service: Urology;  Laterality: Bilateral;  . Holmium laser application Right 9/56/3875    Procedure: HOLMIUM LASER APPLICATION;  Surgeon: Alexis Frock, MD;  Location: [facility];  Service: Urology;  Laterality: Right;    History  Substance Use Topics  . Smoking status: Never Smoker   . Smokeless tobacco: Never Used  . Alcohol Use: Yes      Comment: rare    Review of Systems ROS: Statement: All systems negative except as marked or noted in the HPI; Constitutional: Negative for fever and chills. ; ; Eyes: Negative for eye pain, redness and discharge. ; ; ENMT: Negative for ear pain, hoarseness, nasal congestion, sinus pressure and sore throat. ; ; Cardiovascular: Negative for chest pain, palpitations, diaphoresis, dyspnea and peripheral edema. ; ; Respiratory: Negative for cough, wheezing and stridor. ; ; Gastrointestinal: Negative for nausea, vomiting, diarrhea, abdominal pain, blood in stool, hematemesis, jaundice and rectal bleeding. . ; ; Genitourinary: Negative for dysuria, flank pain and hematuria. ; ; Musculoskeletal: +left knee pain. Negative for back pain and neck pain. Negative for swelling and trauma.; ; Skin: Negative for pruritus, rash, abrasions, blisters, bruising and skin lesion.; ; Neuro: Negative for headache, lightheadedness and neck stiffness. Negative for weakness, altered level of consciousness , altered mental status, extremity weakness, paresthesias, involuntary movement, seizure and syncope.      Allergies  Penicillins  Home Medications   Prior to Admission medications   Medication Sig Start Date End Date Taking? Authorizing Provider  Cholecalciferol (VITAMIN D3) 5000 UNITS CAPS Take 5,000 Units by mouth every evening.     Historical Provider, MD  Coenzyme Q10 (CO Q 10) 100 MG CAPS Take 1 capsule by mouth daily.  Historical Provider, MD  ferrous fumarate (HEMOCYTE - 106 MG FE) 325 (106 FE) MG TABS tablet Take 1 tablet by mouth daily.    Historical Provider, MD  furosemide (LASIX) 40 MG tablet Take 40 mg by mouth every morning.     Historical Provider, MD  insulin aspart (NOVOLOG FLEXPEN) 100 UNIT/ML FlexPen Inject 17 Units into the skin 3 (three) times daily with meals.     Historical Provider, MD  Insulin Glargine (LANTUS SOLOSTAR) 100 UNIT/ML Solostar Pen Inject 100 Units into the skin daily at 10 pm.      Historical Provider, MD  lovastatin (MEVACOR) 20 MG tablet Take 20 mg by mouth daily at 6 PM.     Historical Provider, MD  niacin 500 MG CR capsule Take 500 mg by mouth daily.    Historical Provider, MD  oxyCODONE-acetaminophen (ROXICET) 5-325 MG per tablet Take 1-2 tablets by mouth every 6 (six) hours as needed for moderate pain or severe pain. Post-operatively 01/03/15   Alexis Frock, MD  senna-docusate (SENOKOT-S) 8.6-50 MG per tablet Take 1 tablet by mouth 2 (two) times daily. While taking pain meds to prevent constipation 01/03/15   Alexis Frock, MD  sitaGLIPtin-metformin (JANUMET) 50-500 MG per tablet Take 1 tablet by mouth daily after breakfast.    Historical Provider, MD  sulfamethoxazole-trimethoprim (BACTRIM DS,SEPTRA DS) 800-160 MG per tablet Take 1 tablet by mouth 2 (two) times daily. X 3 days. Begin day prior to next Urology appointment. 01/03/15   Alexis Frock, MD  tamsulosin (FLOMAX) 0.4 MG CAPS capsule Take 0.4 mg by mouth daily after breakfast.    Historical Provider, MD   BP 115/75 mmHg  Pulse 90  Temp(Src) 97.6 F (36.4 C) (Oral)  Resp 17  SpO2 100% Physical Exam  1030: Physical examination:  Nursing notes reviewed; Vital signs and O2 SAT reviewed;  Constitutional: Well developed, Well nourished, Well hydrated, In no acute distress; Head:  Normocephalic, atraumatic; Eyes: EOMI, PERRL, No scleral icterus; ENMT: Mouth and pharynx normal, Mucous membranes moist; Neck: Supple, Full range of motion, No lymphadenopathy; Cardiovascular: Regular rate and rhythm, No gallop; Respiratory: Breath sounds clear & equal bilaterally, No wheezes.  Speaking full sentences with ease, Normal respiratory effort/excursion; Chest: Nontender, Movement normal; Abdomen: Soft, Nontender, Nondistended, Normal bowel sounds; Genitourinary: No CVA tenderness; Extremities: Pulses normal, No deformity.  +FROM left knee, including able to lift extended LLE off stretcher, and extend left lower leg against  resistance.  No ligamentous laxity.  No patellar or quad tendon step-offs.  NMS intact left foot, strong pedal pp. +plantarflexion of left foot w/calf squeeze.  No palpable gap left Achilles's tendon.  No proximal fibular head tenderness.  No edema, erythema, warmth, ecchymosis or deformity.  +patella with mild tenderness, otherwise no specific area of point tenderness.  No edema, No calf tenderness, edema or asymmetry.; Neuro: AA&Ox3, Major CN grossly intact.  Speech clear. No gross focal motor or sensory deficits in extremities.; Skin: Color normal, Warm, Dry.   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  Delma Post, MD 03/12/2015    Dg Knee Complete 4 Views Left 03/23/2015   CLINICAL DATA:  Left knee pain and swelling.  No known injury.  EXAM: LEFT KNEE -  COMPLETE 4+ VIEW  COMPARISON:  None.  FINDINGS: There is no evidence of fracture, dislocation, or joint effusion. There is no evidence of arthropathy or other focal bone abnormality. Soft tissues are unremarkable.  Mild tricompartmental degenerative spurring is seen, without significant joint space narrowing. Enthesopathic changes are also seen involving the patella.  IMPRESSION: No acute findings.  Mild tricompartmental degenerative spurring, and enthesopathic changes involving patella.   Electronically Signed   By: Earle Gell M.D.   On: 03/23/2015 11:12    1130:  Will tx symptomatically, refer to Ortho MD. Pt already taking lovenox for chronic LLE DVT; encouraged to take his meds as prescribed. Dx and testing d/w pt.  Questions answered.  Verb understanding, agreeable to d/c home with outpt f/u.   Francine Graven, DO 03/26/15 2045

## 2015-03-23 NOTE — Discharge Instructions (Signed)
°Emergency Department Resource Guide °1) Find a Doctor and Pay Out of Pocket °Although you won't have to find out who is covered by your insurance plan, it is a good idea to ask around and get recommendations. You will then need to call the office and see if the doctor you have chosen will accept you as a new patient and what types of options they offer for patients who are self-pay. Some doctors offer discounts or will set up payment plans for their patients who do not have insurance, but you will need to ask so you aren't surprised when you get to your appointment. ° °2) Contact Your Local Health Department °Not all health departments have doctors that can see patients for sick visits, but many do, so it is worth a call to see if yours does. If you don't know where your local health department is, you can check in your phone book. The CDC also has a tool to help you locate your state's health department, and many state websites also have listings of all of their local health departments. ° °3) Find a Walk-in Clinic °If your illness is not likely to be very severe or complicated, you may want to try a walk in clinic. These are popping up all over the country in pharmacies, drugstores, and shopping centers. They're usually staffed by nurse practitioners or physician assistants that have been trained to treat common illnesses and complaints. They're usually fairly quick and inexpensive. However, if you have serious medical issues or chronic medical problems, these are probably not your best option. ° °No Primary Care Doctor: °- Call [facility] at  832-8000 - they can help you locate a primary care doctor that  accepts your insurance, provides certain services, etc. °- Physician Referral Service- 1-800-533-3463 ° °Chronic Pain Problems: °Organization         Address  Phone   Notes  °[facility]  (336) 297-2271 Patients need to be referred by their primary care doctor.  ° °Medication  Assistance: °Organization         Address  Phone   Notes  °[place] [facility] [address] °[place], [place] [address] (336) 641-8030 --Must be a resident of [place] °-- Must have NO insurance coverage whatsoever (no Medicaid/ Medicare, etc.) °-- The pt. MUST have a primary care doctor that directs their care regularly and follows them in the community °  °[facility]  (866) 331-1348   °[employer]  (888) 892-1162   ° °Agencies that provide inexpensive medical care: °Organization         Address  Phone   Notes  °[facility]  (336) 832-8035   °[facility]    (336) 832-7272   °[facility] [address] °[place], [place] [address] (336) 832-4777   °[facility] [address], °[place] (336) 271-4999   °[facility]    (336) 373-0678   °[facility]    (336) 272-1050   °[facility] ° [address], [place] Phone:  (336) 832-4444, Fax:  (336) 832-4440 Hours of Operation:  9 am - 6 pm, M-F.  Also accepts Medicaid/Medicare and self-pay.  °[facility] ° [address], [place] Phone: (336) 832-3150, Fax: (336) 832-3151. Hours of Operation:  8:30 am - 5:30 pm, M-F.  Also accepts Medicaid and self-pay.  °[facility] [place] [address], [place] Phone: (336) 878-6027   °[facility] [address], [place], [place] (336)723-1848, Ext. 123 Mondays & Thursdays: 7-9 AM.  First 15 patients are seen on a first come, first serve basis. °  ° °Medicaid-accepting [place] Providers: ° °Organization         Address  Phone   Notes  °[facility] [address], [place] (336) 641-2100 Also accepts self-pay patients.  °[facility] [address], [place] ° (336) 856-9996   °[facility] [address], [place]  (336) 288-8857   °[facility] [address] [place] [address], [place] (336) 299-7000   °Veita Bland [address], [place]  ° (336) 373-1557 Only accepts [place] Access Medicaid patients after they have their name applied to their card.  ° °Self-Pay (no insurance) in [place]: ° °Organization         Address  Phone   Notes  °Sickle Cell Patients, [facility] [address], [place] (336) 832-1970   °[facility] [address], [place] (336) 832-4400   ° [facility] ° [address], [place] (336) 992-4800   °[facility]/Dr. Osei-Bonsu ° [address] [place] [address], [place] or [address], [place] (336) 841-8500 Phone number for both [place] and [place] locations is the same.  °[facility] [address], [place] (336) 299-0000   °[facility] [address] [place] [address], [place] or [address] (336) 852-7530 °(336) 878-2260   °[facility] [address], [place] (336) 350-1642, phone; (336) 294-5005, fax Sees patients 1st and 3rd Saturday of every month.  Must not qualify for public or private insurance (i.e. Medicaid, Medicare, [place] Health Choice, Veterans' Benefits) • Household income should be no more than 200% of the poverty level •The clinic cannot treat you if you are pregnant or think you are pregnant • Sexually transmitted diseases are not treated at the clinic.  ° ° °Dental Care: °Organization         Address  Phone  Notes  °[place] [facility] [address], [place] (336) 641-6152 Accepts children up to age 21 who are enrolled in Medicaid or [place] Health Choice; pregnant women with a Medicaid card; and children who have applied for Medicaid or [place] Health Choice, but were declined, whose parents can pay a reduced fee at time of service.  °[place]  [facility] [place]  [address], [place] (336) 641-7733 Accepts children up to age 21 who are enrolled in Medicaid or [place] Health Choice; pregnant women with a Medicaid card; and children who have applied for Medicaid or [place] Health Choice, but were declined, whose parents can pay a reduced fee at time of service.  °[facility] ° [address],  (336) 641-4533 Patients are seen by appointment only. Walk-ins are not accepted. [facility] will see patients 18 years of age and older. °Monday - Tuesday (8am-5pm) °Most Wednesdays (8:30-5pm) °$30 per visit, cash only  °[facility] ° [address], [place] (336) 641-4533 Patients are seen by appointment only. Walk-ins are not accepted. [facility] will see patients 18 years of age and older. °One   Wednesday Evening (Monthly: Volunteer Based).  $30 per visit, cash only  °[facility]  (919) 537-3737 for adults; Children under age 4, call Graduate Pediatric Dentistry at ([phone]) [phone]. Children aged 4-14, please call (919) [phone] to request a pediatric application. ° Dental services are provided in all areas of dental care including fillings, crowns and bridges, complete and partial dentures, implants, gum treatment, root canals, and extractions. Preventive care is also provided. Treatment is provided to both adults and children. °Patients are selected via a lottery and there is often a waiting list. °  °[facility] [address], °[place] ° (336) 763-8833 [email or web address] °  °[facility] [address], [place], [place] (336)723-1848, Ext. 123 Second and Fourth Thursday of each month, opens at 6:30 AM; Clinic ends at 9 AM.  Patients are seen on a first-come first-served basis, and a limited number are seen during each clinic.  ° °[facility] ° [address], [place], [place] (336) 723-7904    Eligibility Requirements °You must have lived in [place], [place], or [place] counties for at least the last three months. °  You cannot be eligible for state or federal sponsored healthcare insurance, including Veterans Administration, Medicaid, or Medicare. °  You generally cannot be eligible for healthcare insurance through your employer.  °  How to apply: °Eligibility screenings are held every Tuesday and Wednesday afternoon from 1:00 pm until 4:00 pm. You do not need an appointment for the interview!  °[facility] [address] [place] [address], [place], [place] 336-631-2330   °[facility]  336-342-8273   °[facility]  336-703-3100   °[facility]  336-570-6415   ° °Behavioral Health Resources in the Community: °Intensive Outpatient Programs °Organization         Address  Phone  Notes  °[facility] [address], [place], [place] 336-878-6098   °[facility] [address], [place], [place] 336-832-9800   °[facility] [address], [place], [place] ° 336-882-2125   °[facility] [address],  °[place], [place] 1-800-853-5163 or 336-641-4981   °Substance Abuse Resources °Organization         Address  Phone  Notes  °[facility]  336-882-2125   °[facility]  336-784-9470   °[facility]  336-285-9073   °[facility]  336-845-3988   °Residential & Outpatient Substance Abuse Program  1-800-659-3381   °Psychological Services °Organization         Address  Phone  Notes  °[facility]  336- 832-9600   °[facility]  336- 378-7881   °[facility] [address], [place] 1-800-853-5163 or 336-641-4981   ° °Mobile Crisis Teams °Organization         Address  Phone  Notes  °[facility], Mobile Crisis Care Unit  1-877-626-1772   °Assertive °[facility] ° [address]  [place], [place] 336-834-9664   °Sharon DeEsch [address] °[place] 336-554-5454   ° °Self-Help/Support Groups °Organization         Address  Phone             Notes  °[facility]  - variety of support groups  336- 373-1402 Call for more information  °Narcotics Anonymous (NA), [facility] [address], °[place]  2 meetings at this location  ° °  Residential Treatment Programs Organization         Address  Phone  Notes  [facility] [address]    [place]  1-772 104 5949   [facility]  [address], [place] [address], [place], [place]   [place], [place] (812)[phone] Admissions: 8am-3pm M-F  [facility] [place] [address]    [place], [place] 852-778-2423   [facility] [address] [place], [place], [place]   [facility] [address],  [place], [place]   [facility] Intensive Outpatient [address], [place], [place]   [facility] ([address] [place]  [place], [place] 1-(519) 465-2000 or (614) 545-2574   [facility] [address] [place], [place] Accepts Medicaid  [facility] [place] [address],  [place] 1-320-565-5131 Substance Abuse/Addiction Treatment   [facility]         Address  Phone  Notes  [facility]  (662)139-7883   Domenic Schwab, PhD [address] [place], [place]   (212)293-1512 or (707)210-0065   [facility] [place], [place] 810-608-1686   [facility] [address], [place], [place] [phone] Insurance/Medicaid/sponsorship through [facility] [address]                                    [place], [place] [phone] [place] [address], [place] [phone]    Dr. Adele Schilder  [phone]   [facility] Dept. 1) [address], [place] 2) [place] 3)  [address], [place] [phone]  [phone]   [place] [phone] or [phone] (After Hours)      Take the prescription as directed. Continue to take your lovenox as previously directed. Apply moist heat or ice to the area(s) of discomfort, for 15 minutes at a time, several times per day for the next few days.  Do not fall asleep on a heating or ice pack. Use the knee sleeve and walk with the crutches until you are seen in follow up.  Call your regular medical doctor today to schedule a follow up appointment in the next 3 days. Call the Orthopedic doctor today to schedule a follow up appointment within the next week.  Return to the Emergency Department immediately if worsening.

## 2015-03-23 NOTE — ED Notes (Signed)
[facility]- pt coming from PCP office for possible DVT. Hx of DVT. Pain and swelling in left knee since Tuesday and has been taking lovenox.

## 2015-03-26 ENCOUNTER — Encounter: Payer: Medicare Other | Attending: Specialist | Admitting: Dietician

## 2015-03-26 VITALS — Ht 68.0 in | Wt 283.0 lb

## 2015-03-26 DIAGNOSIS — IMO0002 Reserved for concepts with insufficient information to code with codable children: Secondary | ICD-10-CM

## 2015-03-26 DIAGNOSIS — E118 Type 2 diabetes mellitus with unspecified complications: Secondary | ICD-10-CM | POA: Insufficient documentation

## 2015-03-26 DIAGNOSIS — Z713 Dietary counseling and surveillance: Secondary | ICD-10-CM | POA: Diagnosis not present

## 2015-03-26 DIAGNOSIS — E1165 Type 2 diabetes mellitus with hyperglycemia: Secondary | ICD-10-CM

## 2015-03-26 DIAGNOSIS — Z794 Long term (current) use of insulin: Secondary | ICD-10-CM | POA: Insufficient documentation

## 2015-03-26 DIAGNOSIS — Z6841 Body Mass Index (BMI) 40.0 and over, adult: Secondary | ICD-10-CM | POA: Diagnosis not present

## 2015-03-26 NOTE — Progress Notes (Signed)
  Medical Nutrition Therapy:  Appt start time: 1115 end time:  1215.  Assessment:  Primary concerns today: Patient is here alone.  She wants to learn how to better control his blood sugar and how to eat.  He has lost from 294 lbs one month ago to 283 lbs.  Patient states that last HgbA1C was 8.9% March 2016.  He checks his blood sugar twice a day.  Before breakfast:  127-154, and After dinner:  106-160.  Overall trend has decreased since beginning diet changes. Patient reports being diagnosed with DM in 2012.  Patient lives with mother, father, and 2 brothers.  Patient is unemployed.  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  Preferred Learning Style:   No preference indicated   Learning Readiness:   Ready  Change in progress   MEDICATIONS: see list to include novolog flexpen, lantus solostar, janumet   DIETARY INTAKE: 24-hr recall:  B ( AM): orange and water or breakfast meat and 2 eggs orange and water  Snk ( AM): berries  L ( PM): meat (chicken) or tuna  Snk ( PM): fruit D ( PM): baked chicken, stuffing, potato salad, cabbage Snk ( PM): none Beverages: water, rare coffee, rare iced tea (gave up soda, and juice)  Usual physical activity: knee "went out" 1 week ago and currently not able to exercise.  Patient had been active, cleaning house, walking prior to that.  Estimated energy needs: 1600 calories 180 g carbohydrates 120 g protein 44 g fat  Progress Towards Goal(s):  In progress.   Nutritional Diagnosis:  NB-1.1 Food and nutrition-related knowledge deficit As related to balance of carbohydrate, protein, and fat.  As evidenced by diet hx.    Intervention:  Nutrition counseling and diabetes education initiated. Discussed Carb Counting by food group as method of portion control, reading food labels, and benefits of increased activity. Also discussed basic physiology of Diabetes, target BG ranges pre and post meals, and A1c.  . Plan: Be aware of the portion size.  (ice cream,  applesauce-keep to only 1/2 cup or choose a whole apple) Balance the carbohydrates throughout the day. Aim for 3 carbohydrate servings (45 grams) with each meal Aim for 0-1 carbohydrate servings (0-15 grams) for each snack if hungry Reduce your meat portion by half, take the skin off the chicken and buy lean meat. Have more non starchy vegetables. Use little fat. Do exercise as you can tolerate.  (See armchair exercises if unable to walk)  Teaching Method Utilized:  Visual Auditory Hands on  Handouts given during visit include:  My plate  Meal plan card  Snack list  Label reading  Armchair exercies  Barriers to learning/adherence to lifestyle change: none  Demonstrated degree of understanding via:  Teach Back   Monitoring/Evaluation:  Dietary intake, exercise, label reading, and body weight in 1 month(s).

## 2015-03-26 NOTE — Patient Instructions (Addendum)
Plan: Be aware of the portion size.  (ice cream, applesauce-keep to only 1/2 cup or choose a whole apple) Balance the carbohydrates throughout the day. Aim for 3 carbohydrate servings (45 grams) with each meal Aim for 0-1 carbohydrate servings (0-15 grams) for each snack if hungry Reduce your meat portion by half, take the skin off the chicken and buy lean meat. Have more non starchy vegetables. Use little fat. Do exercise as you can tolerate.  (See armchair exercises if unable to walk)

## 2015-05-24 ENCOUNTER — Encounter (HOSPITAL_COMMUNITY): Payer: Self-pay | Admitting: Emergency Medicine

## 2015-05-24 ENCOUNTER — Emergency Department (HOSPITAL_COMMUNITY)
Admission: EM | Admit: 2015-05-24 | Discharge: 2015-05-24 | Disposition: A | Discharge status: Home / Self Care | Payer: Medicare Other | Attending: Emergency Medicine | Admitting: Emergency Medicine

## 2015-05-24 DIAGNOSIS — Z880 Allergy status to penicillin: Secondary | ICD-10-CM | POA: Insufficient documentation

## 2015-05-24 DIAGNOSIS — I10 Essential (primary) hypertension: Secondary | ICD-10-CM | POA: Diagnosis not present

## 2015-05-24 DIAGNOSIS — M25462 Effusion, left knee: Secondary | ICD-10-CM | POA: Diagnosis not present

## 2015-05-24 DIAGNOSIS — E669 Obesity, unspecified: Secondary | ICD-10-CM | POA: Diagnosis not present

## 2015-05-24 DIAGNOSIS — E119 Type 2 diabetes mellitus without complications: Secondary | ICD-10-CM | POA: Diagnosis not present

## 2015-05-24 DIAGNOSIS — Z86711 Personal history of pulmonary embolism: Secondary | ICD-10-CM | POA: Insufficient documentation

## 2015-05-24 DIAGNOSIS — Z794 Long term (current) use of insulin: Secondary | ICD-10-CM | POA: Diagnosis not present

## 2015-05-24 DIAGNOSIS — M25562 Pain in left knee: Secondary | ICD-10-CM | POA: Diagnosis not present

## 2015-05-24 DIAGNOSIS — D649 Anemia, unspecified: Secondary | ICD-10-CM | POA: Insufficient documentation

## 2015-05-24 DIAGNOSIS — Z792 Long term (current) use of antibiotics: Secondary | ICD-10-CM | POA: Insufficient documentation

## 2015-05-24 DIAGNOSIS — Z86718 Personal history of other venous thrombosis and embolism: Secondary | ICD-10-CM | POA: Insufficient documentation

## 2015-05-24 DIAGNOSIS — Z79899 Other long term (current) drug therapy: Secondary | ICD-10-CM | POA: Diagnosis not present

## 2015-05-24 NOTE — ED Provider Notes (Signed)
CSN: [id number]     Arrival date & time 05/24/15  0802 History   First MD Initiated Contact with Patient 05/24/15 [phone]     Chief Complaint  Patient presents with  . Knee Pain     (Consider location/radiation/quality/duration/timing/severity/associated sxs/prior Treatment) Patient is a 49 y.o. male presenting with knee pain. The history is provided by the patient. No language interpreter was used.  Knee Pain Location:  Knee Time since incident:  2 days Injury: no   Knee location:  L knee Pain details:    Quality:  Aching   Radiates to:  Does not radiate   Severity:  Moderate   Onset quality:  Gradual   Duration:  2 days   Timing:  Constant   Progression:  Worsening Chronicity:  New Dislocation: no   Foreign body present:  No foreign bodies Relieved by:  Nothing Worsened by:  Nothing tried Ineffective treatments:  None tried Associated symptoms: decreased ROM     Past Medical History  Diagnosis Date  . Hypertension   . Deep vein thrombosis 07/2013, 10/2014, 03/16/2015    LLE (studies at [facility])  . Obesity   . Diabetes mellitus   . Peripheral vascular disease     6 yrs ago, DVT in Lt knee/ groin  . Anemia   . Shortness of breath     with exertion   . Pulmonary embolism     hx of x 3 per patient; most recent 02/2014   Past Surgical History  Procedure Laterality Date  . Tonsillectomy    . Ivc filter   07/2014   . Cystoscopy with retrograde pyelogram, ureteroscopy and stent placement Bilateral 01/03/2015    Procedure: CYSTOSCOPY WITH RETROGRADE PYELOGRAM, RIGHT URETEROSCOPY AND RIGHT STENT PLACEMENT;  Surgeon: Alexis Frock, MD;  Location: [facility];  Service: Urology;  Laterality: Bilateral;  . Holmium laser application Right 7/86/7672    Procedure: HOLMIUM LASER APPLICATION;  Surgeon: Alexis Frock, MD;  Location: [facility];  Service: Urology;  Laterality: Right;   History reviewed. No pertinent family history. History  Substance Use Topics  . Smoking status: Never  Smoker   . Smokeless tobacco: Never Used  . Alcohol Use: Yes     Comment: rare    Review of Systems  Musculoskeletal: Positive for joint swelling.  All other systems reviewed and are negative.     Allergies  Penicillins  Home Medications   Prior to Admission medications   Medication Sig Start Date End Date Taking? Authorizing Provider  Cholecalciferol (VITAMIN D3) 5000 UNITS CAPS Take 5,000 Units by mouth every evening.     Historical Provider, MD  Coenzyme Q10 (CO Q 10) 100 MG CAPS Take 1 capsule by mouth daily.    Historical Provider, MD  ferrous fumarate (HEMOCYTE - 106 MG FE) 325 (106 FE) MG TABS tablet Take 1 tablet by mouth daily.    Historical Provider, MD  furosemide (LASIX) 40 MG tablet Take 40 mg by mouth every morning.     Historical Provider, MD  HYDROcodone-acetaminophen (NORCO/VICODIN) 5-325 MG per tablet 1 or 2 tabs PO q6 hours prn pain 03/23/15   Francine Graven, DO  insulin aspart (NOVOLOG FLEXPEN) 100 UNIT/ML FlexPen Inject 17 Units into the skin 3 (three) times daily with meals.     Historical Provider, MD  Insulin Glargine (LANTUS SOLOSTAR) 100 UNIT/ML Solostar Pen Inject 100 Units into the skin daily at 10 pm.     Historical Provider, MD  lisinopril (PRINIVIL,ZESTRIL) 2.5 MG tablet Take 2.5  mg by mouth daily.    Historical Provider, MD  lovastatin (MEVACOR) 20 MG tablet Take 20 mg by mouth daily at 6 PM.     Historical Provider, MD  niacin 500 MG CR capsule Take 500 mg by mouth daily.    Historical Provider, MD  oxyCODONE-acetaminophen (ROXICET) 5-325 MG per tablet Take 1-2 tablets by mouth every 6 (six) hours as needed for moderate pain or severe pain. Post-operatively Patient not taking: Reported on 03/26/2015 01/03/15   Alexis Frock, MD  senna-docusate (SENOKOT-S) 8.6-50 MG per tablet Take 1 tablet by mouth 2 (two) times daily. While taking pain meds to prevent constipation Patient not taking: Reported on 03/26/2015 01/03/15   Alexis Frock, MD   sitaGLIPtin-metformin (JANUMET) 50-500 MG per tablet Take 1 tablet by mouth daily after breakfast.    Historical Provider, MD  sulfamethoxazole-trimethoprim (BACTRIM DS,SEPTRA DS) 800-160 MG per tablet Take 1 tablet by mouth 2 (two) times daily. X 3 days. Begin day prior to next Urology appointment. Patient not taking: Reported on 03/26/2015 01/03/15   Alexis Frock, MD  tamsulosin (FLOMAX) 0.4 MG CAPS capsule Take 0.4 mg by mouth daily after breakfast.    Historical Provider, MD   BP 120/70 mmHg  Pulse 88  Temp(Src) 97.9 F (36.6 C) (Oral)  Resp 18  SpO2 100% Physical Exam  Musculoskeletal: He exhibits tenderness.  Swollen tender knee, no erythema from   Neurological: He is alert.  Skin: Skin is warm.  Psychiatric: He has a normal mood and affect.  Vitals reviewed.   ED Course  Procedures (including critical care time) Labs Review Labs Reviewed - No data to display  Imaging Review No results found.   EKG Interpretation None      MDM   Final diagnoses:  Knee pain, left    Continue medications Wear knee brae See the Orthopaedist for recheck avs    Fransico Meadow, PA-C 05/24/15 [place], PA-C 05/24/15 [place], DO 06/01/15 [phone]

## 2015-05-24 NOTE — ED Notes (Signed)
Pt presents to ED for eval of left knee pain that began Tuesday (2 days ago) - reports taking a Vicodin last night and naproxen this morning without relief. VSS.

## 2015-05-24 NOTE — Discharge Instructions (Signed)
Cryotherapy °Cryotherapy means treatment with cold. Ice or gel packs can be used to reduce both pain and swelling. Ice is the most helpful within the first 24 to 48 hours after an injury or flare-up from overusing a muscle or joint. Sprains, strains, spasms, burning pain, shooting pain, and aches can all be eased with ice. Ice can also be used when recovering from surgery. Ice is effective, has very few side effects, and is safe for most people to use. °PRECAUTIONS  °Ice is not a safe treatment option for people with: °· Raynaud phenomenon. This is a condition affecting small blood vessels in the extremities. Exposure to cold may cause your problems to return. °· Cold hypersensitivity. There are many forms of cold hypersensitivity, including: °¨ Cold urticaria. Red, itchy hives appear on the skin when the tissues begin to warm after being iced. °¨ Cold erythema. This is a red, itchy rash caused by exposure to cold. °¨ Cold hemoglobinuria. Red blood cells break down when the tissues begin to warm after being iced. The hemoglobin that carry oxygen are passed into the urine because they cannot combine with blood proteins fast enough. °· Numbness or altered sensitivity in the area being iced. °If you have any of the following conditions, do not use ice until you have discussed cryotherapy with your caregiver: °· Heart conditions, such as arrhythmia, angina, or chronic heart disease. °· High blood pressure. °· Healing wounds or open skin in the area being iced. °· Current infections. °· Rheumatoid arthritis. °· Poor circulation. °· Diabetes. °Ice slows the blood flow in the region it is applied. This is beneficial when trying to stop inflamed tissues from spreading irritating chemicals to surrounding tissues. However, if you expose your skin to cold temperatures for too long or without the proper protection, you can damage your skin or nerves. Watch for signs of skin damage due to cold. °HOME CARE INSTRUCTIONS °Follow  these tips to use ice and cold packs safely. °· Place a dry or damp towel between the ice and skin. A damp towel will cool the skin more quickly, so you may need to shorten the time that the ice is used. °· For a more rapid response, add gentle compression to the ice. °· Ice for no more than 10 to 20 minutes at a time. The bonier the area you are icing, the less time it will take to get the benefits of ice. °· Check your skin after 5 minutes to make sure there are no signs of a poor response to cold or skin damage. °· Rest 20 minutes or more between uses. °· Once your skin is numb, you can end your treatment. You can test numbness by very lightly touching your skin. The touch should be so light that you do not see the skin dimple from the pressure of your fingertip. When using ice, most people will feel these normal sensations in this order: cold, burning, aching, and numbness. °· Do not use ice on someone who cannot communicate their responses to pain, such as small children or people with dementia. °HOW TO MAKE AN ICE PACK °Ice packs are the most common way to use ice therapy. Other methods include ice massage, ice baths, and cryosprays. Muscle creams that cause a cold, tingly feeling do not offer the same benefits that ice offers and should not be used as a substitute unless recommended by your caregiver. °To make an ice pack, do one of the following: °· Place crushed ice or a   bag of frozen vegetables in a sealable plastic bag. Squeeze out the excess air. Place this bag inside another plastic bag. Slide the bag into a pillowcase or place a damp towel between your skin and the bag. °· Mix 3 parts water with 1 part rubbing alcohol. Freeze the mixture in a sealable plastic bag. When you remove the mixture from the freezer, it will be slushy. Squeeze out the excess air. Place this bag inside another plastic bag. Slide the bag into a pillowcase or place a damp towel between your skin and the bag. °SEEK MEDICAL CARE  IF: °· You develop white spots on your skin. This may give the skin a blotchy (mottled) appearance. °· Your skin turns blue or pale. °· Your skin becomes waxy or hard. °· Your swelling gets worse. °MAKE SURE YOU:  °· Understand these instructions. °· Will watch your condition. °· Will get help right away if you are not doing well or get worse. °Document Released: 07/21/2011 Document Revised: 04/10/2014 Document Reviewed: 07/21/2011 °ExitCare® Patient Information ©2015 [employer]. This information is not intended to replace advice given to you by your health care provider. Make sure you discuss any questions you have with your health care provider. ° °

## 2015-06-13 ENCOUNTER — Other Ambulatory Visit: Payer: Self-pay | Admitting: Orthopaedic Surgery

## 2015-06-13 DIAGNOSIS — M25562 Pain in left knee: Secondary | ICD-10-CM

## 2015-06-14 ENCOUNTER — Ambulatory Visit
Admission: RE | Admit: 2015-06-14 | Discharge: 2015-06-14 | Disposition: A | Discharge status: Home / Self Care | Payer: Medicare Other | Source: Ambulatory Visit | Attending: Orthopaedic Surgery | Admitting: Orthopaedic Surgery

## 2015-06-14 DIAGNOSIS — M25562 Pain in left knee: Secondary | ICD-10-CM

## 2015-08-09 DIAGNOSIS — R0789 Other chest pain: Secondary | ICD-10-CM

## 2015-08-09 HISTORY — DX: Other chest pain: R07.89

## 2015-08-09 NOTE — H&P (Signed)
OFFICE VISIT NOTES COPIED TO EPIC FOR DOCUMENTATION   Travis Rollins 07/30/2015 8:43 AM Location: [facility] Patient #: [phone] DOB: 10-17-1966 Single / Language: Travis Rollins / Race: Black or African American Male  History of Present Illness Travis Rollins; 07/30/2015 5:23 PM) The patient is a 49 year old male who presents for a Follow-up for Chest pain. Travis Rollins is 49 years old African-American male. He is complaining of feeling heaviness in the substernal region on walking fast for about 5 minutes. It is associated with shortness of breath but no history of diaphoresis or nausea. No radiation to the arms, neck or back of the chest. He has had these symptoms for over a year. Once, he had needles sticking type pain in the substernal region which was not related to exertion.  Patient has shortness of breath on walking. No history of dyspnea at rest and no orthopnea or PND. He has chronic swelling on the legs, has been wearing support hose. 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 Patient has not been on anticoagulation since then and has not had any recurrence of DVT or PE. No history of leg claudication.  Patient has diabetes mellitus type 2, hypertension, hypercholesterolemia and morbid obesity.  No history of thyroid problems. No history of TIA or CVA.    Problem List/Past Medical (Travis Rollins; 07/30/2015 2:37 PM) Chest pain (R07.9) Shortness of breath on exertion (R06.02) Essential hypertension, benign (I10) BMI 40.0-44.9, adult (Z68.41) Morbid obesity with BMI of 40.0-44.9, adult (E66.01) History of DVT of lower extremity (U04.540) Pulmonary emboli (I26.99) Hypercholesteremia (E78.0) Diabetes type 2, controlled (E11.9) Diabetic neuropathy (E11.40) Tendonitis (M77.9)  Allergies (Travis Rollins; 07/30/2015 2:37 PM) Penicillins  Hives.  Family History (Travis Rollins; 07/30/2015 2:37 PM) Mother In stable health. no heart attacks, and strokes; no cardiovascular conditions Father In poor health. two mild strokes at age 75; no heart attacks, hyperlipidemia, no other cardiovascular conditions Sister 1 In good health. 4 yrs younger; no cardiovascular conditions Brother 1 In good health. 3 yrs younger; no cardiovascular conditions Brother 2 7 yrs younger; no cardiovascular conditions  Social History (Travis Rollins; 07/30/2015 2:46 PM) Marital status Single. Living Situation Lives with relatives. Current tobacco use Never smoker. Non Drinker/No Alcohol Use Number of Children 0.  Past Surgical History (Travis Rollins; 07/30/2015 2:37 PM) [phone] Adenoidectomy1976 Myringotomy with Tube Placement1976 tubes were removed 1981 IVC Filter Placement2015 Lithotripsy02/2016 stent removal done in 02/2015  Medication History (Travis Rollins; 07/30/2015 2:52 PM) Nitrostat (0.4MG  Tab Sublingual, 1 (one) Tab Sublingual Sublingual every 5 minutes as needed for chest pain., Taken starting 07/09/2015) Active. Metoprolol Succinate ER (50MG  Tablet ER 24HR, 1 (one) Tablet ER 24HR Oral daily, Taken starting 07/09/2015) Active. Aspirin EC Low Dose (81MG  Tablet DR, 1 (one) Tablet DR Oral daily, Taken starting 07/09/2015) Active. Tamsulosin HCl (0.4MG  Capsule, 1 Oral daily) Active. Allopurinol (100MG  Tablet, 1 Oral daily) Active. Lisinopril (2.5MG  Tablet, 1 Oral daily) Active. Furosemide (40MG  Tablet, 1 Oral daily) Active. Lovastatin (20MG  Tablet, 1 Oral daily) Active. Janumet (50-500MG  Tablet, 1 Oral two times daily) Active. Gabapentin (300MG  Capsule, 1 Oral three times daily) Active. Naproxen (500MG  Tablet, 1 Oral two times daily) Active. Hydrocodone-Acetaminophen (5-325MG  Tablet, 1 Oral two times daily) Active. NovoLOG Mix 70/30 FlexPen ((70-30) 100UNIT/ML Susp Pen-inj, inject 10 units Subcutaneous  before each meal) Active. Lantus SoloStar (100UNIT/ML Soln Pen-inj, inject 100 units Subcutaneous at bedtime) Active.  Potassium Citrate ER (10 MEQ(1080 MG) Tablet ER, 1 Oral daily) Active. Iron (325 (65 Fe)MG Tablet, 1 Oral daily) Active. Niacin (500MG  Tablet, 1 Oral daily) Active. CoQ10 (100MG  Capsule, 1 Oral daily) Active. Vitamin D3 (2000UNIT Capsule, 1 Oral daily) Active. Medications Reconciled  Diagnostic Studies History (Travis Rollins; 2015/08/15 2:38 PM) Echocardiogram08/01/2015 1. Left ventricle cavity is normal in size. Moderate concentric hypertrophy of the left ventricle. Normal global wall motion. Visual EF is 60-65%. Calculated EF 57%. 2. Trace mitral regurgitation. 3. Trace tricuspid regurgitation. Nuclear stress test08/11/2015 1. The resting electrocardiogram demonstrated normal sinus rhythm, normal resting conduction, no resting arrhythmias and normal rest repolarization. Stress EKG is non-diagnostic for ischemia as it a pharmacologic stress using Lexiscan. Stress symptoms included dyspnea. 2. The perfusion imaging study demonstrates the left ventricle to be dilated with left ventricular end-diastolic volume off 357 mL. Left ventricle is dilated both at rest and stress images. The t.i.d. index is 0.89. There is a moderate sized inferior wall scar extending from the base towards the apex including the apex. There is no significant peri-infarct ischemia. Left ventricular systolic function calculated by QGS was 37% with global hypokinesis and inferior akinesis. The raw images reveal mild diaphragmatic attenuation. In a patient with no known coronary artery disease, h/o diabetes mellitus, balanced ischemia cannot be excluded. This is at least an intermediate risk and, clinical correlation is recommended. Renal Doppler2016 Lower Extremity Dopplers05/2016   Review of Systems Travis Hick, Rollins; 08-15-15 5:25 PM)  Note: GENERAL- Not feeling tired or fatigue, No fever,  chills. Has morbid obesity. No recent weight change. CARDIO VASCULAR- has chest pain and shortness of breath, No orthopnea or PND. No palpitation, dizziness, fainting. Has hypertension and h/o high cholesterol. Has chronic swelling on legs, h/o DVT and PE. No claudication in legs, No cramps. PULMONARY- No cough, phlegm, wheezing, not feeling congested in chest. GASTROINTESTINAL- No abdominal pain, nausea, vomiting or diarrhea. No dark tarry stools. Normal appetite. No heartburn. No jaundice. ENDOCRINE- No Thyroid problem, No feeling of excessive heat or cold, No polydipsia or polyuria. Has Diabetes. NEUROLOGICAL- No focal motor or sensory symptoms, Good coordination. No seizures. MUSCULOSKELETAL- No generalized myalgias or muscle weakness. No joint swelling SKIN- No skin rash, No pruritus HEMATOLOGY- No anemia, petechiae, excessive bruising, epistaxis, GI bleed or any abnormal bleeding.  Vitals (Travis Rollins; 08-15-2015 2:54 PM) Aug 15, 2015 2:39 PM Weight: 289.06 lb Height: 68in Body Surface Area: 2.39 m Body Mass Index: 43.95 kg/m  Pulse: 76 (Regular)  P.OX: 98% (Room air) BP: 106/68 (Sitting, Left Arm, Standard)     Physical Exam Travis Rollins; 08/15/2015 5:22 PM) The physical exam findings are as follows: Note:GENERAL APPEARANCE- Alert, Oriented. morbidly obese. HEENT- Unremarkable, fundi were not examined. NECK- No JVD. Carotid pulses are 2+, No bruits audible. No thyromegaly. No lymphadenopathy. HEART- Auscultation- Normal S1, S2. No gallops or murmurs audible. CHEST- Normal shape. Normal percussion. Auscultation- Normal breath sounds, No crepitations. No wheezing. ABDOMEN- Very obese. Palpation- Soft, Nontender. No hepatosplenomegaly. No masses felt. Auscultation- No bruits audible. Examination is limited due to morbid obesity. EXTREMITIES- No Clubbing or Cyanosis. Chronic edema of legs or feet. There is pigmentation of both the legs. No calf tenderness and  no cords are palpable. Homans's sign is negative. PERIPHERAL PULSES- Both femoral pulses- 2-3+, No bruits audible. Peripheral pulses could not be felt in the feet due to edema.  Assessment & Plan Travis Rollins; 2015-08-15 5:20 PM) Chest pain (R07.9) Story: Nuclear stress test 07/20/2015 1. The  resting electrocardiogram demonstrated normal sinus rhythm, normal resting conduction, no resting arrhythmias and normal rest repolarization. Stress EKG is non-diagnostic for ischemia as it a pharmacologic stress using Lexiscan. Stress symptoms included dyspnea. 2. The perfusion imaging study demonstrates the left ventricle to be dilated with left ventricular end-diastolic volume off 128 mL. Left ventricle is dilated both at rest and stress images. The t.i.d. index is 0.89. There is a moderate sized inferior wall scar extending from the base towards the apex including the apex. There is no significant peri-infarct ischemia. Left ventricular systolic function calculated by QGS was 37% with global hypokinesis and inferior akinesis. The raw images reveal mild diaphragmatic attenuation. In a patient with no known coronary artery disease, h/o diabetes mellitus, balanced ischemia cannot be excluded. This is at least an intermediate risk and, clinical correlation is recommended. Shortness of breath on exertion (R06.02) Essential hypertension, benign (I10) Story: Echocardiogram 07/10/2015 1. Left ventricle cavity is normal in size. Moderate concentric hypertrophy of the left ventricle. Normal global wall motion. Visual EF is 60-65%. Calculated EF 57%. 2. Trace mitral regurgitation. 3. Trace tricuspid regurgitation. Morbid obesity with BMI of 40.0-44.9, adult (E66.01) Hypercholesteremia (E78.0) Current Plans Discontinued Lovastatin 20MG . Started Atorvastatin Calcium 40MG , 1 (one) Tablet daily, #30, 30 days starting 07/30/2015, Ref. x4. Note:Results of echocardiogram and Lexiscan Myoview scans were explained to the  patient. In view of his symptoms of chest pain and stress nuclear scan findings, I have recommended cardiac catheterization. We discussed regarding risks, benefits, alternatives to this including continued medical therapy. Patient wants to proceed. Understands <1-2% risk of death, stroke, MI, urgent CABG, bleeding, infection, renal failure but not limited to these. Pt. verbalized understanding, has been scheduled for cardiac catheterization, and possible angioplasty/stent implant by Dr. Einar Gip.  Patient was advised to hold Janumet on the day before catheterization and take only half the dose of insulin on the night before. He should not take any insulin or Janumet on the day of cardiac cath.  His blood pressure is well controlled with therapy. He was advised to continue all the other present medications, except that I have changed lovastatin to atorvastatin.  Primary prevention was again discussed at length. He was advised to follow strict ADA, low-salt, low-cholesterol diet. Patient was also given dietary instructions to reduce calories to lose weight.  I will see him in follow-up after cardiac cath.  CC: Dr. Lillia Corporal.  Signed electronically by Travis Hick, Rollins (07/30/2015 5:26 PM)

## 2015-08-10 ENCOUNTER — Ambulatory Visit (HOSPITAL_COMMUNITY)
Admission: RE | Admit: 2015-08-10 | Discharge: 2015-08-10 | Disposition: A | Discharge status: Home / Self Care | Payer: Medicare Other | Source: Ambulatory Visit | Attending: Cardiology | Admitting: Cardiology

## 2015-08-10 ENCOUNTER — Encounter (HOSPITAL_COMMUNITY)
Admission: RE | Disposition: A | Discharge status: Home / Self Care | Payer: Self-pay | Source: Ambulatory Visit | Attending: Cardiology

## 2015-08-10 ENCOUNTER — Encounter (HOSPITAL_COMMUNITY): Payer: Self-pay | Admitting: Cardiology

## 2015-08-10 DIAGNOSIS — I2584 Coronary atherosclerosis due to calcified coronary lesion: Secondary | ICD-10-CM | POA: Diagnosis not present

## 2015-08-10 DIAGNOSIS — Z8249 Family history of ischemic heart disease and other diseases of the circulatory system: Secondary | ICD-10-CM | POA: Insufficient documentation

## 2015-08-10 DIAGNOSIS — Z86718 Personal history of other venous thrombosis and embolism: Secondary | ICD-10-CM | POA: Diagnosis not present

## 2015-08-10 DIAGNOSIS — E782 Mixed hyperlipidemia: Secondary | ICD-10-CM | POA: Insufficient documentation

## 2015-08-10 DIAGNOSIS — E1140 Type 2 diabetes mellitus with diabetic neuropathy, unspecified: Secondary | ICD-10-CM | POA: Insufficient documentation

## 2015-08-10 DIAGNOSIS — I2510 Atherosclerotic heart disease of native coronary artery without angina pectoris: Secondary | ICD-10-CM | POA: Insufficient documentation

## 2015-08-10 DIAGNOSIS — R0789 Other chest pain: Secondary | ICD-10-CM

## 2015-08-10 DIAGNOSIS — I10 Essential (primary) hypertension: Secondary | ICD-10-CM | POA: Diagnosis not present

## 2015-08-10 HISTORY — PX: CARDIAC CATHETERIZATION: SHX172

## 2015-08-10 LAB — GLUCOSE, CAPILLARY: GLUCOSE-CAPILLARY: 106 mg/dL — AB (ref 65–99)

## 2015-08-10 SURGERY — LEFT HEART CATH AND CORONARY ANGIOGRAPHY

## 2015-08-10 MED ORDER — ONDANSETRON HCL 4 MG/2ML IJ SOLN
INTRAMUSCULAR | Status: AC
  Filled 2015-08-10: qty 2

## 2015-08-10 MED ORDER — SODIUM CHLORIDE 0.9 % WEIGHT BASED INFUSION
3.0000 mL/kg/h | INTRAVENOUS | Status: AC
  Administered 2015-08-10: 3 mL/kg/h via INTRAVENOUS

## 2015-08-10 MED ORDER — MIDAZOLAM HCL 2 MG/2ML IJ SOLN
INTRAMUSCULAR | Status: AC
  Filled 2015-08-10: qty 4

## 2015-08-10 MED ORDER — NITROGLYCERIN 1 MG/10 ML FOR IR/CATH LAB
INTRA_ARTERIAL | Status: AC
  Filled 2015-08-10: qty 10

## 2015-08-10 MED ORDER — SODIUM CHLORIDE 0.9 % IJ SOLN: 3.0000 mL | Freq: Two times a day (BID) | INTRAMUSCULAR | Status: DC

## 2015-08-10 MED ORDER — MIDAZOLAM HCL 2 MG/2ML IJ SOLN
INTRAMUSCULAR | Status: DC | PRN
  Administered 2015-08-10: 2 mg via INTRAVENOUS

## 2015-08-10 MED ORDER — ONDANSETRON HCL 4 MG/2ML IJ SOLN
INTRAMUSCULAR | Status: DC | PRN
  Administered 2015-08-10: 4 mg via INTRAVENOUS

## 2015-08-10 MED ORDER — SODIUM CHLORIDE 0.9 % IV SOLN: 250.0000 mL | INTRAVENOUS | Status: DC | PRN

## 2015-08-10 MED ORDER — ASPIRIN 81 MG PO CHEW
81.0000 mg | CHEWABLE_TABLET | ORAL | Status: AC
  Administered 2015-08-10: 81 mg via ORAL

## 2015-08-10 MED ORDER — RADIAL COCKTAIL (HEPARIN/VERAPAMIL/LIDOCAINE/NITRO)
Status: DC | PRN
Start: 2015-08-10 — End: 2015-08-10
  Administered 2015-08-10: 5 mL via INTRA_ARTERIAL

## 2015-08-10 MED ORDER — HYDROMORPHONE HCL 1 MG/ML IJ SOLN
INTRAMUSCULAR | Status: AC
  Filled 2015-08-10: qty 1

## 2015-08-10 MED ORDER — NITROGLYCERIN 1 MG/10 ML FOR IR/CATH LAB
INTRA_ARTERIAL | Status: DC | PRN
  Administered 2015-08-10: 08:00:00

## 2015-08-10 MED ORDER — HEPARIN SODIUM (PORCINE) 1000 UNIT/ML IJ SOLN
INTRAMUSCULAR | Status: DC | PRN
  Administered 2015-08-10: 7000 [IU] via INTRAVENOUS

## 2015-08-10 MED ORDER — ASPIRIN 81 MG PO CHEW
CHEWABLE_TABLET | ORAL | Status: AC
  Filled 2015-08-10: qty 1

## 2015-08-10 MED ORDER — LIDOCAINE HCL (PF) 1 % IJ SOLN
INTRAMUSCULAR | Status: AC
  Filled 2015-08-10: qty 30

## 2015-08-10 MED ORDER — SODIUM CHLORIDE 0.9 % WEIGHT BASED INFUSION
1.0000 mL/kg/h | INTRAVENOUS | Status: DC
  Administered 2015-08-10: 1 mL/kg/h via INTRAVENOUS

## 2015-08-10 MED ORDER — IOHEXOL 350 MG/ML SOLN
INTRAVENOUS | Status: DC | PRN
  Administered 2015-08-10: 80 mL via INTRAVENOUS

## 2015-08-10 MED ORDER — SITAGLIPTIN-METFORMIN HCL 50-500 MG PO TABS
1.0000 | ORAL_TABLET | Freq: Every day | ORAL | Status: DC
Start: 2015-08-12 — End: 2018-10-13

## 2015-08-10 MED ORDER — VERAPAMIL HCL 2.5 MG/ML IV SOLN
INTRAVENOUS | Status: AC
  Filled 2015-08-10: qty 2

## 2015-08-10 MED ORDER — SODIUM CHLORIDE 0.9 % WEIGHT BASED INFUSION: 3.0000 mL/kg/h | INTRAVENOUS | Status: AC

## 2015-08-10 MED ORDER — HYDROMORPHONE HCL 1 MG/ML IJ SOLN
INTRAMUSCULAR | Status: DC | PRN
  Administered 2015-08-10: 0.5 mg via INTRAVENOUS

## 2015-08-10 MED ORDER — SODIUM CHLORIDE 0.9 % IJ SOLN: 3.0000 mL | INTRAMUSCULAR | Status: DC | PRN

## 2015-08-10 SURGICAL SUPPLY — 8 items
CATH OPTITORQUE TIG 4.0 5F (CATHETERS) ×3 IMPLANT
DEVICE RAD COMP TR BAND LRG (VASCULAR PRODUCTS) ×3 IMPLANT
GLIDESHEATH SLEND A-KIT 6F 20G (SHEATH) ×3 IMPLANT
KIT HEART LEFT (KITS) ×3 IMPLANT
PACK CARDIAC CATHETERIZATION (CUSTOM PROCEDURE TRAY) ×3 IMPLANT
TRANSDUCER W/STOPCOCK (MISCELLANEOUS) ×3 IMPLANT
TUBING CIL FLEX 10 FLL-RA (TUBING) ×3 IMPLANT
WIRE SAFE-T 1.5MM-J .035X260CM (WIRE) ×3 IMPLANT

## 2015-08-10 NOTE — Interval H&P Note (Signed)
History and Physical Interval Note:  08/10/2015 7:38 AM  Travis Rollins  has presented today for surgery, with the diagnosis of cp/abnormal nuc  The various methods of treatment have been discussed with the patient and family. After consideration of risks, benefits and other options for treatment, the patient has consented to  Procedure(s): Left Heart Cath and Coronary Angiography (N/A) and possible PCI as a surgical intervention .  The patient's history has been reviewed, patient examined, no change in status, stable for surgery.  I have reviewed the patient's chart and labs.  Questions were answered to the patient's satisfaction.   Ischemic Symptoms? CCS II (Slight limitation of ordinary activity) Anti-ischemic Medical Therapy? Maximal Medical Therapy (2 or more classes of medications) Non-invasive Test Results? Intermediate-risk stress test findings: cardiac mortality 1-3%/year Prior CABG? No Previous CABG   Patient Information:   1-2V CAD, no prox LAD  A (7)  Indication: 17; Score: 7   Patient Information:   CTO of 1 vessel, no other CAD  U (5)  Indication: 27; Score: 5   Patient Information:   1V CAD with prox LAD  A (8)  Indication: 33; Score: 8   Patient Information:   2V-CAD with prox LAD  A (7)  Indication: 39; Score: 7   Patient Information:   3V-CAD without LMCA  A (8)  Indication: 45; Score: 8   Patient Information:   3V-CAD without LMCA With Abnormal LV systolic function  A (9)  Indication: 48; Score: 9   Patient Information:   LMCA-CAD  A (9)  Indication: 49; Score: 9   Patient Information:   2V-CAD with prox LAD PCI  A (7)  Indication: 62; Score: 7   Patient Information:   2V-CAD with prox LAD CABG  A (8)  Indication: 62; Score: 8   Patient Information:   3V-CAD without LMCA With Low CAD burden(i.e., 3 focal stenoses, low SYNTAX score) PCI  A (7)  Indication: 63; Score: 7   Patient Information:   3V-CAD without  LMCA With Low CAD burden(i.e., 3 focal stenoses, low SYNTAX score) CABG  A (9)  Indication: 63; Score: 9   Patient Information:   3V-CAD without LMCA E06c - Intermediate-high CAD burden (i.e., multiple diffuse lesions, presence of CTO, or high SYNTAX score) PCI  U (4)  Indication: 64; Score: 4   Patient Information:   3V-CAD without LMCA E06c - Intermediate-high CAD burden (i.e., multiple diffuse lesions, presence of CTO, or high SYNTAX score) CABG  A (9)  Indication: 64; Score: 9   Patient Information:   LMCA-CAD With Isolated LMCA stenosis  PCI  U (6)  Indication: 65; Score: 6   Patient Information:   LMCA-CAD With Isolated LMCA stenosis  CABG  A (9)  Indication: 65; Score: 9   Patient Information:   LMCA-CAD Additional CAD, low CAD burden (i.e., 1- to 2-vessel additional involvement, low SYNTAX score) PCI  U (5)  Indication: 66; Score: 5   Patient Information:   LMCA-CAD Additional CAD, low CAD burden (i.e., 1- to 2-vessel additional involvement, low SYNTAX score) CABG  A (9)  Indication: 66; Score: 9   Patient Information:   LMCA-CAD Additional CAD, intermediate-high CAD burden (i.e., 3-vessel involvement, presence of CTO, or high SYNTAX score) PCI  I (3)  Indication: 67; Score: 3   Patient Information:   LMCA-CAD Additional CAD, intermediate-high CAD burden (i.e., 3-vessel involvement, presence of CTO, or high SYNTAX score) CABG  A (9)  Indication: 67; Score: 9  Adrian Prows

## 2015-08-10 NOTE — Discharge Instructions (Signed)
Radial Site Care °Refer to this sheet in the next few weeks. These instructions provide you with information on caring for yourself after your procedure. Your caregiver may also give you more specific instructions. Your treatment has been planned according to current medical practices, but problems sometimes occur. Call your caregiver if you have any problems or questions after your procedure. °HOME CARE INSTRUCTIONS °· You may shower the day after the procedure. Remove the bandage (dressing) and gently wash the site with plain soap and water. Gently pat the site dry. °· Do not apply powder or lotion to the site. °· Do not submerge the affected site in water for 3 to 5 days. °· Inspect the site at least twice daily. °· Do not flex or bend the affected arm for 24 hours. °· No lifting over 5 pounds (2.3 kg) for 5 days after your procedure. °· Do not drive home if you are discharged the same day of the procedure. Have someone else drive you. °· You may drive 24 hours after the procedure unless otherwise instructed by your caregiver. °· Do not operate machinery or power tools for 24 hours. °· A responsible adult should be with you for the first 24 hours after you arrive home. °What to expect: °· Any bruising will usually fade within 1 to 2 weeks. °· Blood that collects in the tissue (hematoma) may be painful to the touch. It should usually decrease in size and tenderness within 1 to 2 weeks. °SEEK IMMEDIATE MEDICAL CARE IF: °· You have unusual pain at the radial site. °· You have redness, warmth, swelling, or pain at the radial site. °· You have drainage (other than a small amount of blood on the dressing). °· You have chills. °· You have a fever or persistent symptoms for more than 72 hours. °· You have a fever and your symptoms suddenly get worse. °· Your arm becomes pale, cool, tingly, or numb. °· You have heavy bleeding from the site. Hold pressure on the site and call 911. °Document Released: 12/27/2010 Document  Revised: 02/16/2012 Document Reviewed: 12/27/2010 °[employer]® Patient Information ©2015 [employer]. This information is not intended to replace advice given to you by your health care provider. Make sure you discuss any questions you have with your health care provider. ° °

## 2016-03-04 ENCOUNTER — Other Ambulatory Visit: Payer: Self-pay | Admitting: Urology

## 2016-03-05 NOTE — Patient Instructions (Addendum)
Travis Rollins  03/05/2016   Your procedure is scheduled on: 03/12/2016    Report to [facility]  Entrance take [place]  elevators to 3rd floor to  [name] at    [name] AM.  Call this number if you have problems the morning of surgery [phone]   Remember: ONLY 1 PERSON MAY GO WITH YOU TO SHORT STAY TO GET  READY MORNING OF [name].  Do not eat food or drink liquids :After Midnight.             Eat a good healthy snack prior to bedtime.    Take 1/2 of evening dose of Insulin nite before surgery   Take these medicines the morning of surgery with A SIP OF WATER: Allopurinol, Flomax, Lyrica,  DO NOT TAKE ANY DIABETIC MEDICATIONS DAY OF YOUR SURGERY                               You may not have any metal on your body including hair pins and              piercings  Do not wear jewelry, , lotions, powders or perfumes, deodorant                          Men may shave face and neck.   Do not bring valuables to the hospital. [name].  Contacts, dentures or bridgework may not be worn into surgery.  .     Patients discharged the day of surgery will not be allowed to drive home.  Name and phone number of your driver:  Special Instructions: coughing and deep breathing exercises, leg exercises               Please read over the following fact sheets you were given: _____________________________________________________________________             [facility] - Preparing for Surgery Before surgery, you can play an important role.  Because skin is not sterile, your skin needs to be as free of germs as possible.  You can reduce the number of germs on your skin by washing with CHG (chlorahexidine gluconate) soap before surgery.  CHG is an antiseptic cleaner which kills germs and bonds with the skin to continue killing germs even after washing. Please DO NOT use if you have an allergy to CHG or  antibacterial soaps.  If your skin becomes reddened/irritated stop using the CHG and inform your nurse when you arrive at Short Stay. Do not shave (including legs and underarms) for at least 48 hours prior to the first CHG shower.  You may shave your face/neck. Please follow these instructions carefully:  1.  Shower with CHG Soap the night before surgery and the  morning of Surgery.  2.  If you choose to wash your hair, wash your hair first as usual with your  normal  shampoo.  3.  After you shampoo, rinse your hair and body thoroughly to remove the  shampoo.                           4.  Use CHG as you would  any other liquid soap.  You can apply chg directly  to the skin and wash                       Gently with a scrungie or clean washcloth.  5.  Apply the CHG Soap to your body ONLY FROM THE NECK DOWN.   Do not use on face/ open                           Wound or open sores. Avoid contact with eyes, ears mouth and genitals (private parts).                       Wash face,  Genitals (private parts) with your normal soap.             6.  Wash thoroughly, paying special attention to the area where your surgery  will be performed.  7.  Thoroughly rinse your body with warm water from the neck down.  8.  DO NOT shower/wash with your normal soap after using and rinsing off  the CHG Soap.                9.  Pat yourself dry with a clean towel.            10.  Wear clean pajamas.            11.  Place clean sheets on your bed the night of your first shower and do not  sleep with pets. Day of Surgery : Do not apply any lotions/deodorants the morning of surgery.  Please wear clean clothes to the hospital/surgery center.  FAILURE TO FOLLOW THESE INSTRUCTIONS MAY RESULT IN THE CANCELLATION OF YOUR SURGERY PATIENT SIGNATURE_________________________________  NURSE SIGNATURE__________________________________  ________________________________________________________________________

## 2016-03-06 ENCOUNTER — Encounter (HOSPITAL_COMMUNITY)
Admission: RE | Admit: 2016-03-06 | Discharge: 2016-03-06 | Disposition: A | Discharge status: Home / Self Care | Payer: Medicare HMO | Source: Ambulatory Visit | Attending: Urology | Admitting: Urology

## 2016-03-06 ENCOUNTER — Encounter (HOSPITAL_COMMUNITY): Payer: Self-pay

## 2016-03-06 DIAGNOSIS — N202 Calculus of kidney with calculus of ureter: Secondary | ICD-10-CM | POA: Insufficient documentation

## 2016-03-06 DIAGNOSIS — Z01812 Encounter for preprocedural laboratory examination: Secondary | ICD-10-CM | POA: Insufficient documentation

## 2016-03-06 DIAGNOSIS — Z87442 Personal history of urinary calculi: Secondary | ICD-10-CM | POA: Diagnosis not present

## 2016-03-06 HISTORY — DX: Sleep apnea, unspecified: G47.30

## 2016-03-06 LAB — CBC
HEMATOCRIT: 41.4 % (ref 39.0–52.0)
Hemoglobin: 13.2 g/dL (ref 13.0–17.0)
MCH: 29.9 pg (ref 26.0–34.0)
MCHC: 31.9 g/dL (ref 30.0–36.0)
MCV: 93.9 fL (ref 78.0–100.0)
PLATELETS: 114 10*3/uL — AB (ref 150–400)
RBC: 4.41 MIL/uL (ref 4.22–5.81)
RDW: 15.6 % — AB (ref 11.5–15.5)
WBC: 4.7 10*3/uL (ref 4.0–10.5)

## 2016-03-06 LAB — BASIC METABOLIC PANEL
Anion gap: 9 (ref 5–15)
BUN: 33 mg/dL — AB (ref 6–20)
CALCIUM: 9.4 mg/dL (ref 8.9–10.3)
CO2: 28 mmol/L (ref 22–32)
Chloride: 104 mmol/L (ref 101–111)
Creatinine, Ser: 1.72 mg/dL — ABNORMAL HIGH (ref 0.61–1.24)
GFR calc Af Amer: 52 mL/min — ABNORMAL LOW (ref >60)
GFR, EST NON AFRICAN AMERICAN: 45 mL/min — AB (ref >60)
GLUCOSE: 108 mg/dL — AB (ref 65–99)
POTASSIUM: 4.5 mmol/L (ref 3.5–5.1)
SODIUM: 141 mmol/L (ref 135–145)

## 2016-03-06 LAB — SURGICAL PCR SCREEN
MRSA, PCR: NEGATIVE
Staphylococcus aureus: POSITIVE — AB

## 2016-03-06 LAB — PROTIME-INR
INR: 1.19 (ref 0.00–1.49)
PROTHROMBIN TIME: 15.2 s (ref 11.6–15.2)

## 2016-03-06 NOTE — Progress Notes (Signed)
EKG-08/10/15- EPIC  Cath-08/10/15- EPIC ECHo-2015-EPIC  Stress test- 07/2015 on chart  ECHO- 07/10/15 on chart  EKG- 07/09/2015 on chart  08/22/15- LOV with Cardiologist on chart

## 2016-03-07 NOTE — Progress Notes (Signed)
Patient called back and made him aware to stop Phentermine preop .  Patient voiced understanding.  Last dose of Phentermine on 03/07/2016.

## 2016-03-07 NOTE — Progress Notes (Signed)
Anesthesia ( Dr Gifford Shave) made aware patient on Phentermine.  Anesthesia ( Dr Gifford Shave ) stated for me to call patient and have him stop Phentermine as of today.  I called patient's number and family member reports he is currently not home and they have his cell phone.  I gave message to family member for patient to call me back this afternoon. I have information to give him regarding his surgery.

## 2016-03-11 MED ORDER — GENTAMICIN SULFATE 40 MG/ML IJ SOLN
5.0000 mg/kg | INTRAVENOUS | Status: AC
  Administered 2016-03-12: 450 mg via INTRAVENOUS
  Filled 2016-03-11: qty 11.25

## 2016-03-11 NOTE — Anesthesia Preprocedure Evaluation (Addendum)
Anesthesia Evaluation  Patient identified by MRN, date of birth, ID band Patient awake    Reviewed: Allergy & Precautions, H&P , NPO status , Patient's Chart, lab work & pertinent test results  Airway Mallampati: III  TM Distance: >3 FB Neck ROM: full    Dental  (+) Edentulous Upper, Dental Advisory Given, Missing Missing lower front and most back lower and all upper front:   Pulmonary shortness of breath and with exertion, sleep apnea , PE PE x 3. History acute respiratory failure with hypoxia   Pulmonary exam normal breath sounds clear to auscultation       Cardiovascular hypertension, Pt. on medications and Pt. on home beta blockers Normal cardiovascular exam Rhythm:regular Rate:Normal  AT CP   Neuro/Psych negative neurological ROS  negative psych ROS   GI/Hepatic negative GI ROS, Neg liver ROS,   Endo/Other  diabetes, Well Controlled, Type 2, Insulin DependentMorbid obesity  Renal/GU negative Renal ROS  negative genitourinary   Musculoskeletal   Abdominal (+) + obese,   Peds  Hematology negative hematology ROS (+) Thrombocytopenia - mild  114K   Anesthesia Other Findings   Reproductive/Obstetrics negative OB ROS                            Anesthesia Physical Anesthesia Plan  ASA: III  Anesthesia Plan: General   Post-op Pain Management:    Induction: Intravenous  Airway Management Planned: LMA  Additional Equipment:   Intra-op Plan:   Post-operative Plan:   Informed Consent:   Plan Discussed with: Surgeon  Anesthesia Plan Comments:         Anesthesia Quick Evaluation

## 2016-03-12 ENCOUNTER — Encounter (HOSPITAL_COMMUNITY): Payer: Self-pay | Admitting: *Deleted

## 2016-03-12 ENCOUNTER — Ambulatory Visit (HOSPITAL_COMMUNITY): Payer: Medicare HMO | Admitting: Anesthesiology

## 2016-03-12 ENCOUNTER — Encounter (HOSPITAL_COMMUNITY)
Admission: RE | Disposition: A | Discharge status: Home / Self Care | Payer: Self-pay | Source: Ambulatory Visit | Attending: Urology

## 2016-03-12 ENCOUNTER — Ambulatory Visit (HOSPITAL_COMMUNITY)
Admission: RE | Admit: 2016-03-12 | Discharge: 2016-03-12 | Disposition: A | Discharge status: Home / Self Care | Payer: Medicare HMO | Source: Ambulatory Visit | Attending: Urology | Admitting: Urology

## 2016-03-12 DIAGNOSIS — G4730 Sleep apnea, unspecified: Secondary | ICD-10-CM | POA: Diagnosis not present

## 2016-03-12 DIAGNOSIS — Z791 Long term (current) use of non-steroidal anti-inflammatories (NSAID): Secondary | ICD-10-CM | POA: Insufficient documentation

## 2016-03-12 DIAGNOSIS — I10 Essential (primary) hypertension: Secondary | ICD-10-CM | POA: Diagnosis not present

## 2016-03-12 DIAGNOSIS — Z7901 Long term (current) use of anticoagulants: Secondary | ICD-10-CM | POA: Diagnosis not present

## 2016-03-12 DIAGNOSIS — N132 Hydronephrosis with renal and ureteral calculous obstruction: Secondary | ICD-10-CM | POA: Diagnosis present

## 2016-03-12 DIAGNOSIS — E1151 Type 2 diabetes mellitus with diabetic peripheral angiopathy without gangrene: Secondary | ICD-10-CM | POA: Insufficient documentation

## 2016-03-12 DIAGNOSIS — N289 Disorder of kidney and ureter, unspecified: Secondary | ICD-10-CM | POA: Insufficient documentation

## 2016-03-12 DIAGNOSIS — Z6841 Body Mass Index (BMI) 40.0 and over, adult: Secondary | ICD-10-CM | POA: Diagnosis not present

## 2016-03-12 DIAGNOSIS — Z86718 Personal history of other venous thrombosis and embolism: Secondary | ICD-10-CM | POA: Diagnosis not present

## 2016-03-12 DIAGNOSIS — Z794 Long term (current) use of insulin: Secondary | ICD-10-CM | POA: Insufficient documentation

## 2016-03-12 DIAGNOSIS — Z79899 Other long term (current) drug therapy: Secondary | ICD-10-CM | POA: Insufficient documentation

## 2016-03-12 DIAGNOSIS — E790 Hyperuricemia without signs of inflammatory arthritis and tophaceous disease: Secondary | ICD-10-CM | POA: Insufficient documentation

## 2016-03-12 DIAGNOSIS — D696 Thrombocytopenia, unspecified: Secondary | ICD-10-CM | POA: Diagnosis not present

## 2016-03-12 DIAGNOSIS — Z86711 Personal history of pulmonary embolism: Secondary | ICD-10-CM | POA: Insufficient documentation

## 2016-03-12 HISTORY — PX: HOLMIUM LASER APPLICATION: SHX5852

## 2016-03-12 HISTORY — PX: CYSTOSCOPY WITH RETROGRADE PYELOGRAM, URETEROSCOPY AND STENT PLACEMENT: SHX5789

## 2016-03-12 LAB — GLUCOSE, CAPILLARY
GLUCOSE-CAPILLARY: 102 mg/dL — AB (ref 65–99)
Glucose-Capillary: 88 mg/dL (ref 65–99)
Glucose-Capillary: 72 mg/dL (ref 65–99)

## 2016-03-12 SURGERY — CYSTOURETEROSCOPY, WITH RETROGRADE PYELOGRAM AND STENT INSERTION
Anesthesia: General | Site: Ureter | Laterality: Right

## 2016-03-12 MED ORDER — MIDAZOLAM HCL 5 MG/5ML IJ SOLN
INTRAMUSCULAR | Status: DC | PRN
Start: 2016-03-12 — End: 2016-03-12
  Administered 2016-03-12: 2 mg via INTRAVENOUS

## 2016-03-12 MED ORDER — ONDANSETRON HCL 4 MG/2ML IJ SOLN
INTRAMUSCULAR | Status: DC | PRN
  Administered 2016-03-12: 4 mg via INTRAVENOUS

## 2016-03-12 MED ORDER — LIDOCAINE HCL (CARDIAC) 20 MG/ML IV SOLN
INTRAVENOUS | Status: DC | PRN
  Administered 2016-03-12: 50 mg via INTRAVENOUS

## 2016-03-12 MED ORDER — OXYCODONE-ACETAMINOPHEN 5-325 MG PO TABS
1.0000 | ORAL_TABLET | Freq: Once | ORAL | Status: AC
  Administered 2016-03-12: 1 via ORAL
  Filled 2016-03-12: qty 1

## 2016-03-12 MED ORDER — PROPOFOL 10 MG/ML IV BOLUS
INTRAVENOUS | Status: DC | PRN
  Administered 2016-03-12: 200 mg via INTRAVENOUS

## 2016-03-12 MED ORDER — LACTATED RINGERS IV SOLN: INTRAVENOUS | Status: DC

## 2016-03-12 MED ORDER — METOPROLOL SUCCINATE ER 50 MG PO TB24
50.0000 mg | ORAL_TABLET | Freq: Once | ORAL | Status: AC
  Administered 2016-03-12: 50 mg via ORAL
  Filled 2016-03-12: qty 1

## 2016-03-12 MED ORDER — SODIUM CHLORIDE 0.9 % IR SOLN
Status: DC | PRN
  Administered 2016-03-12: 7000 mL

## 2016-03-12 MED ORDER — FENTANYL CITRATE (PF) 100 MCG/2ML IJ SOLN: 25.0000 ug | INTRAMUSCULAR | Status: DC | PRN

## 2016-03-12 MED ORDER — IOPAMIDOL (ISOVUE-300) INJECTION 61%
INTRAVENOUS | Status: AC
  Filled 2016-03-12: qty 50

## 2016-03-12 MED ORDER — FENTANYL CITRATE (PF) 250 MCG/5ML IJ SOLN
INTRAMUSCULAR | Status: AC
  Filled 2016-03-12: qty 5

## 2016-03-12 MED ORDER — LACTATED RINGERS IV SOLN
INTRAVENOUS | Status: DC | PRN
  Administered 2016-03-12: 07:00:00 via INTRAVENOUS

## 2016-03-12 MED ORDER — MIDAZOLAM HCL 2 MG/2ML IJ SOLN
INTRAMUSCULAR | Status: AC
  Filled 2016-03-12: qty 2

## 2016-03-12 MED ORDER — LACTATED RINGERS IV SOLN
INTRAVENOUS | Status: DC
  Administered 2016-03-12: 11:00:00 via INTRAVENOUS

## 2016-03-12 MED ORDER — SENNOSIDES-DOCUSATE SODIUM 8.6-50 MG PO TABS: 1.0000 | ORAL_TABLET | Freq: Two times a day (BID) | ORAL | Status: DC

## 2016-03-12 MED ORDER — SULFAMETHOXAZOLE-TRIMETHOPRIM 800-160 MG PO TABS: 1.0000 | ORAL_TABLET | Freq: Two times a day (BID) | ORAL | Status: DC

## 2016-03-12 MED ORDER — PHENYLEPHRINE 40 MCG/ML (10ML) SYRINGE FOR IV PUSH (FOR BLOOD PRESSURE SUPPORT)
PREFILLED_SYRINGE | INTRAVENOUS | Status: AC
  Filled 2016-03-12: qty 20

## 2016-03-12 MED ORDER — PROPOFOL 10 MG/ML IV BOLUS
INTRAVENOUS | Status: AC
  Filled 2016-03-12: qty 40

## 2016-03-12 MED ORDER — FENTANYL CITRATE (PF) 100 MCG/2ML IJ SOLN
INTRAMUSCULAR | Status: DC | PRN
  Administered 2016-03-12: 25 ug via INTRAVENOUS
  Administered 2016-03-12: 50 ug via INTRAVENOUS
  Administered 2016-03-12 (×4): 25 ug via INTRAVENOUS

## 2016-03-12 MED ORDER — PHENYLEPHRINE HCL 10 MG/ML IJ SOLN
INTRAMUSCULAR | Status: DC | PRN
  Administered 2016-03-12 (×7): 80 ug via INTRAVENOUS

## 2016-03-12 MED ORDER — PHENYLEPHRINE 40 MCG/ML (10ML) SYRINGE FOR IV PUSH (FOR BLOOD PRESSURE SUPPORT)
PREFILLED_SYRINGE | INTRAVENOUS | Status: AC
  Filled 2016-03-12: qty 10

## 2016-03-12 MED ORDER — OXYCODONE-ACETAMINOPHEN 5-325 MG PO TABS: 1.0000 | ORAL_TABLET | Freq: Four times a day (QID) | ORAL | Status: DC | PRN

## 2016-03-12 MED ORDER — IOPAMIDOL (ISOVUE-300) INJECTION 61%
INTRAVENOUS | Status: DC | PRN
  Administered 2016-03-12: 50 mL via URETHRAL

## 2016-03-12 MED ORDER — 0.9 % SODIUM CHLORIDE (POUR BTL) OPTIME
TOPICAL | Status: DC | PRN
  Administered 2016-03-12: 1000 mL

## 2016-03-12 SURGICAL SUPPLY — 23 items
BAG URO CATCHER STRL LF (MISCELLANEOUS) ×3 IMPLANT
BASKET LASER NITINOL 1.9FR (BASKET) ×3 IMPLANT
CATH INTERMIT  6FR 70CM (CATHETERS) ×3 IMPLANT
CLOTH BEACON ORANGE TIMEOUT ST (SAFETY) ×3 IMPLANT
FIBER LASER FLEXIVA 1000 (UROLOGICAL SUPPLIES) IMPLANT
FIBER LASER FLEXIVA 200 (UROLOGICAL SUPPLIES) ×3 IMPLANT
FIBER LASER FLEXIVA 365 (UROLOGICAL SUPPLIES) IMPLANT
FIBER LASER FLEXIVA 550 (UROLOGICAL SUPPLIES) IMPLANT
FIBER LASER TRAC TIP (UROLOGICAL SUPPLIES) ×3 IMPLANT
GLOVE BIOGEL M STRL SZ7.5 (GLOVE) ×3 IMPLANT
GOWN STRL REUS W/TWL LRG LVL3 (GOWN DISPOSABLE) ×6 IMPLANT
GUIDEWIRE ANG ZIPWIRE 038X150 (WIRE) ×3 IMPLANT
GUIDEWIRE STR DUAL SENSOR (WIRE) ×3 IMPLANT
IV NS 1000ML (IV SOLUTION) ×2
IV NS 1000ML BAXH (IV SOLUTION) ×1 IMPLANT
MANIFOLD NEPTUNE II (INSTRUMENTS) ×3 IMPLANT
PACK CYSTO (CUSTOM PROCEDURE TRAY) ×3 IMPLANT
SHEATH ACCESS URETERAL 38CM (SHEATH) ×3 IMPLANT
STENT POLARIS 5FRX26 (STENTS) ×3 IMPLANT
SYR CONTROL 10ML LL (SYRINGE) ×3 IMPLANT
TUBE FEEDING 8FR 16IN STR KANG (MISCELLANEOUS) ×3 IMPLANT
TUBING CONNECTING 10 (TUBING) ×2 IMPLANT
TUBING CONNECTING 10' (TUBING) ×1

## 2016-03-12 NOTE — Brief Op Note (Signed)
03/12/2016  10:01 AM  PATIENT:  Georgianne Fick  50 y.o. male  PRE-OPERATIVE DIAGNOSIS:  RECURRENT RIGHT URETERAL AND RENAL STONES  POST-OPERATIVE DIAGNOSIS:  recurrent right ureteral and renal stones  PROCEDURE:  Procedure(s): CYSTOSCOPY WITH RETROGRADE PYELOGRAM, URETEROSCOPY AND STENT PLACEMENT (Right) HOLMIUM LASER APPLICATION (Right)  SURGEON:  Surgeon(s) and Role:    * Alexis Frock, MD - Primary  PHYSICIAN ASSISTANT:   ASSISTANTS: none   ANESTHESIA:   general  EBL:  Total I/O In: 800 [I.V.:800] Out: 5 [Blood:5]  BLOOD ADMINISTERED:none  DRAINS: none   LOCAL MEDICATIONS USED:  NONE  SPECIMEN:  Source of Specimen:  Rt renal and ureteral stone fragments  DISPOSITION OF SPECIMEN:  [facility] for compositional analysis  COUNTS:  YES  TOURNIQUET:  * No tourniquets in log *  DICTATION: .Other Dictation: Dictation Number [id number]  PLAN OF CARE: Discharge to home after PACU  PATIENT DISPOSITION:  PACU - hemodynamically stable.   Delay start of Pharmacological VTE agent (>24hrs) due to surgical blood loss or risk of bleeding: yes

## 2016-03-12 NOTE — Transfer of Care (Signed)
Immediate Anesthesia Transfer of Care Note  Patient: Travis Rollins  Procedure(s) Performed: Procedure(s): CYSTOSCOPY WITH RETROGRADE PYELOGRAM, URETEROSCOPY AND STENT PLACEMENT (Right) HOLMIUM LASER APPLICATION (Right)  Patient Location: PACU  Anesthesia Type:General  Level of Consciousness:  sedated, patient cooperative and responds to stimulation  Airway & Oxygen Therapy:Patient Spontanous Breathing and Patient connected to face mask oxgen  Post-op Assessment:  Report given to PACU RN and Post -op Vital signs reviewed and stable  Post vital signs:  Reviewed and stable  Last Vitals:  Filed Vitals:   03/12/16 0559  BP: 138/90  Pulse: 88  Temp: 36.4 C  Resp: 18    Complications: No apparent anesthesia complications

## 2016-03-12 NOTE — H&P (Signed)
Travis Rollins is an 50 y.o. male.    Chief Complaint: Pre-OP RIGHT Ureteroscopic stone manipulation  HPI:  1 - Recurent Nephrolithiasis - Pre 2016 - medical passage ureteral stone x1 12/2014 Rt ureteroscopic stone manipulation  for 73mm renal stone  in setting of recurrent hematuria. No additional stones. 03/2016 Rt 38mm distal ureteral stone with mod hydro. Punctate Rt renal stone, Lt side clear by KUB, [place], CT. Cr 1.6.     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  Today Travis Rollins is seen to proceed with right ureteroscopy for his right ureteral stone in setting of mild renal insuficiency, occasional flank pain and new right hydro. No interval fevers. Most recent UA without infectios parameters.   Past Medical History  Diagnosis Date  . Hypertension   . Deep vein thrombosis ([place]) 07/2013, 10/2014, 03/16/2015    LLE (studies at [facility])  . Obesity   . Diabetes mellitus   . Peripheral vascular disease ([place])     6 yrs ago, DVT in Lt knee/ groin  . Anemia   . Pulmonary embolism ([facility])     hx of x 3 per patient; most recent 02/2014  . Sleep apnea     Past Surgical History  Procedure Laterality Date  . Tonsillectomy    . Ivc filter   07/2014   . Cystoscopy with retrograde pyelogram, ureteroscopy and stent placement Bilateral 01/03/2015    Procedure: CYSTOSCOPY WITH RETROGRADE PYELOGRAM, RIGHT URETEROSCOPY AND RIGHT STENT PLACEMENT;  Surgeon: Alexis Frock, MD;  Location: [facility];  Service: Urology;  Laterality: Bilateral;  . Holmium laser application Right [id number]    Procedure: HOLMIUM LASER APPLICATION;  Surgeon: Alexis Frock, MD;  Location: [facility];  Service: Urology;  Laterality:  Right;  . Cardiac catheterization N/A 08/10/2015    Procedure: Left Heart Cath and Coronary Angiography;  Surgeon: Adrian Prows, MD;  Location: [facility];  Service: Cardiovascular;  Laterality: N/A;  . Tubes removd from ears      History reviewed. No pertinent family history. Social History:  reports that he has never smoked. He has never used smokeless tobacco. He reports that he drinks alcohol. He reports that he does not use illicit drugs.  Allergies:  Allergies  Allergen Reactions  . Penicillins Hives    Has patient had a PCN reaction causing immediate rash, facial/tongue/throat swelling, SOB or lightheadedness with hypotension: No Has patient had a PCN reaction causing severe rash involving mucus membranes or skin necrosis: No Has patient had a PCN reaction that required hospitalization No Has patient had a PCN reaction occurring within the last 10 years: No If all of the above answers are "NO", then may proceed with Cephalosporin use.    Medications Prior to Admission  Medication Sig Dispense Refill  . allopurinol (ZYLOPRIM) 100 MG tablet Take 100 mg by mouth daily.    Marland Kitchen apixaban (ELIQUIS) 2.5 MG TABS tablet Take 2.5 mg by mouth 2 (two) times daily.    Marland Kitchen atorvastatin (LIPITOR) 40 MG tablet Take 40 mg by mouth at bedtime.     . Cholecalciferol (VITAMIN D3) 5000 UNITS CAPS Take 5,000 Units by mouth at bedtime.     Marland Kitchen  clomiPHENE (CLOMID) 50 MG tablet Take 50 mg by mouth 3 (three) times a week.    . Coenzyme Q10 (CO Q 10) 100 MG CAPS Take 1 capsule by mouth at bedtime.     . Empagliflozin-Metformin HCl (SYNJARDY) 12.04-999 MG TABS Take 1 tablet by mouth 2 (two) times daily.    . ferrous fumarate (HEMOCYTE - 106 MG FE) 325 (106 FE) MG TABS tablet Take 1 tablet by mouth at bedtime.     . furosemide (LASIX) 40 MG tablet Take 40 mg by mouth every morning.     . insulin aspart (NOVOLOG FLEXPEN) 100 UNIT/ML FlexPen Inject 12-15 Units into the skin 3 (three) times daily with meals. 15  units three times daily with meal unless meal is small then inject 12 units    . Insulin Glargine (LANTUS SOLOSTAR) 100 UNIT/ML Solostar Pen Inject 100 Units into the skin daily at 10 pm.     . Liraglutide (VICTOZA) 18 MG/3ML SOPN Inject 18 mg into the skin every morning.    Marland Kitchen lisinopril (PRINIVIL,ZESTRIL) 2.5 MG tablet Take 2.5 mg by mouth daily.    . meloxicam (MOBIC) 15 MG tablet Take 15 mg by mouth every morning.    . metoprolol succinate (TOPROL-XL) 50 MG 24 hr tablet Take 50 mg by mouth at bedtime. Take with or immediately following a meal.    . niacin 500 MG CR capsule Take 500 mg by mouth at bedtime.     . phentermine (ADIPEX-P) 37.5 MG tablet Take 37.5 mg by mouth daily before breakfast. Last dose 03/07/2016    . potassium chloride (K-DUR) 10 MEQ tablet Take 10 mEq by mouth at bedtime.     . pregabalin (LYRICA) 150 MG capsule Take 150 mg by mouth 2 (two) times daily.    . tamsulosin (FLOMAX) 0.4 MG CAPS capsule Take 0.4 mg by mouth daily after breakfast.    . nitroGLYCERIN (NITROSTAT) 0.4 MG SL tablet Place 0.4 mg under the tongue every 5 (five) minutes as needed for chest pain.    . sitaGLIPtin-metformin (JANUMET) 50-500 MG per tablet Take 1 tablet by mouth daily after breakfast. (Patient not taking: Reported on 03/06/2016)      Results for orders placed or performed during the hospital encounter of 03/12/16 (from the past 48 hour(s))  Glucose, capillary     Status: Abnormal   Collection Time: 03/12/16  5:54 AM  Result Value Ref Range   Glucose-Capillary 102 (H) 65 - 99 mg/dL   Comment 1 Notify RN    No results found.  Review of Systems  Constitutional: Negative.  Negative for fever and chills.  HENT: Negative.   Eyes: Negative.   Respiratory: Negative.   Cardiovascular: Negative.   Gastrointestinal: Negative.   Genitourinary: Positive for flank pain.  Musculoskeletal: Negative.   Skin: Negative.   Neurological: Negative.   Endo/Heme/Allergies: Negative.    Psychiatric/Behavioral: Negative.     Blood pressure 138/90, pulse 88, temperature 97.5 F (36.4 C), temperature source Oral, resp. rate 18, height 5\' 8"  (1.727 m), weight 120.657 kg (266 lb), SpO2 98 %. Physical Exam  Constitutional: He appears well-developed.  HENT:  Head: Normocephalic.  Eyes: Pupils are equal, round, and reactive to light.  Neck: Normal range of motion.  Cardiovascular: Normal rate.   Respiratory: Effort normal.  GI: Soft.  Genitourinary:  Mild Rt CVAT  Musculoskeletal: Normal range of motion.  Neurological: He is alert.  Stigmata of mild MR, at baseline, AOx3  Skin: Skin is warm.  Psychiatric: He has a normal mood and affect.     Assessment/Plan  1 -  Recurrent Nephrolithiasis  - now with likely recurrent right sided stones and obstruction. His Cr is up some as well. Rec right sided ureteroscopy with goal of stone free. Fortunately not much colic at present. Risks, benefits, alternatives discussed.  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    Alexis Frock, MD 03/12/2016, 7:01 AM

## 2016-03-12 NOTE — Anesthesia Postprocedure Evaluation (Signed)
Anesthesia Post Note  Patient: Travis Rollins  Procedure(s) Performed: Procedure(s) (LRB): CYSTOSCOPY WITH RETROGRADE PYELOGRAM, URETEROSCOPY AND STENT PLACEMENT (Right) HOLMIUM LASER APPLICATION (Right)  Patient location during evaluation: PACU Anesthesia Type: General Level of consciousness: awake and alert Pain management: pain level controlled Vital Signs Assessment: post-procedure vital signs reviewed and stable Respiratory status: spontaneous breathing, nonlabored ventilation, respiratory function stable and patient connected to nasal cannula oxygen Cardiovascular status: blood pressure returned to baseline and stable Postop Assessment: no signs of nausea or vomiting Anesthetic complications: no    Last Vitals:  Filed Vitals:   03/12/16 1045 03/12/16 1058  BP: 113/72 118/79  Pulse: 94 101  Temp: 36.7 C 36.6 C  Resp: 16 20    Last Pain:  Filed Vitals:   03/12/16 1112  PainSc: 5                  [name] L

## 2016-03-12 NOTE — Op Note (Signed)
NAMERAYMOND, Travis Rollins            ACCOUNT NO.:  [id number]  MEDICAL RECORD NO.:  [id number]  LOCATION:  [facility]                         FACILITY:  [facility]  PHYSICIAN:  Alexis Frock, MD     DATE OF BIRTH:  1966/03/16  DATE OF PROCEDURE: 03/12/2016                              OPERATIVE REPORT  DIAGNOSIS:  Recurrent nephrolithiasis, right ureteral stone with hydronephrosis.  PROCEDURES: 1. Cystourethroscopy with right retrograde pyelogram and     interpretation. 2. Right ureteroscopy with laser lithotripsy. 3. Insertion of right ureteral stent, 5 x 26 Polaris with tether.  ESTIMATED BLOOD LOSS:  Nil.  COMPLICATION:  None.  SPECIMENS:  Right ureteral and renal stone fragments for compositional analysis.  FINDINGS: 1. Unremarkable urinary bladder. 2. Large high-grade filling defect in the distal right ureter     consistent with known ureteral stone. 3. Obstructing right ureteral stone with ureteroscopy. 4. Multifocal right lower pole renal stones, nonobstructing. 5. Complete resolution of all stone fragments, larger than 1/3rd mm in     the right kidney and ureter following laser lithotripsy and basket     extraction. 6. Successful placement of right ureteral stent, proximal in the renal     pelvis and distal in the urinary bladder.  INDICATION:  Travis Rollins is a very pleasant 50 year old gentleman with history of obesity, diabetes and mild mental retardation, who has had bouts of recurrent nephrolithiasis that to be likely due to hyperuricemia and hyperuricosuria.  He underwent stone removal several years ago, has been on surveillance for this and most recent round surveillance was noted to have some new right hydronephrosis worrisome for stone recurrence.  Actual imaging was then performed, which corroborated some lower pole right renal stone, but a large right distal ureteral stone.  He also had some worsening of renal insufficiency with creatinine of 1.6 from baseline  of less than 1.5.  Options were discussed for management including right ureteroscopy with goals for right-sided stone free and he wished to proceed.  Informed consent was obtained and placed in the medical record.  PROCEDURE IN DETAIL:  [name], was verified. Procedure being right ureteroscopic stone manipulation was confirmed. Procedure was carried out.  Time-out was performed.  Intravenous antibiotics were administered.  General LMA anesthesia was introduced. The patient was placed into a low lithotomy position and sterile field was created by prepping and draping the patient's penis, perineum, and proximal thighs using iodine x3.  Next, cystourethroscopy was performed using a 23-French rigid cystoscope with 30-degree offset lens. Inspection of the anterior and posterior urethra were unremarkable. Inspection of the urinary bladder revealed no diverticula, calcifications, papular lesions.  The right ureteral orifice was cannulated with a 6-French end-hole catheter and right retrograde pyelogram was obtained.  Right retrograde pyelogram demonstrated a single right ureter with single-system right kidney.  There was filling defect, high-grade in the distal ureter consistent with known stone.  A 0.038 Zip wire was advanced at the level of the upper pole and set aside as a safety wire. An 8-French feeding tube was placed in the urinary bladder for pressure release.  Next, semi-rigid ureteroscopy was performed in the distal ureter alongside a separate Sensor working wire.  As expected, there was a single large ovoid stone in the distal ureter.  This appeared to be much too large for simple basketing.  As such, holmium laser energy was applied to the stone using settings of 0.2 joules and 20 hertz using combination of dusting and fragmentation technique whereby approximately third of the stone was ablated and remainder 2/3rd was fragmented into pieces,  approximately 2-3 mm in maximum diameter.  These were then grasped sequentially with an Escape basket, brought into the urinary bladder and set aside for later irrigation.  Ureteroscopy was performed with the more proximal ureter was performed and no additional stones were noted, no mucosal abnormalities.  As the goal was ipsilateral stone free, there was felt to be likely intrarenal stone, the semi-rigid ureteroscope was exchanged for 12/14, 38-cm ureteral access sheath using continuous fluoroscopic guidance to the level of proximal ureter and flexible digital ureteroscopy was performed using dual-channel ureteroscope.  There was a collection of stones in the lower pole that individually appeared to be amenable to basketing.  As such, an Escape basket was used to grasp these stones and they were removed one by one and set aside for compositional analysis.  Following these maneuvers, there was complete resolution of all accessible stone fragments larger than 1/3rd mm.  Excellent hemostasis, no evidence of perforation.  The sheath was removed under continuous vision, no mucosal abnormalities were found.  Given the prolonged procedure with the use of access sheath, it was felt that interval stenting would be warranted.  As such, a new 5 x 26 Polaris-type stent was placed using cystoscopic and fluoroscopic guidance.  Tether was left in place.  The bladder was then emptied per cystoscope and fragments irrigated, and procedure terminated.  The patient tolerated the procedure well.  There were no immediate periprocedural complications.  The patient was taken to the postanesthesia care unit in stable condition.          ______________________________ Alexis Frock, MD     TM/MEDQ  D:  03/12/2016  T:  03/12/2016  Job:  [id number]

## 2016-03-12 NOTE — Progress Notes (Signed)
Dr. Landry Dyke  Made aware of patient's heart rates 101-103; also made aware of patient's history of sleep apnea- does not wear C-PAP- O.K. To go to Short  Stay

## 2016-03-12 NOTE — Discharge Instructions (Signed)
1 - You may have urinary urgency (bladder spasms) and bloody urine on / off with stent in place. This is normal.  2 - Call MD or go to ER for fever >102, severe pain / nausea / vomiting not relieved by medications, or acute change in medical status  3 - Remove tethered stent on Monday morning at home by puling on string, then blue-white plastic tubing, and discarding. Dr. Tresa Moore is in the office Monday if any issues arise.

## 2016-03-13 LAB — HEMOGLOBIN A1C
HEMOGLOBIN A1C: 5.9 % — AB (ref 4.8–5.6)
Mean Plasma Glucose: 123 mg/dL

## 2016-10-21 ENCOUNTER — Ambulatory Visit (INDEPENDENT_AMBULATORY_CARE_PROVIDER_SITE_OTHER): Payer: Medicare HMO | Admitting: Orthopaedic Surgery

## 2016-10-28 ENCOUNTER — Telehealth (INDEPENDENT_AMBULATORY_CARE_PROVIDER_SITE_OTHER): Payer: Self-pay | Admitting: Radiology

## 2016-10-28 NOTE — Telephone Encounter (Signed)
Elberta Fortis is calling to check on forms they faxed for orthotics and bracing for this patient.  He says pt had an appt on 10/22/16, and asked them to fax the paperwork over to [place].  I did not go into detail with him, but it looks at if patient cancelled that appt and resched another.  So we have not even seen pt yet.

## 2016-10-29 NOTE — Telephone Encounter (Signed)
I left voicemail for Travis Rollins advising. Patient canceled his appt and has been rescheduled for 12/8. He has not been seen in the office.

## 2016-11-14 ENCOUNTER — Ambulatory Visit (INDEPENDENT_AMBULATORY_CARE_PROVIDER_SITE_OTHER): Payer: Medicare HMO | Admitting: Orthopaedic Surgery

## 2016-11-14 ENCOUNTER — Encounter (INDEPENDENT_AMBULATORY_CARE_PROVIDER_SITE_OTHER): Payer: Self-pay | Admitting: Orthopaedic Surgery

## 2016-11-14 VITALS — BP 116/72 | HR 67 | Ht 68.0 in | Wt 265.0 lb

## 2016-11-14 DIAGNOSIS — M25512 Pain in left shoulder: Secondary | ICD-10-CM | POA: Diagnosis not present

## 2016-11-14 DIAGNOSIS — G8929 Other chronic pain: Secondary | ICD-10-CM | POA: Diagnosis not present

## 2016-11-14 MED ORDER — LIDOCAINE HCL 1 % IJ SOLN
1.0000 mL | INTRAMUSCULAR | Status: AC | PRN
  Administered 2016-11-14: 1 mL

## 2016-11-14 MED ORDER — BUPIVACAINE HCL 0.25 % IJ SOLN
4.0000 mL | INTRAMUSCULAR | Status: AC | PRN
  Administered 2016-11-14: 4 mL via INTRA_ARTICULAR

## 2016-11-14 MED ORDER — METHYLPREDNISOLONE ACETATE 40 MG/ML IJ SUSP
40.0000 mg | INTRAMUSCULAR | Status: AC | PRN
  Administered 2016-11-14: 40 mg via INTRA_ARTICULAR

## 2016-11-14 NOTE — Progress Notes (Signed)
Office Visit Note   Patient: Travis Rollins           Date of Birth: 04/17/1966           MRN: [id number] Visit Date: 11/14/2016              Requested by: Travis Rollins, [place], [place] [id number] PCP: Travis Drafts, FNP   Assessment & Plan: Visit Diagnoses:  1. Chronic left shoulder pain    2.      Diabetes type 2 on insulin. He takes Lantus at night ossific toes in the morning and then NovoLog with meals 12-18 units roughly. Plan: He had some pain subacromial injection. He has some before meals joint degenerative changes no glenohumeral arthritis. If he has persistent problems she can return to let us know or call and we can proceed with an MRI scan to rule out rotator cuff tear. Follow-Up Instructions: No Follow-up on file.   Orders:  No orders of the defined types were placed in this encounter.  No orders of the defined types were placed in this encounter.     Procedures: Large Joint Inj Date/Time: 11/14/2016 4:24 PM Performed by: Travis Rollins Authorized by: Travis Rollins   Consent Given by:  Patient Indications:  Pain Location:  Shoulder Site:  L subacromial bursa Prep: patient was prepped and draped in usual sterile fashion   Needle Size:  22 G Needle Length:  1.5 inches Ultrasound Guidance: No   Fluoroscopic Guidance: No   Arthrogram: No   Medications:  1 mL lidocaine 1 %; 40 mg methylPREDNISolone acetate 40 MG/ML; 4 mL bupivacaine 0.25 % Aspiration Attempted: No   Patient tolerance:  Patient tolerated the procedure well with no immediate complications     Clinical Data: No additional findings.   Subjective: Chief Complaint  Patient presents with  . Left Shoulder - Pain    [name] is here for his left shoulder pain.  States that it is sometimes painful and has discomfort with it, along with weakness. He states that he does not have neck pain at this time.  States that it is worse with evaluating it to  high or lift ing something to heavy. Patient brought a cd from [facility] of xray of left shoulder.    Review of Systems  Constitutional: Negative for chills and diaphoresis.  HENT: Negative for ear discharge, ear pain and nosebleeds.   Eyes: Negative for discharge and visual disturbance.  Respiratory: Negative for cough, choking and shortness of breath.   Cardiovascular: Negative for chest pain and palpitations.  Gastrointestinal: Negative for abdominal distention and abdominal pain.  Endocrine: Negative for cold intolerance and heat intolerance.       Positive for diabetes morbid obesity.  Genitourinary: Negative for flank pain and hematuria.  Musculoskeletal:       History of DVT pulmonary embolism chronic venous stasis changes left lower extremity he has a venous filter still implanted. Pulmonary embolism 3 most recent March 2015.  Skin: Negative for rash and wound.  Neurological: Negative for seizures and speech difficulty.  Hematological: Negative for adenopathy. Does not bruise/bleed easily.  Psychiatric/Behavioral: Negative for agitation and suicidal ideas.     Objective: Vital Signs: BP 116/72 (BP Location: Left Arm, Patient Position: Sitting)   Pulse 67   Ht 5\' 8"  (1.727 m)   Wt 265 lb (120.2 kg)   BMI 40.29 kg/m   Physical Exam  Constitutional: He  is oriented to person, place, and time. He appears well-developed and well-nourished.  Morbid obesity  HENT:  Head: Normocephalic and atraumatic.  Eyes: EOM are normal. Pupils are equal, round, and reactive to light.  Neck: No tracheal deviation present. No thyromegaly present.  Cardiovascular: Normal rate.   Pulmonary/Chest: Effort normal. He has no wheezes.  Abdominal: Soft. Bowel sounds are normal.  Musculoskeletal:  4+ edema left lower extremity 2+ on the right. Chronic venous stasis changes and fluid in the left leg. He is not wearing TED hose.  Neurological: He is alert and oriented to person, place, and time.   Skin: Skin is warm and dry. Capillary refill takes less than 2 seconds.  Psychiatric: He has a normal mood and affect. His behavior is normal. Judgment and thought content normal.    Ortho Exam good cervical range of motion. He can get his right arm over his head easily with abduction on the left iliac is to 90 he can reach full over the position with his elbow flexed and moving her shoulder in a flexed position. Positive impingement negative drop arm test.  Specialty Comments:  No specialty comments available.  Imaging: No results found.   PMFS History: Patient Active Problem List   Diagnosis Date Noted  . Atypical chest pain 08/09/2015  . Thrombocytopenia ([place]) 02/26/2014  . PE (pulmonary thromboembolism) ([place]) 02/25/2014  . Hyperkalemia 02/25/2014  . Acute respiratory failure with hypoxia ([place]) 02/25/2014  . Morbid obesity ([place]) 03/20/2012  . DM (diabetes mellitus), type 2 with renal complications ([place]) 123456  . Venous stasis 03/20/2012   Past Medical History:  Diagnosis Date  . Anemia   . Deep vein thrombosis ([place]) 07/2013, 10/2014, 03/16/2015   [facility] ([facility])  . Diabetes mellitus   . Hypertension   . Obesity   . Peripheral vascular disease ([place])    6 yrs ago, DVT in Lt knee/ groin  . Pulmonary embolism ([facility])    hx of x 3 per patient; most recent 02/2014  . Sleep apnea     No family history on file.  Past Surgical History:  Procedure Laterality Date  . CARDIAC CATHETERIZATION N/A 08/10/2015   Procedure: Left Heart Cath and Coronary Angiography;  Surgeon: Travis Prows, MD;  Location: [facility];  Service: Cardiovascular;  Laterality: N/A;  . CYSTOSCOPY WITH RETROGRADE PYELOGRAM, URETEROSCOPY AND STENT PLACEMENT Bilateral 01/03/2015   Procedure: CYSTOSCOPY WITH RETROGRADE PYELOGRAM, RIGHT URETEROSCOPY AND RIGHT STENT PLACEMENT;  Surgeon: Travis Frock, MD;  Location: [facility];  Service: Urology;  Laterality: Bilateral;  . CYSTOSCOPY WITH RETROGRADE  PYELOGRAM, URETEROSCOPY AND STENT PLACEMENT Right 03/12/2016   Procedure: CYSTOSCOPY WITH RETROGRADE PYELOGRAM, URETEROSCOPY AND STENT PLACEMENT;  Surgeon: Travis Frock, MD;  Location: [facility];  Service: Urology;  Laterality: Right;  . HOLMIUM LASER APPLICATION Right [id number]   Procedure: HOLMIUM LASER APPLICATION;  Surgeon: Travis Frock, MD;  Location: [facility];  Service: Urology;  Laterality: Right;  . HOLMIUM LASER APPLICATION Right [id number]   Procedure: HOLMIUM LASER APPLICATION;  Surgeon: Travis Frock, MD;  Location: [facility];  Service: Urology;  Laterality: Right;  . IVC filter   07/2014   . TONSILLECTOMY    . tubes removd from ears     Social History   Occupational History  . Not on file.   Social History Main Topics  . Smoking status: Never Smoker  . Smokeless tobacco: Never Used  . Alcohol use Yes     Comment: rare  . Drug use:  No  . Sexual activity: Yes    Birth control/ protection: Condom

## 2016-11-18 ENCOUNTER — Telehealth (INDEPENDENT_AMBULATORY_CARE_PROVIDER_SITE_OTHER): Payer: Self-pay | Admitting: Orthopaedic Surgery

## 2016-11-18 NOTE — Telephone Encounter (Signed)
Anothony Mckinnis with [facility] requesting script for pt Left shoulder brace, and tins unit. His number is [phone]

## 2016-11-20 NOTE — Telephone Encounter (Signed)
The form is here and is in medical records waiting to be sent to Dr. Lorin Mercy to sign off on.

## 2016-11-27 NOTE — Telephone Encounter (Signed)
Left message advising

## 2017-01-15 ENCOUNTER — Encounter: Payer: Medicare HMO | Attending: Nurse Practitioner | Admitting: *Deleted

## 2017-01-15 DIAGNOSIS — I10 Essential (primary) hypertension: Secondary | ICD-10-CM | POA: Insufficient documentation

## 2017-01-15 DIAGNOSIS — E119 Type 2 diabetes mellitus without complications: Secondary | ICD-10-CM | POA: Diagnosis present

## 2017-01-15 DIAGNOSIS — Z713 Dietary counseling and surveillance: Secondary | ICD-10-CM | POA: Insufficient documentation

## 2017-01-15 DIAGNOSIS — E669 Obesity, unspecified: Secondary | ICD-10-CM | POA: Insufficient documentation

## 2017-01-15 NOTE — Patient Instructions (Addendum)
Plan: Be aware of the portion size.  Balance the carbohydrates throughout the day. Aim for 3 carbohydrate servings (=45 grams) with each meal +/- 1 either way Aim for 0-1 carbohydrate servings (0-15 grams) for each snack if hungry Continue including lean meat in moderate portion sizes with your meals Remember, vegetables are free foods Use little fat. Consider increasing your activity level by walking either outside or inside, depending on the weather If you start to have low Blood Sugar with your weight loss and increased activity level, call your Dr to get your insulin dose decreased.

## 2017-01-26 NOTE — Progress Notes (Signed)
Diabetes Self-Management Education  Visit Type: Follow-up  Appt. Start Time: 1030 Appt. End Time: 1200  01/26/2017  [name], identified by name and date of birth, is a 51 y.o. male with a diagnosis of Diabetes:  . He is here primarily for diabetes education and continued weight loss. He is pleased with weight loss from 294 pounds last year to 274 pounds today. See EPIC Notes for more details of this visit.  ASSESSMENT  Height 5\' 8"  (1.727 m), weight 274 lb (124.3 kg). Body mass index is 41.66 kg/m.   Individualized Plan for Diabetes Self-Management Training:   Learning Objective:  Patient will have a greater understanding of diabetes self-management. Patient education plan is to attend individual and/or group sessions per assessed needs and concerns.   Plan:   Patient Instructions  Plan: Be aware of the portion size.  Balance the carbohydrates throughout the day. Aim for 3 carbohydrate servings (=45 grams) with each meal +/- 1 either way Aim for 0-1 carbohydrate servings (0-15 grams) for each snack if hungry Continue including lean meat in moderate portion sizes with your meals Remember, vegetables are free foods Use little fat. Consider increasing your activity level by walking either outside or inside, depending on the weather If you start to have low Blood Sugar with your weight loss and increased activity level, call your Dr to get your insulin dose decreased.   Expected Outcomes:     Education material provided: Living Well with Diabetes, Meal plan card and Carbohydrate counting sheet  If problems or questions, patient to contact team via:  Phone and Email  Future DSME appointment:

## 2017-10-01 ENCOUNTER — Encounter: Payer: Self-pay | Admitting: Physician Assistant

## 2017-10-12 ENCOUNTER — Telehealth: Payer: Self-pay | Admitting: Emergency Medicine

## 2017-10-12 ENCOUNTER — Ambulatory Visit (INDEPENDENT_AMBULATORY_CARE_PROVIDER_SITE_OTHER): Payer: Medicare HMO | Admitting: Physician Assistant

## 2017-10-12 ENCOUNTER — Encounter: Payer: Self-pay | Admitting: Internal Medicine

## 2017-10-12 ENCOUNTER — Encounter: Payer: Self-pay | Admitting: Physician Assistant

## 2017-10-12 VITALS — BP 114/76 | HR 68 | Ht 68.0 in | Wt 273.0 lb

## 2017-10-12 DIAGNOSIS — Z1211 Encounter for screening for malignant neoplasm of colon: Secondary | ICD-10-CM

## 2017-10-12 DIAGNOSIS — Z7901 Long term (current) use of anticoagulants: Secondary | ICD-10-CM | POA: Diagnosis not present

## 2017-10-12 MED ORDER — NA SULFATE-K SULFATE-MG SULF 17.5-3.13-1.6 GM/177ML PO SOLN: 1.0000 | ORAL | 0 refills | Status: DC

## 2017-10-12 NOTE — Progress Notes (Signed)
Initial assessment and plan noted

## 2017-10-12 NOTE — Patient Instructions (Addendum)
You have been scheduled for a colonoscopy. Please follow written instructions given to you at your visit today.  Please pick up your prep supplies at the pharmacy within the next 1-3 days. If you use inhalers (even only as needed), please bring them with you on the day of your procedure. Your physician has requested that you go to www.startemmi.com and enter the access code given to you at your visit today. This web site gives a general overview about your procedure. However, you should still follow specific instructions given to you by our office regarding your preparation for the procedure.  You will be contacted by our office prior to your procedure for directions on holding your Eliquis.  If you do not hear from our office 1 week prior to your scheduled procedure, please call [phone] to discuss.   _  _   ORAL DIABETIC MEDICATION INSTRUCTIONS  The day before your procedure:  Take your diabetic pill as you do normally  The day of your procedure:  Do not take your diabetic pill   We will check your blood sugar levels during the admission process and again in Recovery before discharging you home  ______________________________________________________________________  _  _   INSULIN (LONG ACTING) MEDICATION INSTRUCTIONS (Lantus, NPH, 70/30, Humulin, Novolin-N, Levemir, Toujeo )   The day before your procedure:  Take  your regular evening dose    The day of your procedure:  Do not take your morning dose   _  _   INSULIN (SHORT ACTING) MEDICATION INSTRUCTIONS (Regular, Humulog, Novolog, Apidra, Novolin, Humulin)   The day before your procedure:  Do not take your evening dose   The day of your procedure:  Do not take your morning dose  ______________________________________________________________________                _ _ OTHER NON-INSULIN INJECTABLE MEDICATIONS         (Tanzeum, Trulicity, Byetta, Victoza, Bydureon, SymlinPen)  Hold the am of the  procedure

## 2017-10-12 NOTE — Progress Notes (Signed)
Chief Complaint: Consult for screening colonoscopy in a patient on chronic anticoagulation  HPI: Mr. Jarrett is a 51 year old African-American male with a past medical history as below including DVT, PVD and pulmonary embolism maintained on Eliquis, who was referred to me by Vonna Drafts, FNP for consult of a screening colonoscopy.    Today, the patient presents to clinic and explains that he is due for a colonoscopy.  The patient tells me that recently he has had some constipation or "feelings of constipation", and has been using an herbal supplement called " colon cleanse".  Patient tells me that with this medication he has been able to have regular bowel movements and feels better.  He tells me he plans to stop this when he is prepping for his colonoscopy.    Patient does describe being maintained on Eliquis for blood clots in the past, he does not remember ever having to stop this medication in the past.   He denies fever, chills, blood in stool, melena, weight loss, fatigue, anorexia, nausea, vomiting, change in bowel habits or abdominal pain.  Past Medical History:  Diagnosis Date  . Anemia   . Deep vein thrombosis ([place]) 07/2013, 10/2014, 03/16/2015   LLE (studies at [facility])  . Diabetes mellitus   . Hypertension   . Obesity   . Peripheral vascular disease ([place])    6 yrs ago, DVT in Lt knee/ groin  . Pulmonary embolism ([facility])    hx of x 3 per patient; most recent 02/2014  . Sleep apnea     Past Surgical History:  Procedure Laterality Date  . IVC filter   07/2014   . TONSILLECTOMY    . tubes removd from ears      Current Outpatient Medications  Medication Sig Dispense Refill  . allopurinol (ZYLOPRIM) 100 MG tablet Take 100 mg by mouth daily.    Marland Kitchen apixaban (ELIQUIS) 2.5 MG TABS tablet Take 2.5 mg by mouth 2 (two) times daily.    Marland Kitchen atorvastatin (LIPITOR) 40 MG tablet Take 40 mg by mouth at bedtime.     . Cholecalciferol (VITAMIN D3) 5000 UNITS CAPS Take 5,000 Units by  mouth at bedtime.     . clomiPHENE (CLOMID) 50 MG tablet Take 50 mg by mouth 3 (three) times a week.    . Coenzyme Q10 (CO Q 10) 100 MG CAPS Take 1 capsule by mouth at bedtime.     . Empagliflozin-Metformin HCl (SYNJARDY) 12.04-999 MG TABS Take 1 tablet by mouth 2 (two) times daily.    . ferrous fumarate (HEMOCYTE - 106 MG FE) 325 (106 FE) MG TABS tablet Take 1 tablet by mouth at bedtime.     . furosemide (LASIX) 40 MG tablet Take 40 mg by mouth every morning.     . insulin aspart (NOVOLOG FLEXPEN) 100 UNIT/ML FlexPen Inject 12-15 Units into the skin 3 (three) times daily with meals. 15 units three times daily with meal unless meal is small then inject 12 units    . Insulin Glargine (LANTUS SOLOSTAR) 100 UNIT/ML Solostar Pen Inject 100 Units into the skin daily at 10 pm.     . Liraglutide (VICTOZA) 18 MG/3ML SOPN Inject 18 mg into the skin every morning.    Marland Kitchen lisinopril (PRINIVIL,ZESTRIL) 2.5 MG tablet Take 2.5 mg by mouth daily.    . meloxicam (MOBIC) 15 MG tablet Take 15 mg by mouth every morning.    . metoprolol succinate (TOPROL-XL) 50 MG 24 hr tablet Take 50 mg by  mouth at bedtime. Take with or immediately following a meal.    . niacin 500 MG CR capsule Take 500 mg by mouth at bedtime.     . nitroGLYCERIN (NITROSTAT) 0.4 MG SL tablet Place 0.4 mg under the tongue every 5 (five) minutes as needed for chest pain.    Marland Kitchen oxyCODONE-acetaminophen (ROXICET) 5-325 MG tablet Take 1-2 tablets by mouth every 6 (six) hours as needed for moderate pain or severe pain. Post-operatively 30 tablet 0  . potassium chloride (K-DUR) 10 MEQ tablet Take 10 mEq by mouth at bedtime.     . pregabalin (LYRICA) 150 MG capsule Take 150 mg by mouth 2 (two) times daily.    Marland Kitchen senna-docusate (SENOKOT-S) 8.6-50 MG tablet Take 1 tablet by mouth 2 (two) times daily. While taking pain meds to prevent constipation 30 tablet 0  . sitaGLIPtin-metformin (JANUMET) 50-500 MG per tablet Take 1 tablet by mouth daily after breakfast.    .  tamsulosin (FLOMAX) 0.4 MG CAPS capsule Take 0.4 mg by mouth daily after breakfast.     No current facility-administered medications for this visit.     Allergies as of 10/12/2017 - Review Complete 10/12/2017  Allergen Reaction Noted  . Penicillins Hives 03/20/2012    Family History  Problem Relation Age of Onset  . Kidney disease Father   . Diabetes Father     Social History   Socioeconomic History  . Marital status: Single    Spouse name: Not on file  . Number of children: Not on file  . Years of education: Not on file  . Highest education level: Not on file  Social Needs  . Financial resource strain: Not on file  . Food insecurity - worry: Not on file  . Food insecurity - inability: Not on file  . Transportation needs - medical: Not on file  . Transportation needs - non-medical: Not on file  Occupational History  . Not on file  Tobacco Use  . Smoking status: Never Smoker  . Smokeless tobacco: Never Used  Substance and Sexual Activity  . Alcohol use: Yes    Comment: rare  . Drug use: No  . Sexual activity: Yes    Birth control/protection: Condom  Other Topics Concern  . Not on file  Social History Narrative  . Not on file    Review of Systems:    Constitutional: No weight loss, fever or chills Skin: No rash  Cardiovascular: No chest pain  Respiratory: No SOB Gastrointestinal: See HPI and otherwise negative Genitourinary: No dysuria  Neurological: No headache Musculoskeletal: No new muscle or joint pain Hematologic: No bleeding Psychiatric: No history of depression or anxiety   Physical Exam:  Vital signs: BP 114/76   Pulse 68   Ht 5\' 8"  (1.727 m)   Wt 273 lb (123.8 kg)   BMI 41.51 kg/m   Constitutional:   Pleasant AA male appears to be in NAD, Well developed, Well nourished, alert and cooperative Head:  Normocephalic and atraumatic. Eyes:   PEERL, EOMI. No icterus. Conjunctiva pink. Ears:  Normal auditory acuity. Neck:  Supple Throat: Oral  cavity and pharynx without inflammation, swelling or lesion.  Respiratory: Respirations even and unlabored. Lungs clear to auscultation bilaterally.   No wheezes, crackles, or rhonchi.  Cardiovascular: Normal S1, S2. No MRG. Regular rate and rhythm. No peripheral edema, cyanosis or pallor.  Gastrointestinal:  Soft, nondistended, nontender. No rebound or guarding. Normal bowel sounds. No appreciable masses or hepatomegaly. Rectal:  Not performed.  Msk:  Symmetrical without gross deformities. Without edema, no deformity or joint abnormality.  Neurologic:  Alert and  oriented x4;  grossly normal neurologically.  Skin:   Dry and intact without significant lesions or rashes. Psychiatric: Cognitively impaired Demonstrates good judgement and reason without abnormal affect or behaviors.  No recent labs or imaging.  Assessment: 1.  Screening for colorectal cancer: Patient is now 51 years old and due for screening colonoscopy 2.  Chronic anticoagulation: On Eliquis for PVD and DVT as well as pulmonary embolism in the past  Plan: 1.  Scheduled patient for screening colonoscopy in [place] with Dr. Henrene Pastor as he is supervising this morning.  Discussed risks, benefits, limitations and alternatives and the patient agrees to proceed. 2.  Patient was advised to hold his Eliquis for 2 days prior to his procedure.  We will communicate with his cardiologist to ensure that holding his Eliquis is acceptable for him. 3.  Patient will return to clinic per recommendations from Dr. Henrene Pastor after time of procedure.  Ellouise Newer, PA-C [facility] 10/12/2017, 9:13 AM  Cc: Vonna Drafts, FNP

## 2017-10-16 NOTE — Telephone Encounter (Signed)
Procedure canceled by Dr. Ailene Rud office patient is not stable at this time to undergo a procedure and come of Eliquis. Pt will call back to reschedule.

## 2017-10-23 ENCOUNTER — Encounter: Payer: Medicare HMO | Admitting: Internal Medicine

## 2017-11-24 ENCOUNTER — Telehealth: Payer: Self-pay | Admitting: Vascular Surgery

## 2017-11-24 NOTE — Telephone Encounter (Signed)
Referral made by Dr. Lillia Corporal for left popliteal DVT, clear for colonoscopy.  Appt. Made for January 30 at 11:45.  Spoke with Dr. Donzetta Matters who asked that Dr. Alphonzo Grieve call him on his cell phone to discuss management.  Message left for Nyoka to give Dr. Claretha Cooper cell phone number to Dr. Alphonzo Grieve.  Appointment will be kept as scheduled until the providers talk.  Ovidio Hanger

## 2018-01-06 ENCOUNTER — Ambulatory Visit (INDEPENDENT_AMBULATORY_CARE_PROVIDER_SITE_OTHER): Payer: Medicare HMO | Admitting: Vascular Surgery

## 2018-01-06 ENCOUNTER — Encounter: Payer: Self-pay | Admitting: Vascular Surgery

## 2018-01-06 VITALS — BP 127/84 | HR 88 | Temp 97.6°F | Resp 20 | Ht 68.0 in | Wt 277.0 lb

## 2018-01-06 DIAGNOSIS — I82532 Chronic embolism and thrombosis of left popliteal vein: Secondary | ICD-10-CM

## 2018-01-06 NOTE — Progress Notes (Signed)
Patient ID: Travis Rollins, male   DOB: September 30, 1966, 52 y.o.   MRN: [id number]  Reason for Consult: New Patient (Initial Visit) (eval L LE DVT)   Referred by Javier Docker, MD  Subjective:     HPI:  Travis Rollins is a 52 y.o. 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  He also has a recent DVT at the level of the left popliteal vein.  He did not have any pain until this time now has pain in the middle the night.  He has had chronic longtime swelling of the left lower extremity that he states has been for several years.  He is also concerned that he has what he considers bruising of his bilateral lower extremities left greater than right.  He does continue to walk this helps with his pain but also worsens his swelling.  He has never had ulceration of his lower extremities.  He does not wear compression stockings.  He currently takes Eliquis.  Chief complaint today is heaviness and leg swelling mostly of his left lower extremity.  He does not have any varicose veins.  Past Medical History:  Diagnosis Date  . Anemia   . Deep vein thrombosis ([place]) 07/2013, 10/2014, 03/16/2015   LLE (studies at [facility])  . Diabetes mellitus   . Hypertension   . Obesity   . Peripheral vascular disease ([place])    6 yrs ago, DVT in Lt knee/ groin  . Pulmonary embolism ([facility])    hx of x 3 per patient; most recent 02/2014  . Sleep apnea    Family History  Problem Relation Age of Onset  . Kidney disease Father   . Diabetes Father    Past Surgical History:  Procedure Laterality Date  . CARDIAC CATHETERIZATION N/A 08/10/2015   Procedure: Left Heart Cath and Coronary Angiography;  Surgeon: Adrian Prows, MD;  Location: [facility];  Service: Cardiovascular;  Laterality: N/A;  . CYSTOSCOPY WITH RETROGRADE PYELOGRAM, URETEROSCOPY AND STENT PLACEMENT Bilateral 01/03/2015   Procedure: CYSTOSCOPY WITH RETROGRADE PYELOGRAM, RIGHT URETEROSCOPY AND RIGHT  STENT PLACEMENT;  Surgeon: Alexis Frock, MD;  Location: [facility];  Service: Urology;  Laterality: Bilateral;  . CYSTOSCOPY WITH RETROGRADE PYELOGRAM, URETEROSCOPY AND STENT PLACEMENT Right 03/12/2016   Procedure: CYSTOSCOPY WITH RETROGRADE PYELOGRAM, URETEROSCOPY AND STENT PLACEMENT;  Surgeon: Alexis Frock, MD;  Location: [facility];  Service: Urology;  Laterality: Right;  . HOLMIUM LASER APPLICATION Right 03/16/8118   Procedure: HOLMIUM LASER APPLICATION;  Surgeon: Alexis Frock, MD;  Location: [facility];  Service: Urology;  Laterality: Right;  . HOLMIUM LASER APPLICATION Right 12/12/7827   Procedure: HOLMIUM LASER APPLICATION;  Surgeon: Alexis Frock, MD;  Location: [facility];  Service: Urology;  Laterality: Right;  . IVC filter   07/2014   . TONSILLECTOMY    . tubes removd from ears      Short Social History:  Social History   Tobacco Use  . Smoking status: Never Smoker  . Smokeless tobacco: Never Used  Substance Use Topics  . Alcohol use: Yes    Comment: rare    Allergies  Allergen Reactions  . Penicillins Hives    Has patient had a PCN reaction causing immediate rash, facial/tongue/throat swelling, SOB or lightheadedness with hypotension: No Has patient had a PCN reaction causing severe rash involving mucus membranes or skin necrosis: No Has patient had a PCN reaction that required hospitalization No Has patient  had a PCN reaction occurring within the last 10 years: No If all of the above answers are "NO", then may proceed with Cephalosporin use.    Current Outpatient Medications  Medication Sig Dispense Refill  . allopurinol (ZYLOPRIM) 100 MG tablet Take 100 mg by mouth daily.    Marland Kitchen apixaban (ELIQUIS) 2.5 MG TABS tablet Take 2.5 mg by mouth 2 (two) times daily.    Marland Kitchen atorvastatin (LIPITOR) 40 MG tablet Take 40 mg by mouth at bedtime.     . Cholecalciferol (VITAMIN D3) 5000 UNITS CAPS Take 5,000 Units by mouth at bedtime.     . clomiPHENE (CLOMID) 50 MG tablet Take 50 mg by mouth 3  (three) times a week.    . Coenzyme Q10 (CO Q 10) 100 MG CAPS Take 1 capsule by mouth at bedtime.     . Empagliflozin-Metformin HCl (SYNJARDY) 12.04-999 MG TABS Take 1 tablet by mouth 2 (two) times daily.    . ferrous fumarate (HEMOCYTE - 106 MG FE) 325 (106 FE) MG TABS tablet Take 1 tablet by mouth at bedtime.     . furosemide (LASIX) 40 MG tablet Take 40 mg by mouth every morning.     . insulin aspart (NOVOLOG FLEXPEN) 100 UNIT/ML FlexPen Inject 12-15 Units into the skin 3 (three) times daily with meals. 15 units three times daily with meal unless meal is small then inject 12 units    . Insulin Glargine (LANTUS SOLOSTAR) 100 UNIT/ML Solostar Pen Inject 100 Units into the skin daily at 10 pm.     . Liraglutide (VICTOZA) 18 MG/3ML SOPN Inject 18 mg into the skin every morning.    Marland Kitchen lisinopril (PRINIVIL,ZESTRIL) 2.5 MG tablet Take 2.5 mg by mouth daily.    . meloxicam (MOBIC) 15 MG tablet Take 15 mg by mouth every morning.    . metoprolol succinate (TOPROL-XL) 50 MG 24 hr tablet Take 50 mg by mouth at bedtime. Take with or immediately following a meal.    . Na Sulfate-K Sulfate-Mg Sulf (SUPREP BOWEL PREP KIT) 17.5-3.13-1.6 GM/177ML SOLN Take 1 kit as directed by mouth. 324 mL 0  . niacin 500 MG CR capsule Take 500 mg by mouth at bedtime.     . nitroGLYCERIN (NITROSTAT) 0.4 MG SL tablet Place 0.4 mg under the tongue every 5 (five) minutes as needed for chest pain.    Marland Kitchen oxyCODONE-acetaminophen (ROXICET) 5-325 MG tablet Take 1-2 tablets by mouth every 6 (six) hours as needed for moderate pain or severe pain. Post-operatively 30 tablet 0  . potassium chloride (K-DUR) 10 MEQ tablet Take 10 mEq by mouth at bedtime.     . pregabalin (LYRICA) 150 MG capsule Take 150 mg by mouth 2 (two) times daily.    Marland Kitchen senna-docusate (SENOKOT-S) 8.6-50 MG tablet Take 1 tablet by mouth 2 (two) times daily. While taking pain meds to prevent constipation 30 tablet 0  . sitaGLIPtin-metformin (JANUMET) 50-500 MG per tablet Take  1 tablet by mouth daily after breakfast.    . tamsulosin (FLOMAX) 0.4 MG CAPS capsule Take 0.4 mg by mouth daily after breakfast.     No current facility-administered medications for this visit.     Review of Systems  Constitutional:  Constitutional negative. HENT: HENT negative.  Eyes: Eyes negative.  Respiratory: Respiratory negative.  Cardiovascular: Positive for leg swelling.  GI: Gastrointestinal negative.  Musculoskeletal: Musculoskeletal negative.  Skin: Skin negative.  Neurological: Neurological negative. Hematologic: Hematologic/lymphatic negative.  Psychiatric: Psychiatric negative.  Objective:  Objective   Vitals:   01/06/18 1213  Weight: 277 lb (125.6 kg)   Body mass index is 42.12 kg/m.  Physical Exam  Constitutional: He appears well-developed.  HENT:  Head: Normocephalic.  Eyes: Pupils are equal, round, and reactive to light.  Neck: Normal range of motion.  Cardiovascular: Normal rate.  Pulses:      Radial pulses are 2+ on the right side, and 2+ on the left side.       Popliteal pulses are 2+ on the right side, and 2+ on the left side.       Dorsalis pedis pulses are 2+ on the right side, and 2+ on the left side.       Posterior tibial pulses are 2+ on the right side, and 2+ on the left side.  Pulmonary/Chest: Effort normal.  Abdominal: Soft.  Musculoskeletal: He exhibits edema.  Skin:  Thickened and dark discoloration of left leg more than right  Psychiatric: He has a normal mood and affect. His behavior is normal. Judgment and thought content normal.    Data: TECHNIQUE: The veins of both lower extremities were interrogated from the visible common femoral vein to the distal popliteal vein. The junction of the greater saphenous with the common femoral vein as well as the posterior tibial veins were evaluated.  Gray scale, color, spectral, and doppler sonography was utilized and analyzed.  COMPARISON: None.  INDICATION: bilateral leg swelling,  L>R, h/o DVT,  FINDINGS: The study is positive for nonocclusive DVT in the left popliteal vein. Left calf edema noted.  Right lower extremity is normal.  INCIDENTAL FINDINGS: None   IMPRESSION: DVT in the left popliteal vein, with left calf edema.  Negative for DVT in the right lower extremity.  Patient has a history of previous DVT in this location however those images are unavailable for comparison.     Assessment/Plan:     52 year old male with extensive DVT history also has an IVC filter in place currently on Eliquis.  Given that the filter has been in for over 3 years at this point I would probably leave it since it is not causing problems.  With respect to his DVT history will probably be prudent that he stays on anticoagulation long-term as long as he can tolerate.  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  He does not have any varicosities and his DVT most recently was in the popliteal area and so interventional treatment is not merited for central venous disease.  However I think he may have a component of reflux.  Most importantly we will get him in compression stockings thigh-high at this time and he will continue anticoagulation and I have also encouraged ambulation is much as tolerated.  We will get him to follow-up in a few months with bilateral lower extremity venous reflux studies to evaluate his deep and superficial veins for reflux and possibly we can help him with this respect.  Should he have questions in the interim I can certainly see him sooner.     Travis Sandy MD [facility]

## 2018-01-07 ENCOUNTER — Other Ambulatory Visit: Payer: Self-pay

## 2018-01-07 DIAGNOSIS — I878 Other specified disorders of veins: Secondary | ICD-10-CM

## 2018-03-05 ENCOUNTER — Ambulatory Visit (INDEPENDENT_AMBULATORY_CARE_PROVIDER_SITE_OTHER): Payer: Medicare HMO | Admitting: Vascular Surgery

## 2018-03-05 ENCOUNTER — Ambulatory Visit (HOSPITAL_COMMUNITY)
Admission: RE | Admit: 2018-03-05 | Discharge: 2018-03-05 | Disposition: A | Discharge status: Home / Self Care | Payer: Medicare HMO | Source: Ambulatory Visit | Attending: Vascular Surgery | Admitting: Vascular Surgery

## 2018-03-05 ENCOUNTER — Other Ambulatory Visit: Payer: Self-pay

## 2018-03-05 ENCOUNTER — Encounter: Payer: Self-pay | Admitting: Vascular Surgery

## 2018-03-05 VITALS — BP 134/79 | HR 75 | Temp 98.2°F | Resp 20 | Ht 68.0 in | Wt 273.0 lb

## 2018-03-05 DIAGNOSIS — I878 Other specified disorders of veins: Secondary | ICD-10-CM | POA: Insufficient documentation

## 2018-03-05 DIAGNOSIS — I872 Venous insufficiency (chronic) (peripheral): Secondary | ICD-10-CM

## 2018-03-05 DIAGNOSIS — I82532 Chronic embolism and thrombosis of left popliteal vein: Secondary | ICD-10-CM | POA: Diagnosis not present

## 2018-03-05 NOTE — Progress Notes (Signed)
Patient ID: Travis Rollins, male   DOB: Aug 27, 1966, 52 y.o.   MRN: [id number]  Reason for Consult: Follow-up (2 mth f/u LE reflux bilat. )   Referred by Javier Docker, MD  Subjective:     HPI:  Travis Rollins is a 52 y.o. male whom I recently saw for C4 venous disease.  At that time I prescribed him thigh-high compression stockings.  He is now walking significant time and is worn those out but is wearing them religiously.  He is also taking his Eliquis which she will be on lifelong and has an IVC filter in place.  His swelling is the same and he has similar discomfort in his left lower extremity.  He presents with venous reflux studies today.  Past Medical History:  Diagnosis Date  . Anemia   . Deep vein thrombosis ([place]) 07/2013, 10/2014, 03/16/2015   LLE (studies at [facility])  . Diabetes mellitus   . Hypertension   . Obesity   . Peripheral vascular disease ([place])    6 yrs ago, DVT in Lt knee/ groin  . Pulmonary embolism ([facility])    hx of x 3 per patient; most recent 02/2014  . Sleep apnea    Family History  Problem Relation Age of Onset  . Kidney disease Father   . Diabetes Father    Past Surgical History:  Procedure Laterality Date  . CARDIAC CATHETERIZATION N/A 08/10/2015   Procedure: Left Heart Cath and Coronary Angiography;  Surgeon: Adrian Prows, MD;  Location: [facility];  Service: Cardiovascular;  Laterality: N/A;  . CYSTOSCOPY WITH RETROGRADE PYELOGRAM, URETEROSCOPY AND STENT PLACEMENT Bilateral 01/03/2015   Procedure: CYSTOSCOPY WITH RETROGRADE PYELOGRAM, RIGHT URETEROSCOPY AND RIGHT STENT PLACEMENT;  Surgeon: Alexis Frock, MD;  [facility];  Service: Urology;  Laterality: Bilateral;  . CYSTOSCOPY WITH RETROGRADE PYELOGRAM, URETEROSCOPY AND STENT PLACEMENT Right 03/12/2016   Procedure: CYSTOSCOPY WITH RETROGRADE PYELOGRAM, URETEROSCOPY AND STENT PLACEMENT;  Surgeon: Alexis Frock, MD;  [facility];  Service: Urology;  Laterality: Right;  . HOLMIUM  LASER APPLICATION Right 4/80/1655   Procedure: HOLMIUM LASER APPLICATION;  Surgeon: Alexis Frock, MD;  [facility];  Service: Urology;  Laterality: Right;  . HOLMIUM LASER APPLICATION Right 02/11/4826   Procedure: HOLMIUM LASER APPLICATION;  Surgeon: Alexis Frock, MD;  [facility];  Service: Urology;  Laterality: Right;  . IVC filter   07/2014   . TONSILLECTOMY    . tubes removd from ears      Short Social History:  Social History   Tobacco Use  . Smoking status: Never Smoker  . Smokeless tobacco: Never Used  Substance Use Topics  . Alcohol use: Yes    Comment: rare    Allergies  Allergen Reactions  . Penicillins Hives    Has patient had a PCN reaction causing immediate rash, facial/tongue/throat swelling, SOB or lightheadedness with hypotension: No Has patient had a PCN reaction causing severe rash involving mucus membranes or skin necrosis: No Has patient had a PCN reaction that required hospitalization No Has patient had a PCN reaction occurring within the last 10 years: No If all of the above answers are "NO", then may proceed with Cephalosporin use.    Current Outpatient Medications  Medication Sig Dispense Refill  . allopurinol (ZYLOPRIM) 100 MG tablet Take 100 mg by mouth daily.    Marland Kitchen apixaban (ELIQUIS) 2.5 MG TABS tablet Take 2.5 mg by mouth 2 (two) times daily.    Marland Kitchen atorvastatin (LIPITOR)  40 MG tablet Take 40 mg by mouth at bedtime.     . Cholecalciferol (VITAMIN D3) 5000 UNITS CAPS Take 5,000 Units by mouth at bedtime.     . clomiPHENE (CLOMID) 50 MG tablet Take 50 mg by mouth 3 (three) times a week.    . Coenzyme Q10 (CO Q 10) 100 MG CAPS Take 1 capsule by mouth at bedtime.     . Empagliflozin-Metformin HCl (SYNJARDY) 12.04-999 MG TABS Take 1 tablet by mouth 2 (two) times daily.    . ferrous fumarate (HEMOCYTE - 106 MG FE) 325 (106 FE) MG TABS tablet Take 1 tablet by mouth at bedtime.     . furosemide (LASIX) 40 MG tablet Take 40 mg by mouth every morning.      . insulin aspart (NOVOLOG FLEXPEN) 100 UNIT/ML FlexPen Inject 12-15 Units into the skin 3 (three) times daily with meals. 15 units three times daily with meal unless meal is small then inject 12 units    . Insulin Glargine (LANTUS SOLOSTAR) 100 UNIT/ML Solostar Pen Inject 100 Units into the skin daily at 10 pm.     . Liraglutide (VICTOZA) 18 MG/3ML SOPN Inject 18 mg into the skin every morning.    Marland Kitchen lisinopril (PRINIVIL,ZESTRIL) 2.5 MG tablet Take 2.5 mg by mouth daily.    . meloxicam (MOBIC) 15 MG tablet Take 15 mg by mouth every morning.    . metoprolol succinate (TOPROL-XL) 50 MG 24 hr tablet Take 50 mg by mouth at bedtime. Take with or immediately following a meal.    . Na Sulfate-K Sulfate-Mg Sulf (SUPREP BOWEL PREP KIT) 17.5-3.13-1.6 GM/177ML SOLN Take 1 kit as directed by mouth. 324 mL 0  . niacin 500 MG CR capsule Take 500 mg by mouth at bedtime.     . nitroGLYCERIN (NITROSTAT) 0.4 MG SL tablet Place 0.4 mg under the tongue every 5 (five) minutes as needed for chest pain.    Marland Kitchen oxyCODONE-acetaminophen (ROXICET) 5-325 MG tablet Take 1-2 tablets by mouth every 6 (six) hours as needed for moderate pain or severe pain. Post-operatively 30 tablet 0  . potassium chloride (K-DUR) 10 MEQ tablet Take 10 mEq by mouth at bedtime.     . pregabalin (LYRICA) 150 MG capsule Take 150 mg by mouth 2 (two) times daily.    Marland Kitchen senna-docusate (SENOKOT-S) 8.6-50 MG tablet Take 1 tablet by mouth 2 (two) times daily. While taking pain meds to prevent constipation 30 tablet 0  . sitaGLIPtin-metformin (JANUMET) 50-500 MG per tablet Take 1 tablet by mouth daily after breakfast.    . tamsulosin (FLOMAX) 0.4 MG CAPS capsule Take 0.4 mg by mouth daily after breakfast.     No current facility-administered medications for this visit.     Review of Systems  Constitutional:  Constitutional negative. HENT: HENT negative.  Eyes: Eyes negative.  Cardiovascular: Positive for leg swelling.  GI: Gastrointestinal negative.   Musculoskeletal: Positive for leg pain.  Neurological: Neurological negative. Hematologic: Hematologic/lymphatic negative.  Psychiatric: Psychiatric negative.        Objective:  Objective   Vitals:   03/05/18 1150  BP: 134/79  Pulse: 75  Resp: 20  Temp: 98.2 F (36.8 C)  TempSrc: Oral  SpO2: 100%  Weight: 273 lb (123.8 kg)  Height: _0  (1.727 m)   Body mass index is 41.51 kg/m.  Physical Exam  Constitutional: He appears well-developed.  HENT:  Head: Normocephalic.  Neck: Normal range of motion.  Cardiovascular: Normal rate.  Abdominal: Soft.  Musculoskeletal:  Normal range of motion. He exhibits edema.  Neurological: He is alert.  Skin:  changes of left leg c4 venous disease  Psychiatric: He has a normal mood and affect. His behavior is normal. Judgment and thought content normal.    Data: I have independently interpreted his venous reflux studies which demonstrate reflux in his bilateral greater saphenous veins.  The reflux in the left greater saphenous vein is as high as 4283 ms and also greater than 2000 at the small saphenous vein on the left.  The saphenofemoral junction on the left is 0.75 cm and on the right is 0.7 cm.     Assessment/Plan:     52 year old male with left lower extremity swelling having history of DVT now on long-term anticoagulation with a known IVC filter that does not appear to be causing issues.  He has C4 venous disease left greater than right and has matching venous reflux studies.  He has been in compression stockings now for 2 months and he is wearing it religiously.  I will have him see 1 of my partners for consideration of intervention for his reflux disease in the near future.  He will get new thigh-high compression stockings as he states he has worn his otherwise out.  I have encouraged him to continue walking.  I will see him on an as-needed basis after he sees Dr. Donnetta Hutching or Dr. Kellie Simmering.  Shatira Dobosz C. Donzetta Matters, MD [facility] [place] Office: [phone] Pager: [phone]

## 2018-04-06 ENCOUNTER — Encounter: Payer: Self-pay | Admitting: Vascular Surgery

## 2018-04-06 ENCOUNTER — Ambulatory Visit (INDEPENDENT_AMBULATORY_CARE_PROVIDER_SITE_OTHER): Payer: Medicare HMO | Admitting: Vascular Surgery

## 2018-04-06 VITALS — BP 109/75 | HR 82 | Temp 97.0°F | Resp 16 | Ht 67.0 in | Wt 275.3 lb

## 2018-04-06 DIAGNOSIS — I82532 Chronic embolism and thrombosis of left popliteal vein: Secondary | ICD-10-CM | POA: Insufficient documentation

## 2018-04-06 DIAGNOSIS — I83893 Varicose veins of bilateral lower extremities with other complications: Secondary | ICD-10-CM | POA: Diagnosis not present

## 2018-04-06 DIAGNOSIS — I83892 Varicose veins of left lower extremities with other complications: Secondary | ICD-10-CM | POA: Insufficient documentation

## 2018-04-06 NOTE — Progress Notes (Signed)
Subjective:     Patient ID: Travis Rollins, male   DOB: 18-Dec-1965, 52 y.o.   MRN: [id number]  HPI This 52 year old male has been seen in the past by Dr. Servando Snare for severe skin changes due to chronic venous insufficiency with reflux.  He does have a remote history of left popliteal DVT and also has had pulmonary emboli in the past.  He is on chronic anticoagulation and has an IVC filter in place.  He takes Eliquis 2.5 mg twice daily.  He complains of persistent pain and swelling in the left leg despite long-leg elastic compression stockings 20-30 mm gradient as well as elevation.  He has no significant symptoms in the contralateral right leg.  He would like treatment to improve the swelling and to stabilize the skin changes in the left leg.  Past Medical History:  Diagnosis Date  . Anemia   . Deep vein thrombosis ([place]) 07/2013, 10/2014, 03/16/2015   LLE (studies at [facility])  . Diabetes mellitus   . Hypertension   . Obesity   . Peripheral vascular disease ([place])    6 yrs ago, DVT in Lt knee/ groin  . Pulmonary embolism ([facility])    hx of x 3 per patient; most recent 02/2014  . Sleep apnea     Social History   Tobacco Use  . Smoking status: Never Smoker  . Smokeless tobacco: Never Used  Substance Use Topics  . Alcohol use: Yes    Comment: rare    Family History  Problem Relation Age of Onset  . Kidney disease Father   . Diabetes Father     Allergies  Allergen Reactions  . Penicillins Hives    Has patient had a PCN reaction causing immediate rash, facial/tongue/throat swelling, SOB or lightheadedness with hypotension: No Has patient had a PCN reaction causing severe rash involving mucus membranes or skin necrosis: No Has patient had a PCN reaction that required hospitalization No Has patient had a PCN reaction occurring within the last 10 years: No If all of the above answers are "NO", then may proceed with Cephalosporin use.     Current Outpatient Medications:  .   allopurinol (ZYLOPRIM) 100 MG tablet, Take 100 mg by mouth daily., Disp: , Rfl:  .  apixaban (ELIQUIS) 2.5 MG TABS tablet, Take 2.5 mg by mouth 2 (two) times daily., Disp: , Rfl:  .  atorvastatin (LIPITOR) 40 MG tablet, Take 40 mg by mouth at bedtime. , Disp: , Rfl:  .  Cholecalciferol (VITAMIN D3) 5000 UNITS CAPS, Take 5,000 Units by mouth at bedtime. , Disp: , Rfl:  .  clomiPHENE (CLOMID) 50 MG tablet, Take 50 mg by mouth 3 (three) times a week., Disp: , Rfl:  .  Coenzyme Q10 (CO Q 10) 100 MG CAPS, Take 1 capsule by mouth at bedtime. , Disp: , Rfl:  .  Empagliflozin-Metformin HCl (SYNJARDY) 12.04-999 MG TABS, Take 1 tablet by mouth 2 (two) times daily., Disp: , Rfl:  .  ferrous fumarate (HEMOCYTE - 106 MG FE) 325 (106 FE) MG TABS tablet, Take 1 tablet by mouth at bedtime. , Disp: , Rfl:  .  furosemide (LASIX) 40 MG tablet, Take 40 mg by mouth every morning. , Disp: , Rfl:  .  insulin aspart (NOVOLOG FLEXPEN) 100 UNIT/ML FlexPen, Inject 12-15 Units into the skin 3 (three) times daily with meals. 15 units three times daily with meal unless meal is small then inject 12 units, Disp: , Rfl:  .  Insulin  Glargine (LANTUS SOLOSTAR) 100 UNIT/ML Solostar Pen, Inject 100 Units into the skin daily at 10 pm. , Disp: , Rfl:  .  Liraglutide (VICTOZA) 18 MG/3ML SOPN, Inject 18 mg into the skin every morning., Disp: , Rfl:  .  lisinopril (PRINIVIL,ZESTRIL) 2.5 MG tablet, Take 2.5 mg by mouth daily., Disp: , Rfl:  .  meloxicam (MOBIC) 15 MG tablet, Take 15 mg by mouth every morning., Disp: , Rfl:  .  metoprolol succinate (TOPROL-XL) 50 MG 24 hr tablet, Take 50 mg by mouth at bedtime. Take with or immediately following a meal., Disp: , Rfl:  .  Na Sulfate-K Sulfate-Mg Sulf (SUPREP BOWEL PREP KIT) 17.5-3.13-1.6 GM/177ML SOLN, Take 1 kit as directed by mouth., Disp: 324 mL, Rfl: 0 .  niacin 500 MG CR capsule, Take 500 mg by mouth at bedtime. , Disp: , Rfl:  .  nitroGLYCERIN (NITROSTAT) 0.4 MG SL tablet, Place 0.4 mg  under the tongue every 5 (five) minutes as needed for chest pain., Disp: , Rfl:  .  potassium chloride (K-DUR) 10 MEQ tablet, Take 10 mEq by mouth at bedtime. , Disp: , Rfl:  .  pregabalin (LYRICA) 150 MG capsule, Take 150 mg by mouth 2 (two) times daily., Disp: , Rfl:  .  senna-docusate (SENOKOT-S) 8.6-50 MG tablet, Take 1 tablet by mouth 2 (two) times daily. While taking pain meds to prevent constipation, Disp: 30 tablet, Rfl: 0 .  sitaGLIPtin-metformin (JANUMET) 50-500 MG per tablet, Take 1 tablet by mouth daily after breakfast., Disp: , Rfl:  .  tamsulosin (FLOMAX) 0.4 MG CAPS capsule, Take 0.4 mg by mouth daily after breakfast., Disp: , Rfl:  .  oxyCODONE-acetaminophen (ROXICET) 5-325 MG tablet, Take 1-2 tablets by mouth every 6 (six) hours as needed for moderate pain or severe pain. Post-operatively (Patient not taking: Reported on 04/06/2018), Disp: 30 tablet, Rfl: 0  Vitals:   04/06/18 1457  BP: 109/75  Pulse: 82  Resp: 16  Temp: (!) 97 F (36.1 C)  TempSrc: Oral  SpO2: 100%  Weight: 275 lb 4.8 oz (124.9 kg)  Height: 5' 7"  (1.702 m)    Body mass index is 43.12 kg/m.       Denies chest pain, dyspnea on exertion, PND, orthopnea, hemoptysis.  Does have history of pulmonary embolus as noted above at least 4 years ago and remains on chronic anticoagulation with Eliquis     Objective:   Physical Exam BP 109/75 (BP Location: Right Arm, Patient Position: Sitting, Cuff Size: Large)   Pulse 82   Temp (!) 97 F (36.1 C) (Oral)   Resp 16   Ht 5' 7"  (1.702 m)   Wt 275 lb 4.8 oz (124.9 kg)   SpO2 100%   BMI 43.12 kg/m   General morbidly obese male no apparent distress alert and oriented x3 Lungs no rhonchi or wheezing Left leg with chronic edema primarily below the knee at 1-2+ with severe hyperpigmentation lower third left leg with no active ulceration.  A few small bulging varicosities in the medial thigh and calf.  Today I reviewed the venous reflux study from March 05, 2018 and also performed a bedside SonoSite ultrasound exam independently Patient has a large left great saphenous vein with gross reflux throughout supplying some painful varicosities. 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

## 2018-04-14 ENCOUNTER — Telehealth: Payer: Self-pay | Admitting: Vascular Surgery

## 2018-04-14 ENCOUNTER — Other Ambulatory Visit: Payer: Self-pay | Admitting: *Deleted

## 2018-04-14 DIAGNOSIS — I83892 Varicose veins of left lower extremities with other complications: Secondary | ICD-10-CM

## 2018-04-14 NOTE — Telephone Encounter (Signed)
-----   Message from Rica Records, RN sent at 04/14/2018  1:16 PM EDT ----- Regarding: scheduling Please schedule post LA duplex (left leg, order in Epic) and VV FU with Dr. Kellie Simmering on 05-20 or 04-27-2018.  Thanks!!!!!

## 2018-04-14 NOTE — Telephone Encounter (Signed)
sch appt 04/27/18 1pm laser ABL f/u pt confirmed

## 2018-04-20 ENCOUNTER — Ambulatory Visit (INDEPENDENT_AMBULATORY_CARE_PROVIDER_SITE_OTHER): Payer: Medicare HMO | Admitting: Vascular Surgery

## 2018-04-20 ENCOUNTER — Encounter: Payer: Self-pay | Admitting: Vascular Surgery

## 2018-04-20 VITALS — BP 123/80 | HR 80 | Temp 97.4°F | Resp 16 | Ht 67.0 in | Wt 282.7 lb

## 2018-04-20 DIAGNOSIS — I83892 Varicose veins of left lower extremities with other complications: Secondary | ICD-10-CM | POA: Diagnosis not present

## 2018-04-20 HISTORY — PX: ENDOVENOUS ABLATION SAPHENOUS VEIN W/ LASER: SUR449

## 2018-04-20 NOTE — Progress Notes (Signed)
  Subjective:     Patient ID: Travis Rollins, male   DOB: 1966/05/21, 52 y.o.   MRN: [id number]  HPI This 52 year old male had laser ablation of the left great saphenous vein from the proximal calf to near the saphenofemoral junction performed under local tumescent anesthesia.  A total of 3083 J of energy was utilized.  He tolerated the procedure well.  Review of Systems     Objective:   Physical Exam BP 123/80 (BP Location: Left Arm, Patient Position: Sitting, Cuff Size: Large)   Pulse 80   Temp (!) 97.4 F (36.3 C) (Oral)   Resp 16   Ht 5\' 7"  (1.702 m)   Wt 282 lb 11.2 oz (128.2 kg)   SpO2 100%   BMI 44.28 kg/m        Assessment:     Well-tolerated laser ablation left great saphenous vein performed under local tumescent anesthesia    Plan:     Return in 1 week for venous duplex exam to confirm closure left great saphenous vein and this will complete patient's treatment regimen

## 2018-04-20 NOTE — Progress Notes (Signed)
Laser Ablation Procedure    Date: 04/20/2018   Travis Rollins DOB:[birth year]  Consent signed: Yes    Surgeon:  Dr. Nelda Severe. Kellie Simmering  Procedure: Laser Ablation: left Greater Saphenous Vein  BP 123/80 (BP Location: Left Arm, Patient Position: Sitting, Cuff Size: Large)   Pulse 80   Temp (!) 97.4 F (36.3 C) (Oral)   Resp 16   Ht 5\' 7"  (1.702 m)   Wt 282 lb 11.2 oz (128.2 kg)   SpO2 100%   BMI 44.28 kg/m   Tumescent Anesthesia: 450 cc 0.9% NaCl with 50 cc Lidocaine HCL 1% and 15 cc 8.4% NaHCO3  Local Anesthesia: 6 cc Lidocaine HCL and NaHCO3 (ratio 2:1)  Pulsed Mode: 15 watts, 549ms delay, 1.0 duration  Total Energy: 3083 Joules             Total Pulses: 206               Total Time: 3:26    Patient tolerated procedure well  Notes: Last dose of Eliquis taken 04-17-2018  Description of Procedure:  After marking the course of the secondary varicosities, the patient was placed on the operating table in the supine position, and the left leg was prepped and draped in sterile fashion.   Local anesthetic was administered and under ultrasound guidance the saphenous vein was accessed with a micro needle and guide wire; then the mirco puncture sheath was placed.  A guide wire was inserted saphenofemoral junction , followed by a 5 french sheath.  The position of the sheath and then the laser fiber below the junction was confirmed using the ultrasound.  Tumescent anesthesia was administered along the course of the saphenous vein using ultrasound guidance. The patient was placed in Trendelenburg position and protective laser glasses were placed on patient and staff, and the laser was fired at 15 watts continuous mode advancing 1-62mm/second for a total of 3083 joules.     Steri strip was applied to the IV insertion site and ABD pads and thigh high compression stockings were applied.  Ace wrap bandages were applied over the left calf and thigh and at the top of the saphenofemoral junction.  Blood loss was less than 15 cc.  The patient ambulated out of the operating room having tolerated the procedure well.

## 2018-04-21 ENCOUNTER — Telehealth: Payer: Self-pay | Admitting: *Deleted

## 2018-04-21 NOTE — Telephone Encounter (Signed)
Spoke with Travis Rollins who is calling on behalf of Travis Harbour FNP from [facility] regarding medication management. Travis Rollins communicated via FAX  requesting if Dr. Kellie Simmering would evaluate whether Travis Rollins needed to remain on Eliquis 2.5 mg BID.   Travis Rollins has history of left popliteal DVT and history of PE.  Dr. Kellie Simmering reviewed Travis Rollins clinical record and recommended that Travis Rollins remain on Eliquis 2.5 mg BID. Communicated Dr. Evelena Leyden recommendation to Travis Rollins at [facility].

## 2018-04-27 ENCOUNTER — Ambulatory Visit (INDEPENDENT_AMBULATORY_CARE_PROVIDER_SITE_OTHER): Payer: Medicare HMO | Admitting: Vascular Surgery

## 2018-04-27 ENCOUNTER — Ambulatory Visit (HOSPITAL_COMMUNITY)
Admission: RE | Admit: 2018-04-27 | Discharge: 2018-04-27 | Disposition: A | Discharge status: Home / Self Care | Payer: Medicare HMO | Source: Ambulatory Visit | Attending: Vascular Surgery | Admitting: Vascular Surgery

## 2018-04-27 ENCOUNTER — Other Ambulatory Visit: Payer: Self-pay

## 2018-04-27 ENCOUNTER — Encounter: Payer: Self-pay | Admitting: Vascular Surgery

## 2018-04-27 VITALS — BP 119/78 | HR 93 | Resp 18 | Ht 67.0 in | Wt 274.0 lb

## 2018-04-27 DIAGNOSIS — I83892 Varicose veins of left lower extremities with other complications: Secondary | ICD-10-CM

## 2018-04-27 DIAGNOSIS — Z9889 Other specified postprocedural states: Secondary | ICD-10-CM | POA: Diagnosis not present

## 2018-04-27 NOTE — Progress Notes (Signed)
Subjective:     Patient ID: Travis Rollins, male   DOB: 25-Dec-1965, 52 y.o.   MRN: [id number]  HPI This 52 year old male returns 1 week post-laser ablation left great saphenous vein for a large caliber saphenous vein with gross reflux and chronic edema area patient does have a remote history of DVT in the left popliteal vein. He states that the soreness in the left medial thigh is improving and that the swelling below the knee has artery improved from prior to the procedure. He has wearing his long leg elastic compression stocking and taking ibuprofen which she will discontinued today. He has resumed his anticoagulation with Eliquis.  Past Medical History:  Diagnosis Date  . Anemia   . Deep vein thrombosis ([place]) 07/2013, 10/2014, 03/16/2015   LLE (studies at [facility])  . Diabetes mellitus   . Hypertension   . Obesity   . Peripheral vascular disease ([place])    6 yrs ago, DVT in Lt knee/ groin  . Pulmonary embolism ([facility])    hx of x 3 per patient; most recent 02/2014  . Sleep apnea     Social History   Tobacco Use  . Smoking status: Never Smoker  . Smokeless tobacco: Never Used  Substance Use Topics  . Alcohol use: Yes    Comment: rare    Family History  Problem Relation Age of Onset  . Kidney disease Father   . Diabetes Father     Allergies  Allergen Reactions  . Penicillins Hives    Has patient had a PCN reaction causing immediate rash, facial/tongue/throat swelling, SOB or lightheadedness with hypotension: No Has patient had a PCN reaction causing severe rash involving mucus membranes or skin necrosis: No Has patient had a PCN reaction that required hospitalization No Has patient had a PCN reaction occurring within the last 10 years: No If all of the above answers are "NO", then may proceed with Cephalosporin use.     Current Outpatient Medications:  .  allopurinol (ZYLOPRIM) 100 MG tablet, Take 100 mg by mouth daily., Disp: , Rfl:  .  apixaban (ELIQUIS) 2.5 MG TABS  tablet, Take 2.5 mg by mouth 2 (two) times daily., Disp: , Rfl:  .  atorvastatin (LIPITOR) 40 MG tablet, Take 40 mg by mouth at bedtime. , Disp: , Rfl:  .  Cholecalciferol (VITAMIN D3) 5000 UNITS CAPS, Take 5,000 Units by mouth at bedtime. , Disp: , Rfl:  .  clomiPHENE (CLOMID) 50 MG tablet, Take 50 mg by mouth 3 (three) times a week., Disp: , Rfl:  .  Coenzyme Q10 (CO Q 10) 100 MG CAPS, Take 1 capsule by mouth at bedtime. , Disp: , Rfl:  .  Empagliflozin-Metformin HCl (SYNJARDY) 12.04-999 MG TABS, Take 1 tablet by mouth 2 (two) times daily., Disp: , Rfl:  .  ferrous fumarate (HEMOCYTE - 106 MG FE) 325 (106 FE) MG TABS tablet, Take 1 tablet by mouth at bedtime. , Disp: , Rfl:  .  furosemide (LASIX) 40 MG tablet, Take 40 mg by mouth every morning. , Disp: , Rfl:  .  insulin aspart (NOVOLOG FLEXPEN) 100 UNIT/ML FlexPen, Inject 12-15 Units into the skin 3 (three) times daily with meals. 15 units three times daily with meal unless meal is small then inject 12 units, Disp: , Rfl:  .  Insulin Glargine (LANTUS SOLOSTAR) 100 UNIT/ML Solostar Pen, Inject 100 Units into the skin daily at 10 pm. , Disp: , Rfl:  .  Liraglutide (VICTOZA) 18 MG/3ML SOPN, Inject  18 mg into the skin every morning., Disp: , Rfl:  .  lisinopril (PRINIVIL,ZESTRIL) 2.5 MG tablet, Take 2.5 mg by mouth daily., Disp: , Rfl:  .  meloxicam (MOBIC) 15 MG tablet, Take 15 mg by mouth every morning., Disp: , Rfl:  .  metoprolol succinate (TOPROL-XL) 50 MG 24 hr tablet, Take 50 mg by mouth at bedtime. Take with or immediately following a meal., Disp: , Rfl:  .  Na Sulfate-K Sulfate-Mg Sulf (SUPREP BOWEL PREP KIT) 17.5-3.13-1.6 GM/177ML SOLN, Take 1 kit as directed by mouth., Disp: 324 mL, Rfl: 0 .  niacin 500 MG CR capsule, Take 500 mg by mouth at bedtime. , Disp: , Rfl:  .  nitroGLYCERIN (NITROSTAT) 0.4 MG SL tablet, Place 0.4 mg under the tongue every 5 (five) minutes as needed for chest pain., Disp: , Rfl:  .  oxyCODONE-acetaminophen  (ROXICET) 5-325 MG tablet, Take 1-2 tablets by mouth every 6 (six) hours as needed for moderate pain or severe pain. Post-operatively, Disp: 30 tablet, Rfl: 0 .  potassium chloride (K-DUR) 10 MEQ tablet, Take 10 mEq by mouth at bedtime. , Disp: , Rfl:  .  pregabalin (LYRICA) 150 MG capsule, Take 150 mg by mouth 2 (two) times daily., Disp: , Rfl:  .  senna-docusate (SENOKOT-S) 8.6-50 MG tablet, Take 1 tablet by mouth 2 (two) times daily. While taking pain meds to prevent constipation, Disp: 30 tablet, Rfl: 0 .  sitaGLIPtin-metformin (JANUMET) 50-500 MG per tablet, Take 1 tablet by mouth daily after breakfast., Disp: , Rfl:  .  tamsulosin (FLOMAX) 0.4 MG CAPS capsule, Take 0.4 mg by mouth daily after breakfast., Disp: , Rfl:   Vitals:   04/27/18 1344  BP: 119/78  Pulse: 93  Resp: 18  SpO2: 99%  Weight: 274 lb (124.3 kg)  Height: 5' 7"  (1.702 m)    Body mass index is 42.91 kg/m.         Review of Systems Denies chest pain, dyspnea on exertion, PND, orthopnea, hemoptysis, claudication    Objective:   Physical Exam BP 119/78 (BP Location: Left Arm, Patient Position: Sitting, Cuff Size: Large)   Pulse 93   Resp 18   Ht 5' 7"  (1.702 m)   Wt 274 lb (124.3 kg)   SpO2 99%   BMI 42.91 kg/m   General morbidly obese male no apparent distress alert and oriented 3 Lungs no rhonchi or wheezing Cardiovascular regular rhythm no murmur Left leg with mild tenderness to deep palpation over great saphenous vein in thigh area up to the inguinal crease chronic hyperpigmentation lower leg unchanged 1+ edema improved from prior to procedure  Today I ordered a venous duplex exam the left leg which I reviewed and interpreted. There is no DVT. There is total closure of the left great saphenous vein up to near the saphenofemoral junction and the chronic obstruction in the left popliteal vein from an old DVT is unchanged     Assessment:     Successful laser ablation left great saphenous vein for  gross reflux with chronic swelling and severe skin changes with improvement in edema following procedure    Plan:     Patient will continue to wear elastic compression stockings on a chronic basis and return to see me on a when necessary basis

## 2018-05-11 ENCOUNTER — Ambulatory Visit (INDEPENDENT_AMBULATORY_CARE_PROVIDER_SITE_OTHER): Payer: Medicare HMO | Admitting: Orthopaedic Surgery

## 2018-05-11 ENCOUNTER — Ambulatory Visit (INDEPENDENT_AMBULATORY_CARE_PROVIDER_SITE_OTHER): Payer: Medicare HMO

## 2018-05-11 ENCOUNTER — Encounter (INDEPENDENT_AMBULATORY_CARE_PROVIDER_SITE_OTHER): Payer: Self-pay | Admitting: Orthopaedic Surgery

## 2018-05-11 VITALS — BP 128/88 | HR 71 | Ht 67.0 in | Wt 271.0 lb

## 2018-05-11 DIAGNOSIS — M25511 Pain in right shoulder: Secondary | ICD-10-CM

## 2018-05-11 DIAGNOSIS — G8929 Other chronic pain: Secondary | ICD-10-CM

## 2018-05-11 DIAGNOSIS — M7541 Impingement syndrome of right shoulder: Secondary | ICD-10-CM | POA: Diagnosis not present

## 2018-05-11 MED ORDER — BUPIVACAINE HCL 0.25 % IJ SOLN
4.0000 mL | INTRAMUSCULAR | Status: AC | PRN
  Administered 2018-05-11: 4 mL via INTRA_ARTICULAR

## 2018-05-11 MED ORDER — LIDOCAINE HCL 1 % IJ SOLN
0.5000 mL | INTRAMUSCULAR | Status: AC | PRN
  Administered 2018-05-11: .5 mL

## 2018-05-11 MED ORDER — METHYLPREDNISOLONE ACETATE 40 MG/ML IJ SUSP
40.0000 mg | INTRAMUSCULAR | Status: AC | PRN
  Administered 2018-05-11: 40 mg via INTRA_ARTICULAR

## 2018-05-11 NOTE — Progress Notes (Signed)
Office Visit Note   Patient: Travis Rollins           Date of Birth: [birth year]           MRN: [id number] Visit Date: 05/11/2018              Requested by: Javier Docker, MD [address] [place], [place] [address] PCP: Javier Docker, MD   Assessment & Plan: Visit Diagnoses:  1. Chronic right shoulder pain   2. Impingement syndrome of right shoulder     Plan: Subacromial injection performed right shoulder.  We will check him back in the has persistent problems.  X-ray results were discussed with patient pathophysiology discussed.  He will watch his sugars closely postinjection for the next 2 weeks.  Follow-Up Instructions: No follow-ups on file.   Orders:  Orders Placed This Encounter  Procedures  . XR Shoulder Right   No orders of the defined types were placed in this encounter.     Procedures: Large Joint Inj: R subacromial bursa on 05/11/2018 9:33 AM Indications: pain Details: 22 G 1.5 in needle  Arthrogram: No  Medications: 4 mL bupivacaine 0.25 %; 40 mg methylPREDNISolone acetate 40 MG/ML; 0.5 mL lidocaine 1 % Outcome: tolerated well, no immediate complications Procedure, treatment alternatives, risks and benefits explained, specific risks discussed. Consent was given by the patient. Immediately prior to procedure a time out was called to verify the correct patient, procedure, equipment, support staff and site/side marked as required. Patient was prepped and draped in the usual sterile fashion.       Clinical Data: No additional findings.   Subjective: Chief Complaint  Patient presents with  . Left Shoulder - Pain  . Right Shoulder - Pain    HPI 52 year old male returns with right greater than left shoulder pain.  He is a diabetic and had an injection left subacromial in 2017 which gave him good relief for many months.  He states he did not have any hyperglycemia after the injection.  He states the right shoulder is progressively  been painful difficulty moving his arm problems with outstretched reaching and overhead reaching.  He has x-rays brought on CD of his left shoulder that showed subacromial spurring and acromioclavicular degenerative changes.  Review of Systems 14 point review of systems updated unchanged from 11/14/2016.  Of note his morbid obesity, type 2 diabetes on insulin.  History of DVT pulmonary embolism chronic venous stasis changes.  He has a venous filter still present.  Pulmonary embolism x3.   Objective: Vital Signs: BP 128/88   Pulse 71   Ht 5\' 7"  (1.702 m)   Wt 271 lb (122.9 kg)   BMI 42.44 kg/m   Physical Exam  Constitutional: He is oriented to person, place, and time. He appears well-developed and well-nourished.  HENT:  Head: Normocephalic and atraumatic.  Eyes: Pupils are equal, round, and reactive to light. EOM are normal.  Neck: No tracheal deviation present. No thyromegaly present.  Cardiovascular: Normal rate.  Pulmonary/Chest: Effort normal. He has no wheezes.  Abdominal: Soft. Bowel sounds are normal.  Neurological: He is alert and oriented to person, place, and time.  Skin: Skin is warm and dry. Capillary refill takes less than 2 seconds.  Psychiatric: He has a normal mood and affect. His behavior is normal. Judgment and thought content normal.    Ortho Exam patient has good cervical range of motion.  Positive impingement right shoulder greater than left.  Tenderness over  both acromioclavicular joints.  Negative crossarm adduction test.  Negative drop arm test.  Elbows reach full extension.  Specialty Comments:  No specialty comments available.  Imaging: Xr Shoulder Right  Result Date: 05/11/2018 4 view x-rays right shoulder obtained.  This shows subacromial spur acromioclavicular degenerative changes negative for acute fracture.  Small inferior glenohumeral spurring.  Negative for acute changes. Impression: Acromioclavicular degenerative changes and mild glenohumeral  degenerative changes.    PMFS History: Patient Active Problem List   Diagnosis Date Noted  . Chronic deep vein thrombosis (DVT) of popliteal vein of left lower extremity ([place]) 04/06/2018  . Varicose veins of left lower extremity with complications 23/76/2831  . Atypical chest pain 08/09/2015  . Thrombocytopenia ([place]) 02/26/2014  . PE (pulmonary thromboembolism) ([place]) 02/25/2014  . Hyperkalemia 02/25/2014  . Acute respiratory failure with hypoxia ([place]) 02/25/2014  . Morbid obesity ([place]) 03/20/2012  . DM (diabetes mellitus), type 2 with renal complications ([place]) 51/76/1607  . Venous stasis 03/20/2012   Past Medical History:  Diagnosis Date  . Anemia   . Deep vein thrombosis ([place]) 07/2013, 10/2014, 03/16/2015   LLE (studies at [facility])  . Diabetes mellitus   . Hypertension   . Obesity   . Peripheral vascular disease ([place])    6 yrs ago, DVT in Lt knee/ groin  . Pulmonary embolism ([facility])    hx of x 3 per patient; most recent 02/2014  . Sleep apnea     Family History  Problem Relation Age of Onset  . Kidney disease Father   . Diabetes Father     Past Surgical History:  Procedure Laterality Date  . CARDIAC CATHETERIZATION N/A 08/10/2015   Procedure: Left Heart Cath and Coronary Angiography;  Surgeon: Adrian Prows, MD;  Location: [place] [facility];  Service: Cardiovascular;  Laterality: N/A;  . CYSTOSCOPY WITH RETROGRADE PYELOGRAM, URETEROSCOPY AND STENT PLACEMENT Bilateral 01/03/2015   Procedure: CYSTOSCOPY WITH RETROGRADE PYELOGRAM, RIGHT URETEROSCOPY AND RIGHT STENT PLACEMENT;  Surgeon: Alexis Frock, MD;  Location: [facility];  Service: Urology;  Laterality: Bilateral;  . CYSTOSCOPY WITH RETROGRADE PYELOGRAM, URETEROSCOPY AND STENT PLACEMENT Right 03/12/2016   Procedure: CYSTOSCOPY WITH RETROGRADE PYELOGRAM, URETEROSCOPY AND STENT PLACEMENT;  Surgeon: Alexis Frock, MD;  Location: [facility];  Service: Urology;  Laterality: Right;  . ENDOVENOUS ABLATION SAPHENOUS VEIN W/ LASER Left 04/20/2018     endovenous laser ablation left greater saphenous vein by Tinnie Gens MD   . HOLMIUM LASER APPLICATION Right 3/71/0626   Procedure: HOLMIUM LASER APPLICATION;  Surgeon: Alexis Frock, MD;  Location: [facility];  Service: Urology;  Laterality: Right;  . HOLMIUM LASER APPLICATION Right 08/11/8545   Procedure: HOLMIUM LASER APPLICATION;  Surgeon: Alexis Frock, MD;  Location: [facility];  Service: Urology;  Laterality: Right;  . IVC filter   07/2014   . TONSILLECTOMY    . tubes removd from ears     Social History   Occupational History  . Not on file  Tobacco Use  . Smoking status: Never Smoker  . Smokeless tobacco: Never Used  Substance and Sexual Activity  . Alcohol use: Yes    Comment: rare  . Drug use: No  . Sexual activity: Yes    Birth control/protection: Condom

## 2018-10-13 ENCOUNTER — Encounter: Payer: Self-pay | Admitting: Internal Medicine

## 2018-10-13 ENCOUNTER — Ambulatory Visit (INDEPENDENT_AMBULATORY_CARE_PROVIDER_SITE_OTHER): Payer: Medicare HMO | Admitting: Internal Medicine

## 2018-10-13 VITALS — BP 124/66 | HR 85 | Ht 67.0 in | Wt 284.0 lb

## 2018-10-13 DIAGNOSIS — K5901 Slow transit constipation: Secondary | ICD-10-CM | POA: Diagnosis not present

## 2018-10-13 DIAGNOSIS — Z7901 Long term (current) use of anticoagulants: Secondary | ICD-10-CM

## 2018-10-13 DIAGNOSIS — R140 Abdominal distension (gaseous): Secondary | ICD-10-CM

## 2018-10-13 DIAGNOSIS — Z1211 Encounter for screening for malignant neoplasm of colon: Secondary | ICD-10-CM

## 2018-10-13 MED ORDER — NA SULFATE-K SULFATE-MG SULF 17.5-3.13-1.6 GM/177ML PO SOLN: 1.0000 | Freq: Once | ORAL | 0 refills | Status: AC

## 2018-10-13 NOTE — Patient Instructions (Signed)
You have been scheduled for a colonoscopy. Please follow written instructions given to you at your visit today.  Please pick up your prep supplies at the pharmacy within the next 1-3 days. If you use inhalers (even only as needed), please bring them with you on the day of your procedure. Your physician has requested that you go to www.startemmi.com and enter the access code given to you at your visit today. This web site gives a general overview about your procedure. However, you should still follow specific instructions given to you by our office regarding your preparation for the procedure.   Per Dr. Henrene Pastor, hold your Eliquis for 2 days prior to the procedure.

## 2018-10-13 NOTE — Progress Notes (Signed)
HISTORY OF PRESENT ILLNESS:  Travis Rollins is a 52 y.o. 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  The patient presents today for screening colonoscopy.  He was evaluated last year regarding the same but canceled his procedure.  His GI review of systems is remarkable for constipation associated with abdominal bloating discomfort.  Bloating discomfort is relieved with defecation.  He has taken colon cleanse on occasion for his constipation.  His GI review of systems is otherwise negative.  No family history of colon cancer.  I have reviewed limited nonrelevant outside records from his doctor's office.  Most recent blood work from that office is dated August 11, 2018.  Comprehensive metabolic panel is unremarkable with normal creatinine of 1.04.  Hemoglobin 14.5.Marland Kitchen  Review of outside laboratories March 2017 with hemoglobin 13.2.  For his history of DVT he is currently on Eliquis.  He is also on several agents for his diabetes as listed.  CT scan of the abdomen and pelvis remotely revealed periumbilical hernia and kidney stones.  REVIEW OF SYSTEMS:  All non-GI ROS negative as otherwise stated in the HPI except for exertional shortness of breath, arthritis, sinus and allergies  Past Medical History:  Diagnosis Date  . Anemia   . Carpal tunnel syndrome   . Deep vein thrombosis ([place]) 07/2013, 10/2014, 03/16/2015   LLE (studies at [facility])  . Depression   . Diabetes mellitus   . Hyperlipidemia   . Hypertension   . Obesity   . Osteoarthritis   . Peripheral vascular disease ([place])    6 yrs ago, DVT in Lt knee/ groin  . Pulmonary embolism ([facility])    hx of x 3 per patient; most recent  02/2014  . Sinusitis   . Sleep apnea     Past Surgical History:  Procedure Laterality Date  . CARDIAC CATHETERIZATION N/A 08/10/2015   Procedure: Left Heart Cath and Coronary Angiography;  Surgeon: Adrian Prows, MD;  Location: [facility];  Service: Cardiovascular;  Laterality: N/A;  . CYSTOSCOPY WITH RETROGRADE PYELOGRAM, URETEROSCOPY AND STENT PLACEMENT Bilateral 01/03/2015   Procedure: CYSTOSCOPY WITH RETROGRADE PYELOGRAM, RIGHT URETEROSCOPY AND RIGHT STENT PLACEMENT;  Surgeon: Alexis Frock, MD;  Location: [facility];  Service: Urology;  Laterality: Bilateral;  . CYSTOSCOPY WITH RETROGRADE PYELOGRAM, URETEROSCOPY AND STENT PLACEMENT Right 03/12/2016   Procedure: CYSTOSCOPY WITH RETROGRADE PYELOGRAM, URETEROSCOPY AND STENT PLACEMENT;  Surgeon: Alexis Frock, MD;  Location: [facility];  Service: Urology;  Laterality: Right;  . ENDOVENOUS ABLATION SAPHENOUS VEIN W/ LASER Left 04/20/2018   endovenous laser ablation left greater saphenous vein by Tinnie Gens MD   . HOLMIUM LASER APPLICATION Right 3/38/2505   Procedure: HOLMIUM LASER APPLICATION;  Surgeon: Alexis Frock, MD;  Location: [facility];  Service: Urology;  Laterality: Right;  . HOLMIUM LASER APPLICATION Right 02/14/7672   Procedure: HOLMIUM LASER APPLICATION;  Surgeon: Alexis Frock, MD;  Location: [facility];  Service: Urology;  Laterality: Right;  . IVC filter   07/2014   . TONSILLECTOMY    . tubes removd from ears      Social History Travis Rollins  reports that he has never smoked. He has never used smokeless tobacco. He reports that he drinks alcohol. He reports that he does not use drugs.  family history includes Diabetes in his father; Kidney disease in his father.  Allergies  Allergen Reactions  . Penicillins Hives    Has patient had a PCN reaction causing immediate rash, facial/tongue/throat swelling, SOB or lightheadedness with hypotension: No Has patient had a PCN reaction causing severe rash involving mucus membranes or skin  necrosis: No Has patient had a PCN reaction that required hospitalization No Has patient had a PCN reaction occurring within the last 10 years: No If all of the above answers are "NO", then may proceed with Cephalosporin use.       PHYSICAL EXAMINATION: Vital signs: BP 124/66   Pulse 85   Ht 5\' 7"  (1.702 m)   Wt 284 lb (128.8 kg)   BMI 44.48 kg/m   Constitutional: Pleasant, obese, generally well-appearing, no acute distress Psychiatric: alert and oriented x3, cooperative Eyes: extraocular movements intact, anicteric, conjunctiva pink Mouth: oral pharynx moist, no lesions Neck: supple no lymphadenopathy Cardiovascular: heart regular rate and rhythm, no murmur Lungs: clear to auscultation bilaterally Abdomen: soft, obese, nontender, nondistended, no obvious ascites, no peritoneal signs, normal bowel sounds, no organomegaly Rectal: Deferred to colonoscopy Extremities: no clubbing or cyanosis, trace lower extremity edema bilaterally Skin: no lesions on visible extremities Neuro: No focal deficits. No asterixis.   ASSESSMENT:  1.  Constipation with associated abdominal bloating.  No alarm features.  Likely slow transit 2.  Colon cancer screening.  Average risk for colorectal neoplasia.  High risk for procedural related complications 3.  Multiple significant medical problems including history of DVT on chronic anticoagulation and diabetes mellitus as well as morbid obesity with sleep apnea   PLAN:  1.  MiraLAX as needed for constipation 2.  Schedule colonoscopy.  The patient is HIGH RISK given his body habitus and comorbidities.The nature of the procedure, as well as the risks, benefits, and alternatives were carefully and thoroughly reviewed with the patient. Ample time for discussion and questions allowed. The patient understood, was satisfied, and agreed to proceed. 3.  Hold Eliquis 2 days prior to the procedure.  He has not had DVT with PE in many years and has an IVC filter in  place.  This should be reasonable.  The risk-benefit ratio of short-term cessation of anticoagulation discussed.  He understands and agrees. 4.  Adjust diabetic medications prior to the procedure to avoid unwanted hypoglycemia 5.  Patient will need monitored anesthesia care for his sedation.

## 2018-10-19 ENCOUNTER — Ambulatory Visit (AMBULATORY_SURGERY_CENTER): Payer: Medicare HMO | Admitting: Internal Medicine

## 2018-10-19 ENCOUNTER — Encounter: Payer: Self-pay | Admitting: Internal Medicine

## 2018-10-19 VITALS — BP 123/89 | HR 87 | Temp 96.6°F | Resp 13 | Ht 67.0 in | Wt 284.0 lb

## 2018-10-19 DIAGNOSIS — Z1211 Encounter for screening for malignant neoplasm of colon: Secondary | ICD-10-CM | POA: Diagnosis present

## 2018-10-19 DIAGNOSIS — D122 Benign neoplasm of ascending colon: Secondary | ICD-10-CM

## 2018-10-19 MED ORDER — SODIUM CHLORIDE 0.9 % IV SOLN
500.0000 mL | Freq: Once | INTRAVENOUS | Status: DC
Start: 2018-10-19 — End: 2018-10-19

## 2018-10-19 NOTE — Op Note (Signed)
[facility] Patient Name: Travis Rollins Procedure Date: 10/19/2018 11:09 AM MRN: [id number] Endoscopist: Docia Chuck. Henrene Pastor , MD Age: 52 Referring MD:  Date of Birth: [birth year] Gender: Male Account #: [id number] Procedure:                Colonoscopy cold snare polypectomy x 1 Indications:              Screening for colorectal malignant neoplasm Medicines:                Monitored Anesthesia Care Procedure:                Pre-Anesthesia Assessment:                           - Prior to the procedure, a History and Physical                            was performed, and patient medications and                            allergies were reviewed. The patient's tolerance of                            previous anesthesia was also reviewed. The risks                            and benefits of the procedure and the sedation                            options and risks were discussed with the patient.                            All questions were answered, and informed consent                            was obtained. Prior Anticoagulants: The patient has                            taken Eliquis (apixaban), last dose was 3 days                            prior to procedure. ASA Grade Assessment: III - A                            patient with severe systemic disease. After                            reviewing the risks and benefits, the patient was                            deemed in satisfactory condition to undergo the                            procedure.  After obtaining informed consent, the colonoscope                            was passed under direct vision. Throughout the                            procedure, the patient's blood pressure, pulse, and                            oxygen saturations were monitored continuously. The                            Colonoscope was introduced through the anus and                            advanced to the the cecum,  identified by                            appendiceal orifice and ileocecal valve. The                            ileocecal valve, appendiceal orifice, and rectum                            were photographed. The quality of the bowel                            preparation was excellent. The colonoscopy was                            performed without difficulty. The patient tolerated                            the procedure well. The bowel preparation used was                            SUPREP. Scope In: 11:17:12 AM Scope Out: 11:28:37 AM Scope Withdrawal Time: 0 hours 9 minutes 13 seconds  Total Procedure Duration: 0 hours 11 minutes 25 seconds  Findings:                 A 5 mm polyp was found in the ascending colon. The                            polyp was removed with a cold snare. Resection and                            retrieval were complete.                           Internal hemorrhoids were found during                            retroflexion. The hemorrhoids were medium-sized.  The exam was otherwise without abnormality on                            direct and retroflexion views. Complications:            No immediate complications. Estimated blood loss:                            None. Estimated Blood Loss:     Estimated blood loss: none. Impression:               - One 5 mm polyp in the ascending colon, removed                            with a cold snare. Resected and retrieved.                           - Internal hemorrhoids.                           - The examination was otherwise normal on direct                            and retroflexion views. Recommendation:           - Repeat colonoscopy in 5 years for surveillance if                            polyp adenomatous. Otherwise 10 years.                           - Resume Eliquis (apixaban) today at prior dose.                           - Patient has a contact number available for                             emergencies. The signs and symptoms of potential                            delayed complications were discussed with the                            patient. Return to normal activities tomorrow.                            Written discharge instructions were provided to the                            patient.                           - Resume previous diet.                           - Continue present medications.                           -  Await pathology results. Docia Chuck. Henrene Pastor, MD 10/19/2018 11:32:31 AM This report has been signed electronically.

## 2018-10-19 NOTE — Progress Notes (Signed)
Report to PACU, RN, vss, BBS= Clear.  

## 2018-10-19 NOTE — Progress Notes (Signed)
Called to room to assist during endoscopic procedure.  Patient ID and intended procedure confirmed with present staff. Received instructions for my participation in the procedure from the performing physician.  

## 2018-10-19 NOTE — Progress Notes (Signed)
Pt's states no medical or surgical changes since previsit or office visit. 

## 2018-10-19 NOTE — Patient Instructions (Signed)
  RESUME YOUR ELIQUIS AT PREVIOUS DOSE TODAY.  HANDOUTS GIVEN FOR POLYPS AND HEMORRHOIDS.  YOU HAD AN ENDOSCOPIC PROCEDURE TODAY AT [facility]:   Refer to the procedure report that was given to you for any specific questions about what was found during the examination.  If the procedure report does not answer your questions, please call your gastroenterologist to clarify.  If you requested that your care partner not be given the details of your procedure findings, then the procedure report has been included in a sealed envelope for you to review at your convenience later.  YOU SHOULD EXPECT: Some feelings of bloating in the abdomen. Passage of more gas than usual.  Walking can help get rid of the air that was put into your GI tract during the procedure and reduce the bloating. If you had a lower endoscopy (such as a colonoscopy or flexible sigmoidoscopy) you may notice spotting of blood in your stool or on the toilet paper. If you underwent a bowel prep for your procedure, you may not have a normal bowel movement for a few days.  Please Note:  You might notice some irritation and congestion in your nose or some drainage.  This is from the oxygen used during your procedure.  There is no need for concern and it should clear up in a day or so.  SYMPTOMS TO REPORT IMMEDIATELY:   Following lower endoscopy (colonoscopy or flexible sigmoidoscopy):  Excessive amounts of blood in the stool  Significant tenderness or worsening of abdominal pains  Swelling of the abdomen that is new, acute  Fever of 100F or higher   For urgent or emergent issues, a gastroenterologist can be reached at any hour by calling [phone].   DIET:  We do recommend a small meal at first, but then you may proceed to your regular diet.  Drink plenty of fluids but you should avoid alcoholic beverages for 24 hours.  ACTIVITY:  You should plan to take it easy for the rest of today and you should NOT DRIVE or  use heavy machinery until tomorrow (because of the sedation medicines used during the test).    FOLLOW UP: Our staff will call the number listed on your records the next business day following your procedure to check on you and address any questions or concerns that you may have regarding the information given to you following your procedure. If we do not reach you, we will leave a message.  However, if you are feeling well and you are not experiencing any problems, there is no need to return our call.  We will assume that you have returned to your regular daily activities without incident.  If any biopsies were taken you will be contacted by phone or by letter within the next 1-3 weeks.  Please call us at [phone] if you have not heard about the biopsies in 3 weeks.    SIGNATURES/CONFIDENTIALITY: You and/or your care partner have signed paperwork which will be entered into your electronic medical record.  These signatures attest to the fact that that the information above on your After Visit Summary has been reviewed and is understood.  Full responsibility of the confidentiality of this discharge information lies with you and/or your care-partner.

## 2018-10-20 ENCOUNTER — Telehealth: Payer: Self-pay

## 2018-10-20 NOTE — Telephone Encounter (Signed)
  Follow up Call-  Call back number 10/19/2018  Post procedure Call Back phone  # [phone]  Permission to leave phone message Yes  Some recent data might be hidden     Patient questions:  Do you have a fever, pain , or abdominal swelling? No. Pain Score  0 *  Have you tolerated food without any problems? Yes.    Have you been able to return to your normal activities? Yes.    Do you have any questions about your discharge instructions: Diet   No. Medications  No. Follow up visit  No.  Do you have questions or concerns about your Care? No.  Actions: * If pain score is 4 or above: No action needed, pain <4.  No problems noted per pt. maw

## 2018-10-22 ENCOUNTER — Encounter: Payer: Self-pay | Admitting: Internal Medicine

## 2020-09-03 ENCOUNTER — Other Ambulatory Visit: Payer: Self-pay

## 2020-09-03 ENCOUNTER — Ambulatory Visit: Payer: Medicare (Managed Care) | Admitting: Cardiology

## 2020-09-03 ENCOUNTER — Encounter: Payer: Self-pay | Admitting: Cardiology

## 2020-09-03 VITALS — BP 100/72 | HR 81 | Resp 16 | Ht 67.0 in | Wt 261.0 lb

## 2020-09-03 DIAGNOSIS — I2510 Atherosclerotic heart disease of native coronary artery without angina pectoris: Secondary | ICD-10-CM | POA: Insufficient documentation

## 2020-09-03 DIAGNOSIS — I25118 Atherosclerotic heart disease of native coronary artery with other forms of angina pectoris: Secondary | ICD-10-CM | POA: Insufficient documentation

## 2020-09-03 DIAGNOSIS — I10 Essential (primary) hypertension: Secondary | ICD-10-CM | POA: Insufficient documentation

## 2020-09-03 DIAGNOSIS — E785 Hyperlipidemia, unspecified: Secondary | ICD-10-CM | POA: Insufficient documentation

## 2020-09-03 DIAGNOSIS — E782 Mixed hyperlipidemia: Secondary | ICD-10-CM

## 2020-09-03 NOTE — Progress Notes (Signed)
Patient referred by Javier Docker, MD for coronary artery disease  Subjective:   Travis Rollins, male    DOB: [birth year], 54 y.o.   MRN: [id number]   Chief Complaint  Patient presents with  . Coronary Artery Disease  . New Patient (Initial Visit)     HPI  54 y.o. African-American male with hypertension, type 2 DM, h/o recurrent DVT/PE, now s/p IVC filter placement and on long-term anticoagulation, mild nonobstructive coronary artery disease.  Patient was last seen by Dr. Woody Seller in 2016.  He is originally from [place], where he had his first PE in 2005.  He had his last PE in 2015, at which time, IVC filter was placed.  It does not appear that he is seen a hematologist.  He was previously on warfarin, then on Xarelto, and now on Eliquis.  He has not had any PE since 2016.  He underwent coronary angiogram in 2016 that showed mild nonobstructive coronary artery disease.  Patient denies chest pain, shortness of breath, palpitations, leg edema, orthopnea, PND, TIA/syncope.  Reviewed prior tests and imaging.   Past Medical History:  Diagnosis Date  . Anemia   . Carpal tunnel syndrome   . Deep vein thrombosis ([place]) 07/2013, 10/2014, 03/16/2015   LLE (studies at [facility])  . Depression   . Diabetes mellitus   . Hyperlipidemia   . Hypertension   . Obesity   . Osteoarthritis   . Peripheral vascular disease ([place])    6 yrs ago, DVT in Lt knee/ groin  . Pulmonary embolism ([facility])    hx of x 3 per patient; most recent 02/2014  . Sinusitis   . Sleep apnea      Past Surgical History:  Procedure Laterality Date  . CARDIAC CATHETERIZATION N/A 08/10/2015   Procedure: Left Heart Cath and Coronary Angiography;  Surgeon: Adrian Prows, MD;  Location: [facility];  Service: Cardiovascular;  Laterality: N/A;  . CYSTOSCOPY WITH RETROGRADE PYELOGRAM, URETEROSCOPY AND STENT PLACEMENT Bilateral 01/03/2015   Procedure: CYSTOSCOPY WITH RETROGRADE PYELOGRAM, RIGHT URETEROSCOPY AND RIGHT STENT  PLACEMENT;  Surgeon: Alexis Frock, MD;  Location: [facility];  Service: Urology;  Laterality: Bilateral;  . CYSTOSCOPY WITH RETROGRADE PYELOGRAM, URETEROSCOPY AND STENT PLACEMENT Right 03/12/2016   Procedure: CYSTOSCOPY WITH RETROGRADE PYELOGRAM, URETEROSCOPY AND STENT PLACEMENT;  Surgeon: Alexis Frock, MD;  Location: [facility];  Service: Urology;  Laterality: Right;  . ENDOVENOUS ABLATION SAPHENOUS VEIN W/ LASER Left 04/20/2018   endovenous laser ablation left greater saphenous vein by Tinnie Gens MD   . HOLMIUM LASER APPLICATION Right 03/16/1447   Procedure: HOLMIUM LASER APPLICATION;  Surgeon: Alexis Frock, MD;  Location: [facility];  Service: Urology;  Laterality: Right;  . HOLMIUM LASER APPLICATION Right 12/15/5629   Procedure: HOLMIUM LASER APPLICATION;  Surgeon: Alexis Frock, MD;  Location: [facility];  Service: Urology;  Laterality: Right;  . IVC filter   07/2014   . TONSILLECTOMY    . tubes removd from ears       Social History   Tobacco Use  Smoking Status Never Smoker  Smokeless Tobacco Never Used    Social History   Substance and Sexual Activity  Alcohol Use Never     Family History  Problem Relation Age of Onset  . Kidney disease Father   . Diabetes Father   . Colon cancer Neg Hx   . Esophageal cancer Neg Hx   . Rectal cancer Neg Hx   . Stomach cancer Neg Hx  Current Outpatient Medications on File Prior to Visit  Medication Sig Dispense Refill  . allopurinol (ZYLOPRIM) 100 MG tablet Take 100 mg by mouth daily.    Marland Kitchen apixaban (ELIQUIS) 2.5 MG TABS tablet Take 2.5 mg by mouth 2 (two) times daily.     Marland Kitchen atorvastatin (LIPITOR) 40 MG tablet Take 40 mg by mouth at bedtime.     . clomiPHENE (CLOMID) 50 MG tablet Take 50 mg by mouth 3 (three) times a week.    . Empagliflozin-Metformin HCl (SYNJARDY) 12.04-999 MG TABS Take 1 tablet by mouth 2 (two) times daily.    . furosemide (LASIX) 40 MG tablet Take 40 mg by mouth every morning.     . Liraglutide (VICTOZA) 18 MG/3ML SOPN  Inject 18 mg into the skin every morning.    Marland Kitchen lisinopril (PRINIVIL,ZESTRIL) 2.5 MG tablet Take 2.5 mg by mouth daily.    . meloxicam (MOBIC) 15 MG tablet Take 15 mg by mouth every morning.    . metoprolol succinate (TOPROL-XL) 50 MG 24 hr tablet Take 50 mg by mouth at bedtime. Take with or immediately following a meal.    . potassium chloride (K-DUR) 10 MEQ tablet Take 10 mEq by mouth at bedtime.     . tamsulosin (FLOMAX) 0.4 MG CAPS capsule Take 0.4 mg by mouth daily after breakfast.    . TRESIBA FLEXTOUCH 200 UNIT/ML SOPN Inject 24 Units into the skin at bedtime.  3  . ketoconazole (NIZORAL) 2 % shampoo Apply topically daily.    Marland Kitchen topiramate (TOPAMAX) 50 MG tablet Take 50 mg by mouth 2 (two) times daily.     No current facility-administered medications on file prior to visit.    Cardiovascular and other pertinent studies:  EKG 09/03/2020: Sinus rhythm 82 bpm  Normal EKG  LE venous US 2019: Left: Abnormalities consistent with the sequela of a prior venous  obstructive process, with findings that appear chronic/long standing in  nature are identified in the Popliteal vein. Successful vein closure.   Cardiac catheterization 2016: LAD mid calcified 30%, proximal Cx 20%. Codominant Cx. Normal LVEF. IVC filter in situ and appears to be stable and normally placed with hook for retreival facing SVC  Echocardiogram 2015: - Left ventricle: The cavity size was normal. Wall thickness  was increased in a pattern of mild LVH. Systolic function  was normal. The estimated ejection fraction was in the  range of 50% to 55%. Wall motion was normal; there were no  regional wall motion abnormalities. Doppler parameters are  consistent with abnormal left ventricular relaxation  (grade 1 diastolic dysfunction).  - Mitral valve: Calcified annulus.  - Right ventricle: The cavity size was moderately dilated.  Systolic function was severely reduced.  - Right atrium: The atrium was mildly  dilated.  - Atrial septum: The septum bowed from right to left,  consistent with increased right atrial pressure.  - Pulmonary arteries: Systolic pressure was severely  increased. PA peak pressure: 50mm Hg (S).   CTA chest 2015: 1. Bilateral proximal occlusive pulmonary emboli extending into the  left and right upper and lower lobes. Overall clot burden is severe.  2. Abnormal right ventricular to left ventricular ratio consistent  with right heart strain.    Recent labs: 04/24/2020: HbA1C 7.5%  01/29/2020: Glucose 104, BUN/Cr 26/1.16. EGFR 83. Na/K 143/4.8. Rest of the CMP normal H/H 15/46. MCV 95. Platelets 105 Chol 154, TG 130, HDL 45, LDL 86   Review of Systems  Cardiovascular: Negative for chest pain,  dyspnea on exertion, leg swelling, palpitations and syncope.         Vitals:   09/03/20 1247  BP: 100/72  Pulse: 81  Resp: 16  SpO2: 95%     Body mass index is 40.88 kg/m. Filed Weights   09/03/20 1247  Weight: 261 lb (118.4 kg)     Objective:   Physical Exam Vitals and nursing note reviewed.  Constitutional:      General: He is not in acute distress. Neck:     Vascular: No JVD.  Cardiovascular:     Rate and Rhythm: Normal rate and regular rhythm.     Heart sounds: Normal heart sounds. No murmur heard.   Pulmonary:     Effort: Pulmonary effort is normal.     Breath sounds: Normal breath sounds. No wheezing or rales.  Skin:    Comments: Chronic venous stasis changes b/l LE          Assessment & Recommendations:   54 y.o. African-American male with hypertension, type 2 DM, h/o recurrent DVT/PE, now s/p IVC filter placement and on long-term anticoagulation, mild nonobstructive coronary artery disease.  Coronary artery disease: Minimal CAD seen on coronary angiogram in 2016.  Currently, he is asymptomatic from angina or angina equivalent standpoint.  Continue aggressive risk factor modification.  Not on aspirin given ongoing use of Eliquis for  long-term anticoagulation.  Continue the same.  Continue Lipitor 40 mg.  Lipids are reasonably well controlled.  Venous thromboembolism: History of recurrent DVT/PE.  He is now s/p IVC filter, and on long-term anticoagulation with Eliquis 2.5 mg twice daily. We will obtain echocardiogram.  Last echocardiogram, in the setting of PE, had showed right heart strain and pulmonary hypertension.  He does not have any overt symptoms of pulmonary hypertension at this time.  Type 2 DM: Continue management as per PCP.   Venous insufficiency: No overt symptoms, other than lower extremity discoloration.  Recommend compression stockings.  F/u in 1 year  Thank you for referring the patient to [place]. Please feel free to contact with any questions.   Nigel Mormon, MD Pager: [phone] Office: [phone]

## 2020-09-10 ENCOUNTER — Other Ambulatory Visit: Payer: Medicare (Managed Care)

## 2020-09-11 ENCOUNTER — Ambulatory Visit: Payer: Medicare HMO

## 2020-09-11 ENCOUNTER — Other Ambulatory Visit: Payer: Self-pay

## 2020-09-11 DIAGNOSIS — I2510 Atherosclerotic heart disease of native coronary artery without angina pectoris: Secondary | ICD-10-CM

## 2020-09-17 NOTE — Progress Notes (Signed)
Called and spoke with patient regarding his echo results. Patient understands to stop taking Meloxicam.

## 2020-09-17 NOTE — Progress Notes (Signed)
Reviewed echocardiogram. No major change. Low normal heart function.  Please avoid taking meloxicam, as it can increase bleeding risk and also not recommended for your heart. If you must take something for pain, tylenol is preferable.  Thanks MJP

## 2021-05-19 ENCOUNTER — Inpatient Hospital Stay (HOSPITAL_COMMUNITY)
Admission: EM | Admit: 2021-05-19 | Discharge: 2021-05-30 | DRG: 175 | Disposition: A | Discharge status: Home / Self Care | Payer: Medicare (Managed Care) | Attending: Hospitalist | Admitting: Hospitalist

## 2021-05-19 ENCOUNTER — Encounter (HOSPITAL_COMMUNITY): Payer: Self-pay

## 2021-05-19 ENCOUNTER — Emergency Department (HOSPITAL_COMMUNITY): Payer: Medicare (Managed Care)

## 2021-05-19 DIAGNOSIS — I82461 Acute embolism and thrombosis of right calf muscular vein: Secondary | ICD-10-CM | POA: Diagnosis present

## 2021-05-19 DIAGNOSIS — R600 Localized edema: Secondary | ICD-10-CM | POA: Diagnosis present

## 2021-05-19 DIAGNOSIS — D696 Thrombocytopenia, unspecified: Secondary | ICD-10-CM | POA: Diagnosis present

## 2021-05-19 DIAGNOSIS — E785 Hyperlipidemia, unspecified: Secondary | ICD-10-CM | POA: Diagnosis present

## 2021-05-19 DIAGNOSIS — Z6841 Body Mass Index (BMI) 40.0 and over, adult: Secondary | ICD-10-CM

## 2021-05-19 DIAGNOSIS — J189 Pneumonia, unspecified organism: Secondary | ICD-10-CM | POA: Diagnosis present

## 2021-05-19 DIAGNOSIS — E782 Mixed hyperlipidemia: Secondary | ICD-10-CM | POA: Diagnosis present

## 2021-05-19 DIAGNOSIS — D6859 Other primary thrombophilia: Secondary | ICD-10-CM | POA: Diagnosis present

## 2021-05-19 DIAGNOSIS — G4730 Sleep apnea, unspecified: Secondary | ICD-10-CM | POA: Diagnosis present

## 2021-05-19 DIAGNOSIS — E1151 Type 2 diabetes mellitus with diabetic peripheral angiopathy without gangrene: Secondary | ICD-10-CM | POA: Diagnosis present

## 2021-05-19 DIAGNOSIS — I2510 Atherosclerotic heart disease of native coronary artery without angina pectoris: Secondary | ICD-10-CM | POA: Diagnosis present

## 2021-05-19 DIAGNOSIS — Z9114 Patient's other noncompliance with medication regimen: Secondary | ICD-10-CM

## 2021-05-19 DIAGNOSIS — Z95828 Presence of other vascular implants and grafts: Secondary | ICD-10-CM

## 2021-05-19 DIAGNOSIS — E1129 Type 2 diabetes mellitus with other diabetic kidney complication: Secondary | ICD-10-CM | POA: Diagnosis present

## 2021-05-19 DIAGNOSIS — Z7901 Long term (current) use of anticoagulants: Secondary | ICD-10-CM

## 2021-05-19 DIAGNOSIS — Z794 Long term (current) use of insulin: Secondary | ICD-10-CM

## 2021-05-19 DIAGNOSIS — I872 Venous insufficiency (chronic) (peripheral): Secondary | ICD-10-CM | POA: Diagnosis present

## 2021-05-19 DIAGNOSIS — Z880 Allergy status to penicillin: Secondary | ICD-10-CM

## 2021-05-19 DIAGNOSIS — I8390 Asymptomatic varicose veins of unspecified lower extremity: Secondary | ICD-10-CM | POA: Diagnosis present

## 2021-05-19 DIAGNOSIS — I2699 Other pulmonary embolism without acute cor pulmonale: Principal | ICD-10-CM | POA: Diagnosis present

## 2021-05-19 DIAGNOSIS — Z20822 Contact with and (suspected) exposure to covid-19: Secondary | ICD-10-CM | POA: Diagnosis present

## 2021-05-19 DIAGNOSIS — M1990 Unspecified osteoarthritis, unspecified site: Secondary | ICD-10-CM | POA: Diagnosis present

## 2021-05-19 DIAGNOSIS — Z7984 Long term (current) use of oral hypoglycemic drugs: Secondary | ICD-10-CM

## 2021-05-19 DIAGNOSIS — Z833 Family history of diabetes mellitus: Secondary | ICD-10-CM

## 2021-05-19 DIAGNOSIS — Z86711 Personal history of pulmonary embolism: Secondary | ICD-10-CM

## 2021-05-19 DIAGNOSIS — I10 Essential (primary) hypertension: Secondary | ICD-10-CM | POA: Diagnosis present

## 2021-05-19 DIAGNOSIS — Z79899 Other long term (current) drug therapy: Secondary | ICD-10-CM

## 2021-05-19 DIAGNOSIS — E119 Type 2 diabetes mellitus without complications: Secondary | ICD-10-CM

## 2021-05-19 DIAGNOSIS — I25118 Atherosclerotic heart disease of native coronary artery with other forms of angina pectoris: Secondary | ICD-10-CM | POA: Diagnosis present

## 2021-05-19 DIAGNOSIS — M545 Low back pain, unspecified: Secondary | ICD-10-CM | POA: Diagnosis not present

## 2021-05-19 LAB — CBC
HCT: 47.6 % (ref 39.0–52.0)
Hemoglobin: 15 g/dL (ref 13.0–17.0)
MCH: 32.1 pg (ref 26.0–34.0)
MCHC: 31.5 g/dL (ref 30.0–36.0)
MCV: 101.9 fL — ABNORMAL HIGH (ref 80.0–100.0)
Platelets: 80 10*3/uL — ABNORMAL LOW (ref 150–400)
RBC: 4.67 MIL/uL (ref 4.22–5.81)
RDW: 15.7 % — ABNORMAL HIGH (ref 11.5–15.5)
WBC: 6.9 10*3/uL (ref 4.0–10.5)
nRBC: 0 % (ref 0.0–0.2)

## 2021-05-19 LAB — BASIC METABOLIC PANEL
Anion gap: 9 (ref 5–15)
BUN: 22 mg/dL — ABNORMAL HIGH (ref 6–20)
CO2: 25 mmol/L (ref 22–32)
Calcium: 9.3 mg/dL (ref 8.9–10.3)
Chloride: 105 mmol/L (ref 98–111)
Creatinine, Ser: 1.13 mg/dL (ref 0.61–1.24)
GFR, Estimated: >60 mL/min (ref >60)
Glucose, Bld: 143 mg/dL — ABNORMAL HIGH (ref 70–99)
Potassium: 4.4 mmol/L (ref 3.5–5.1)
Sodium: 139 mmol/L (ref 135–145)

## 2021-05-19 NOTE — ED Triage Notes (Signed)
Pt arrives POV for eval of back pain onset Thursday AM. Pt reports pain w/ deep breath. Tachypneic in triage. Reports lower back pain, denies chest pain radiating to back.

## 2021-05-20 ENCOUNTER — Emergency Department (HOSPITAL_COMMUNITY): Payer: Medicare (Managed Care)

## 2021-05-20 ENCOUNTER — Encounter (HOSPITAL_COMMUNITY): Payer: Self-pay | Admitting: Family Medicine

## 2021-05-20 ENCOUNTER — Inpatient Hospital Stay (HOSPITAL_COMMUNITY): Payer: Medicare (Managed Care)

## 2021-05-20 DIAGNOSIS — D6859 Other primary thrombophilia: Secondary | ICD-10-CM | POA: Diagnosis present

## 2021-05-20 DIAGNOSIS — R600 Localized edema: Secondary | ICD-10-CM | POA: Diagnosis present

## 2021-05-20 DIAGNOSIS — I2510 Atherosclerotic heart disease of native coronary artery without angina pectoris: Secondary | ICD-10-CM | POA: Diagnosis present

## 2021-05-20 DIAGNOSIS — I2699 Other pulmonary embolism without acute cor pulmonale: Secondary | ICD-10-CM | POA: Diagnosis present

## 2021-05-20 DIAGNOSIS — E782 Mixed hyperlipidemia: Secondary | ICD-10-CM | POA: Diagnosis present

## 2021-05-20 DIAGNOSIS — Z7901 Long term (current) use of anticoagulants: Secondary | ICD-10-CM | POA: Diagnosis not present

## 2021-05-20 DIAGNOSIS — I82461 Acute embolism and thrombosis of right calf muscular vein: Secondary | ICD-10-CM | POA: Diagnosis present

## 2021-05-20 DIAGNOSIS — G4730 Sleep apnea, unspecified: Secondary | ICD-10-CM | POA: Diagnosis present

## 2021-05-20 DIAGNOSIS — I8390 Asymptomatic varicose veins of unspecified lower extremity: Secondary | ICD-10-CM | POA: Diagnosis present

## 2021-05-20 DIAGNOSIS — D696 Thrombocytopenia, unspecified: Secondary | ICD-10-CM | POA: Diagnosis present

## 2021-05-20 DIAGNOSIS — J189 Pneumonia, unspecified organism: Secondary | ICD-10-CM | POA: Diagnosis present

## 2021-05-20 DIAGNOSIS — Z6841 Body Mass Index (BMI) 40.0 and over, adult: Secondary | ICD-10-CM | POA: Diagnosis not present

## 2021-05-20 DIAGNOSIS — Z7984 Long term (current) use of oral hypoglycemic drugs: Secondary | ICD-10-CM | POA: Diagnosis not present

## 2021-05-20 DIAGNOSIS — I10 Essential (primary) hypertension: Secondary | ICD-10-CM | POA: Diagnosis present

## 2021-05-20 DIAGNOSIS — I2609 Other pulmonary embolism with acute cor pulmonale: Secondary | ICD-10-CM | POA: Diagnosis not present

## 2021-05-20 DIAGNOSIS — M1990 Unspecified osteoarthritis, unspecified site: Secondary | ICD-10-CM | POA: Diagnosis present

## 2021-05-20 DIAGNOSIS — Z833 Family history of diabetes mellitus: Secondary | ICD-10-CM | POA: Diagnosis not present

## 2021-05-20 DIAGNOSIS — Z9114 Patient's other noncompliance with medication regimen: Secondary | ICD-10-CM | POA: Diagnosis not present

## 2021-05-20 DIAGNOSIS — I872 Venous insufficiency (chronic) (peripheral): Secondary | ICD-10-CM | POA: Diagnosis present

## 2021-05-20 DIAGNOSIS — Z95828 Presence of other vascular implants and grafts: Secondary | ICD-10-CM | POA: Diagnosis not present

## 2021-05-20 DIAGNOSIS — E1151 Type 2 diabetes mellitus with diabetic peripheral angiopathy without gangrene: Secondary | ICD-10-CM | POA: Diagnosis present

## 2021-05-20 DIAGNOSIS — Z86711 Personal history of pulmonary embolism: Secondary | ICD-10-CM | POA: Diagnosis not present

## 2021-05-20 DIAGNOSIS — Z880 Allergy status to penicillin: Secondary | ICD-10-CM | POA: Diagnosis not present

## 2021-05-20 DIAGNOSIS — Z20822 Contact with and (suspected) exposure to covid-19: Secondary | ICD-10-CM | POA: Diagnosis present

## 2021-05-20 DIAGNOSIS — M545 Low back pain, unspecified: Secondary | ICD-10-CM | POA: Diagnosis present

## 2021-05-20 LAB — HIV ANTIBODY (ROUTINE TESTING W REFLEX): HIV Screen 4th Generation wRfx: NONREACTIVE

## 2021-05-20 LAB — CBG MONITORING, ED
Glucose-Capillary: 114 mg/dL — ABNORMAL HIGH (ref 70–99)
Glucose-Capillary: 130 mg/dL — ABNORMAL HIGH (ref 70–99)

## 2021-05-20 LAB — URINALYSIS, ROUTINE W REFLEX MICROSCOPIC
Bacteria, UA: NONE SEEN
Bilirubin Urine: NEGATIVE
Glucose, UA: >=500 mg/dL — AB
Hgb urine dipstick: NEGATIVE
Ketones, ur: NEGATIVE mg/dL
Leukocytes,Ua: NEGATIVE
Nitrite: NEGATIVE
Protein, ur: NEGATIVE mg/dL
Specific Gravity, Urine: 1.027 (ref 1.005–1.030)
pH: 5 (ref 5.0–8.0)

## 2021-05-20 LAB — D-DIMER, QUANTITATIVE: D-Dimer, Quant: >20 ug/mL-FEU — ABNORMAL HIGH (ref 0.00–0.50)

## 2021-05-20 LAB — APTT
aPTT: 25 seconds (ref 24–36)
aPTT: 41 seconds — ABNORMAL HIGH (ref 24–36)

## 2021-05-20 LAB — RESP PANEL BY RT-PCR (FLU A&B, COVID) ARPGX2
Influenza A by PCR: NEGATIVE
Influenza B by PCR: NEGATIVE
SARS Coronavirus 2 by RT PCR: NEGATIVE

## 2021-05-20 LAB — ANTITHROMBIN III: AntiThromb III Func: 90 % (ref 75–120)

## 2021-05-20 LAB — BRAIN NATRIURETIC PEPTIDE: B Natriuretic Peptide: 31.4 pg/mL (ref 0.0–100.0)

## 2021-05-20 LAB — PROTIME-INR
INR: 1.2 (ref 0.8–1.2)
Prothrombin Time: 14.9 seconds (ref 11.4–15.2)

## 2021-05-20 LAB — TROPONIN I (HIGH SENSITIVITY)
Troponin I (High Sensitivity): 26 ng/L — ABNORMAL HIGH (ref <18)
Troponin I (High Sensitivity): 22 ng/L — ABNORMAL HIGH (ref <18)

## 2021-05-20 MED ORDER — MORPHINE SULFATE (PF) 2 MG/ML IV SOLN: 2.0000 mg | INTRAVENOUS | Status: DC | PRN

## 2021-05-20 MED ORDER — HEPARIN (PORCINE) 25000 UT/250ML-% IV SOLN
2350.0000 [IU]/h | INTRAVENOUS | Status: DC
  Administered 2021-05-20: 1600 [IU]/h via INTRAVENOUS
  Administered 2021-05-21: 1900 [IU]/h via INTRAVENOUS
  Administered 2021-05-22: 2300 [IU]/h via INTRAVENOUS
  Administered 2021-05-23: 2500 [IU]/h via INTRAVENOUS
  Administered 2021-05-23: 2350 [IU]/h via INTRAVENOUS
  Administered 2021-05-23: 2650 [IU]/h via INTRAVENOUS
  Administered 2021-05-24: 2350 [IU]/h via INTRAVENOUS
  Filled 2021-05-20 (×9): qty 250

## 2021-05-20 MED ORDER — SODIUM CHLORIDE 0.9% FLUSH: 3.0000 mL | INTRAVENOUS | Status: DC | PRN

## 2021-05-20 MED ORDER — LIDOCAINE 5 % EX PTCH
1.0000 | MEDICATED_PATCH | CUTANEOUS | Status: DC
  Administered 2021-05-20 – 2021-05-30 (×10): 1 via TRANSDERMAL
  Filled 2021-05-20 (×10): qty 1

## 2021-05-20 MED ORDER — HEPARIN BOLUS VIA INFUSION
3000.0000 [IU] | Freq: Once | INTRAVENOUS | Status: AC
  Administered 2021-05-20: 3000 [IU] via INTRAVENOUS
  Filled 2021-05-20: qty 3000

## 2021-05-20 MED ORDER — HYDROCODONE-ACETAMINOPHEN 5-325 MG PO TABS
1.0000 | ORAL_TABLET | ORAL | Status: DC | PRN
Start: 2021-05-20 — End: 2021-05-29
  Administered 2021-05-21: 1 via ORAL
  Filled 2021-05-20: qty 1

## 2021-05-20 MED ORDER — ACETAMINOPHEN 500 MG PO TABS
1000.0000 mg | ORAL_TABLET | Freq: Once | ORAL | Status: AC
  Administered 2021-05-20: 1000 mg via ORAL
  Filled 2021-05-20: qty 2

## 2021-05-20 MED ORDER — ACETAMINOPHEN 650 MG RE SUPP: 650.0000 mg | Freq: Four times a day (QID) | RECTAL | Status: DC | PRN

## 2021-05-20 MED ORDER — SODIUM CHLORIDE 0.9% FLUSH
3.0000 mL | Freq: Two times a day (BID) | INTRAVENOUS | Status: DC
  Administered 2021-05-20 – 2021-05-29 (×20): 3 mL via INTRAVENOUS

## 2021-05-20 MED ORDER — IOHEXOL 350 MG/ML SOLN
75.0000 mL | Freq: Once | INTRAVENOUS | Status: AC | PRN
  Administered 2021-05-20: 75 mL via INTRAVENOUS

## 2021-05-20 MED ORDER — INSULIN ASPART 100 UNIT/ML IJ SOLN
0.0000 [IU] | Freq: Three times a day (TID) | INTRAMUSCULAR | Status: DC
  Administered 2021-05-21 – 2021-05-23 (×3): 3 [IU] via SUBCUTANEOUS
  Administered 2021-05-23 – 2021-05-26 (×3): 4 [IU] via SUBCUTANEOUS
  Administered 2021-05-27: 3 [IU] via SUBCUTANEOUS
  Administered 2021-05-27 – 2021-05-28 (×2): 4 [IU] via SUBCUTANEOUS
  Administered 2021-05-29: 15 [IU] via SUBCUTANEOUS
  Administered 2021-05-29: 3 [IU] via SUBCUTANEOUS
  Administered 2021-05-30: 11 [IU] via SUBCUTANEOUS

## 2021-05-20 MED ORDER — ACETAMINOPHEN 325 MG PO TABS: 650.0000 mg | ORAL_TABLET | Freq: Four times a day (QID) | ORAL | Status: DC | PRN

## 2021-05-20 MED ORDER — SODIUM CHLORIDE 0.9 % IV BOLUS
1000.0000 mL | Freq: Once | INTRAVENOUS | Status: AC
  Administered 2021-05-20: 1000 mL via INTRAVENOUS

## 2021-05-20 MED ORDER — SODIUM CHLORIDE 0.9 % IV SOLN: 250.0000 mL | INTRAVENOUS | Status: DC | PRN

## 2021-05-20 MED ORDER — CYCLOBENZAPRINE HCL 10 MG PO TABS
5.0000 mg | ORAL_TABLET | Freq: Once | ORAL | Status: AC
  Administered 2021-05-20: 5 mg via ORAL
  Filled 2021-05-20: qty 1

## 2021-05-20 NOTE — Consult Note (Addendum)
[place]  [phone] [phone]    [place]  Referring MD: Dr. 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  It appears that the patient is taking apixaban 2.5 mg twice a day.  The patient presented to the hospital with left lower back pain and shortness of breath.  Back pain and shortness of breath started about 5 days ago.  He has his baseline chronic lower extremity edema.  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  CBC performed on admission showed a normal WBC and hemoglobin but platelet count was 80,000.  Looking back over his platelet count over the past 7 years shows that his platelet count has been persistently low in the 80-110,000 range.  A CTA of the chest was performed on admission which showed extensive bilateral pulmonary embolism, right ventricular strain.  CT renal stone study showed no acute finding, right hepatic steatosis.  Doppler ultrasound of the bilateral lower extremities has been performed and preliminary results show age indeterminate DVT involving the right gastrocnemius veins, no DVT on the left. An echocardiogram has been ordered.  He has been started on heparin.  The patient was seen in the emergency room today.  He reports that his pain is somewhat improved.  Currently rates pain a 7.5 out of 10.  He still has significant pain in his left lower back with deep inspiration.  He currently denies fevers, chills, chest pain, cough, abdominal pain, nausea, vomiting.  He has bilateral lower extremity edema which he reports is his baseline.  His left leg is typically always more edematous than his right.   He denies recent bleeding.  He reports that he had a IVC filter placed in 2015. When questioned about his history of anticoagulant use, he tells me that he has been on warfarin in the past and states that this was changed due to development of clots while on this medication.  He has also taken Xarelto and Pradaxa in the past.  He can recall having melena and hematochezia while taking Xarelto.  He cannot recall why Pradaxa was stopped but he suspects it was due to recurrent blood clots while taking this medication.  He states that he has been on Eliquis for about 4 to 5 years and has tolerated quite well.  He confirms for me that the dose of Eliquis that he was taking was 2.5 mg twice a day.  He is unclear as to why he is taking a lower dose of Eliquis.  He tells me that his primary care provider has been prescribing his Eliquis.  He cannot recall the name of his primary care provider.  He denies any missed doses of Eliquis.  He has never seen a hematologist in the past.  The patient denies history of alcohol and tobacco use.  He is not taking any supplements or hormone replacement.  Had a recent COVID-19 booster on 6/3 Therapist, music).  Denies family history of clotting disorders.  Hematology was asked see the patient make recommendations regarding his bilateral pulmonary emboli and anticoagulation recommendations.   Past Medical History:  Diagnosis Date   Anemia    Carpal tunnel syndrome    Deep vein thrombosis ([place]) 07/2013, 10/2014, 03/16/2015  LLE (studies at [facility])   Depression    Diabetes mellitus    Hyperlipidemia    Hypertension    Obesity    Osteoarthritis    Peripheral vascular disease ([place])    6 yrs ago, DVT in Lt knee/ groin   Pulmonary embolism (HCC)    hx of x 3 per patient; most recent 02/2014   Sinusitis    Sleep apnea   :     Past Surgical History:  Procedure Laterality Date   CARDIAC CATHETERIZATION N/A 08/10/2015   Procedure: Left Heart Cath and Coronary Angiography;  Surgeon:  Adrian Prows, MD;  Location: [place] [facility];  Service: Cardiovascular;  Laterality: N/A;   CYSTOSCOPY WITH RETROGRADE PYELOGRAM, URETEROSCOPY AND STENT PLACEMENT Bilateral 01/03/2015   Procedure: CYSTOSCOPY WITH RETROGRADE PYELOGRAM, RIGHT URETEROSCOPY AND RIGHT STENT PLACEMENT;  Surgeon: Alexis Frock, MD;  Location: [facility];  Service: Urology;  Laterality: Bilateral;   CYSTOSCOPY WITH RETROGRADE PYELOGRAM, URETEROSCOPY AND STENT PLACEMENT Right 03/12/2016   Procedure: CYSTOSCOPY WITH RETROGRADE PYELOGRAM, URETEROSCOPY AND STENT PLACEMENT;  Surgeon: Alexis Frock, MD;  Location: [facility];  Service: Urology;  Laterality: Right;   ENDOVENOUS ABLATION SAPHENOUS VEIN W/ LASER Left 04/20/2018   endovenous laser ablation left greater saphenous vein by Tinnie Gens MD    HOLMIUM LASER APPLICATION Right 5/63/8756   Procedure: HOLMIUM LASER APPLICATION;  Surgeon: Alexis Frock, MD;  Location: [facility];  Service: Urology;  Laterality: Right;   HOLMIUM LASER APPLICATION Right 03/10/3294   Procedure: HOLMIUM LASER APPLICATION;  Surgeon: Alexis Frock, MD;  Location: [facility];  Service: Urology;  Laterality: Right;   IVC filter   07/2014    TONSILLECTOMY     tubes removd from ears    :   CURRENT MEDS: Current Facility-Administered Medications  Medication Dose Route Frequency Provider Last Rate Last Admin   0.9 %  sodium chloride infusion  250 mL Intravenous PRN Orma Flaming, MD       acetaminophen (TYLENOL) tablet 650 mg  650 mg Oral Q6H PRN Orma Flaming, MD       Or   acetaminophen (TYLENOL) suppository 650 mg  650 mg Rectal Q6H PRN Orma Flaming, MD       heparin ADULT infusion 100 units/mL (25000 units/233mL)  1,600 Units/hr Intravenous Continuous Cala Bradford, RPH       heparin bolus via infusion 3,000 Units  3,000 Units Intravenous Once Cala Bradford, [facility]       HYDROcodone-acetaminophen (NORCO/VICODIN) 5-325 MG per tablet 1-2 tablet  1-2 tablet Oral Q4H PRN Orma Flaming, MD       insulin aspart  (novoLOG) injection 0-20 Units  0-20 Units Subcutaneous TID WC Orma Flaming, MD       lidocaine (LIDODERM) 5 % 1 patch  1 patch Transdermal Q24H Loni Beckwith, PA-C   1 patch at 05/20/21 1006   morphine 2 MG/ML injection 2 mg  2 mg Intravenous Q2H PRN Orma Flaming, MD       sodium chloride flush (NS) 0.9 % injection 3 mL  3 mL Intravenous Q12H Orma Flaming, MD       sodium chloride flush (NS) 0.9 % injection 3 mL  3 mL Intravenous PRN Orma Flaming, MD       Current Outpatient Medications  Medication Sig Dispense Refill   allopurinol (ZYLOPRIM) 100 MG tablet Take 100 mg by mouth daily.     apixaban (ELIQUIS) 2.5 MG TABS tablet Take 2.5 mg by mouth 2 (two) times daily.  atorvastatin (LIPITOR) 40 MG tablet Take 40 mg by mouth at bedtime.      Empagliflozin-metFORMIN HCl 12.04-999 MG TABS Take 1 tablet by mouth daily.     furosemide (LASIX) 40 MG tablet Take 40 mg by mouth every morning.      liraglutide (VICTOZA) 18 MG/3ML SOPN Inject 18 mg into the skin every morning.     lisinopril (PRINIVIL,ZESTRIL) 2.5 MG tablet Take 2.5 mg by mouth daily.     metoprolol succinate (TOPROL-XL) 50 MG 24 hr tablet Take 50 mg by mouth at bedtime. Take with or immediately following a meal.     polycarbophil (FIBERCON) 625 MG tablet Take 625 mg by mouth in the morning, at noon, and at bedtime.     potassium chloride (K-DUR) 10 MEQ tablet Take 10 mEq by mouth at bedtime.      tamsulosin (FLOMAX) 0.4 MG CAPS capsule Take 0.4 mg by mouth daily after breakfast.     topiramate (TOPAMAX) 50 MG tablet Take 50 mg by mouth 2 (two) times daily.     TRESIBA FLEXTOUCH 200 UNIT/ML SOPN Inject 26 Units into the skin at bedtime.  3       Allergies  Allergen Reactions   Penicillins Hives    Has patient had a PCN reaction causing immediate rash, facial/tongue/throat swelling, SOB or lightheadedness with hypotension: No Has patient had a PCN reaction causing severe rash involving mucus membranes or skin  necrosis: No Has patient had a PCN reaction that required hospitalization No Has patient had a PCN reaction occurring within the last 10 years: No If all of the above answers are "NO", then may proceed with Cephalosporin use.  :   Family History  Problem Relation Age of Onset   Kidney disease Father    Diabetes Father    Colon cancer Neg Hx    Esophageal cancer Neg Hx    Rectal cancer Neg Hx    Stomach cancer Neg Hx   :   Social History   Socioeconomic History   Marital status: Single    Spouse name: Not on file   Number of children: 0   Years of education: Not on file   Highest education level: Not on file  Occupational History   Not on file  Tobacco Use   Smoking status: Never   Smokeless tobacco: Never  Vaping Use   Vaping Use: Never used  Substance and Sexual Activity   Alcohol use: Never   Drug use: No   Sexual activity: Yes    Birth control/protection: Condom  Other Topics Concern   Not on file  Social History Narrative   Not on file   Social Determinants of Health   Financial Resource Strain: Not on file  Food Insecurity: Not on file  Transportation Needs: Not on file  Physical Activity: Not on file  Stress: Not on file  Social Connections: Not on file  Intimate Partner Violence: Not on file  :  REVIEW OF SYSTEMS:  A comprehensive 14 point review of systems was negative except as noted in the HPI.    Exam: Patient Vitals for the past 24 hrs:  BP Temp Temp src Pulse Resp SpO2 Height Weight  05/20/21 1305 (!) 158/114 -- -- (!) 102 (!) 31 98 % -- --  05/20/21 1200 (!) 127/91 -- -- 90 (!) 22 94 % -- --  05/20/21 1130 (!) 133/94 -- -- 89 18 95 % -- --  05/20/21 1030 133/90 -- -- 91 (!) 26  92 % -- --  05/20/21 1000 (!) 129/91 -- -- 94 (!) 27 94 % -- --  05/20/21 0952 -- 98.1 F (36.7 C) Oral -- -- -- -- --  05/20/21 0951 (!) 129/95 -- -- 94 (!) 24 95 % -- --  05/20/21 0915 (!) 129/92 -- -- 91 (!) 42 98 % -- --  05/20/21 0900 (!) 141/86 -- -- (!)  102 (!) 29 97 % -- --  05/20/21 0738 130/75 98.5 F (36.9 C) Oral 90 17 96 % -- --  05/20/21 0500 131/79 98.9 F (37.2 C) Oral 92 17 94 % -- --  05/20/21 0104 (!) 163/91 -- -- (!) 107 18 93 % -- --  05/19/21 2059 (!) 144/98 98.9 F (37.2 C) Oral (!) 106 (!) 26 94 % -- --  05/19/21 2058 -- -- -- -- -- -- 5\' 7"  (1.702 m) 124.3 kg    General:  well-nourished in no acute distress.   Eyes:  no scleral icterus.   ENT:  There were no oropharyngeal lesions.   Lymphatics:  Negative cervical, supraclavicular or axillary adenopathy.   Respiratory: lungs were clear bilaterally without wheezing or crackles.   Cardiovascular:  Regular rate and rhythm, S1/S2, without murmur, rub or gallop.  Bilateral lower extremity edema, left greater than right GI:  abdomen was soft, flat, nontender, nondistended, without organomegaly.   Musculoskeletal: Strength symmetrical in the upper and lower extremities. Skin exam was without ecchymosis, petechiae.   Neuro exam was nonfocal.  Patient was alert and oriented.  Attention was good.   Language was appropriate.  Mood was normal without depression.  Speech was not pressured.  Thought content was not tangential.    LABS:  Lab Results  Component Value Date   WBC 6.9 05/19/2021   HGB 15.0 05/19/2021   HCT 47.6 05/19/2021   PLT 80 (L) 05/19/2021   GLUCOSE 143 (H) 05/19/2021   CHOL 209 (H) 03/22/2012   TRIG 166 (H) 03/22/2012   HDL 44 03/22/2012   LDLCALC 132 (H) 03/22/2012   ALT 16 05/10/2014   AST 21 05/10/2014   NA 139 05/19/2021   K 4.4 05/19/2021   CL 105 05/19/2021   CREATININE 1.13 05/19/2021   BUN 22 (H) 05/19/2021   CO2 25 05/19/2021   INR 1.2 05/20/2021   HGBA1C 5.9 (H) 03/12/2016    DG Chest 2 View  Result Date: 05/19/2021 CLINICAL DATA:  Shortness of breath EXAM: CHEST - 2 VIEW COMPARISON:  01/02/2015 FINDINGS: Bibasilar atelectasis. Heart is normal size. No effusions. No acute bony abnormality. IMPRESSION: Bibasilar atelectasis.  Electronically Signed   By: Rolm Baptise M.D.   On: 05/19/2021 21:46   CT Angio Chest PE W and/or Wo Contrast  Result Date: 05/20/2021 CLINICAL DATA:  Positive D-dimer. Shortness of breath. Chest pain with deep inspiration. EXAM: CT ANGIOGRAPHY CHEST WITH CONTRAST TECHNIQUE: Multidetector CT imaging of the chest was performed using the standard protocol during bolus administration of intravenous contrast. Multiplanar CT image reconstructions and MIPs were obtained to evaluate the vascular anatomy. CONTRAST:  57mL OMNIPAQUE IOHEXOL 350 MG/ML SOLN COMPARISON:  02/25/2014 FINDINGS: Cardiovascular: Heart size is at the upper limits of normal. There is coronary artery calcification and aortic atherosclerotic calcification. There is extensive bilateral pulmonary embolism. Right ventricular to left ventricular ratio is 0.86, approaching significance. Mediastinum/Nodes: No mass or lymphadenopathy. Lungs/Pleura: Upper lobes are clear except for a few small areas of scarring and emphysematous change. There is atelectasis and or infiltrate in the left lower  lobe. Minimal atelectasis at the right base. Upper Abdomen: Negative Musculoskeletal: Ordinary spinal degenerative changes. Review of the MIP images confirms the above findings. IMPRESSION: Extensive bilateral pulmonary embolism. Right ventricular to left ventricular ratio is 0.86, approaching the generally accepted level of 0.9 which would indicate right ventricular strain. Electronically Signed   By: Nelson Chimes M.D.   On: 05/20/2021 12:44   CT Renal Stone Study  Result Date: 05/20/2021 CLINICAL DATA:  Flank pain with kidney stone suspected EXAM: CT ABDOMEN AND PELVIS WITHOUT CONTRAST TECHNIQUE: Multidetector CT imaging of the abdomen and pelvis was performed following the standard protocol without IV contrast. COMPARISON:  05/10/2014 FINDINGS: Lower chest: Coronary atherosclerosis. Atelectasis at the lung bases. Hepatobiliary: Regional steatosis in the right  liver. No underlying mass or ductal dilatation seen.No evidence of biliary obstruction or stone. Pancreas: Unremarkable. Spleen: Unremarkable. Adrenals/Urinary Tract: Negative adrenals. No hydronephrosis or stone. Unremarkable bladder. Stomach/Bowel:  No obstruction. No appendicitis. Vascular/Lymphatic: No acute vascular abnormality. IVC filter in expected position. Diffuse atheromatous calcification of the aorta and branch vessels. No mass or adenopathy. Reproductive:Seminal vesicle calcifications. Other: No ascites or pneumoperitoneum.  Fatty umbilical hernia. Musculoskeletal: No acute abnormalities. Prominent lumbar facet spurring with ankylosis at L3-4 and below. Multilevel thoracic spondylosis. IMPRESSION: 1. No acute finding.  No hydronephrosis or ureteral calculus. 2. Right hepatic steatosis. 3. Atherosclerosis including the coronary arteries. Electronically Signed   By: Monte Fantasia M.D.   On: 05/20/2021 10:02     ASSESSMENT AND PLAN:  1.  Bilateral pulmonary embolism with right ventricular strain/age-indeterminate DVT right leg, history of recurrent DVT and PE 2.  Thrombocytopenia, chronic 3.  Diabetes mellitus 4.  CAD 5.  Hypertension 6.  Hyperlipidemia 7.  Peripheral vascular disease 8.  Hepatic steatosis  -The patient has a longstanding history of recurrent PE and DVT and now with extensive bilateral pulmonary embolism with right ventricular strain.  Most recently, he has been on Eliquis 2.5 mg twice a day.  Unclear as to why he is on a lower dose of Eliquis.  Denies missed doses.  If dosing is accurate, he is on too little dose of Eliquis which is why he has developed recurrent clots. -He has been on multiple anticoagulants in the past with recurrent clots or side effects. -Begin heparin drip.  May be reasonable to consider low molecular weight heparin for him. -We will need to monitor platelet count closely as he has underlying chronic thrombocytopenia. -Hypercoagulable work-up  should also be considered for this patient.  Thank you for this referral.  Mikey Bussing, DNP, AGPCNP-BC, AOCNP  ADDENDUM: I saw and examined Mr. Elwood.  I agree with the above assessment by Erasmo Downer.  I think we have a real problem and that he has been on multiple anticoagulants.  I am not sure as to why he would keep having recurrent thrombotic episodes.  He has the IVC filter in place.  I will know if the filter is full of blood clots and one may have picked off from there and gone to his lungs.  I does have a hard time believing he is never seen a hematologist despite having multiple thromboembolic episodes.  He was only on Eliquis to 2.5 mg p.o. twice daily.  Again I am not sure as to why he was on the lower dose.  Regardless, I am not sure that a standard dose is going to make a difference.  He has been on Coumadin.  He says he had blood clots on Coumadin.  He does have significant thromboembolic disease burden in the lungs.  He has right heart strain.  I just wonder if he would be a candidate for thrombolytic therapy since we really are limited as to how we can treat his thromboembolic disease systemically.  I know he has the thrombocytopenia.  This is mild.  I really do not think this would be a contraindication for thrombolytic therapy.  Is possible he may have anticardiolipin antibody syndrome.  This definitely needs to be checked.  I realize that if there is the anticardiolipin antibody syndrome, then Coumadin is the treatment of choice.  I would continue him on heparin for right now.  I would check the IVC filter.  I will know if he needs a IVC venogram.  His renal function is fine so he can certainly handle the dye load.  This is quite complicated.  It is certainly not straightforward.  I think were going to put him on Lovenox or Arixtra, this might be an issue with him trying to administer this at home.  Again, he has quite a bit of clot burden in his lungs.  He has  right heart strain.  I just wonder if he would not be a candidate for thrombolytic therapy.  I know this is not an easy decision to make.  He seems very nice.  I enjoyed talking to him.  There is no one else in the family who has had problems with blood clots.  We will follow along and try to help out as much as we can.    Lattie Haw, MD  [name] 30:18

## 2021-05-20 NOTE — Progress Notes (Signed)
Lower extremity venous has been completed.   Preliminary results in CV Proc.   Travis Rollins 05/20/2021 3:21 PM

## 2021-05-20 NOTE — ED Provider Notes (Signed)
[place] EMERGENCY DEPARTMENT Provider Note   CSN: [id number] Arrival date & time: 05/19/21  2052     History Chief Complaint  Patient presents with   Back Pain   Shortness of Breath    Travis Rollins is a 55 y.o. male with a past medical history of diabetes, hyperlipidemia, hypertension, DVT, CAD.  Patient presents emerged department with a chief complaint of left lumbar back pain and shortness of breath.  Patient reports that his back pain started on Friday and has been constant since then.  Patient describes pain as sharp and spasms.  Pain has gotten progressively worse.  Patient rates pain 10/10 on pain scale.  Pain does not radiate anywhere.  Pain is worse with movement or inhalation.  Laying still provides minimal improvement in pain.  Patient patient states "I think I injured my back on Thursday."  When asked how he may have injured his back patient states "I think I slept on it wrong."  He denies any heavy lifting, falls, or traumatic injuries.  Patient reports that his shortness of breath is related to his back pain.  Patient states that he feels he cannot take a deep breath due to increasing pain.  Patient denies any cough, hemoptysis, chest pain, palpitations.  Patient endorses bilateral lower leg swelling at baseline.  Patient states this is unchanged.  Patient denies any tenderness to lower extremities, rash, or erythema.  He denies any bowel or bladder dysfunction, saddle anesthesia, numbness, weakness, facial asymmetry, slurred speech, visual disturbance, abdominal pain, nausea, vomiting, syncopal episode, seizures.  Patient denies any IV drug use, cancer treatment, unexplained weight loss, night sweats, surgery last 12 weeks, hormone therapy.   Back Pain Associated symptoms: no abdominal pain, no chest pain, no dysuria, no fever, no headaches, no numbness and no weakness   Shortness of Breath Associated symptoms: no abdominal pain, no chest pain,  no cough, no diaphoresis, no fever, no headaches, no neck pain, no rash and no vomiting       Past Medical History:  Diagnosis Date   Anemia    Carpal tunnel syndrome    Deep vein thrombosis ([place]) 07/2013, 10/2014, 03/16/2015   LLE (studies at [facility])   Depression    Diabetes mellitus    Hyperlipidemia    Hypertension    Obesity    Osteoarthritis    Peripheral vascular disease ([place])    6 yrs ago, DVT in Lt knee/ groin   Pulmonary embolism ([facility])    hx of x 3 per patient; most recent 02/2014   Sinusitis    Sleep apnea     Patient Active Problem List   Diagnosis Date Noted   Coronary artery disease involving native coronary artery of native heart without angina pectoris 09/03/2020   Mixed hyperlipidemia 09/03/2020   Essential hypertension 09/03/2020   Chronic deep vein thrombosis (DVT) of popliteal vein of left lower extremity ([place]) 04/06/2018   Varicose veins of left lower extremity with complications 22/97/9892   Atypical chest pain 08/09/2015   Thrombocytopenia ([place]) 02/26/2014   PE (pulmonary thromboembolism) ([place]) 02/25/2014   Hyperkalemia 02/25/2014   Acute respiratory failure with hypoxia ([place]) 02/25/2014   Morbid obesity ([place]) 03/20/2012   DM (diabetes mellitus), type 2 with renal complications ([place]) 11/94/1740   Venous stasis 03/20/2012    Past Surgical History:  Procedure Laterality Date   CARDIAC CATHETERIZATION N/A 08/10/2015   Procedure: Left Heart Cath and Coronary Angiography;  Surgeon: Adrian Prows, MD;  Location: [facility]  INVASIVE CV LAB;  Service: Cardiovascular;  Laterality: N/A;   CYSTOSCOPY WITH RETROGRADE PYELOGRAM, URETEROSCOPY AND STENT PLACEMENT Bilateral 01/03/2015   Procedure: CYSTOSCOPY WITH RETROGRADE PYELOGRAM, RIGHT URETEROSCOPY AND RIGHT STENT PLACEMENT;  Surgeon: Alexis Frock, MD;  Location: [facility];  Service: Urology;  Laterality: Bilateral;   CYSTOSCOPY WITH RETROGRADE PYELOGRAM, URETEROSCOPY AND STENT PLACEMENT Right 03/12/2016   Procedure: CYSTOSCOPY  WITH RETROGRADE PYELOGRAM, URETEROSCOPY AND STENT PLACEMENT;  Surgeon: Alexis Frock, MD;  Location: [facility];  Service: Urology;  Laterality: Right;   ENDOVENOUS ABLATION SAPHENOUS VEIN W/ LASER Left 04/20/2018   endovenous laser ablation left greater saphenous vein by Tinnie Gens MD    HOLMIUM LASER APPLICATION Right 02/28/4009   Procedure: HOLMIUM LASER APPLICATION;  Surgeon: Alexis Frock, MD;  Location: [facility];  Service: Urology;  Laterality: Right;   HOLMIUM LASER APPLICATION Right 01/14/2535   Procedure: HOLMIUM LASER APPLICATION;  Surgeon: Alexis Frock, MD;  Location: [facility];  Service: Urology;  Laterality: Right;   IVC filter   07/2014    TONSILLECTOMY     tubes removd from ears         Family History  Problem Relation Age of Onset   Kidney disease Father    Diabetes Father    Colon cancer Neg Hx    Esophageal cancer Neg Hx    Rectal cancer Neg Hx    Stomach cancer Neg Hx     Social History   Tobacco Use   Smoking status: Never   Smokeless tobacco: Never  Vaping Use   Vaping Use: Never used  Substance Use Topics   Alcohol use: Never   Drug use: No    Home Medications Prior to Admission medications   Medication Sig Start Date End Date Taking? Authorizing Provider  allopurinol (ZYLOPRIM) 100 MG tablet Take 100 mg by mouth daily.    [provider]  apixaban (ELIQUIS) 2.5 MG TABS tablet Take 2.5 mg by mouth 2 (two) times daily.     [provider]  atorvastatin (LIPITOR) 40 MG tablet Take 40 mg by mouth at bedtime.     [provider]  clomiPHENE (CLOMID) 50 MG tablet Take 50 mg by mouth 3 (three) times a week.    [provider]  Empagliflozin-Metformin HCl (SYNJARDY) 12.04-999 MG TABS Take 1 tablet by mouth 2 (two) times daily.    [provider]  furosemide (LASIX) 40 MG tablet Take 40 mg by mouth every morning.     [provider]  ketoconazole (NIZORAL) 2 % shampoo Apply topically daily. 08/20/20   [provider]  Liraglutide (VICTOZA) 18 MG/3ML SOPN Inject 18 mg into the skin every morning.    [provider]  lisinopril (PRINIVIL,ZESTRIL) 2.5 MG tablet Take 2.5 mg by mouth daily.    [provider]  meloxicam (MOBIC) 15 MG tablet Take 15 mg by mouth every morning.    [provider]  metoprolol succinate (TOPROL-XL) 50 MG 24 hr tablet Take 50 mg by mouth at bedtime. Take with or immediately following a meal.    [provider]  potassium chloride (K-DUR) 10 MEQ tablet Take 10 mEq by mouth at bedtime.     [provider]  tamsulosin (FLOMAX) 0.4 MG CAPS capsule Take 0.4 mg by mouth daily after breakfast.    [provider]  topiramate (TOPAMAX) 50 MG tablet Take 50 mg by mouth 2 (two) times daily. 06/15/20   [provider]  TRESIBA FLEXTOUCH 200 UNIT/ML SOPN  Inject 24 Units into the skin at bedtime. 08/25/18   [provider]    Allergies    Penicillins  Review of Systems   Review of Systems  Constitutional:  Negative for chills, diaphoresis, fever and unexpected weight change.  Eyes:  Negative for visual disturbance.  Respiratory:  Positive for shortness of breath. Negative for cough.   Cardiovascular:  Positive for leg swelling (baseline). Negative for chest pain.  Gastrointestinal:  Negative for abdominal pain, nausea and vomiting.  Genitourinary:  Negative for difficulty urinating, dysuria, enuresis, frequency, hematuria, penile discharge, penile pain, penile swelling, scrotal swelling and testicular pain.  Musculoskeletal:  Positive for back pain. Negative for neck pain and neck stiffness.  Skin:  Negative for color change and rash.  Neurological:  Negative for dizziness, tremors, seizures, syncope, facial asymmetry, speech difficulty, weakness, light-headedness, numbness and headaches.  Psychiatric/Behavioral:  Negative for confusion.    Physical Exam Updated Vital Signs BP 130/75 (BP Location: Right Arm)    Pulse 90   Temp 98.5 F (36.9 C) (Oral)   Resp 17   Ht 5\' 7"  (1.702 m)   Wt 124.3 kg   SpO2 96%   BMI 42.91 kg/m   Physical Exam Vitals and nursing note reviewed.  Constitutional:      General: He is not in acute distress.    Appearance: He is not ill-appearing, toxic-appearing or diaphoretic.     Comments: Appears uncomfortable due to complaints of back pain  HENT:     Head: Normocephalic and atraumatic. No raccoon eyes, Battle's sign, abrasion, contusion, masses, right periorbital erythema, left periorbital erythema or laceration.     Jaw: No trismus or pain on movement.     Mouth/Throat:     Pharynx: Oropharynx is clear. Uvula midline. No pharyngeal swelling, oropharyngeal exudate, posterior oropharyngeal erythema or uvula swelling.  Eyes:     General: No scleral icterus.       Right eye: No discharge.        Left eye: No discharge.     Extraocular Movements: Extraocular movements intact.     Pupils: Pupils are equal, round, and reactive to light.  Cardiovascular:     Rate and Rhythm: Normal rate.  Pulmonary:     Effort: Pulmonary effort is normal. Tachypnea present. No respiratory distress.     Breath sounds: Normal breath sounds. No stridor. No decreased breath sounds.     Comments: Patient is taking shallow breaths due to complaints of worsening back pain with decannulation Abdominal:     General: There is no distension. There are no signs of injury.     Palpations: Abdomen is soft. There is no mass or pulsatile mass.     Tenderness: There is no abdominal tenderness. There is left CVA tenderness. There is no right CVA tenderness, guarding or rebound.  Musculoskeletal:     Cervical back: Normal range of motion and neck supple. No swelling, edema, deformity, erythema, signs of trauma, lacerations, rigidity, spasms, torticollis, tenderness, bony tenderness or crepitus. No pain with movement, spinous process tenderness or muscular tenderness. Normal range of motion.      Thoracic back: No swelling, edema, deformity, signs of trauma, lacerations, spasms, tenderness or bony tenderness.     Lumbar back: Tenderness (left paraspinal) present. No swelling, edema, deformity, signs of trauma, lacerations, spasms or bony tenderness. Negative right straight leg raise test and negative left straight leg raise test.     Right lower leg: No swelling, deformity, lacerations, tenderness or bony tenderness.  No edema.     Left lower leg: No swelling, deformity, lacerations, tenderness or bony tenderness. No edema.     Comments: No tenderness, bony tenderness, or deformity to bilateral upper and lower extremities  Skin:    General: Skin is warm and dry.     Coloration: Skin is not cyanotic or pale.  Neurological:     General: No focal deficit present.     Mental Status: He is alert.     GCS: GCS eye subscore is 4. GCS verbal subscore is 5. GCS motor subscore is 6.     Cranial Nerves: No cranial nerve deficit or facial asymmetry.     Sensory: Sensation is intact.     Motor: No weakness, tremor, seizure activity or pronator drift.     Coordination: Romberg sign negative. Finger-Nose-Finger Test normal.     Gait: Gait abnormal.     Comments: CN II-XII intact, equal grip strength, +5 strength to bilateral upper and lower extremities, sensation to light touch intact to bilateral upper and lower extremities   Patient walks with antalgic gait due to complaints of left lumbar back pain  Psychiatric:        Behavior: Behavior is cooperative.    ED Results / Procedures / Treatments   Labs (all labs ordered are listed, but only abnormal results are displayed) Labs Reviewed  CBC - Abnormal; Notable for the following components:      Result Value   MCV 101.9 (*)    RDW 15.7 (*)    Platelets 80 (*)    All other components within normal limits  BASIC METABOLIC PANEL - Abnormal; Notable for the following components:   Glucose, Bld 143 (*)    BUN 22 (*)    All other components  within normal limits  D-DIMER, QUANTITATIVE - Abnormal; Notable for the following components:   D-Dimer, Quant >20.00 (*)    All other components within normal limits  URINALYSIS, ROUTINE W REFLEX MICROSCOPIC - Abnormal; Notable for the following components:   Glucose, UA >=500 (*)    All other components within normal limits  RESP PANEL BY RT-PCR (FLU A&B, COVID) ARPGX2  BRAIN NATRIURETIC PEPTIDE  PROTIME-INR  APTT  TROPONIN I (HIGH SENSITIVITY)    EKG EKG Interpretation  Date/Time:  Sunday May 19 2021 21:04:15 EDT Ventricular Rate:  104 PR Interval:  118 QRS Duration: 76 QT Interval:  334 QTC Calculation: 439 R Axis:   6 Text Interpretation: Sinus tachycardia Minimal voltage criteria for LVH, may be normal variant ( R in aVL ) Nonspecific T wave abnormality Abnormal ECG Sinus tachycardia, otherwise similar to previous Confirmed by Lavenia Atlas [phone]) on 05/20/2021 9:13:23 AM  Radiology DG Chest 2 View  Result Date: 05/19/2021 CLINICAL DATA:  Shortness of breath EXAM: CHEST - 2 VIEW COMPARISON:  01/02/2015 FINDINGS: Bibasilar atelectasis. Heart is normal size. No effusions. No acute bony abnormality. IMPRESSION: Bibasilar atelectasis. Electronically Signed   By: Rolm Baptise M.D.   On: 05/19/2021 21:46   CT Angio Chest PE W and/or Wo Contrast  Result Date: 05/20/2021 CLINICAL DATA:  Positive D-dimer. Shortness of breath. Chest pain with deep inspiration. EXAM: CT ANGIOGRAPHY CHEST WITH CONTRAST TECHNIQUE: Multidetector CT imaging of the chest was performed using the standard protocol during bolus administration of intravenous contrast. Multiplanar CT image reconstructions and MIPs were obtained to evaluate the vascular anatomy. CONTRAST:  18mL OMNIPAQUE IOHEXOL 350 MG/ML SOLN COMPARISON:  02/25/2014 FINDINGS: Cardiovascular: Heart size is at the upper limits of  normal. There is coronary artery calcification and aortic atherosclerotic calcification. There is extensive bilateral  pulmonary embolism. Right ventricular to left ventricular ratio is 0.86, approaching significance. Mediastinum/Nodes: No mass or lymphadenopathy. Lungs/Pleura: Upper lobes are clear except for a few small areas of scarring and emphysematous change. There is atelectasis and or infiltrate in the left lower lobe. Minimal atelectasis at the right base. Upper Abdomen: Negative Musculoskeletal: Ordinary spinal degenerative changes. Review of the MIP images confirms the above findings. IMPRESSION: Extensive bilateral pulmonary embolism. Right ventricular to left ventricular ratio is 0.86, approaching the generally accepted level of 0.9 which would indicate right ventricular strain. Electronically Signed   By: Nelson Chimes M.D.   On: 05/20/2021 12:44   CT Renal Stone Study  Result Date: 05/20/2021 CLINICAL DATA:  Flank pain with kidney stone suspected EXAM: CT ABDOMEN AND PELVIS WITHOUT CONTRAST TECHNIQUE: Multidetector CT imaging of the abdomen and pelvis was performed following the standard protocol without IV contrast. COMPARISON:  05/10/2014 FINDINGS: Lower chest: Coronary atherosclerosis. Atelectasis at the lung bases. Hepatobiliary: Regional steatosis in the right liver. No underlying mass or ductal dilatation seen.No evidence of biliary obstruction or stone. Pancreas: Unremarkable. Spleen: Unremarkable. Adrenals/Urinary Tract: Negative adrenals. No hydronephrosis or stone. Unremarkable bladder. Stomach/Bowel:  No obstruction. No appendicitis. Vascular/Lymphatic: No acute vascular abnormality. IVC filter in expected position. Diffuse atheromatous calcification of the aorta and branch vessels. No mass or adenopathy. Reproductive:Seminal vesicle calcifications. Other: No ascites or pneumoperitoneum.  Fatty umbilical hernia. Musculoskeletal: No acute abnormalities. Prominent lumbar facet spurring with ankylosis at L3-4 and below. Multilevel thoracic spondylosis. IMPRESSION: 1. No acute finding.  No hydronephrosis or  ureteral calculus. 2. Right hepatic steatosis. 3. Atherosclerosis including the coronary arteries. Electronically Signed   By: Monte Fantasia M.D.   On: 05/20/2021 10:02   VAS [place] LOWER EXTREMITY VENOUS (DVT)  Result Date: 05/20/2021  Lower Venous DVT Study Patient Name:  DIANGELO RADEL  Date of Exam:   05/20/2021 Medical Rec #: [id number]           Accession #:    [id number] Date of Birth: 04-28-1966            Patient Gender: M Patient Age:   54Y Exam Location:  [facility] Procedure:      VAS [place] LOWER EXTREMITY VENOUS (DVT) Referring Phys: [id number] Orma Flaming --------------------------------------------------------------------------------  Indications: Pulmonary embolism.  Comparison Study: no prior Performing Technologist: Archie Patten RVS  Examination Guidelines: A complete evaluation includes B-mode imaging, spectral Doppler, color Doppler, and power Doppler as needed of all accessible portions of each vessel. Bilateral testing is considered an integral part of a complete examination. Limited examinations for reoccurring indications may be performed as noted. The reflux portion of the exam is performed with the patient in reverse Trendelenburg.  +---------+---------------+---------+-----------+----------+-----------------+ RIGHT    CompressibilityPhasicitySpontaneityPropertiesThrombus Aging    +---------+---------------+---------+-----------+----------+-----------------+ CFV      Full           Yes      Yes                                    +---------+---------------+---------+-----------+----------+-----------------+ SFJ      Full                                                           +---------+---------------+---------+-----------+----------+-----------------+  FV Prox  Full                                                           +---------+---------------+---------+-----------+----------+-----------------+ FV Mid   Full                                                            +---------+---------------+---------+-----------+----------+-----------------+ FV DistalFull                                                           +---------+---------------+---------+-----------+----------+-----------------+ PFV      Full                                                           +---------+---------------+---------+-----------+----------+-----------------+ POP      Full           Yes      Yes                                    +---------+---------------+---------+-----------+----------+-----------------+ PTV      Full                                                           +---------+---------------+---------+-----------+----------+-----------------+ PERO     Full                                                           +---------+---------------+---------+-----------+----------+-----------------+ Gastroc  None                                         Age Indeterminate +---------+---------------+---------+-----------+----------+-----------------+   +---------+---------------+---------+-----------+----------+-------------------+ LEFT     CompressibilityPhasicitySpontaneityPropertiesThrombus Aging      +---------+---------------+---------+-----------+----------+-------------------+ CFV      Full           Yes      Yes                                      +---------+---------------+---------+-----------+----------+-------------------+ SFJ      Full                                                             +---------+---------------+---------+-----------+----------+-------------------+  FV Prox  Full                                                             +---------+---------------+---------+-----------+----------+-------------------+ FV Mid   Full                                                             +---------+---------------+---------+-----------+----------+-------------------+  FV DistalFull                                                             +---------+---------------+---------+-----------+----------+-------------------+ PFV      Full                                                             +---------+---------------+---------+-----------+----------+-------------------+ POP      Full           Yes      Yes                                      +---------+---------------+---------+-----------+----------+-------------------+ PTV                                                   Not well visualized +---------+---------------+---------+-----------+----------+-------------------+ PERO                                                  Not well visualized +---------+---------------+---------+-----------+----------+-------------------+    Summary: RIGHT: - Findings consistent with age indeterminate deep vein thrombosis involving the right gastrocnemius veins. - No cystic structure found in the popliteal fossa.  LEFT: - There is no evidence of deep vein thrombosis in the lower extremity.  - No cystic structure found in the popliteal fossa.  *See table(s) above for measurements and observations.    Preliminary     Procedures .Critical Care  Date/Time: 05/20/2021 5:44 PM Performed by: Loni Beckwith, PA-C Authorized by: Loni Beckwith, PA-C   Critical care provider statement:    Critical care time (minutes):  45   Critical care was necessary to treat or prevent imminent or life-threatening deterioration of the following conditions:  Cardiac failure   Critical care was time spent personally by me on the following activities:  Discussions with consultants, evaluation of patient's response to treatment, examination of patient, ordering and performing treatments and interventions, ordering and review of laboratory studies, ordering and  review of radiographic studies, pulse oximetry, re-evaluation of patient's condition, obtaining history  from patient or surrogate and review of old charts   Care discussed with: admitting provider     Medications Ordered in ED Medications  cyclobenzaprine (FLEXERIL) tablet 5 mg (has no administration in time range)  lidocaine (LIDODERM) 5 % 1 patch (has no administration in time range)  acetaminophen (TYLENOL) tablet 1,000 mg (has no administration in time range)  sodium chloride 0.9 % bolus 1,000 mL (has no administration in time range)    ED Course  I have reviewed the triage vital signs and the nursing notes.  Pertinent labs & imaging results that were available during my care of the patient were reviewed by me and considered in my medical decision making (see chart for details).  Clinical Course as of 05/20/21 1743  Mon May 20, 2021  [id number] spoke to Dr. Eliberto Ivory we will see the patient for admission. [PB]    Clinical Course User Index [PB] Dyann Ruddle   MDM Rules/Calculators/A&P                          Alert 55 year old male no acute distress, nontoxic-appearing.  Patient presents emergency department with a chief complaint of left lumbar back pain and shortness of breath.  Symptoms have been present since Friday 6/10.  Patient reports that shortness of breath is related to his back pain.  When patient takes a deep breath he has worsening pain.  Patient denies any recent heavy lifting, falls, traumatic injuries, IV drug use, cancer history.    Patient denies any bowel or bladder dysfunction, saddle anesthesia, numbness, weakness, visual disturbance, neck pain, headache.  Unexplained weight loss, night sweats, fevers, chills.  Patient has no focal neurological deficit on physical exam.  No midline tenderness or deformity to cervical, thoracic, or lumbar spine.  Low suspicion for cauda equina syndrome or epidural abscess at this time.  Due to patient's shortness of breath and Wells PE score placing him at moderate risk group.  Will obtain D-dimer.    Due to patient's  CVA tenderness location of pain will obtain CT renal study to evaluate for possible renal calculus causing injury.   Abdomen soft, nondistended, nontender.  No mass, pulsatile mass, guarding or rebound tenderness.  Patient hemodynamically stable.  Patient symptoms have been present for multiple days and not sudden in onset.  Mediastinum within normal limits chest x-ray.  Lower suspicion for AAA at this time.    CBC and BMP ordered while patient was in triage. CBC shows platelets at 80.  Appears to be patient's baseline as he has had thrombocytopenia in the past. BMP shows BUN elevated to 22.  Suspect dehydration.  We will give patient IV fluid bolus. Chest x-ray shows bibasilar atelectasis.  Patient reports minimal improvement in his pain after receiving Flexeril, Tylenol and lidocaine patch.  CT renal study shows no acute finding.  No hydronephrosis or ureteral calculus. D-dimer elevated greater than 20.  Will obtain CTA to evaluate for possible PE.  CTA shows extensive bilateral pulmonary embolism.  Right ventricular to left ventricular ratio is 0.86 approaching the generally excepted level of 0.9 which would indicate right ventricular strain.  Will order BMP, troponin, INR, APTT.  Will hold anticoagulation at this point as patient is currently on Eliquis.  Will consult hospitalist for admission.  1326 spoke to Dr. Eliberto Ivory with hospitalist team who agreed to see the patient for admission.  Final Clinical Impression(s) / ED Diagnoses Final diagnoses:  Acute pulmonary embolism, unspecified pulmonary embolism type, unspecified whether acute cor pulmonale present [facility])    Rx / DC Orders ED Discharge Orders     None        Loni Beckwith, PA-C 05/20/21 1748    Lorelle Gibbs, DO 05/23/21 1607

## 2021-05-20 NOTE — ED Notes (Signed)
Accidental echo task clicked off.

## 2021-05-20 NOTE — ED Notes (Signed)
Patient transported to CT 

## 2021-05-20 NOTE — H&P (Signed)
History and Physical    Travis Rollins VEL:[id number] DOB: Jul 07, 1966 DOA: 05/19/2021  PCP: Boyce Medici, FNP Consultants:  cardiology: Dr. Virgina Jock, vascular: Dr. Malissa Hippo, ortho: Dr. Lorin Mercy Patient coming from:  Home - lives with mother and his 2 brothers.   Chief Complaint: left lower back pain and shortness of breath.   HPI: Travis Rollins is a 55 y.o. 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  He had severe left sided lower back pain and shortness of breath. He states he has been breathing very short breaths because it hurts to take deep breaths. He has increased shortness of breath with exertion as well. He was able to do his walks this weekend, but had to take more frequent breaks to catch his breath.  He is also having muscle spasms on the left lower side of this back. He has been chilled, no recorded fevers, no recent travel, he does not smoke and denies any trauma or falls. He walks daily. He denies any acute lower leg swelling. He does have chronic left leg swelling. He recently received his 2nd covid booster on 05/10/21-pfizer.   He denies any N/V/D, abdominal pain, chest pain or palpitations.   ED Course: vitals: BP: 127/91, HR: 90, afebrile, RR: 22, oxygen: 94% on RA.  Rental CT with no acute findings and CTA shows extensive PE with right heart strain. Platelets at 80,  d-dimer: >20, other labs pending. Asked to admit for PE with symptomatic shortness of breath despite being on eliquis with IVC filter.   Review of Systems: As per HPI; otherwise review of systems reviewed and negative.   Ambulatory Status:  Ambulates without assistance   COVID Vaccine Status:  vaccinated and boosted x2. Last booster 05/10/21. Pfizer.   Past Medical History:  Diagnosis Date    Anemia    Carpal tunnel syndrome    Deep vein thrombosis ([place]) 07/2013, 10/2014, 03/16/2015   LLE (studies at [facility])   Depression    Diabetes mellitus    Hyperlipidemia    Hypertension    Obesity    Osteoarthritis    Peripheral vascular disease ([place])    6 yrs ago, DVT in Lt knee/ groin   Pulmonary embolism (HCC)    hx of x 3 per patient; most recent 02/2014   Sinusitis    Sleep apnea     Past Surgical History:  Procedure Laterality Date   CARDIAC CATHETERIZATION N/A 08/10/2015   Procedure: Left Heart Cath and Coronary Angiography;  Surgeon: Adrian Prows, MD;  Location: [facility];  Service: Cardiovascular;  Laterality: N/A;   CYSTOSCOPY WITH RETROGRADE PYELOGRAM, URETEROSCOPY AND STENT PLACEMENT Bilateral 01/03/2015   Procedure: CYSTOSCOPY WITH RETROGRADE PYELOGRAM, RIGHT URETEROSCOPY AND RIGHT STENT PLACEMENT;  Surgeon: Alexis Frock, MD;  Location: [facility];  Service: Urology;  Laterality: Bilateral;   CYSTOSCOPY WITH RETROGRADE PYELOGRAM, URETEROSCOPY AND STENT PLACEMENT Right 03/12/2016   Procedure: CYSTOSCOPY WITH RETROGRADE PYELOGRAM, URETEROSCOPY AND STENT PLACEMENT;  Surgeon: Alexis Frock, MD;  Location: [facility];  Service: Urology;  Laterality: Right;   ENDOVENOUS ABLATION SAPHENOUS VEIN W/ LASER Left 04/20/2018   endovenous laser ablation left greater saphenous vein by Tinnie Gens MD    HOLMIUM LASER APPLICATION Right 2/58/5277   Procedure: HOLMIUM LASER APPLICATION;  Surgeon: Alexis Frock,  MD;  Location: [facility];  Service: Urology;  Laterality: Right;   HOLMIUM LASER APPLICATION Right 05/09/3761   Procedure: HOLMIUM LASER APPLICATION;  Surgeon: Alexis Frock, MD;  Location: [facility];  Service: Urology;  Laterality: Right;   IVC filter   07/2014    TONSILLECTOMY     tubes removd from ears      Social History   Socioeconomic History   Marital status: Single    Spouse name: Not on file   Number of children: 0   Years of education: Not on file   Highest education level:  Not on file  Occupational History   Not on file  Tobacco Use   Smoking status: Never   Smokeless tobacco: Never  Vaping Use   Vaping Use: Never used  Substance and Sexual Activity   Alcohol use: Never   Drug use: No   Sexual activity: Yes    Birth control/protection: Condom  Other Topics Concern   Not on file  Social History Narrative   Not on file   Social Determinants of Health   Financial Resource Strain: Not on file  Food Insecurity: Not on file  Transportation Needs: Not on file  Physical Activity: Not on file  Stress: Not on file  Social Connections: Not on file  Intimate Partner Violence: Not on file    Allergies  Allergen Reactions   Penicillins Hives    Has patient had a PCN reaction causing immediate rash, facial/tongue/throat swelling, SOB or lightheadedness with hypotension: No Has patient had a PCN reaction causing severe rash involving mucus membranes or skin necrosis: No Has patient had a PCN reaction that required hospitalization No Has patient had a PCN reaction occurring within the last 10 years: No If all of the above answers are "NO", then may proceed with Cephalosporin use.    Family History  Problem Relation Age of Onset   Kidney disease Father    Diabetes Father    Colon cancer Neg Hx    Esophageal cancer Neg Hx    Rectal cancer Neg Hx    Stomach cancer Neg Hx     Prior to Admission medications   Medication Sig Start Date End Date Taking? Authorizing Provider  allopurinol (ZYLOPRIM) 100 MG tablet Take 100 mg by mouth daily.   Yes [provider]  apixaban (ELIQUIS) 2.5 MG TABS tablet Take 2.5 mg by mouth 2 (two) times daily.    Yes [provider]  atorvastatin (LIPITOR) 40 MG tablet Take 40 mg by mouth at bedtime.    Yes [provider]  Empagliflozin-metFORMIN HCl 12.04-999 MG TABS Take 1 tablet by mouth daily.   Yes [provider]  furosemide (LASIX) 40 MG tablet Take 40 mg by mouth every morning.     Yes [provider]  liraglutide (VICTOZA) 18 MG/3ML SOPN Inject 18 mg into the skin every morning.   Yes [provider]  lisinopril (PRINIVIL,ZESTRIL) 2.5 MG tablet Take 2.5 mg by mouth daily.   Yes [provider]  metoprolol succinate (TOPROL-XL) 50 MG 24 hr tablet Take 50 mg by mouth at bedtime. Take with or immediately following a meal.   Yes [provider]  polycarbophil (FIBERCON) 625 MG tablet Take 625 mg by mouth in the morning, at noon, and at bedtime.   Yes [provider]  potassium chloride (K-DUR) 10 MEQ tablet Take 10 mEq by mouth at bedtime.    Yes [provider]  tamsulosin (FLOMAX) 0.4 MG CAPS  capsule Take 0.4 mg by mouth daily after breakfast.   Yes [provider]  topiramate (TOPAMAX) 50 MG tablet Take 50 mg by mouth 2 (two) times daily. 06/15/20  Yes [provider]  TRESIBA FLEXTOUCH 200 UNIT/ML SOPN Inject 26 Units into the skin at bedtime. 08/25/18  Yes [provider]  clomiPHENE (CLOMID) 50 MG tablet Take 50 mg by mouth 3 (three) times a week.    [provider]    Physical Exam: Vitals:   05/20/21 1030 05/20/21 1130 05/20/21 1200 05/20/21 1305  BP: 133/90 (!) 133/94 (!) 127/91 (!) 158/114  Pulse: 91 89 90 (!) 102  Resp: (!) 26 18 (!) 22 (!) 31  Temp:      TempSrc:      SpO2: 92% 95% 94% 98%  Weight:      Height:         General:  Appears calm and comfortable and is in NAD Eyes:  PERRL, EOMI, normal lids, iris ENT:  grossly normal hearing, lips & tongue, mmm; missing teeth  Neck:  no LAD, masses or thyromegaly; no carotid bruits Cardiovascular:  RRR, no m/r/g. +LE edema in left lower leg, no pitting.  Respiratory:   CTA bilaterally with no wheezes. +faint crackles in RLL. Normal respiratory effort, slightly tachypneic at times.  Abdomen:  soft, NT, ND, NABS Back:   normal alignment, no CVAT Skin:  no rash or induration seen on limited exam. Dry skin on bilateral LE.   Musculoskeletal:  grossly normal tone BUE/BLE, good ROM, no bony abnormality Lower extremity:  Limited foot exam with no ulcerations.  2+ distal pulses. Psychiatric:  grossly normal mood and affect, speech fluent and appropriate, AOx3 Neurologic:  CN 2-12 grossly intact, moves all extremities in coordinated fashion, sensation intact    Radiological Exams on Admission: Independently reviewed - see discussion in A/P where applicable  DG Chest 2 View  Result Date: 05/19/2021 CLINICAL DATA:  Shortness of breath EXAM: CHEST - 2 VIEW COMPARISON:  01/02/2015 FINDINGS: Bibasilar atelectasis. Heart is normal size. No effusions. No acute bony abnormality. IMPRESSION: Bibasilar atelectasis. Electronically Signed   By: Rolm Baptise M.D.   On: 05/19/2021 21:46   CT Angio Chest PE W and/or Wo Contrast  Result Date: 05/20/2021 CLINICAL DATA:  Positive D-dimer. Shortness of breath. Chest pain with deep inspiration. EXAM: CT ANGIOGRAPHY CHEST WITH CONTRAST TECHNIQUE: Multidetector CT imaging of the chest was performed using the standard protocol during bolus administration of intravenous contrast. Multiplanar CT image reconstructions and MIPs were obtained to evaluate the vascular anatomy. CONTRAST:  19mL OMNIPAQUE IOHEXOL 350 MG/ML SOLN COMPARISON:  02/25/2014 FINDINGS: Cardiovascular: Heart size is at the upper limits of normal. There is coronary artery calcification and aortic atherosclerotic calcification. There is extensive bilateral pulmonary embolism. Right ventricular to left ventricular ratio is 0.86, approaching significance. Mediastinum/Nodes: No mass or lymphadenopathy. Lungs/Pleura: Upper lobes are clear except for a few small areas of scarring and emphysematous change. There is atelectasis and or infiltrate in the left lower lobe. Minimal atelectasis at the right base. Upper Abdomen: Negative Musculoskeletal: Ordinary spinal degenerative changes. Review of the MIP images confirms the above findings.  IMPRESSION: Extensive bilateral pulmonary embolism. Right ventricular to left ventricular ratio is 0.86, approaching the generally accepted level of 0.9 which would indicate right ventricular strain. Electronically Signed   By: Nelson Chimes M.D.   On: 05/20/2021 12:44   CT Renal Stone Study  Result Date: 05/20/2021 CLINICAL DATA:  Flank pain with kidney  stone suspected EXAM: CT ABDOMEN AND PELVIS WITHOUT CONTRAST TECHNIQUE: Multidetector CT imaging of the abdomen and pelvis was performed following the standard protocol without IV contrast. COMPARISON:  05/10/2014 FINDINGS: Lower chest: Coronary atherosclerosis. Atelectasis at the lung bases. Hepatobiliary: Regional steatosis in the right liver. No underlying mass or ductal dilatation seen.No evidence of biliary obstruction or stone. Pancreas: Unremarkable. Spleen: Unremarkable. Adrenals/Urinary Tract: Negative adrenals. No hydronephrosis or stone. Unremarkable bladder. Stomach/Bowel:  No obstruction. No appendicitis. Vascular/Lymphatic: No acute vascular abnormality. IVC filter in expected position. Diffuse atheromatous calcification of the aorta and branch vessels. No mass or adenopathy. Reproductive:Seminal vesicle calcifications. Other: No ascites or pneumoperitoneum.  Fatty umbilical hernia. Musculoskeletal: No acute abnormalities. Prominent lumbar facet spurring with ankylosis at L3-4 and below. Multilevel thoracic spondylosis. IMPRESSION: 1. No acute finding.  No hydronephrosis or ureteral calculus. 2. Right hepatic steatosis. 3. Atherosclerosis including the coronary arteries. Electronically Signed   By: Monte Fantasia M.D.   On: 05/20/2021 10:02    EKG: Independently reviewed. Sinus tachy with rate 104; nonspecific ST changes with no evidence of acute ischemia   Labs on Admission: I have personally reviewed the available labs and imaging studies at the time of the admission.  Pertinent labs:  D-dimer: >20 CT renal: no acute finding.  Atherosclerosis.  CTA: extensive bilateral PE Bnp: 31 Troponin: 26 initial     Assessment/Plan Principal Problem:   Bilateral pulmonary embolism ([place]) -#4 bilateral PE despite eliquis and IVC filter. Compliant with medication. Unsure why on 2.5mg  dosage.  Past PEs were due to medical non compliance with warfarin per notes. Also was on xarelto.  -hematology consulted. Agreed with heparin drip and they will see.  -start heparin drip, pharmacy consult with chronic thrombocytopenia -BNP 31 and ordered echo to determine heart strain -initial troponin 26 second pending. Likely elevated secondary to strain from PE.  -LE DVT ordered -not requiring oxygen, not hypotensive and heart rate normal to mild tachy. Will put him in progressive while we work up extent of PE.  -tele, continuous pulse ox   Active Problems:   Thrombocytopenia ([place]) -chronic history of this. Has never seen hematologist per patient -hematology consulted -heparin drip per pharmacy  -holding statin and allopurinol even though appears to be chronic issue.     DM (diabetes mellitus), type 2 with renal complications (HCC) -K9T pending -continue long acting insulin -resume victoza outpatient.  -hold metformin -SSI/accuchecks/hypoglycemic precautions    Coronary artery disease involving native coronary artery of native heart without angina pectoris -followed by cardiology outpatient -continue beta blocker    Essential hypertension -BP moderately controlled. Ill fitting cuff.  -continue home medication and monitor-lisinopril, toprol-xl.     Mixed hyperlipidemia  -Check lipid panel -hold statin with thromboyctopenia   Venous insufficiency -baseline.  -compression stockings if no DVT on imaging.   Weight management -continue topamax BID  Body mass index is 42.91 kg/m.    Level of care: Progressive DVT prophylaxis:  fully anticoagulated. Heparin drip  Code Status:  Full - confirmed with patient Family  Communication: None present Disposition Plan:  The patient is from: home   Patient is currently: acutely ill Consults called: Hematology: Dr. Marin Olp   Admission status:  inpatient    Orma Flaming MD [facility]   How to contact the [facility] Attending or Consulting provider [place] or covering provider during after hours [place], for this patient?  Check the care team in [facility] and look for a) attending/consulting [place] provider listed and b)  the [facility] team listed Log into [email or web address] and use [place]'s universal password to access. If you do not have the password, please contact the hospital operator. Locate the [facility] provider you are looking for under [facility] and page to a number that you can be directly reached. If you still have difficulty reaching the provider, please page the [facility] (Director on Call) for the Hospitalists listed on amion for assistance.   05/20/2021, 2:12 PM

## 2021-05-20 NOTE — Progress Notes (Signed)
ANTICOAGULATION CONSULT NOTE - Initial Consult  Pharmacy Consult for Heparin Indication: pulmonary embolus  Allergies  Allergen Reactions   Penicillins Hives    Has patient had a PCN reaction causing immediate rash, facial/tongue/throat swelling, SOB or lightheadedness with hypotension: No Has patient had a PCN reaction causing severe rash involving mucus membranes or skin necrosis: No Has patient had a PCN reaction that required hospitalization No Has patient had a PCN reaction occurring within the last 10 years: No If all of the above answers are "NO", then may proceed with Cephalosporin use.    Patient Measurements: Height: 5\' 7"  (170.2 cm) Weight: 124.3 kg (274 lb) IBW/kg (Calculated) : 66.1 Heparin Dosing Weight: 95.1 kg  Vital Signs: Temp: 98.1 F (36.7 C) (06/13 0952) Temp Source: Oral (06/13 0952) BP: 158/114 (06/13 1305) Pulse Rate: 102 (06/13 1305)  Labs: Recent Labs    05/19/21 2059 05/20/21 1300  HGB 15.0  --   HCT 47.6  --   PLT 80*  --   APTT  --  25  LABPROT  --  14.9  INR  --  1.2  CREATININE 1.13  --   TROPONINIHS  --  26*    Estimated Creatinine Clearance: 95.6 mL/min (by C-G formula based on SCr of 1.13 mg/dL).   Medical History: Past Medical History:  Diagnosis Date   Anemia    Carpal tunnel syndrome    Deep vein thrombosis ([place]) 07/2013, 10/2014, 03/16/2015   LLE (studies at [facility])   Depression    Diabetes mellitus    Hyperlipidemia    Hypertension    Obesity    Osteoarthritis    Peripheral vascular disease ([place])    6 yrs ago, DVT in Lt knee/ groin   Pulmonary embolism (HCC)    hx of x 3 per patient; most recent 02/2014   Sinusitis    Sleep apnea     Medications:  Scheduled:   insulin aspart  0-20 Units Subcutaneous TID WC   lidocaine  1 patch Transdermal Q24H   sodium chloride flush  3 mL Intravenous Q12H    Assessment: Patient in the ED with increased work of breathing, pain on deep breaths, . Hgb 15 and PLT low at 80, at  baseline. D-dimer >20 and CT chest positive for extensive bilateral pulmonary embolism.   Patient on apixaban 2.5 bid prior to admission for history of PE/DVT, with last dose 6/12 at 9am. he had his first PE in 2005.  He had his last PE in 2015, at which time, IVC filter was placed.  Due to extensive PE it is reasonable to bolus. Due to recent apixaban (although low dose), thrombocytopenia, and patient with 98% O2sat on room air, will give lower bolus.  Goal of Therapy:  Heparin level 0.3-0.7 units/ml Monitor platelets by anticoagulation protocol: Yes   Plan:  Give 3000 units bolus x 1 Start heparin infusion at 1600 units/hr Check anti-Xa level and APTT in 6-8 hours and daily while on heparin until HL correlate Continue to monitor H&H and platelets  Norina Buzzard, PharmD PGY1 Pharmacy Resident 05/20/2021 2:46 PM

## 2021-05-21 ENCOUNTER — Other Ambulatory Visit: Payer: Self-pay

## 2021-05-21 ENCOUNTER — Inpatient Hospital Stay (HOSPITAL_COMMUNITY): Payer: Medicare (Managed Care)

## 2021-05-21 ENCOUNTER — Encounter (HOSPITAL_COMMUNITY): Payer: Self-pay | Admitting: Family Medicine

## 2021-05-21 DIAGNOSIS — I2609 Other pulmonary embolism with acute cor pulmonale: Secondary | ICD-10-CM

## 2021-05-21 DIAGNOSIS — D696 Thrombocytopenia, unspecified: Secondary | ICD-10-CM

## 2021-05-21 DIAGNOSIS — D6859 Other primary thrombophilia: Secondary | ICD-10-CM

## 2021-05-21 DIAGNOSIS — E119 Type 2 diabetes mellitus without complications: Secondary | ICD-10-CM

## 2021-05-21 DIAGNOSIS — I10 Essential (primary) hypertension: Secondary | ICD-10-CM | POA: Diagnosis not present

## 2021-05-21 DIAGNOSIS — I2510 Atherosclerotic heart disease of native coronary artery without angina pectoris: Secondary | ICD-10-CM | POA: Diagnosis not present

## 2021-05-21 DIAGNOSIS — E782 Mixed hyperlipidemia: Secondary | ICD-10-CM

## 2021-05-21 DIAGNOSIS — Z794 Long term (current) use of insulin: Secondary | ICD-10-CM

## 2021-05-21 LAB — ECHOCARDIOGRAM COMPLETE
AR max vel: 3.22 cm2
AV Area VTI: 3.26 cm2
AV Area mean vel: 2.82 cm2
AV Mean grad: 1 mmHg
AV Peak grad: 2.6 mmHg
Ao pk vel: 0.81 m/s
Area-P 1/2: 5.38 cm2
Height: 67 in
S' Lateral: 3.5 cm
Weight: 4384 oz

## 2021-05-21 LAB — CBC WITH DIFFERENTIAL/PLATELET
Abs Immature Granulocytes: 0.03 10*3/uL (ref 0.00–0.07)
Basophils Absolute: 0 10*3/uL (ref 0.0–0.1)
Basophils Relative: 0 %
Eosinophils Absolute: 0.1 10*3/uL (ref 0.0–0.5)
Eosinophils Relative: 1 %
HCT: 42.8 % (ref 39.0–52.0)
Hemoglobin: 13.7 g/dL (ref 13.0–17.0)
Immature Granulocytes: 1 %
Lymphocytes Relative: 19 %
Lymphs Abs: 1.2 10*3/uL (ref 0.7–4.0)
MCH: 31.9 pg (ref 26.0–34.0)
MCHC: 32 g/dL (ref 30.0–36.0)
MCV: 99.5 fL (ref 80.0–100.0)
Monocytes Absolute: 0.9 10*3/uL (ref 0.1–1.0)
Monocytes Relative: 14 %
Neutro Abs: 4 10*3/uL (ref 1.7–7.7)
Neutrophils Relative %: 65 %
Platelets: 74 10*3/uL — ABNORMAL LOW (ref 150–400)
RBC: 4.3 MIL/uL (ref 4.22–5.81)
RDW: 15.6 % — ABNORMAL HIGH (ref 11.5–15.5)
WBC: 6.2 10*3/uL (ref 4.0–10.5)
nRBC: 0 % (ref 0.0–0.2)

## 2021-05-21 LAB — COMPREHENSIVE METABOLIC PANEL
ALT: 17 U/L (ref 0–44)
AST: 16 U/L (ref 15–41)
Albumin: 3.1 g/dL — ABNORMAL LOW (ref 3.5–5.0)
Alkaline Phosphatase: 58 U/L (ref 38–126)
Anion gap: 10 (ref 5–15)
BUN: 23 mg/dL — ABNORMAL HIGH (ref 6–20)
CO2: 23 mmol/L (ref 22–32)
Calcium: 8.6 mg/dL — ABNORMAL LOW (ref 8.9–10.3)
Chloride: 106 mmol/L (ref 98–111)
Creatinine, Ser: 1.04 mg/dL (ref 0.61–1.24)
GFR, Estimated: >60 mL/min (ref >60)
Glucose, Bld: 122 mg/dL — ABNORMAL HIGH (ref 70–99)
Potassium: 3.9 mmol/L (ref 3.5–5.1)
Sodium: 139 mmol/L (ref 135–145)
Total Bilirubin: 0.9 mg/dL (ref 0.3–1.2)
Total Protein: 7.3 g/dL (ref 6.5–8.1)

## 2021-05-21 LAB — HEPARIN LEVEL (UNFRACTIONATED): Heparin Unfractionated: 0.36 IU/mL (ref 0.30–0.70)

## 2021-05-21 LAB — APTT
aPTT: 50 seconds — ABNORMAL HIGH (ref 24–36)
aPTT: 67 seconds — ABNORMAL HIGH (ref 24–36)
aPTT: 53 seconds — ABNORMAL HIGH (ref 24–36)

## 2021-05-21 LAB — GLUCOSE, CAPILLARY
Glucose-Capillary: 113 mg/dL — ABNORMAL HIGH (ref 70–99)
Glucose-Capillary: 125 mg/dL — ABNORMAL HIGH (ref 70–99)
Glucose-Capillary: 116 mg/dL — ABNORMAL HIGH (ref 70–99)
Glucose-Capillary: 167 mg/dL — ABNORMAL HIGH (ref 70–99)

## 2021-05-21 LAB — SAVE SMEAR(SSMR), FOR PROVIDER SLIDE REVIEW

## 2021-05-21 MED ORDER — POTASSIUM CHLORIDE CRYS ER 10 MEQ PO TBCR
10.0000 meq | EXTENDED_RELEASE_TABLET | Freq: Every day | ORAL | Status: DC
  Administered 2021-05-21 – 2021-05-29 (×10): 10 meq via ORAL
  Filled 2021-05-21 (×10): qty 1

## 2021-05-21 MED ORDER — INSULIN GLARGINE 100 UNIT/ML ~~LOC~~ SOLN
26.0000 [IU] | Freq: Every day | SUBCUTANEOUS | Status: DC
  Administered 2021-05-21: 26 [IU] via SUBCUTANEOUS
  Filled 2021-05-21 (×2): qty 0.26

## 2021-05-21 MED ORDER — HEPARIN BOLUS VIA INFUSION
2400.0000 [IU] | Freq: Once | INTRAVENOUS | Status: AC
  Administered 2021-05-21: 2400 [IU] via INTRAVENOUS
  Filled 2021-05-21: qty 2400

## 2021-05-21 MED ORDER — INSULIN GLARGINE 100 UNIT/ML ~~LOC~~ SOLN
13.0000 [IU] | Freq: Every day | SUBCUTANEOUS | Status: DC
  Administered 2021-05-21 – 2021-05-29 (×9): 13 [IU] via SUBCUTANEOUS
  Filled 2021-05-21 (×10): qty 0.13

## 2021-05-21 MED ORDER — TAMSULOSIN HCL 0.4 MG PO CAPS
0.4000 mg | ORAL_CAPSULE | Freq: Every day | ORAL | Status: DC
  Administered 2021-05-21 – 2021-05-30 (×10): 0.4 mg via ORAL
  Filled 2021-05-21 (×10): qty 1

## 2021-05-21 MED ORDER — METOPROLOL SUCCINATE ER 50 MG PO TB24
50.0000 mg | ORAL_TABLET | Freq: Every day | ORAL | Status: DC
  Administered 2021-05-21 – 2021-05-29 (×10): 50 mg via ORAL
  Filled 2021-05-21 (×10): qty 1

## 2021-05-21 MED ORDER — FUROSEMIDE 40 MG PO TABS
40.0000 mg | ORAL_TABLET | Freq: Every morning | ORAL | Status: DC
  Administered 2021-05-21 – 2021-05-30 (×10): 40 mg via ORAL
  Filled 2021-05-21 (×10): qty 1

## 2021-05-21 MED ORDER — TOPIRAMATE 25 MG PO TABS
50.0000 mg | ORAL_TABLET | Freq: Two times a day (BID) | ORAL | Status: DC
  Administered 2021-05-21 – 2021-05-30 (×20): 50 mg via ORAL
  Filled 2021-05-21 (×20): qty 2

## 2021-05-21 MED ORDER — HEPARIN BOLUS VIA INFUSION
2000.0000 [IU] | Freq: Once | INTRAVENOUS | Status: AC
  Administered 2021-05-21: 2000 [IU] via INTRAVENOUS
  Filled 2021-05-21: qty 2000

## 2021-05-21 MED ORDER — LISINOPRIL 5 MG PO TABS
2.5000 mg | ORAL_TABLET | Freq: Every day | ORAL | Status: DC
  Administered 2021-05-21 – 2021-05-30 (×10): 2.5 mg via ORAL
  Filled 2021-05-21 (×10): qty 1

## 2021-05-21 MED ORDER — CALCIUM POLYCARBOPHIL 625 MG PO TABS
625.0000 mg | ORAL_TABLET | Freq: Every day | ORAL | Status: DC
  Administered 2021-05-21 – 2021-05-30 (×9): 625 mg via ORAL
  Filled 2021-05-21 (×10): qty 1

## 2021-05-21 MED ORDER — PERFLUTREN LIPID MICROSPHERE
1.0000 mL | INTRAVENOUS | Status: AC | PRN
  Administered 2021-05-21: 2 mL via INTRAVENOUS
  Filled 2021-05-21: qty 10

## 2021-05-21 MED ORDER — IOHEXOL 350 MG/ML SOLN
100.0000 mL | Freq: Once | INTRAVENOUS | Status: AC | PRN
  Administered 2021-05-21: 100 mL via INTRAVENOUS

## 2021-05-21 NOTE — Progress Notes (Signed)
ANTICOAGULATION CONSULT NOTE - Initial Consult  Pharmacy Consult for Heparin Indication: pulmonary embolus  Allergies  Allergen Reactions   Penicillins Hives    Has patient had a PCN reaction causing immediate rash, facial/tongue/throat swelling, SOB or lightheadedness with hypotension: No Has patient had a PCN reaction causing severe rash involving mucus membranes or skin necrosis: No Has patient had a PCN reaction that required hospitalization No Has patient had a PCN reaction occurring within the last 10 years: No If all of the above answers are "NO", then may proceed with Cephalosporin use.    Patient Measurements: Height: 5\' 7"  (170.2 cm) Weight: 124.3 kg (274 lb) IBW/kg (Calculated) : 66.1 Heparin Dosing Weight: 95.1 kg  Vital Signs: Temp: 98 F (36.7 C) (06/14 0728) Temp Source: Oral (06/14 0728) BP: 118/82 (06/14 0728) Pulse Rate: 87 (06/14 0728)  Labs: Recent Labs    05/19/21 2059 05/20/21 1300 05/20/21 1455 05/20/21 1903 05/21/21 0222  HGB 15.0  --   --   --  13.7  HCT 47.6  --   --   --  42.8  PLT 80*  --   --   --  74*  APTT  --  25  --  41* 50*  LABPROT  --  14.9  --   --   --   INR  --  1.2  --   --   --   HEPARINUNFRC  --   --   --   --  0.36  CREATININE 1.13  --   --   --  1.04  TROPONINIHS  --  26* 22*  --   --      Estimated Creatinine Clearance: 103.9 mL/min (by C-G formula based on SCr of 1.04 mg/dL).   Medical History: Past Medical History:  Diagnosis Date   Anemia    Carpal tunnel syndrome    Deep vein thrombosis ([place]) 07/2013, 10/2014, 03/16/2015   LLE (studies at [facility])   Depression    Diabetes mellitus    Hyperlipidemia    Hypertension    Obesity    Osteoarthritis    Peripheral vascular disease ([place])    6 yrs ago, DVT in Lt knee/ groin   Pulmonary embolism (HCC)    hx of x 3 per patient; most recent 02/2014   Sinusitis    Sleep apnea     Medications:  Scheduled:   furosemide  40 mg Oral q morning   insulin aspart  0-20  Units Subcutaneous TID WC   insulin glargine  26 Units Subcutaneous QHS   lidocaine  1 patch Transdermal Q24H   lisinopril  2.5 mg Oral Daily   metoprolol succinate  50 mg Oral QHS   polycarbophil  625 mg Oral Daily   potassium chloride  10 mEq Oral QHS   sodium chloride flush  3 mL Intravenous Q12H   tamsulosin  0.4 mg Oral QPC breakfast   topiramate  50 mg Oral BID    Assessment: Patient in the ED with increased work of breathing, pain on deep breaths, D-dimer >20 and CT chest positive for extensive bilateral pulmonary embolism.   Patient on apixaban 2.5 bid prior to admission for history of PE/DVT, with last dose 6/12 at 9am. he had his first PE in 2005.  He had his last PE in 2015, at which time, IVC filter was placed.  Hgb 13.7 and PLT remaining low at 74.   Heparin Levels (Goal: HL 0.3-0.7; aPTT 66-102 seconds): aPTT initial 25 sec  6/13 1903: aPTT 41 sec 6/14 0222: aPTT 50 sec - 2000 unit bolus and increased to 1900 units/kg/hr 6/14 1038: aPTT 67 sec which is borderline therapeutic.  No issues noted with heparin gtt overnight and no new signs of bleeding (confirmed with RN)  Due to extensive PE it is reasonable to provide adjustment boluses.  Goal of Therapy:  Heparin level 0.3-0.7 units/ml Monitor platelets by anticoagulation protocol: Yes   Plan:  Continue heparin infusion at 1900 units/hr Check APTT in 6 hours and aPTT /heparin level daily while on heparin until HL correlates Continue to monitor H&H and platelets  Lorelei Pont, PharmD, BCPS 05/21/2021 9:13 AM ED Clinical Pharmacist -  [phone]

## 2021-05-21 NOTE — Progress Notes (Signed)
Pt admitted from ED to 2W-20 for PE management. Pt is A&O x4 and denies pain with the exception of deep inspiration which causes L-lower back pain. Pt is on 2LNC with SpO2: 99%. RR are even and unlabored. Is on Heparin gtt @ 1600 units/hr. Pt educated on call bell use and fall prevention strategies and has verbalized understanding. Will continue to monitor.

## 2021-05-21 NOTE — ED Notes (Signed)
Report called to RN receiving pt. 

## 2021-05-21 NOTE — Progress Notes (Signed)
ANTICOAGULATION CONSULT NOTE - Follow Up Consult  Pharmacy Consult for heparin Indication: pulmonary embolus   Labs: Recent Labs    05/19/21 2059 05/20/21 1300 05/20/21 1455 05/20/21 1903 05/21/21 0222  HGB 15.0  --   --   --  13.7  HCT 47.6  --   --   --  42.8  PLT 80*  --   --   --  74*  APTT  --  25  --  41* 50*  LABPROT  --  14.9  --   --   --   INR  --  1.2  --   --   --   HEPARINUNFRC  --   --   --   --  0.36  CREATININE 1.13  --   --   --   --   TROPONINIHS  --  26* 22*  --   --     Assessment: 54yo male subtherapeutic on heparin with initial dosing for acute PE despite low-dose Eliquis for Px; no gtt issues or signs of bleeding per RN.  Goal of Therapy:  aPTT 66-102 seconds   Plan:  Will give small bolus of heparin 2000 units and increase heparin gtt by 3 units/kgABW/hr to 1900 units/hr and check PTT in 6 hours.    Wynona Neat, PharmD, BCPS  05/21/2021,4:28 AM

## 2021-05-21 NOTE — Plan of Care (Signed)
?  Problem: Education: ?Goal: Knowledge of General Education information will improve ?Description: Including pain rating scale, medication(s)/side effects and non-pharmacologic comfort measures ?Outcome: Progressing ?  ?Problem: Health Behavior/Discharge Planning: ?Goal: Ability to manage health-related needs will improve ?Outcome: Progressing ?  ?Problem: Clinical Measurements: ?Goal: Ability to maintain clinical measurements within normal limits will improve ?Outcome: Progressing ?Goal: Will remain free from infection ?Outcome: Progressing ?Goal: Respiratory complications will improve ?Outcome: Progressing ?Goal: Cardiovascular complication will be avoided ?Outcome: Progressing ?  ?Problem: Activity: ?Goal: Risk for activity intolerance will decrease ?Outcome: Progressing ?  ?Problem: Coping: ?Goal: Level of anxiety will decrease ?Outcome: Progressing ?  ?Problem: Elimination: ?Goal: Will not experience complications related to bowel motility ?Outcome: Progressing ?Goal: Will not experience complications related to urinary retention ?Outcome: Progressing ?  ?Problem: Pain Managment: ?Goal: General experience of comfort will improve ?Outcome: Progressing ?  ?Problem: Safety: ?Goal: Ability to remain free from injury will improve ?Outcome: Progressing ?  ?Problem: Skin Integrity: ?Goal: Risk for impaired skin integrity will decrease ?Outcome: Progressing ?  ?

## 2021-05-21 NOTE — Progress Notes (Signed)
[name] is feeling overall better.  He does not feel short of breath.  He is on a heparin infusion.  I did look at his blood smear this morning.  He has numerous large platelets.  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  I think will be interesting to see what his lupus anticoagulant studies show.  I still wonder if thrombolytic therapy would not be a bad idea for him given that he has had multiple blood clots and there are not as many options for him.  His labs this morning show white cell count is 6.2.  Hemoglobin 13.7.  Platelet count 74,000.  His BUN is 23 creatinine 1.04.  His appetite seems to be doing okay.  There is no bleeding.  His vital signs all look pretty stable.  His temperature is 98.2.  Pulse 91.  Blood pressure 128/89.  His lungs are relatively clear bilaterally.  Cardiac exam regular rate and rhythm.  He has no murmurs.  Abdomen is soft.  He is mildly obese.  Extremity shows no cyanosis or edema.  He has no obvious venous cord that is palpable.  Neurological exam is nonfocal.  He is on a heparin infusion right now.  Again I think the option at this point is going to be something that is subcutaneous.  I really hate that we have to go down that road.  However, has been on multiple different anticoagulants.  I would see if interventional radiology feels that he would be a candidate for some type of thrombolytic therapy.  I also wonder if vascular surgery would consider him for a thrombectomy.  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  I know that he is feeling better.  Again, I would be nice to try to "debulk" the thrombotic burden and I think this would help long-term anticoagulation.  I know he is getting wonderful care from all the staff [facility] [address]   Lattie Haw, MD  [name] 30:18

## 2021-05-21 NOTE — Progress Notes (Addendum)
PROGRESS NOTE    Travis Rollins  XHB:[id number] DOB: 1966/08/04 DOA: 05/19/2021 PCP: Boyce Medici, FNP    Chief Complaint  Patient presents with   Back Pain   Shortness of Breath    Brief Narrative:  Patient is a 55 year old gentleman history of recurrent PEs was on Coumadin, Xarelto, Pradaxa, and currently on apixaban presented to the ED with left lower back pain and shortness of breath which has been worsening x5 days.  Patient status post recent COVID-19 booster 05/10/2021.  CT angiogram chest performed with extensive bilateral PE with right ventricular strain.  CT renal stone protocol with hepatic steatosis otherwise unremarkable.  Lower extremity Dopplers done with age  indeterminate DVT of right gastrocnemius vein.  2D echo ordered and pending.  Hematology consulted patient seen in consultation by Dr. Marin Olp who is recommending evaluation by IR or vascular surgery for evaluation for thrombolytic therapy.  Also concern for IVC filter being clogged with clots.  Anticoagulation panel pending.  PCCM and IR consulted for further evaluation and management.   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 -Patient noted to have had a history of PE status post IVC filter, has been on anticoagulation with Coumadin, Pradaxa, Xarelto, apixaban and still developing recurrent clots. -CT angiogram done with extensive bilateral PE with right ventricular strain. -2D echo pending. -Lower extremity Dopplers with age-indeterminate right gastrocnemius vein DVT. -Hypercoagulable work-up underway. -Patient currently on  IV heparin which we will continue for now. -Patient seen in consultation by hematology and feel patient likely will require low molecular weight heparin on discharge. -Hematology requesting IR evaluation to see whether patient is a candidate for thrombolytic therapy to decrease clot burden. -CT IV venogram ordered. -PCCM consulted for further evaluation and management. -Appreciate IR, PCCM, hematology input and recommendations.  2.  Chronic thrombocytopenia -Patient noted never have to seen a hematologist in the past. -Patient with no overt bleeding. -Currently on heparin drip. -Statin and allopurinol on hold. -Per hematology.  3.  Diabetes mellitus type 2 -Hemoglobin A1c 5.9 (03/12/2016) -Repeat hemoglobin A1c pending -CBG 113 this morning. -Continue CBG before meals and at bedtime -Decrease Lantus dose to 13 units daily as patient to be n.p.o. after midnight.   -SSI.  4.  Coronary artery disease -Stable -2D echo pending -Continue beta-blocker.  5.  Hypertension -BP stable. -Monitor blood pressure secondary to problem #1 -Continue lisinopril, Toprol-XL.  6.  Hyperlipidemia -Statin on hold secondary to thrombocytopenia  7.  Venous insufficiency -Follow.  8.  Weight management -Continue Topamax.   DVT prophylaxis: Heparin Code Status: Full Family Communication: Updated patient.  No family at bedside. Disposition:   Status is: Inpatient  Remains inpatient appropriate because:Ongoing diagnostic testing needed not appropriate for outpatient work up  Dispo: The patient is from: Home              Anticipated d/c is to: Home              Patient currently is not medically stable to d/c.   Difficult to place patient No       Consultants:  Hematology: Dr. Marin Olp 05/20/2021 PCCM IR  Procedures:  CT  angiogram chest 05/20/2021 CT renal stone protocol 05/20/2021 Chest x-ray 05/19/2021 2D echo 05/21/2021 pending Lower extremity Doppler 05/20/2021 CT venogram pending  05/21/2021     Antimicrobials:  None   Subjective: Patient laying in bed.  States breath/shallow today however still shallow.  Occasional diffuse body cramping.  Some chest pain.  Overall feeling a little bit better than on admission.  Objective: Vitals:   05/21/21 0256 05/21/21 0257 05/21/21 0728 05/21/21 1147  BP: 128/89  118/82 (!) 163/106  Pulse: 87 91 87 86  Resp: (!) 27 20 20 20   Temp: 98.2 F (36.8 C)  98 F (36.7 C) 98.1 F (36.7 C)  TempSrc: Oral  Oral Oral  SpO2: 100% 100% 100% 97%  Weight:      Height:        Intake/Output Summary (Last 24 hours) at 05/21/2021 1243 Last data filed at 05/20/2021 1636 Gross per 24 hour  Intake 1000 ml  Output --  Net 1000 ml   Filed Weights   05/19/21 2058  Weight: 124.3 kg    Examination:  General exam: Appears calm and comfortable  Respiratory system: Clear to auscultation anterior lung fields.  Shallow breath sounds.  No wheezing.  No crackles noted.Marland Kitchen Respiratory effort normal. Cardiovascular system: S1 & S2 heard, RRR. No JVD, murmurs, rubs, gallops or clicks. No pedal edema. Gastrointestinal system: Abdomen is nondistended, soft and nontender. No organomegaly or masses felt. Normal bowel sounds heard. Central nervous system: Alert and oriented. No focal neurological deficits. Extremities: Symmetric 5 x 5 power. Skin: No rashes, lesions or ulcers Psychiatry: Judgement and insight appear normal. Mood & affect appropriate.     Data Reviewed: I have personally reviewed following labs and imaging studies  CBC: Recent Labs  Lab 05/19/21 2059 05/21/21 0222  WBC 6.9 6.2  NEUTROABS  --  4.0  HGB 15.0 13.7  HCT 47.6 42.8  MCV 101.9* 99.5  PLT 80* 74*    Basic Metabolic Panel: Recent Labs  Lab 05/19/21 2059 05/21/21 0222  NA 139 139  K 4.4 3.9  CL 105 106  CO2 25 23  GLUCOSE 143* 122*  BUN 22* 23*  CREATININE 1.13 1.04  CALCIUM 9.3 8.6*    GFR: Estimated Creatinine Clearance: 103.9 mL/min (by C-G  formula based on SCr of 1.04 mg/dL).  Liver Function Tests: Recent Labs  Lab 05/21/21 0222  AST 16  ALT 17  ALKPHOS 58  BILITOT 0.9  PROT 7.3  ALBUMIN 3.1*    CBG: Recent Labs  Lab 05/20/21 1820 05/20/21 2240 05/21/21 0808 05/21/21 1206  GLUCAP 114* 130* 113* 125*     Recent Results (from the past 240 hour(s))  Resp Panel by RT-PCR (Flu A&B, Covid) Nasopharyngeal Swab     Status: None   Collection Time: 05/20/21  1:08 PM   Specimen: Nasopharyngeal Swab; Nasopharyngeal(NP) swabs in vial transport medium  Result Value Ref Range Status   SARS Coronavirus 2 by RT PCR NEGATIVE NEGATIVE Final    Comment: (NOTE) SARS-CoV-2 target nucleic acids are NOT DETECTED.  The SARS-CoV-2 RNA is generally detectable in upper respiratory specimens during the acute phase of infection. The lowest concentration of SARS-CoV-2 viral copies this assay can detect is 138 copies/mL. A negative result does not preclude SARS-Cov-2 infection and should not be used as the sole basis for treatment or other patient management decisions. A negative result may occur with  improper specimen collection/handling, submission of specimen other than nasopharyngeal swab, presence of viral mutation(s) within the areas  targeted by this assay, and inadequate number of viral copies(<138 copies/mL). A negative result must be combined with clinical observations, patient history, and epidemiological information. The expected result is Negative.  Fact Sheet for Patients:  EntrepreneurPulse.com.au  Fact Sheet for Healthcare Providers:  IncredibleEmployment.be  This test is no t yet approved or cleared by the [place] FDA and  has been authorized for detection and/or diagnosis of SARS-CoV-2 by FDA under an Emergency Use Authorization (EUA). This EUA will remain  in effect (meaning this test can be used) for the duration of the COVID-19 declaration under Section 564(b)(1) of  the Act, 21 U.S.C.section 360bbb-3(b)(1), unless the authorization is terminated  or revoked sooner.       Influenza A by PCR NEGATIVE NEGATIVE Final   Influenza B by PCR NEGATIVE NEGATIVE Final    Comment: (NOTE) The Xpert Xpress SARS-CoV-2/FLU/RSV plus assay is intended as an aid in the diagnosis of influenza from Nasopharyngeal swab specimens and should not be used as a sole basis for treatment. Nasal washings and aspirates are unacceptable for Xpert Xpress SARS-CoV-2/FLU/RSV testing.  Fact Sheet for Patients: EntrepreneurPulse.com.au  Fact Sheet for Healthcare Providers: IncredibleEmployment.be  This test is not yet approved or cleared by the [place] FDA and has been authorized for detection and/or diagnosis of SARS-CoV-2 by FDA under an Emergency Use Authorization (EUA). This EUA will remain in effect (meaning this test can be used) for the duration of the COVID-19 declaration under Section 564(b)(1) of the Act, 21 U.S.C. section 360bbb-3(b)(1), unless the authorization is terminated or revoked.  Performed at [facility], [place] [address], [place], [place] [address]          Radiology Studies: DG Chest 2 View  Result Date: 05/19/2021 CLINICAL DATA:  Shortness of breath EXAM: CHEST - 2 VIEW COMPARISON:  01/02/2015 FINDINGS: Bibasilar atelectasis. Heart is normal size. No effusions. No acute bony abnormality. IMPRESSION: Bibasilar atelectasis. Electronically Signed   By: Rolm Baptise M.D.   On: 05/19/2021 21:46   CT Angio Chest PE W and/or Wo Contrast  Result Date: 05/20/2021 CLINICAL DATA:  Positive D-dimer. Shortness of breath. Chest pain with deep inspiration. EXAM: CT ANGIOGRAPHY CHEST WITH CONTRAST TECHNIQUE: Multidetector CT imaging of the chest was performed using the standard protocol during bolus administration of intravenous contrast. Multiplanar CT image reconstructions and MIPs were obtained to evaluate the  vascular anatomy. CONTRAST:  55mL OMNIPAQUE IOHEXOL 350 MG/ML SOLN COMPARISON:  02/25/2014 FINDINGS: Cardiovascular: Heart size is at the upper limits of normal. There is coronary artery calcification and aortic atherosclerotic calcification. There is extensive bilateral pulmonary embolism. Right ventricular to left ventricular ratio is 0.86, approaching significance. Mediastinum/Nodes: No mass or lymphadenopathy. Lungs/Pleura: Upper lobes are clear except for a few small areas of scarring and emphysematous change. There is atelectasis and or infiltrate in the left lower lobe. Minimal atelectasis at the right base. Upper Abdomen: Negative Musculoskeletal: Ordinary spinal degenerative changes. Review of the MIP images confirms the above findings. IMPRESSION: Extensive bilateral pulmonary embolism. Right ventricular to left ventricular ratio is 0.86, approaching the generally accepted level of 0.9 which would indicate right ventricular strain. Electronically Signed   By: Nelson Chimes M.D.   On: 05/20/2021 12:44   ECHOCARDIOGRAM COMPLETE  Result Date: 05/21/2021    ECHOCARDIOGRAM REPORT   Patient Name:   Travis Rollins Date of Exam: 05/21/2021 Medical Rec #:  [id number]          Height:  67.0 in Accession #:    [id number]         Weight:       274.0 lb Date of Birth:  [birth year]           BSA:          2.312 m Patient Age:    54 years           BP:           118/82 mmHg Patient Gender: M                  HR:           90 bpm. Exam Location:  Inpatient Procedure: 2D Echo, Cardiac Doppler, Color Doppler and Intracardiac            Opacification Agent Indications:    Pulmonary embolism  History:        Patient has prior history of Echocardiogram examinations, most                 recent 09/11/2020. PAD; Risk Factors:Diabetes, Hypertension and                 Dyslipidemia.  Sonographer:    Maudry Mayhew MHA, RDMS, RVT, RDCS Referring Phys: [id number] [facility]  Sonographer Comments: Technically difficult  study due to poor echo windows and patient is morbidly obese. Image acquisition challenging due to patient body habitus and Image acquisition challenging due to respiratory motion. IMPRESSIONS  1. Left ventricular ejection fraction, by estimation, is 60 to 65%. The left ventricle has normal function. The left ventricle has no regional wall motion abnormalities. Left ventricular diastolic parameters were normal.  2. Right ventricular systolic function is normal. The right ventricular size is normal.  3. The mitral valve is normal in structure. No evidence of mitral valve regurgitation. No evidence of mitral stenosis.  4. The aortic valve has an indeterminant number of cusps. Aortic valve regurgitation is not visualized. No aortic stenosis is present.  5. The inferior vena cava is dilated in size with >50% respiratory variability, suggesting right atrial pressure of 8 mmHg. FINDINGS  Left Ventricle: Left ventricular ejection fraction, by estimation, is 60 to 65%. The left ventricle has normal function. The left ventricle has no regional wall motion abnormalities. Definity contrast agent was given IV to delineate the left ventricular  endocardial borders. The left ventricular internal cavity size was normal in size. There is no left ventricular hypertrophy. Left ventricular diastolic parameters were normal. Right Ventricle: The right ventricular size is normal. Right ventricular systolic function is normal. Left Atrium: Left atrial size was normal in size. Right Atrium: Right atrial size was normal in size. Pericardium: There is no evidence of pericardial effusion. Mitral Valve: The mitral valve is normal in structure. No evidence of mitral valve regurgitation. No evidence of mitral valve stenosis. Tricuspid Valve: The tricuspid valve is normal in structure. Tricuspid valve regurgitation is trivial. No evidence of tricuspid stenosis. Aortic Valve: The aortic valve has an indeterminant number of cusps. Aortic valve  regurgitation is not visualized. No aortic stenosis is present. Aortic valve mean gradient measures 1.0 mmHg. Aortic valve peak gradient measures 2.6 mmHg. Aortic valve area, by VTI measures 3.26 cm. Pulmonic Valve: The pulmonic valve was not well visualized. Pulmonic valve regurgitation is not visualized. No evidence of pulmonic stenosis. Aorta: The aortic root is normal in size and structure. Venous: The inferior vena cava is dilated in size with greater than 50% respiratory variability, suggesting  right atrial pressure of 8 mmHg.  LEFT VENTRICLE PLAX 2D LVIDd:         4.50 cm  Diastology LVIDs:         3.50 cm  LV e' medial:    9.57 cm/s LV PW:         0.70 cm  LV E/e' medial:  8.9 LV IVS:        0.60 cm  LV e' lateral:   9.68 cm/s LVOT diam:     1.90 cm  LV E/e' lateral: 8.8 LV SV:         48 LV SV Index:   21 LVOT Area:     2.84 cm  RIGHT VENTRICLE RV S prime:     10.00 cm/s TAPSE (M-mode): 2.3 cm LEFT ATRIUM             Index       RIGHT ATRIUM           Index LA diam:        3.40 cm 1.47 cm/m  RA Area:     12.70 cm LA Vol (A2C):   27.7 ml 11.98 ml/m RA Volume:   31.30 ml  13.54 ml/m LA Vol (A4C):   47.3 ml 20.46 ml/m LA Biplane Vol: 36.6 ml 15.83 ml/m  AORTIC VALVE AV Area (Vmax):    3.22 cm AV Area (Vmean):   2.82 cm AV Area (VTI):     3.26 cm AV Vmax:           81.00 cm/s AV Vmean:          56.500 cm/s AV VTI:            0.147 m AV Peak Grad:      2.6 mmHg AV Mean Grad:      1.0 mmHg LVOT Vmax:         92.00 cm/s LVOT Vmean:        56.200 cm/s LVOT VTI:          0.169 m LVOT/AV VTI ratio: 1.15  AORTA Ao Root diam: 3.00 cm MITRAL VALVE MV Area (PHT): 5.38 cm    SHUNTS MV Decel Time: 141 msec    Systemic VTI:  0.17 m MV E velocity: 85.10 cm/s  Systemic Diam: 1.90 cm MV A velocity: 79.00 cm/s MV E/A ratio:  1.08 Kirk Ruths MD Electronically signed by Kirk Ruths MD Signature Date/Time: 05/21/2021/12:19:21 PM    Final    CT Renal Stone Study  Result Date: 05/20/2021 CLINICAL DATA:  Flank  pain with kidney stone suspected EXAM: CT ABDOMEN AND PELVIS WITHOUT CONTRAST TECHNIQUE: Multidetector CT imaging of the abdomen and pelvis was performed following the standard protocol without IV contrast. COMPARISON:  05/10/2014 FINDINGS: Lower chest: Coronary atherosclerosis. Atelectasis at the lung bases. Hepatobiliary: Regional steatosis in the right liver. No underlying mass or ductal dilatation seen.No evidence of biliary obstruction or stone. Pancreas: Unremarkable. Spleen: Unremarkable. Adrenals/Urinary Tract: Negative adrenals. No hydronephrosis or stone. Unremarkable bladder. Stomach/Bowel:  No obstruction. No appendicitis. Vascular/Lymphatic: No acute vascular abnormality. IVC filter in expected position. Diffuse atheromatous calcification of the aorta and branch vessels. No mass or adenopathy. Reproductive:Seminal vesicle calcifications. Other: No ascites or pneumoperitoneum.  Fatty umbilical hernia. Musculoskeletal: No acute abnormalities. Prominent lumbar facet spurring with ankylosis at L3-4 and below. Multilevel thoracic spondylosis. IMPRESSION: 1. No acute finding.  No hydronephrosis or ureteral calculus. 2. Right hepatic steatosis. 3. Atherosclerosis including the coronary arteries. Electronically Signed   By: Angelica Chessman  Watts M.D.   On: 05/20/2021 10:02   VAS [place] LOWER EXTREMITY VENOUS (DVT)  Result Date: 05/20/2021  Lower Venous DVT Study Patient Name:  Travis Rollins  Date of Exam:   05/20/2021 Medical Rec #: [id number]           Accession #:    [id number] Date of Birth: Jun 10, 1966            Patient Gender: M Patient Age:   54Y Exam Location:  [facility] Procedure:      VAS [place] LOWER EXTREMITY VENOUS (DVT) Referring Phys: [id number] Orma Flaming --------------------------------------------------------------------------------  Indications: Pulmonary embolism.  Comparison Study: no prior Performing Technologist: Archie Patten RVS  Examination Guidelines: A complete evaluation includes  B-mode imaging, spectral Doppler, color Doppler, and power Doppler as needed of all accessible portions of each vessel. Bilateral testing is considered an integral part of a complete examination. Limited examinations for reoccurring indications may be performed as noted. The reflux portion of the exam is performed with the patient in reverse Trendelenburg.  +---------+---------------+---------+-----------+----------+-----------------+ RIGHT    CompressibilityPhasicitySpontaneityPropertiesThrombus Aging    +---------+---------------+---------+-----------+----------+-----------------+ CFV      Full           Yes      Yes                                    +---------+---------------+---------+-----------+----------+-----------------+ SFJ      Full                                                           +---------+---------------+---------+-----------+----------+-----------------+ FV Prox  Full                                                           +---------+---------------+---------+-----------+----------+-----------------+ FV Mid   Full                                                           +---------+---------------+---------+-----------+----------+-----------------+ FV DistalFull                                                           +---------+---------------+---------+-----------+----------+-----------------+ PFV      Full                                                           +---------+---------------+---------+-----------+----------+-----------------+ POP      Full           Yes      Yes                                    +---------+---------------+---------+-----------+----------+-----------------+  PTV      Full                                                           +---------+---------------+---------+-----------+----------+-----------------+ PERO     Full                                                            +---------+---------------+---------+-----------+----------+-----------------+ Gastroc  None                                         Age Indeterminate +---------+---------------+---------+-----------+----------+-----------------+   +---------+---------------+---------+-----------+----------+-------------------+ LEFT     CompressibilityPhasicitySpontaneityPropertiesThrombus Aging      +---------+---------------+---------+-----------+----------+-------------------+ CFV      Full           Yes      Yes                                      +---------+---------------+---------+-----------+----------+-------------------+ SFJ      Full                                                             +---------+---------------+---------+-----------+----------+-------------------+ FV Prox  Full                                                             +---------+---------------+---------+-----------+----------+-------------------+ FV Mid   Full                                                             +---------+---------------+---------+-----------+----------+-------------------+ FV DistalFull                                                             +---------+---------------+---------+-----------+----------+-------------------+ PFV      Full                                                             +---------+---------------+---------+-----------+----------+-------------------+ POP      Full  Yes      Yes                                      +---------+---------------+---------+-----------+----------+-------------------+ PTV                                                   Not well visualized +---------+---------------+---------+-----------+----------+-------------------+ PERO                                                  Not well visualized +---------+---------------+---------+-----------+----------+-------------------+     Summary: RIGHT: - Findings consistent with age indeterminate deep vein thrombosis involving the right gastrocnemius veins. - No cystic structure found in the popliteal fossa.  LEFT: - There is no evidence of deep vein thrombosis in the lower extremity.  - No cystic structure found in the popliteal fossa.  *See table(s) above for measurements and observations.    Preliminary         Scheduled Meds:  furosemide  40 mg Oral q morning   insulin aspart  0-20 Units Subcutaneous TID WC   insulin glargine  26 Units Subcutaneous QHS   lidocaine  1 patch Transdermal Q24H   lisinopril  2.5 mg Oral Daily   metoprolol succinate  50 mg Oral QHS   polycarbophil  625 mg Oral Daily   potassium chloride  10 mEq Oral QHS   sodium chloride flush  3 mL Intravenous Q12H   tamsulosin  0.4 mg Oral QPC breakfast   topiramate  50 mg Oral BID   Continuous Infusions:  sodium chloride     heparin 1,900 Units/hr (05/21/21 0443)     LOS: 1 day    Time spent: 40 minutes    Irine Seal, MD [facility]   To contact the attending provider between 7A-7P or the covering provider during after hours 7P-7A, please log into the web site [email or web address] and access using universal  password for that web site. If you do not have the password, please call the hospital operator.  05/21/2021, 12:43 PM

## 2021-05-21 NOTE — Consult Note (Signed)
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 BMI is 42.91.  Patient has been  taking apixaban 2.5 mg twice daily.   Patient presented to the hospital 05/20/21 with left lower back pain and shortness of breath x  5 days. He has his baseline chronic lower extremity edema. 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  CBC performed on admission showed a normal WBC and hemoglobin but platelet count was 80,000.  Looking back over his platelet count over the past 7 years shows that his platelet count has been persistently low in the 80-110,000 range.  A CTA of the chest was performed on admission which showed extensive bilateral pulmonary embolism, right ventricular strain. ( RV/LV ratio is 0.86. ) PESI score is 2. CT renal stone study showed no acute finding, right hepatic steatosis.  Doppler ultrasound of the bilateral lower extremities has been performed and preliminary results show age indeterminate DVT involving the right gastrocnemius veins, no DVT on the left. An echocardiogram has been ordered.  He has been started on heparin.  In the Ed patient has significant back pain, he was afebrile without cough, or chest pain . No hemoptysis or bleeding. He has an IVC filter that was placed in 2015. Anticoagulant use/ history includes, warfarin in the past and states that this was changed due to development of clots while on this medication.  He has also taken Xarelto and Pradaxa in the past.  He can recall having melena and hematochezia while  taking Xarelto.  He cannot recall why Pradaxa was stopped but he suspects it was due to recurrent blood clots while taking this medication.  He states that he has been on Eliquis for about 4 to 5 years and has tolerated this  well.  He confirms Eliquis  dose of  2.5 mg twice a day. Prescribed by his PCP. He denies any missed doses. He has never been seen by hematologist in the past for his recurrent VTE, hypercoagulable state. No history of ETOH or tobacco use. No supplements or hormone replacement. No family history of clotting disorders that he is aware of.  PCCM have been asked to assess and evaluate for treatment options in this complicated case.   He appears comfortable on RA with sats of 97%. RR is 18, BP is 132/99. He does get short of breath with minimal exertion, respirations are very shallow.  Past Medical History:  Diagnosis Date   Anemia    Carpal tunnel syndrome    Deep vein thrombosis ([place]) 07/2013, 10/2014, 03/16/2015   LLE (studies at [facility])   Depression    Diabetes mellitus    Hyperlipidemia    Hypertension    Obesity    Osteoarthritis    Peripheral vascular disease ([place])    6 yrs ago, DVT in Lt knee/ groin   Pulmonary embolism (HCC)    hx of x 3 per patient; most recent 02/2014   Sinusitis    Sleep apnea     Family History  Problem Relation Age of Onset   Kidney disease Father  Diabetes Father     Colon cancer Neg Hx     Esophageal cancer Neg Hx     Rectal cancer Neg Hx     Stomach cancer Neg Hx    :     Significant Hospital Events: Including procedures, antibiotic start and stop dates in addition to other pertinent events   Admission 05/20/21, started on Heparin gtt  Interim History / Subjective:  He appears comfortable on RA with sats of 97%. RR is 18, BP is 132/99. He does get short of breath with minimal exertion, respirations are very shallow. Na 139 K 3.9 BUN 23, Creatinine 1.04, Albumin 3.1 WBC 6.2, HGB 13.7, platelets are 74,000 Heparin Level is  0.36 No active bleeding Afebrile   Objective   Blood pressure 118/82, pulse 87, temperature 98 F (36.7 C), temperature source Oral, resp. rate 20, height 5\' 7"  (1.702 m), weight 124.3 kg, SpO2 100 %.        Intake/Output Summary (Last 24 hours) at 05/21/2021 0857 Last data filed at 05/20/2021 1636 Gross per 24 hour  Intake 1000 ml  Output --  Net 1000 ml   Filed Weights   05/19/21 2058  Weight: 124.3 kg    Examination: General: Awake and alert, middle aged male , supine in bed, echo ingress, on RA, in NAD  HENT: NCAT, Thick neck, No LAD, No JVD Lungs: Bilateral chest excursion. Rapid shallow respirations. Clear throughout. No wheeze or rhonchi noted, diminished per bases.  Cardiovascular:  RRR, No RMG, SR per Tele,  Abdomen: Body mass index is 42.91 kg/m.  , BS+, NT, ND Extremities: Bilateral LLE L>R, brisk capillary refill noted Neuro: Awake , alert and oriented, appropriate, excellent historian GU: Not assessed  Labs/imaging that I havepersonally reviewed  (right click and "Reselect all SmartList Selections" daily)  CT Angio Chest 05/20/2021 Extensive bilateral pulmonary embolism. Right ventricular to left ventricular ratio is 0.86, approaching the generally accepted level of 0.9 which would indicate right ventricular strain. Left basilar  infarct Upper lobes are clear except for a few small areas of scarring and emphysematous change. There is atelectasis and or infiltrate in the left lower lobe. Minimal atelectasis at the right base. 6/14 Labs Reviewed Na 139 K 3.9 BUN 23, Creatinine 1.04, Albumin 3.1 WBC 6.2, HGB 13.7, platelets are 74,000 Heparin Level is 0.36 No active bleeding Afebrile  Resolved Hospital Problem list       Assessment & Plan:  Bilateral pulmonary embolism  with RV/LV ratio of 0.89 Currently stable VS, in NAD History of re-current VTE ( PE and DVT) Indeterminate DVT right leg IVC Filter placed 2015 No prior hematology work  up Concern for inability to break down clot and development of CTEPH Plan Continue Heparin gtt per pharmacy Consider Lovenox as treatment once off heparin gtt ( Failure of oral therapy) IR consult for consideration of treatment options ( thrombolysis vs  IVC venogram ) Echo now  CT Venogram abdomen to evaluate clot distribution ( Ordered by Dr. Anselm Pancoast)  Consider Embolectomy vs thrombosis once CT Venogram abdomen have been evaluated Assess for anticardiolipin antibody syndrome ( hematology) Bleeding precautions  Thrombocytopenia Platelets 74 K Plan Trend CBC and platelets Monitor for bleeding Transfuse for platelets of < 40,000 K  Will evaluate CT venogram abdomen and echo to determine best plan of care moving forward.  Appreciate Dr. Moises Blood / IR assistance and Dr. Jonette Eva hematology insight and assistance  Best practice (right click and "Reselect all SmartList Selections" daily)  Diet:  Per  Primary Team Pain/Anxiety/Delirium protocol (if indicated): Per Primary  VAP protocol (if indicated): Not indicated DVT prophylaxis: Systemic AC GI prophylaxis: Per primary team Glucose control:  SSI No Central venous access:  N/A Arterial line:  N/A Foley:  N/A Mobility: Per primary team PT consulted: Per primary team Last date of multidisciplinary goals of care discussion Pending Code Status:  full code Disposition: Progressive Unit  Labs   CBC: Recent Labs  Lab 05/19/21 2059 05/21/21 0222  WBC 6.9 6.2  NEUTROABS  --  4.0  HGB 15.0 13.7  HCT 47.6 42.8  MCV 101.9* 99.5  PLT 80* 74*    Basic Metabolic Panel: Recent Labs  Lab 05/19/21 2059 05/21/21 0222  NA 139 139  K 4.4 3.9  CL 105 106  CO2 25 23  GLUCOSE 143* 122*  BUN 22* 23*  CREATININE 1.13 1.04  CALCIUM 9.3 8.6*   GFR: Estimated Creatinine Clearance: 103.9 mL/min (by C-G formula based on SCr of 1.04 mg/dL). Recent Labs  Lab 05/19/21 2059 05/21/21 0222  WBC 6.9 6.2    Liver Function Tests: Recent Labs   Lab 05/21/21 0222  AST 16  ALT 17  ALKPHOS 58  BILITOT 0.9  PROT 7.3  ALBUMIN 3.1*   No results for input(s): LIPASE, AMYLASE in the last 168 hours. No results for input(s): AMMONIA in the last 168 hours.  ABG    Component Value Date/Time   PHART 7.355 02/25/2014 0047   PCO2ART 40.6 02/25/2014 0047   PO2ART 332.0 (H) 02/25/2014 0047   HCO3 22.8 02/25/2014 0047   TCO2 24 02/25/2014 0047   ACIDBASEDEF 3.0 (H) 02/25/2014 0047   O2SAT 100.0 02/25/2014 0047     Coagulation Profile: Recent Labs  Lab 05/20/21 1300  INR 1.2    Cardiac Enzymes: No results for input(s): CKTOTAL, CKMB, CKMBINDEX, TROPONINI in the last 168 hours.  HbA1C: Hgb A1c MFr Bld  Date/Time Value Ref Range Status  03/12/2016 07:05 AM 5.9 (H) 4.8 - 5.6 % Final    Comment:    (NOTE)         Pre-diabetes: 5.7 - 6.4         Diabetes: >6.4         Glycemic control for adults with diabetes: <7.0   03/22/2012 05:15 AM 13.3 (H) <5.7 % Final    Comment:    (NOTE)                                                                       According to the ADA Clinical Practice Recommendations for 2011, when HbA1c is used as a screening test:  >=6.5%   Diagnostic of Diabetes Mellitus           (if abnormal result is confirmed) 5.7-6.4%   Increased risk of developing Diabetes Mellitus References:Diagnosis and Classification of Diabetes Mellitus,Diabetes JGGE,3662,94(TMLYY 1):S62-S69 and Standards of Medical Care in         Diabetes - 2011,Diabetes TKPT,4656,81 (Suppl 1):S11-S61.    CBG: Recent Labs  Lab 05/20/21 1820 05/20/21 2240 05/21/21 0808  GLUCAP 114* 130* 113*    Review of Systems:   Positive for L sided back pain, dyspnea on exertion.   Past Medical History:  He,  has a past  medical history of Anemia, Carpal tunnel syndrome, Deep vein thrombosis ([place]) (07/2013, 10/2014, 03/16/2015), Depression, Diabetes mellitus, Hyperlipidemia, Hypertension, Obesity, Osteoarthritis, Peripheral vascular disease  ([place]), Pulmonary embolism ([place]), Sinusitis, and Sleep apnea.   Surgical History:   Past Surgical History:  Procedure Laterality Date   CARDIAC CATHETERIZATION N/A 08/10/2015   Procedure: Left Heart Cath and Coronary Angiography;  Surgeon: Adrian Prows, MD;  Location: [facility];  Service: Cardiovascular;  Laterality: N/A;   CYSTOSCOPY WITH RETROGRADE PYELOGRAM, URETEROSCOPY AND STENT PLACEMENT Bilateral 01/03/2015   Procedure: CYSTOSCOPY WITH RETROGRADE PYELOGRAM, RIGHT URETEROSCOPY AND RIGHT STENT PLACEMENT;  Surgeon: Alexis Frock, MD;  Location: [facility];  Service: Urology;  Laterality: Bilateral;   CYSTOSCOPY WITH RETROGRADE PYELOGRAM, URETEROSCOPY AND STENT PLACEMENT Right 03/12/2016   Procedure: CYSTOSCOPY WITH RETROGRADE PYELOGRAM, URETEROSCOPY AND STENT PLACEMENT;  Surgeon: Alexis Frock, MD;  Location: [facility];  Service: Urology;  Laterality: Right;   ENDOVENOUS ABLATION SAPHENOUS VEIN W/ LASER Left 04/20/2018   endovenous laser ablation left greater saphenous vein by Tinnie Gens MD    HOLMIUM LASER APPLICATION Right 3/90/3009   Procedure: HOLMIUM LASER APPLICATION;  Surgeon: Alexis Frock, MD;  Location: [facility];  Service: Urology;  Laterality: Right;   HOLMIUM LASER APPLICATION Right 01/10/3006   Procedure: HOLMIUM LASER APPLICATION;  Surgeon: Alexis Frock, MD;  Location: [facility];  Service: Urology;  Laterality: Right;   IVC filter   07/2014    TONSILLECTOMY     tubes removd from ears       Social History:   reports that he has never smoked. He has never used smokeless tobacco. He reports that he does not drink alcohol and does not use drugs.   Family History:  His family history includes Diabetes in his father; Kidney disease in his father. There is no history of Colon cancer, Esophageal cancer, Rectal cancer, or Stomach cancer.   Allergies Allergies  Allergen Reactions   Penicillins Hives    Has patient had a PCN reaction causing immediate rash, facial/tongue/throat  swelling, SOB or lightheadedness with hypotension: No Has patient had a PCN reaction causing severe rash involving mucus membranes or skin necrosis: No Has patient had a PCN reaction that required hospitalization No Has patient had a PCN reaction occurring within the last 10 years: No If all of the above answers are "NO", then may proceed with Cephalosporin use.     Home Medications  Prior to Admission medications   Medication Sig Start Date End Date Taking? Authorizing Provider  allopurinol (ZYLOPRIM) 100 MG tablet Take 100 mg by mouth daily.   Yes [provider]  apixaban (ELIQUIS) 2.5 MG TABS tablet Take 2.5 mg by mouth 2 (two) times daily.    Yes [provider]  atorvastatin (LIPITOR) 40 MG tablet Take 40 mg by mouth at bedtime.    Yes [provider]  Empagliflozin-metFORMIN HCl 12.04-999 MG TABS Take 1 tablet by mouth daily.   Yes [provider]  furosemide (LASIX) 40 MG tablet Take 40 mg by mouth every morning.    Yes [provider]  liraglutide (VICTOZA) 18 MG/3ML SOPN Inject 18 mg into the skin every morning.   Yes [provider]  lisinopril (PRINIVIL,ZESTRIL) 2.5 MG tablet Take 2.5 mg by mouth daily.   Yes [provider]  metoprolol succinate (TOPROL-XL) 50 MG 24 hr tablet Take 50 mg by mouth at bedtime. Take with or immediately following a meal.   Yes [provider]  polycarbophil (FIBERCON) 625 MG  tablet Take 625 mg by mouth in the morning, at noon, and at bedtime.   Yes [provider]  potassium chloride (K-DUR) 10 MEQ tablet Take 10 mEq by mouth at bedtime.    Yes [provider]  tamsulosin (FLOMAX) 0.4 MG CAPS capsule Take 0.4 mg by mouth daily after breakfast.   Yes [provider]  topiramate (TOPAMAX) 50 MG tablet Take 50 mg by mouth 2 (two) times daily. 06/15/20  Yes [provider]  TRESIBA FLEXTOUCH 200 UNIT/ML SOPN Inject 26 Units into the skin at bedtime.  08/25/18  Yes [provider]     Critical care time: 45 minutes    Magdalen Spatz, MSN, AGACNP-BC [place] for personal pager PCCM on call pager [phone]  05/21/2021 10:30 AM

## 2021-05-21 NOTE — Progress Notes (Addendum)
ANTICOAGULATION CONSULT NOTE - Follow Up Consult  Pharmacy Consult for IV Heparin Indication: pulmonary embolus  Allergies  Allergen Reactions   Penicillins Hives    Has patient had a PCN reaction causing immediate rash, facial/tongue/throat swelling, SOB or lightheadedness with hypotension: No Has patient had a PCN reaction causing severe rash involving mucus membranes or skin necrosis: No Has patient had a PCN reaction that required hospitalization No Has patient had a PCN reaction occurring within the last 10 years: No If all of the above answers are "NO", then may proceed with Cephalosporin use.    Patient Measurements: Height: 5\' 7"  (170.2 cm) Weight: 124.3 kg (274 lb) IBW/kg (Calculated) : 66.1 Heparin Dosing Weight: 95.1 kg  Vital Signs: Temp: 97.6 F (36.4 C) (06/14 1558) Temp Source: Oral (06/14 1558) BP: 110/76 (06/14 1558) Pulse Rate: 86 (06/14 1558)  Labs: Recent Labs    05/19/21 2059 05/20/21 1300 05/20/21 1455 05/20/21 1903 05/21/21 0222 05/21/21 1038 05/21/21 1629  HGB 15.0  --   --   --  13.7  --   --   HCT 47.6  --   --   --  42.8  --   --   PLT 80*  --   --   --  74*  --   --   APTT  --  25  --    < > 50* 67* 53*  LABPROT  --  14.9  --   --   --   --   --   INR  --  1.2  --   --   --   --   --   HEPARINUNFRC  --   --   --   --  0.36  --   --   CREATININE 1.13  --   --   --  1.04  --   --   TROPONINIHS  --  26* 22*  --   --   --   --    < > = values in this interval not displayed.     Estimated Creatinine Clearance: 103.9 mL/min (by C-G formula based on SCr of 1.04 mg/dL).   Medical History: Past Medical History:  Diagnosis Date   Anemia    Carpal tunnel syndrome    Deep vein thrombosis ([place]) 07/2013, 10/2014, 03/16/2015   LLE (studies at [facility])   Depression    Diabetes mellitus    Hyperlipidemia    Hypertension    Obesity    Osteoarthritis    Peripheral vascular disease ([place])    6 yrs ago, DVT in Lt knee/ groin   Pulmonary embolism  (HCC)    hx of x 3 per patient; most recent 02/2014   Sinusitis    Sleep apnea     Assessment: 55 yr old mand presented to ED with increased work of breathing, pain on deep breaths, D-dimer >20 and CT chest positive for extensive bilateral pulmonary embolism with R heart strain. Pharmacy was consulted to dose IV heparin. Pt was on apixaban PTA for hx of PE/DVT (last dose on 6/12 at 0900 AM). Given recent apixaban exposure, will monitor anticoagulation using aPTT until aPTT and heparin levels correlate. Of note, pt had IVC filter placed in 2015 at time of last PE.  Pt had aPTT of 67 sec (low end of goal range) earlier today on heparin infusion at 1900 units/hr. Repeat aPTT this afternoon was 53 sec, which is below the goal range for this pt. H/H 13.7/42.8, plt  74 (chronic low plt, per Epic records since 2015). Per RN, no issues with IV or bleeding observed.  Per IR note, pt being considered for IR thrombolysis procedure.  Goal of Therapy:  Heparin level 0.3-0.7 units/ml Monitor platelets by anticoagulation protocol: Yes   Plan:  Heparin 2400 units IV bolus X 1, followed by increasing heparin infusion to 2200 units/hr Check aPTT, heparin level in 6 hrs and daily while on heparin Monitor CBC Monitor for bleeding F/U plans for possible IR procedure  Gillermina Hu, PharmD, BCPS, [facility] Clinical Pharmacist 05/21/2021 5:59 PM

## 2021-05-21 NOTE — Consult Note (Signed)
Chief Complaint: Patient was seen in consultation today for consideration of PE Thrombolysis Chief Complaint  Patient presents with   Back Pain   Shortness of Breath   at the request of Dr Marin Olp  Supervising Physician: Markus Daft  Patient Status: [name] - In-pt  History of Present Illness: Travis Rollins is a 55 y.o. 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 Right ventricular to left ventricular ratio is 0.86, approaching the generally accepted level of 0.9 which would indicate right ventricular strain.  Dr Marin Olp asking IR to consider PE thrombolysis Dr Anselm Pancoast has discussed with Dr Marin Olp And has also seen and evaluated pt.   Past Medical History:  Diagnosis Date   Anemia    Carpal tunnel syndrome    Deep vein thrombosis ([place]) 07/2013, 10/2014, 03/16/2015   LLE (studies at [facility])   Depression    Diabetes mellitus    Hyperlipidemia    Hypertension    Obesity    Osteoarthritis    Peripheral vascular disease ([place])    6 yrs ago, DVT in Lt knee/ groin   Pulmonary embolism (HCC)    hx of x 3 per patient; most recent 02/2014   Sinusitis    Sleep apnea     Past Surgical History:  Procedure Laterality Date   CARDIAC CATHETERIZATION N/A 08/10/2015   Procedure: Left Heart Cath and Coronary Angiography;  Surgeon: Adrian Prows, MD;  Location: [facility] [facility];  Service: Cardiovascular;  Laterality: N/A;   CYSTOSCOPY WITH RETROGRADE PYELOGRAM,  URETEROSCOPY AND STENT PLACEMENT Bilateral 01/03/2015   Procedure: CYSTOSCOPY WITH RETROGRADE PYELOGRAM, RIGHT URETEROSCOPY AND RIGHT STENT PLACEMENT;  Surgeon: Alexis Frock, MD;  Location: [facility];  Service: Urology;  Laterality: Bilateral;   CYSTOSCOPY WITH RETROGRADE PYELOGRAM, URETEROSCOPY AND STENT PLACEMENT Right 03/12/2016   Procedure: CYSTOSCOPY WITH RETROGRADE PYELOGRAM, URETEROSCOPY AND STENT PLACEMENT;  Surgeon: Alexis Frock, MD;  Location: [facility];  Service: Urology;  Laterality: Right;   ENDOVENOUS ABLATION SAPHENOUS VEIN W/ LASER Left 04/20/2018   endovenous laser ablation left greater saphenous vein by Tinnie Gens MD    HOLMIUM LASER APPLICATION Right 06/09/5008   Procedure: HOLMIUM LASER APPLICATION;  Surgeon: Alexis Frock, MD;  Location: [facility];  Service: Urology;  Laterality: Right;   HOLMIUM LASER APPLICATION Right 02/13/1828   Procedure: HOLMIUM LASER APPLICATION;  Surgeon: Alexis Frock, MD;  Location: [facility];  Service: Urology;  Laterality: Right;   IVC filter   07/2014    TONSILLECTOMY     tubes removd from ears      Allergies: Penicillins  Medications: Prior to Admission medications   Medication Sig Start Date End Date Taking? Authorizing Provider  allopurinol (ZYLOPRIM) 100 MG tablet Take 100 mg by mouth daily.   Yes [provider]  apixaban (ELIQUIS) 2.5 MG TABS tablet Take 2.5 mg by mouth 2 (two) times daily.    Yes [provider]  atorvastatin (LIPITOR) 40 MG tablet Take 40 mg by mouth at bedtime.  Yes [provider]  Empagliflozin-metFORMIN HCl 12.04-999 MG TABS Take 1 tablet by mouth daily.   Yes [provider]  furosemide (LASIX) 40 MG tablet Take 40 mg by mouth every morning.    Yes [provider]  liraglutide (VICTOZA) 18 MG/3ML SOPN Inject 18 mg into the skin every morning.   Yes [provider]  lisinopril (PRINIVIL,ZESTRIL) 2.5 MG tablet Take 2.5 mg by mouth daily.   Yes [provider]   metoprolol succinate (TOPROL-XL) 50 MG 24 hr tablet Take 50 mg by mouth at bedtime. Take with or immediately following a meal.   Yes [provider]  polycarbophil (FIBERCON) 625 MG tablet Take 625 mg by mouth in the morning, at noon, and at bedtime.   Yes [provider]  potassium chloride (K-DUR) 10 MEQ tablet Take 10 mEq by mouth at bedtime.    Yes [provider]  tamsulosin (FLOMAX) 0.4 MG CAPS capsule Take 0.4 mg by mouth daily after breakfast.   Yes [provider]  topiramate (TOPAMAX) 50 MG tablet Take 50 mg by mouth 2 (two) times daily. 06/15/20  Yes [provider]  TRESIBA FLEXTOUCH 200 UNIT/ML SOPN Inject 26 Units into the skin at bedtime. 08/25/18  Yes [provider]     Family History  Problem Relation Age of Onset   Kidney disease Father    Diabetes Father    Colon cancer Neg Hx    Esophageal cancer Neg Hx    Rectal cancer Neg Hx    Stomach cancer Neg Hx     Social History   Socioeconomic History   Marital status: Single    Spouse name: Not on file   Number of children: 0   Years of education: Not on file   Highest education level: Not on file  Occupational History   Not on file  Tobacco Use   Smoking status: Never   Smokeless tobacco: Never  Vaping Use   Vaping Use: Never used  Substance and Sexual Activity   Alcohol use: Never   Drug use: No   Sexual activity: Yes    Birth control/protection: Condom  Other Topics Concern   Not on file  Social History Narrative   Not on file   Social Determinants of Health   Financial Resource Strain: Not on file  Food Insecurity: Not on file  Transportation Needs: Not on file  Physical Activity: Not on file  Stress: Not on file  Social Connections: Not on file     Review of Systems: A 12 point ROS discussed and pertinent positives are indicated in the HPI above.  All other systems are negative.  Review of Systems  Constitutional:  Positive for activity  change. Negative for fatigue and fever.  Respiratory:  Positive for shortness of breath. Negative for wheezing.   Cardiovascular:  Negative for chest pain and leg swelling.  Gastrointestinal:  Negative for abdominal pain.  Musculoskeletal:  Positive for back pain.  Neurological:  Negative for dizziness and weakness.  Psychiatric/Behavioral:  Negative for behavioral problems and confusion.    Vital Signs: BP 118/82 (BP Location: Right Arm)   Pulse 87   Temp 98 F (36.7 C) (Oral)   Resp 20   Ht 5\' 7"  (1.702 m)   Wt 274 lb (124.3 kg)   SpO2 100%   BMI 42.91 kg/m   Physical Exam Vitals reviewed.  Musculoskeletal:     Right lower leg: No tenderness. No edema.  Left lower leg: No tenderness. No edema.     Comments: Moves all 4s NT all extremities Arms soft and no swelling  Skin:    General: Skin is warm.     Comments: Bilateral lower extremity: darkened skin lower legs Scaly skin noted- dryness  Palpation-- seems right low leg is less firm than left  Neurological:     Mental Status: He is alert and oriented to person, place, and time.  Psychiatric:        Behavior: Behavior normal.    Imaging: DG Chest 2 View  Result Date: 05/19/2021 CLINICAL DATA:  Shortness of breath EXAM: CHEST - 2 VIEW COMPARISON:  01/02/2015 FINDINGS: Bibasilar atelectasis. Heart is normal size. No effusions. No acute bony abnormality. IMPRESSION: Bibasilar atelectasis. Electronically Signed   By: Rolm Baptise M.D.   On: 05/19/2021 21:46   CT Angio Chest PE W and/or Wo Contrast  Result Date: 05/20/2021 CLINICAL DATA:  Positive D-dimer. Shortness of breath. Chest pain with deep inspiration. EXAM: CT ANGIOGRAPHY CHEST WITH CONTRAST TECHNIQUE: Multidetector CT imaging of the chest was performed using the standard protocol during bolus administration of intravenous contrast. Multiplanar CT image reconstructions and MIPs were obtained to evaluate the vascular anatomy. CONTRAST:  50mL OMNIPAQUE IOHEXOL 350  MG/ML SOLN COMPARISON:  02/25/2014 FINDINGS: Cardiovascular: Heart size is at the upper limits of normal. There is coronary artery calcification and aortic atherosclerotic calcification. There is extensive bilateral pulmonary embolism. Right ventricular to left ventricular ratio is 0.86, approaching significance. Mediastinum/Nodes: No mass or lymphadenopathy. Lungs/Pleura: Upper lobes are clear except for a few small areas of scarring and emphysematous change. There is atelectasis and or infiltrate in the left lower lobe. Minimal atelectasis at the right base. Upper Abdomen: Negative Musculoskeletal: Ordinary spinal degenerative changes. Review of the MIP images confirms the above findings. IMPRESSION: Extensive bilateral pulmonary embolism. Right ventricular to left ventricular ratio is 0.86, approaching the generally accepted level of 0.9 which would indicate right ventricular strain. Electronically Signed   By: Nelson Chimes M.D.   On: 05/20/2021 12:44   CT Renal Stone Study  Result Date: 05/20/2021 CLINICAL DATA:  Flank pain with kidney stone suspected EXAM: CT ABDOMEN AND PELVIS WITHOUT CONTRAST TECHNIQUE: Multidetector CT imaging of the abdomen and pelvis was performed following the standard protocol without IV contrast. COMPARISON:  05/10/2014 FINDINGS: Lower chest: Coronary atherosclerosis. Atelectasis at the lung bases. Hepatobiliary: Regional steatosis in the right liver. No underlying mass or ductal dilatation seen.No evidence of biliary obstruction or stone. Pancreas: Unremarkable. Spleen: Unremarkable. Adrenals/Urinary Tract: Negative adrenals. No hydronephrosis or stone. Unremarkable bladder. Stomach/Bowel:  No obstruction. No appendicitis. Vascular/Lymphatic: No acute vascular abnormality. IVC filter in expected position. Diffuse atheromatous calcification of the aorta and branch vessels. No mass or adenopathy. Reproductive:Seminal vesicle calcifications. Other: No ascites or pneumoperitoneum.   Fatty umbilical hernia. Musculoskeletal: No acute abnormalities. Prominent lumbar facet spurring with ankylosis at L3-4 and below. Multilevel thoracic spondylosis. IMPRESSION: 1. No acute finding.  No hydronephrosis or ureteral calculus. 2. Right hepatic steatosis. 3. Atherosclerosis including the coronary arteries. Electronically Signed   By: Monte Fantasia M.D.   On: 05/20/2021 10:02   VAS [place] LOWER EXTREMITY VENOUS (DVT)  Result Date: 05/20/2021  Lower Venous DVT Study Patient Name:  BREVEN GUIDROZ  Date of Exam:   05/20/2021 Medical Rec #: [id number]           Accession #:    [id number] Date of Birth: 08/14/1966  Patient Gender: M Patient Age:   59Y Exam Location:  [facility] Procedure:      VAS [place] LOWER EXTREMITY VENOUS (DVT) Referring Phys: [id number] Orma Flaming --------------------------------------------------------------------------------  Indications: Pulmonary embolism.  Comparison Study: no prior Performing Technologist: Archie Patten RVS  Examination Guidelines: A complete evaluation includes B-mode imaging, spectral Doppler, color Doppler, and power Doppler as needed of all accessible portions of each vessel. Bilateral testing is considered an integral part of a complete examination. Limited examinations for reoccurring indications may be performed as noted. The reflux portion of the exam is performed with the patient in reverse Trendelenburg.  +---------+---------------+---------+-----------+----------+-----------------+ RIGHT    CompressibilityPhasicitySpontaneityPropertiesThrombus Aging    +---------+---------------+---------+-----------+----------+-----------------+ CFV      Full           Yes      Yes                                    +---------+---------------+---------+-----------+----------+-----------------+ SFJ      Full                                                            +---------+---------------+---------+-----------+----------+-----------------+ FV Prox  Full                                                           +---------+---------------+---------+-----------+----------+-----------------+ FV Mid   Full                                                           +---------+---------------+---------+-----------+----------+-----------------+ FV DistalFull                                                           +---------+---------------+---------+-----------+----------+-----------------+ PFV      Full                                                           +---------+---------------+---------+-----------+----------+-----------------+ POP      Full           Yes      Yes                                    +---------+---------------+---------+-----------+----------+-----------------+ PTV      Full                                                           +---------+---------------+---------+-----------+----------+-----------------+  PERO     Full                                                           +---------+---------------+---------+-----------+----------+-----------------+ Gastroc  None                                         Age Indeterminate +---------+---------------+---------+-----------+----------+-----------------+   +---------+---------------+---------+-----------+----------+-------------------+ LEFT     CompressibilityPhasicitySpontaneityPropertiesThrombus Aging      +---------+---------------+---------+-----------+----------+-------------------+ CFV      Full           Yes      Yes                                      +---------+---------------+---------+-----------+----------+-------------------+ SFJ      Full                                                             +---------+---------------+---------+-----------+----------+-------------------+ FV Prox  Full                                                              +---------+---------------+---------+-----------+----------+-------------------+ FV Mid   Full                                                             +---------+---------------+---------+-----------+----------+-------------------+ FV DistalFull                                                             +---------+---------------+---------+-----------+----------+-------------------+ PFV      Full                                                             +---------+---------------+---------+-----------+----------+-------------------+ POP      Full           Yes      Yes                                      +---------+---------------+---------+-----------+----------+-------------------+ PTV  Not well visualized +---------+---------------+---------+-----------+----------+-------------------+ PERO                                                  Not well visualized +---------+---------------+---------+-----------+----------+-------------------+    Summary: RIGHT: - Findings consistent with age indeterminate deep vein thrombosis involving the right gastrocnemius veins. - No cystic structure found in the popliteal fossa.  LEFT: - There is no evidence of deep vein thrombosis in the lower extremity.  - No cystic structure found in the popliteal fossa.  *See table(s) above for measurements and observations.    Preliminary     Labs:  CBC: Recent Labs    05/19/21 2059 05/21/21 0222  WBC 6.9 6.2  HGB 15.0 13.7  HCT 47.6 42.8  PLT 80* 74*    COAGS: Recent Labs    05/20/21 1300 05/20/21 1903 05/21/21 0222  INR 1.2  --   --   APTT 25 41* 50*    BMP: Recent Labs    05/19/21 2059 05/21/21 0222  NA 139 139  K 4.4 3.9  CL 105 106  CO2 25 23  GLUCOSE 143* 122*  BUN 22* 23*  CALCIUM 9.3 8.6*  CREATININE 1.13 1.04  GFRNONAA >60 >60    LIVER FUNCTION  TESTS: Recent Labs    05/21/21 0222  BILITOT 0.9  AST 16  ALT 17  ALKPHOS 58  PROT 7.3  ALBUMIN 3.1*    TUMOR MARKERS: No results for input(s): AFPTM, CEA, CA199, CHROMGRNA in the last 8760 hours.  Assessment and Plan:  Bilat PE with some Rt heart strain Dr Anselm Pancoast has seen and examined pt Discussed with pt procedure of PE thrombolysis risks and benefits Including but not limited to Infection; vessel damage; bleeding and bleeding at non targeted areas. We will await Echocardiogram results Will touch base with pt after results in----determine if good candidate for IR thrombolysis Procedure He is aware of procedure-- and is agreeable to move ahead if needed.    Thank you for this interesting consult.  I greatly enjoyed meeting RUDI BUNYARD and look forward to participating in their care.  A copy of this report was sent to the requesting provider on this date.  Electronically Signed: Lavonia Drafts, PA-C 05/21/2021, 9:35 AM   I spent a total of 20 Minutes    in face to face in clinical consultation, greater than 50% of which was counseling/coordinating care for consideration of PE thrombolysis

## 2021-05-22 ENCOUNTER — Inpatient Hospital Stay (HOSPITAL_COMMUNITY): Payer: Medicare (Managed Care)

## 2021-05-22 DIAGNOSIS — I2699 Other pulmonary embolism without acute cor pulmonale: Secondary | ICD-10-CM

## 2021-05-22 DIAGNOSIS — I10 Essential (primary) hypertension: Secondary | ICD-10-CM | POA: Diagnosis not present

## 2021-05-22 DIAGNOSIS — I2510 Atherosclerotic heart disease of native coronary artery without angina pectoris: Secondary | ICD-10-CM | POA: Diagnosis not present

## 2021-05-22 LAB — BASIC METABOLIC PANEL
Anion gap: 10 (ref 5–15)
BUN: 24 mg/dL — ABNORMAL HIGH (ref 6–20)
CO2: 23 mmol/L (ref 22–32)
Calcium: 8.6 mg/dL — ABNORMAL LOW (ref 8.9–10.3)
Chloride: 104 mmol/L (ref 98–111)
Creatinine, Ser: 1.01 mg/dL (ref 0.61–1.24)
GFR, Estimated: >60 mL/min (ref >60)
Glucose, Bld: 120 mg/dL — ABNORMAL HIGH (ref 70–99)
Potassium: 4.2 mmol/L (ref 3.5–5.1)
Sodium: 137 mmol/L (ref 135–145)

## 2021-05-22 LAB — CBC WITH DIFFERENTIAL/PLATELET
Abs Immature Granulocytes: 0.02 10*3/uL (ref 0.00–0.07)
Basophils Absolute: 0 10*3/uL (ref 0.0–0.1)
Basophils Relative: 0 %
Eosinophils Absolute: 0.1 10*3/uL (ref 0.0–0.5)
Eosinophils Relative: 3 %
HCT: 43.6 % (ref 39.0–52.0)
Hemoglobin: 13.9 g/dL (ref 13.0–17.0)
Immature Granulocytes: 0 %
Lymphocytes Relative: 33 %
Lymphs Abs: 1.6 10*3/uL (ref 0.7–4.0)
MCH: 32 pg (ref 26.0–34.0)
MCHC: 31.9 g/dL (ref 30.0–36.0)
MCV: 100.5 fL — ABNORMAL HIGH (ref 80.0–100.0)
Monocytes Absolute: 0.6 10*3/uL (ref 0.1–1.0)
Monocytes Relative: 14 %
Neutro Abs: 2.4 10*3/uL (ref 1.7–7.7)
Neutrophils Relative %: 50 %
Platelets: 79 10*3/uL — ABNORMAL LOW (ref 150–400)
RBC: 4.34 MIL/uL (ref 4.22–5.81)
RDW: 15 % (ref 11.5–15.5)
WBC: 4.8 10*3/uL (ref 4.0–10.5)
nRBC: 0 % (ref 0.0–0.2)

## 2021-05-22 LAB — DRVVT MIX: dRVVT Mix: 55 s — ABNORMAL HIGH (ref 0.0–40.4)

## 2021-05-22 LAB — LUPUS ANTICOAGULANT PANEL
DRVVT: 97.9 s — ABNORMAL HIGH (ref 0.0–47.0)
PTT Lupus Anticoagulant: 57.5 s — ABNORMAL HIGH (ref 0.0–51.9)

## 2021-05-22 LAB — APTT
aPTT: 66 seconds — ABNORMAL HIGH (ref 24–36)
aPTT: 66 seconds — ABNORMAL HIGH (ref 24–36)
aPTT: 77 seconds — ABNORMAL HIGH (ref 24–36)
aPTT: 45 seconds — ABNORMAL HIGH (ref 24–36)

## 2021-05-22 LAB — PROTEIN S ACTIVITY: Protein S Activity: 74 % (ref 63–140)

## 2021-05-22 LAB — GLUCOSE, CAPILLARY
Glucose-Capillary: 110 mg/dL — ABNORMAL HIGH (ref 70–99)
Glucose-Capillary: 112 mg/dL — ABNORMAL HIGH (ref 70–99)
Glucose-Capillary: 99 mg/dL (ref 70–99)
Glucose-Capillary: 126 mg/dL — ABNORMAL HIGH (ref 70–99)
Glucose-Capillary: 181 mg/dL — ABNORMAL HIGH (ref 70–99)

## 2021-05-22 LAB — CARDIOLIPIN ANTIBODIES, IGG, IGM, IGA
Anticardiolipin IgA: <9 APL U/mL (ref 0–11)
Anticardiolipin IgG: <9 GPL U/mL (ref 0–14)
Anticardiolipin IgM: <9 MPL U/mL (ref 0–12)

## 2021-05-22 LAB — HOMOCYSTEINE: Homocysteine: 13.5 umol/L (ref 0.0–14.5)

## 2021-05-22 LAB — BETA-2-GLYCOPROTEIN I ABS, IGG/M/A
Beta-2 Glyco I IgG: <9 GPI IgG units (ref 0–20)
Beta-2-Glycoprotein I IgA: <9 GPI IgA units (ref 0–25)
Beta-2-Glycoprotein I IgM: <9 GPI IgM units (ref 0–32)

## 2021-05-22 LAB — HEMOGLOBIN A1C
Hgb A1c MFr Bld: 7.6 % — ABNORMAL HIGH (ref 4.8–5.6)
Mean Plasma Glucose: 171 mg/dL

## 2021-05-22 LAB — PROTEIN C ACTIVITY: Protein C Activity: 115 % (ref 73–180)

## 2021-05-22 LAB — PROTEIN S, TOTAL: Protein S Ag, Total: 103 % (ref 60–150)

## 2021-05-22 LAB — HEXAGONAL PHASE PHOSPHOLIPID: Hexagonal Phase Phospholipid: 5 s (ref 0–11)

## 2021-05-22 LAB — HEPARIN LEVEL (UNFRACTIONATED)
Heparin Unfractionated: 0.62 IU/mL (ref 0.30–0.70)
Heparin Unfractionated: 0.87 IU/mL — ABNORMAL HIGH (ref 0.30–0.70)
Heparin Unfractionated: 0.39 IU/mL (ref 0.30–0.70)

## 2021-05-22 LAB — PROTEIN C, TOTAL: Protein C, Total: 83 % (ref 60–150)

## 2021-05-22 LAB — PTT-LA MIX: PTT-LA Mix: 58.4 s — ABNORMAL HIGH (ref 0.0–48.9)

## 2021-05-22 LAB — DRVVT CONFIRM: dRVVT Confirm: 1.6 ratio — ABNORMAL HIGH (ref 0.8–1.2)

## 2021-05-22 NOTE — Progress Notes (Signed)
ANTICOAGULATION CONSULT NOTE - Follow Up Consult  Pharmacy Consult for heparin Indication: pulmonary embolus   Labs: Recent Labs    05/19/21 2059 05/20/21 1300 05/20/21 1455 05/20/21 1903 05/21/21 0222 05/21/21 1038 05/21/21 1629 05/22/21 0030  HGB 15.0  --   --   --  13.7  --   --   --   HCT 47.6  --   --   --  42.8  --   --   --   PLT 80*  --   --   --  74*  --   --   --   APTT  --  25  --    < > 50* 67* 53* 66*  LABPROT  --  14.9  --   --   --   --   --   --   INR  --  1.2  --   --   --   --   --   --   HEPARINUNFRC  --   --   --   --  0.36  --   --  0.62  CREATININE 1.13  --   --   --  1.04  --   --   --   TROPONINIHS  --  26* 22*  --   --   --   --   --    < > = values in this interval not displayed.    Assessment: 55yo male therapeutic on heparin after rate change though at low end of goal; no gtt issues or signs of bleeding per RN.  Goal of Therapy:  aPTT 66-102 seconds   Plan:  Will increase heparin gtt slightly to 2300 units/hr and check PTT in 6 hours.    Wynona Neat, PharmD, BCPS  05/22/2021,3:44 AM

## 2021-05-22 NOTE — Progress Notes (Signed)
Patient ID: Travis Rollins, male   DOB: August 27, 1966, 54 y.o.   MRN: [id number] Pt stable; VSS; UE venous duplex pending; no RV strain on recent echo; case reviewed again by Dr. Dwaine Gale; recommend cont IV heparin; no endovascular intervention necessary at this time unless pt becomes unstable; within next 3-5 days recommend f/u CT chest(PE protocol) to reassess clot burden and poss need for any further intervention

## 2021-05-22 NOTE — Progress Notes (Signed)
Upper extremity venous has been completed.   Preliminary results in CV Proc.   Abram Sander 05/22/2021 11:01 AM

## 2021-05-22 NOTE — Progress Notes (Signed)
Travis Rollins is feeling better.  He is on heparin infusion.  Pharmacy is doing a great job with the heparin dosing.  He did have the echocardiogram done yesterday.  There is no right ventricular strain.  There may been some increase in atrial pressure.  The CT venogram did not show much in the way of significant clot associated with the filter.  The IVC was patent.  There is no thrombus within the iliac veins.  Overall, I think he is doing pretty well.  I would have to suspect that there probably is not going to be an indication for thrombolytic therapy.  I would continue him on the heparin for right now.  His labs show white count 4.8.  Hemoglobin 13.9.  Platelet count 79,000.  His BUN is 24 creatinine 1.01.  His vital signs are temperature 98.3.  Pulse 69.  Blood pressure 121/86.  Oxygen saturation is 99%.  His lungs sound clear.  He has good breath sounds bilaterally.  Cardiac exam regular rate and rhythm with no murmurs.  Abdomen is soft.  He is obese.  Extremity shows no clubbing, cyanosis or edema.  At this point, I think we just have to continue him on the heparin.  I just believe heparin is a good anticoagulant right now.  We can adjust his dosing with respect to heparin levels.  I would ultimately make the switch over to Lovenox or Arixtra.  I am not sure what his insurance will cover.  He is going to need injectable anticoagulation in my opinion.  I am just glad that he is doing better.  He has responded very quickly to the heparin infusion.  I do not think that the IVC filter needs to be removed.  There does not seem to be a lot of clot there.  I know he has gotten fantastic care from all the staff on [address].  Lattie Haw, MD  Psalms 41:1

## 2021-05-22 NOTE — Progress Notes (Addendum)
ANTICOAGULATION CONSULT NOTE - Follow Up Consult  Pharmacy Consult for IV Heparin Indication: pulmonary embolus  Allergies  Allergen Reactions   Penicillins Hives    Has patient had a PCN reaction causing immediate rash, facial/tongue/throat swelling, SOB or lightheadedness with hypotension: No Has patient had a PCN reaction causing severe rash involving mucus membranes or skin necrosis: No Has patient had a PCN reaction that required hospitalization No Has patient had a PCN reaction occurring within the last 10 years: No If all of the above answers are "NO", then may proceed with Cephalosporin use.    Patient Measurements: Height: 5\' 7"  (170.2 cm) Weight: 124.3 kg (274 lb) IBW/kg (Calculated) : 66.1 Heparin Dosing Weight: 95.1 kg  Vital Signs: Temp: 98 F (36.7 C) (06/15 0758) Temp Source: Oral (06/15 0758) BP: 125/85 (06/15 0758) Pulse Rate: 79 (06/15 0758)  Labs: Recent Labs    05/19/21 2059 05/20/21 1300 05/20/21 1455 05/20/21 1903 05/21/21 0222 05/21/21 1038 05/21/21 1629 05/22/21 0030 05/22/21 0306  HGB 15.0  --   --   --  13.7  --   --   --  13.9  HCT 47.6  --   --   --  42.8  --   --   --  43.6  PLT 80*  --   --   --  74*  --   --   --  79*  APTT  --  25  --    < > 50*   < > 53* 66* 66*  LABPROT  --  14.9  --   --   --   --   --   --   --   INR  --  1.2  --   --   --   --   --   --   --   HEPARINUNFRC  --   --   --   --  0.36  --   --  0.62  --   CREATININE 1.13  --   --   --  1.04  --   --   --  1.01  TROPONINIHS  --  26* 22*  --   --   --   --   --   --    < > = values in this interval not displayed.     Estimated Creatinine Clearance: 107 mL/min (by C-G formula based on SCr of 1.01 mg/dL).   Medical History: Past Medical History:  Diagnosis Date   Anemia    Carpal tunnel syndrome    Deep vein thrombosis ([place]) 07/2013, 10/2014, 03/16/2015   LLE (studies at [facility])   Depression    Diabetes mellitus    Hyperlipidemia    Hypertension    Obesity     Osteoarthritis    Peripheral vascular disease ([place])    6 yrs ago, DVT in Lt knee/ groin   Pulmonary embolism (HCC)    hx of x 3 per patient; most recent 02/2014   Sinusitis    Sleep apnea     Assessment: 55 yr old mand presented to ED with increased work of breathing, pain on deep breaths, D-dimer >20 and CT chest positive for extensive bilateral pulmonary embolism with R heart strain. Pharmacy was consulted to dose IV heparin. Pt was on apixaban PTA for hx of PE/DVT (last dose on 6/12 at 0900 AM). Given recent apixaban exposure, aPTT monitoring initated until aPTT and heparin levels are correlated. Of note, pt had IVC filter placed  in 2015 at time of last PE.  Heparin Levels (Goal: HL 0.3-0.7; aPTT 66-102 seconds) Overnight: --Baseline aPTT 25 sec --6/14 1629: aPTT 53 sec - bolus 2400 units; increase to 2200 unit/hr --6/15 0030: aPTT 66 (daily aPTT 66 0306) - increased to 2300 units/hr --6/15 0938: aPTT/HL 77 sec / 0.87 IU/mL   Per RN, no issues with IV or bleeding observed.  IR and Onc are no longer considering thrombolytic therapy per most recent notes. Onc recommending transition to Arixtra or lovenox post-heparin.  Goal of Therapy:  Heparin level 0.3-0.7 units/ml Monitor platelets by anticoagulation protocol: Yes   Plan:  Continue heparin drip at 2300 unit/hr Check aPTT/heparin level in 6 hrs and daily while on heparin - Transition to Heparin levels when correlated Monitor CBC Monitor for bleeding  Lorelei Pont, PharmD, BCPS 05/22/2021 8:43 AM Clinical Pharmacist -  [phone]

## 2021-05-22 NOTE — Progress Notes (Addendum)
ANTICOAGULATION CONSULT NOTE - Follow Up Consult  Pharmacy Consult for IV Heparin Indication: pulmonary embolus  Allergies  Allergen Reactions   Penicillins Hives    Has patient had a PCN reaction causing immediate rash, facial/tongue/throat swelling, SOB or lightheadedness with hypotension: No Has patient had a PCN reaction causing severe rash involving mucus membranes or skin necrosis: No Has patient had a PCN reaction that required hospitalization No Has patient had a PCN reaction occurring within the last 10 years: No If all of the above answers are "NO", then may proceed with Cephalosporin use.    Patient Measurements: Height: 5\' 7"  (170.2 cm) Weight: 124.3 kg (274 lb) IBW/kg (Calculated) : 66.1 Heparin Dosing Weight: 95.1 kg  Vital Signs: Temp: 98.4 F (36.9 C) (06/15 1216) Temp Source: Oral (06/15 1216) BP: 144/89 (06/15 1216) Pulse Rate: 70 (06/15 1216)  Labs: Recent Labs    05/19/21 2059 05/20/21 1300 05/20/21 1455 05/20/21 1903 05/21/21 0222 05/21/21 1038 05/22/21 0030 05/22/21 0306 05/22/21 0938 05/22/21 1757  HGB 15.0  --   --   --  13.7  --   --  13.9  --   --   HCT 47.6  --   --   --  42.8  --   --  43.6  --   --   PLT 80*  --   --   --  74*  --   --  79*  --   --   APTT  --  25  --    < > 50*   < > 66* 66* 77* 45*  LABPROT  --  14.9  --   --   --   --   --   --   --   --   INR  --  1.2  --   --   --   --   --   --   --   --   HEPARINUNFRC  --   --   --    < > 0.36  --  0.62  --  0.87* 0.39  CREATININE 1.13  --   --   --  1.04  --   --  1.01  --   --   TROPONINIHS  --  26* 22*  --   --   --   --   --   --   --    < > = values in this interval not displayed.    Estimated Creatinine Clearance: 107 mL/min (by C-G formula based on SCr of 1.01 mg/dL).   Medical History: Past Medical History:  Diagnosis Date   Anemia    Carpal tunnel syndrome    Deep vein thrombosis ([place]) 07/2013, 10/2014, 03/16/2015   LLE (studies at [facility])   Depression     Diabetes mellitus    Hyperlipidemia    Hypertension    Obesity    Osteoarthritis    Peripheral vascular disease ([place])    6 yrs ago, DVT in Lt knee/ groin   Pulmonary embolism ([facility])    hx of x 3 per patient; most recent 02/2014   Sinusitis    Sleep apnea     Assessment: 55 yr old man presented to ED with increased work of breathing, pain on deep breaths, D-dimer >20 and CT chest positive for extensive bilateral pulmonary embolism with R heart strain. Pharmacy was consulted to dose IV heparin. Pt was on apixaban PTA for hx of PE/DVT (last dose on 6/12 at  0900 AM). Given recent apixaban exposure, aPTT monitoring initated until aPTT and heparin levels are correlated. Of note, pt had IVC filter placed in 2015 at time of last PE.  aPTT and heparin level decreased - aPTT 39/HL 45 on current rate of 2300 units/hr which is below therapeutic goal  Per RN, no issues with IV (not held or stopped). No extravasation of site. Minor oozing at infusion site but RN not concerned. Platelets low 79 - stable from 6/12.   IR and Onc are no longer considering thrombolytic therapy per most recent notes. Onc recommending transition to Arixtra or lovenox post-heparin.  Goal of Therapy:  Heparin level 0.3-0.7 units/ml aPTT 66-102 seconds Monitor platelets by anticoagulation protocol: Yes   Plan:  Increase heparin drip at 2500 unit/hr Check aPTT/heparin level in 6 hrs and daily while on heparin - Transition to Heparin levels when correlated Monitor CBC Monitor for bleeding  Sloan Leiter, PharmD, BCPS, BCCCP Clinical Pharmacist Please refer to [facility] for [place] numbers 05/22/2021 7:27 PM  Addendum: More oozing from IV infusion site per RN. Changed IV Heparin to opposite arm. Proceeding with rate increase and RN to call RX if bleeding from new site.   Sloan Leiter, PharmD, BCPS, BCCCP Clinical Pharmacist Please refer to [facility] for [place] numbers 05/22/2021, 9:32 PM

## 2021-05-22 NOTE — Progress Notes (Signed)
PROGRESS NOTE    Travis Rollins  QMG:[id number] DOB: 17-May-1966 DOA: 05/19/2021 PCP: Boyce Medici, FNP    Brief Narrative:  55 year old gentleman history of recurrent PEs was on Coumadin, Xarelto, Pradaxa, and currently on apixaban presented to the ED with left lower back pain and shortness of breath which has been worsening x5 days.  Patient status post recent COVID-19 booster 05/10/2021.  CT angiogram chest performed with extensive bilateral PE with right ventricular strain.  CT renal stone protocol with hepatic steatosis otherwise unremarkable.  Lower extremity Dopplers done with age  indeterminate DVT of right gastrocnemius vein.  2D echo ordered and pending.  Hematology consulted patient seen in consultation by Dr. Marin Olp who is recommending evaluation by IR or vascular surgery for evaluation for thrombolytic therapy.  Also concern for IVC filter being clogged with clots.  Anticoagulation panel pending.  PCCM and IR consulted for further evaluation and management.  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 -CT angiogram done with extensive bilateral PE with right ventricular strain. -2D echo pending. -Lower extremity Dopplers with age-indeterminate right gastrocnemius vein DVT. -Hypercoagulable in progress -PCCM, Hematology, IR following. Recommendation for  continued heparin gtt -Per hematology, possible transition to Arixtra or lovenox ultimately -Recheck cbc in AM  2.  Chronic thrombocytopenia -Patient noted never have to seen a hematologist in the past. -Patient with no overt bleeding. -Currently on heparin drip per above -Statin and allopurinol on hold. -recheck cbc in AM  3.  Diabetes mellitus type 2 -Hemoglobin A1c 5.9 (03/12/2016) -Repeat hemoglobin A1c of 7.6 -Continue CBG before meals and at bedtime -Decrease Lantus dose to 13 units daily as patient to be n.p.o. after midnight.   -SSI.  4.  Coronary artery disease -Stable -2D echo pending -Continue beta-blocker.  5.  Hypertension -BP remains stable -Monitor blood pressure secondary to problem #1 -Continue lisinopril, Toprol-XL.  6.  Hyperlipidemia -Statin on hold secondary to thrombocytopenia  7.  Venous insufficiency -Stable  8.  Weight management -Continue Topamax.    DVT prophylaxis: Heparin gtt Code Status: Full Family Communication: Pt in room, family not at bedside  Status is: Inpatient  Remains inpatient appropriate because:Inpatient level of care appropriate due to severity of illness  Dispo: The patient is from: Home              Anticipated d/c is to: Home              Patient currently is not medically stable to d/c.   Difficult to place patient No       Consultants:  PCCM Hematology IR  Procedures:    Antimicrobials: Anti-infectives (From admission, onward)    None       Subjective: Reports feeing somewhat better and breathing better today  Objective: Vitals:   05/22/21 0039 05/22/21 0316 05/22/21 0758 05/22/21 1216  BP: 116/79 121/86 125/85 (!) 144/89  Pulse: 79 69 79 70  Resp: 18  19 20 20   Temp: 97.6 F (36.4 C) 98.3 F (36.8 C) 98 F (36.7 C) 98.4 F (36.9 C)  TempSrc: Oral Oral Oral Oral  SpO2: 99%  100% 100%  Weight:      Height:        Intake/Output Summary (Last 24 hours) at 05/22/2021 1647 Last data  filed at 05/22/2021 1217 Gross per 24 hour  Intake 717.45 ml  Output 1580 ml  Net -862.55 ml   Filed Weights   05/19/21 2058  Weight: 124.3 kg    Examination: General exam: Awake, laying in bed, in nad Respiratory system: Normal respiratory effort, no wheezing Cardiovascular system: regular rate, s1, s2 Gastrointestinal system: Soft, nondistended, positive BS Central nervous system: CN2-12 grossly intact, strength intact Extremities: Perfused, no clubbing Skin: Normal skin turgor, no notable skin lesions seen Psychiatry: Mood normal // no visual hallucinations   Data Reviewed: I have personally reviewed following labs and imaging studies  CBC: Recent Labs  Lab 05/19/21 2059 05/21/21 0222 05/22/21 0306  WBC 6.9 6.2 4.8  NEUTROABS  --  4.0 2.4  HGB 15.0 13.7 13.9  HCT 47.6 42.8 43.6  MCV 101.9* 99.5 100.5*  PLT 80* 74* 79*   Basic Metabolic Panel: Recent Labs  Lab 05/19/21 2059 05/21/21 0222 05/22/21 0306  NA 139 139 137  K 4.4 3.9 4.2  CL 105 106 104  CO2 25 23 23   GLUCOSE 143* 122* 120*  BUN 22* 23* 24*  CREATININE 1.13 1.04 1.01  CALCIUM 9.3 8.6* 8.6*   GFR: Estimated Creatinine Clearance: 107 mL/min (by C-G formula based on SCr of 1.01 mg/dL). Liver Function Tests: Recent Labs  Lab 05/21/21 0222  AST 16  ALT 17  ALKPHOS 58  BILITOT 0.9  PROT 7.3  ALBUMIN 3.1*   No results for input(s): LIPASE, AMYLASE in the last 168 hours. No results for input(s): AMMONIA in the last 168 hours. Coagulation Profile: Recent Labs  Lab 05/20/21 1300  INR 1.2   Cardiac Enzymes: No results for input(s): CKTOTAL, CKMB, CKMBINDEX, TROPONINI in the last 168 hours. BNP (last 3 results) No results for input(s): PROBNP in the last 8760 hours. HbA1C: Recent Labs    05/21/21 0222  HGBA1C 7.6*   CBG: Recent Labs  Lab 05/21/21 2138 05/22/21 0351 05/22/21 0804 05/22/21 1243 05/22/21 1624  GLUCAP 167* 110* 112* 99 126*   Lipid Profile: No results for  input(s): CHOL, HDL, LDLCALC, TRIG, CHOLHDL, LDLDIRECT in the last 72 hours. Thyroid Function Tests: No results for input(s): TSH, T4TOTAL, FREET4, T3FREE, THYROIDAB in the last 72 hours. Anemia Panel: No results for input(s): VITAMINB12, FOLATE, FERRITIN, TIBC, IRON, RETICCTPCT in the last 72 hours. Sepsis Labs: No results for input(s): PROCALCITON, LATICACIDVEN in the last 168 hours.  Recent Results (from the past 240 hour(s))  Resp Panel by RT-PCR (Flu A&B, Covid) Nasopharyngeal Swab     Status: None   Collection Time: 05/20/21  1:08 PM   Specimen: Nasopharyngeal Swab; Nasopharyngeal(NP) swabs in vial transport medium  Result Value Ref Range Status   SARS Coronavirus 2 by RT PCR NEGATIVE NEGATIVE Final    Comment: (NOTE) SARS-CoV-2 target nucleic acids are NOT DETECTED.  The SARS-CoV-2 RNA is generally detectable in upper respiratory specimens during the acute phase of infection. The lowest concentration of SARS-CoV-2 viral copies this assay can detect is 138 copies/mL. A negative result does not preclude SARS-Cov-2 infection and should not be used as the sole basis for treatment or other patient management decisions.  A negative result may occur with  improper specimen collection/handling, submission of specimen other than nasopharyngeal swab, presence of viral mutation(s) within the areas targeted by this assay, and inadequate number of viral copies(<138 copies/mL). A negative result must be combined with clinical observations, patient history, and epidemiological information. The expected result is Negative.  Fact Sheet for Patients:  EntrepreneurPulse.com.au  Fact Sheet for Healthcare Providers:  IncredibleEmployment.be  This test is no t yet approved or cleared by the [place] FDA and  has been authorized for detection and/or diagnosis of SARS-CoV-2 by FDA under an Emergency Use Authorization (EUA). This EUA will remain  in effect  (meaning this test can be used) for the duration of the COVID-19 declaration under Section 564(b)(1) of the Act, 21 U.S.C.section 360bbb-3(b)(1), unless the authorization is terminated  or revoked sooner.       Influenza A by PCR NEGATIVE NEGATIVE Final   Influenza B by PCR NEGATIVE NEGATIVE Final    Comment: (NOTE) The Xpert Xpress SARS-CoV-2/FLU/RSV plus assay is intended as an aid in the diagnosis of influenza from Nasopharyngeal swab specimens and should not be used as a sole basis for treatment. Nasal washings and aspirates are unacceptable for Xpert Xpress SARS-CoV-2/FLU/RSV testing.  Fact Sheet for Patients: EntrepreneurPulse.com.au  Fact Sheet for Healthcare Providers: IncredibleEmployment.be  This test is not yet approved or cleared by the [place] FDA and has been authorized for detection and/or diagnosis of SARS-CoV-2 by FDA under an Emergency Use Authorization (EUA). This EUA will remain in effect (meaning this test can be used) for the duration of the COVID-19 declaration under Section 564(b)(1) of the Act, 21 U.S.C. section 360bbb-3(b)(1), unless the authorization is terminated or revoked.  Performed at [facility], [place] [address] [place], [place] [address]      Radiology Studies: ECHOCARDIOGRAM COMPLETE  Result Date: 05/21/2021    ECHOCARDIOGRAM REPORT   Patient Name:   BOSTEN NEWSTROM Date of Exam: 05/21/2021 Medical Rec #:  [id number]          Height:       67.0 in Accession #:    [id number]         Weight:       274.0 lb Date of Birth:  12-04-1966           BSA:          2.312 m Patient Age:    8 years           BP:           118/82 mmHg Patient Gender: M                  HR:           90 bpm. Exam Location:  Inpatient Procedure: 2D Echo, Cardiac Doppler, Color Doppler and Intracardiac            Opacification Agent Indications:    Pulmonary embolism  History:        Patient has prior history of Echocardiogram  examinations, most                 recent 09/11/2020. PAD; Risk Factors:Diabetes, Hypertension and                 Dyslipidemia.  Sonographer:    Maudry Mayhew MHA, RDMS, RVT, RDCS Referring Phys: [id number] [facility]  Sonographer Comments: Technically difficult study due to poor echo windows and patient is morbidly obese. Image acquisition challenging due to  patient body habitus and Image acquisition challenging due to respiratory motion. IMPRESSIONS  1. Left ventricular ejection fraction, by estimation, is 60 to 65%. The left ventricle has normal function. The left ventricle has no regional wall motion abnormalities. Left ventricular diastolic parameters were normal.  2. Right ventricular systolic function is normal. The right ventricular size is normal.  3. The mitral valve is normal in structure. No evidence of mitral valve regurgitation. No evidence of mitral stenosis.  4. The aortic valve has an indeterminant number of cusps. Aortic valve regurgitation is not visualized. No aortic stenosis is present.  5. The inferior vena cava is dilated in size with >50% respiratory variability, suggesting right atrial pressure of 8 mmHg. FINDINGS  Left Ventricle: Left ventricular ejection fraction, by estimation, is 60 to 65%. The left ventricle has normal function. The left ventricle has no regional wall motion abnormalities. Definity contrast agent was given IV to delineate the left ventricular  endocardial borders. The left ventricular internal cavity size was normal in size. There is no left ventricular hypertrophy. Left ventricular diastolic parameters were normal. Right Ventricle: The right ventricular size is normal. Right ventricular systolic function is normal. Left Atrium: Left atrial size was normal in size. Right Atrium: Right atrial size was normal in size. Pericardium: There is no evidence of pericardial effusion. Mitral Valve: The mitral valve is normal in structure. No evidence of mitral valve  regurgitation. No evidence of mitral valve stenosis. Tricuspid Valve: The tricuspid valve is normal in structure. Tricuspid valve regurgitation is trivial. No evidence of tricuspid stenosis. Aortic Valve: The aortic valve has an indeterminant number of cusps. Aortic valve regurgitation is not visualized. No aortic stenosis is present. Aortic valve mean gradient measures 1.0 mmHg. Aortic valve peak gradient measures 2.6 mmHg. Aortic valve area, by VTI measures 3.26 cm. Pulmonic Valve: The pulmonic valve was not well visualized. Pulmonic valve regurgitation is not visualized. No evidence of pulmonic stenosis. Aorta: The aortic root is normal in size and structure. Venous: The inferior vena cava is dilated in size with greater than 50% respiratory variability, suggesting right atrial pressure of 8 mmHg.  LEFT VENTRICLE PLAX 2D LVIDd:         4.50 cm  Diastology LVIDs:         3.50 cm  LV e' medial:    9.57 cm/s LV PW:         0.70 cm  LV E/e' medial:  8.9 LV IVS:        0.60 cm  LV e' lateral:   9.68 cm/s LVOT diam:     1.90 cm  LV E/e' lateral: 8.8 LV SV:         48 LV SV Index:   21 LVOT Area:     2.84 cm  RIGHT VENTRICLE RV S prime:     10.00 cm/s TAPSE (M-mode): 2.3 cm LEFT ATRIUM             Index       RIGHT ATRIUM           Index LA diam:        3.40 cm 1.47 cm/m  RA Area:     12.70 cm LA Vol (A2C):   27.7 ml 11.98 ml/m RA Volume:   31.30 ml  13.54 ml/m LA Vol (A4C):   47.3 ml 20.46 ml/m LA Biplane Vol: 36.6 ml 15.83 ml/m  AORTIC VALVE AV Area (Vmax):    3.22 cm AV Area (Vmean):   2.82  cm AV Area (VTI):     3.26 cm AV Vmax:           81.00 cm/s AV Vmean:          56.500 cm/s AV VTI:            0.147 m AV Peak Grad:      2.6 mmHg AV Mean Grad:      1.0 mmHg LVOT Vmax:         92.00 cm/s LVOT Vmean:        56.200 cm/s LVOT VTI:          0.169 m LVOT/AV VTI ratio: 1.15  AORTA Ao Root diam: 3.00 cm MITRAL VALVE MV Area (PHT): 5.38 cm    SHUNTS MV Decel Time: 141 msec    Systemic VTI:  0.17 m MV E  velocity: 85.10 cm/s  Systemic Diam: 1.90 cm MV A velocity: 79.00 cm/s MV E/A ratio:  1.08 Kirk Ruths MD Electronically signed by Kirk Ruths MD Signature Date/Time: 05/21/2021/12:19:21 PM    Final    CT VENOGRAM ABD/PEL  Result Date: 05/21/2021 CLINICAL DATA:  55 year old male with history of thromboembolic disease and previous IVC filter placed in 2015. Patient presents with bilateral pulmonary embolism. Patient is being evaluated for possible intervention for the pulmonary emboli. Need to evaluate the IVC and IVC filter for thrombus. EXAM: CT ABDOMEN AND PELVIS WITH CONTRAST (CT VENOGRAM PROTOCOL) TECHNIQUE: Multidetector CT imaging of the abdomen and pelvis was performed using the venogram protocol following bolus administration of intravenous contrast. CONTRAST:  133mL OMNIPAQUE IOHEXOL 350 MG/ML SOLN COMPARISON:  CT renal stone protocol 05/20/2021 and CTA chest 05/20/2021 FINDINGS: Lower chest: Small amount of compressive atelectasis in the right lower lobe. Slightly increased consolidation in the posterior left lower lobe compared to the recent chest CTA. Again noted are pulmonary emboli in the lower lobe pulmonary arteries. Difficult to exclude trace left pleural fluid. Hepatobiliary: Normal appearance of the liver and gallbladder. No biliary dilatation. Main portal venous system is patent. Pancreas: Unremarkable. No pancreatic ductal dilatation or surrounding inflammatory changes. Spleen: Normal in size without focal abnormality. Adrenals/Urinary Tract: Normal adrenal glands. Normal appearance of both kidneys without hydronephrosis. Normal appearance of the urinary bladder. Stomach/Bowel: Normal appearance of the stomach and small bowel. Normal appearance of the colon and appendix. No acute inflammatory changes. No evidence for bowel obstruction. Vascular/Lymphatic: Atherosclerotic calcifications without aortic aneurysm. Main visceral arteries are patent. IVC filter is positioned below the renal  veins. Minimal leg penetration involving a leg in the anterior IVC. IVC appears to be patent. There appears to be a small amount of thrombus associated with the top of the IVC filter near the hook. Overall, small amount of clot associated with the filter. No definite the thrombus involving the iliac veins. Renal veins are patent. Splenic vein is patent. No significant lymph node enlargement in the abdomen or pelvis. Reproductive: Prostate is unremarkable. Other: Negative for ascites. Negative for free air. Large periumbilical hernia containing fat. There is a large superficial calcification associated with this ventral hernia. Musculoskeletal: Multilevel degenerative facet disease. IMPRESSION: 1. IVC filter is well positioned. There is probably a small amount of clot associated with the top of the filter but the IVC is patent. No evidence for thrombus within the iliac veins. 2. Known pulmonary embolism involving lower lobe pulmonary arteries. Slightly increased consolidation at the left lung base probably represents a combination of pulmonary infarct and atelectasis. Cannot exclude trace left pleural fluid. 3. Ventral  hernia containing fat. Electronically Signed   By: Markus Daft M.D.   On: 05/21/2021 17:33   VAS [place] UPPER EXTREMITY VENOUS DUPLEX  Result Date: 05/22/2021 UPPER VENOUS STUDY  Patient Name:  OMEGA SLAGER  Date of Exam:   05/22/2021 Medical Rec #: [id number]           Accession #:    [id number] Date of Birth: [birth year]            Patient Gender: M Patient Age:   76Y Exam Location:  [facility] Procedure:      VAS [place] UPPER EXTREMITY VENOUS DUPLEX Referring Phys: [id number] ADAM HENN --------------------------------------------------------------------------------  Indications: pulmonary embolism Comparison Study: no prior Performing Technologist: Archie Patten RVS  Examination Guidelines: A complete evaluation includes B-mode imaging, spectral Doppler, color Doppler, and power Doppler as needed  of all accessible portions of each vessel. Bilateral testing is considered an integral part of a complete examination. Limited examinations for reoccurring indications may be performed as noted.  Right Findings: +----------+------------+---------+-----------+----------+-------+ RIGHT     CompressiblePhasicitySpontaneousPropertiesSummary +----------+------------+---------+-----------+----------+-------+ IJV           Full       Yes       Yes                      +----------+------------+---------+-----------+----------+-------+ Subclavian    Full       Yes       Yes                      +----------+------------+---------+-----------+----------+-------+ Axillary      Full       Yes       Yes                      +----------+------------+---------+-----------+----------+-------+ Brachial      Full       Yes       Yes                      +----------+------------+---------+-----------+----------+-------+ Radial        Full                                          +----------+------------+---------+-----------+----------+-------+ Ulnar         Full                                          +----------+------------+---------+-----------+----------+-------+ Cephalic      Full                                          +----------+------------+---------+-----------+----------+-------+ Basilic       Full                                          +----------+------------+---------+-----------+----------+-------+  Left Findings: +----------+------------+---------+-----------+----------+-------+ LEFT      CompressiblePhasicitySpontaneousPropertiesSummary +----------+------------+---------+-----------+----------+-------+ IJV           Full       Yes       Yes                      +----------+------------+---------+-----------+----------+-------+  Subclavian    Full       Yes       Yes                       +----------+------------+---------+-----------+----------+-------+ Axillary      Full       Yes       Yes                      +----------+------------+---------+-----------+----------+-------+ Brachial      Full       Yes       Yes                      +----------+------------+---------+-----------+----------+-------+ Radial        Full                                          +----------+------------+---------+-----------+----------+-------+ Ulnar         Full                                          +----------+------------+---------+-----------+----------+-------+ Cephalic      Full                                          +----------+------------+---------+-----------+----------+-------+ Basilic       Full                                          +----------+------------+---------+-----------+----------+-------+  Summary: No evidence of deep vein or superficial vein thrombosis involving the right and left upper extremities.  *See table(s) above for measurements and observations.    Preliminary     Scheduled Meds:  furosemide  40 mg Oral q morning   insulin aspart  0-20 Units Subcutaneous TID WC   insulin glargine  13 Units Subcutaneous QHS   lidocaine  1 patch Transdermal Q24H   lisinopril  2.5 mg Oral Daily   metoprolol succinate  50 mg Oral QHS   polycarbophil  625 mg Oral Daily   potassium chloride  10 mEq Oral QHS   sodium chloride flush  3 mL Intravenous Q12H   tamsulosin  0.4 mg Oral QPC breakfast   topiramate  50 mg Oral BID   Continuous Infusions:  sodium chloride     heparin 2,300 Units/hr (05/22/21 1157)     LOS: 2 days   Marylu Lund, MD [facility] Pager On Amion  If 7PM-7AM, please contact night-coverage 05/22/2021, 4:47 PM

## 2021-05-22 NOTE — Progress Notes (Signed)
Heparin rate increased to 23 ml/hr

## 2021-05-22 NOTE — Progress Notes (Signed)
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 BMI is 42.91.  Patient has been  taking apixaban 2.5 mg twice daily.   Patient presented to the hospital 05/20/21 with left lower back pain and shortness of breath x  5 days. He has his baseline chronic lower extremity edema. 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  CBC performed on admission showed a normal WBC and hemoglobin but platelet count was 80,000.  Looking back over his platelet count over the past 7 years shows that his platelet count has been persistently low in the 80-110,000 range.  A CTA of the chest was performed on admission which showed extensive bilateral pulmonary embolism, right ventricular strain. ( RV/LV ratio is 0.86. ) PESI score is 2. CT renal stone study showed no acute finding, right hepatic steatosis.  Doppler ultrasound of the bilateral lower extremities has been performed and preliminary results show age indeterminate DVT involving the right gastrocnemius veins, no DVT on the left. An echocardiogram has been ordered.  He has been started on heparin.  In the Ed patient has significant back pain, he was afebrile without cough, or chest pain . No hemoptysis or bleeding. He has an IVC filter that was placed in 2015. Anticoagulant use/ history includes, warfarin in the past and states that this was changed due to development of clots while on this medication.  He has also taken Xarelto and Pradaxa in the past.  He can recall having melena and hematochezia while  taking Xarelto.  He cannot recall why Pradaxa was stopped but he suspects it was due to recurrent blood clots while taking this medication.  He states that he has been on Eliquis for about 4 to 5 years and has tolerated this  well.  He confirms Eliquis  dose of  2.5 mg twice a day. Prescribed by his PCP. He denies any missed doses. He has never been seen by hematologist in the past for his recurrent VTE, hypercoagulable state. No history of ETOH or tobacco use. No supplements or hormone replacement. No family history of clotting disorders that he is aware of.  PCCM have been asked to assess and evaluate for treatment options in this complicated case.   He appears comfortable on RA with sats of 97%. RR is 18, BP is 132/99. He does get short of breath with minimal exertion, respirations are very shallow.  Past Medical History:  Diagnosis Date   Anemia    Carpal tunnel syndrome    Deep vein thrombosis ([place]) 07/2013, 10/2014, 03/16/2015   LLE (studies at [facility])   Depression    Diabetes mellitus    Hyperlipidemia    Hypertension    Obesity    Osteoarthritis    Peripheral vascular disease ([place])    6 yrs ago, DVT in Lt knee/ groin   Pulmonary embolism (HCC)    hx of x 3 per patient; most recent 02/2014   Sinusitis    Sleep apnea     Family History  Problem Relation Age of Onset   Kidney disease Father  Diabetes Father     Colon cancer Neg Hx     Esophageal cancer Neg Hx     Rectal cancer Neg Hx     Stomach cancer Neg Hx    :     Significant Hospital Events: Including procedures, antibiotic start and stop dates in addition to other pertinent events   Admission 05/20/21, started on Heparin gtt 6/14 PCCM, IR consulted-- CT venogram to assess IVC filter and for IVC thrombus  Interim History / Subjective:  He denies complaints. He slept well overnight.   Objective   Blood pressure 125/85, pulse 79, temperature 98 F (36.7 C), temperature source Oral, resp. rate 20, height 5\' 7"   (1.702 m), weight 124.3 kg, SpO2 100 %.        Intake/Output Summary (Last 24 hours) at 05/22/2021 0803 Last data filed at 05/22/2021 0536 Gross per 24 hour  Intake 714.45 ml  Output 2300 ml  Net -1585.55 ml    Filed Weights   05/19/21 2058  Weight: 124.3 kg    Examination: General: healthy appearing middle aged man lying in bed in NAD HENT: [place]/AT, eyes anicteric Lungs: breathing comfortably on RA, CTAB anteriorly  Cardiovascular:  S1S2, RRR Abdomen: obese, soft, NT Extremities: mild BLE edema, varicose veins, no cyanosis or clubbing Neuro: sleeping but easily arousable to stimulation,  Derm: warm, dry  Labs/imaging that I havepersonally reviewed  (right click and "Reselect all SmartList Selections" daily)   H/H 13.9/43.6 Platelets 79  Resolved Hospital Problem list       Assessment & Plan:  Acute PE in LLL at least; other clots may also be acute or may be more chronic. He has an undiagnosed hypercoagulability disorder to have recurrent clots despite prophylaxis. PESI score II, a/w 1.7-3.5% risk of death in the next 30 days. IVC not with significant clot burden, IVC filter appears patent. -agree with heparin for now; will need OP [place] [facility]WH long-term -I am concerned that he will not be able to break down these clots due to hypercoagulability disorder and he would therefore be at higher risk for CTEPH. Discussed with Hematology and IR.  With thrombocytopenia catheter directed lytics seems more dangerous than doing embolectomy, but his clot location may not be as amenable to embolectomy.  -Discussed with IR, who recommends [facility] for 3-5 days, then repeat CTA to determine if he has significant residual clot-- can recheck AM on 6/17 or later. If he does, they would consider embolectomy at that time. Ok to have a diet today. -Needs outpatient hypercoagulability testing when appropriate  -needs lifelong AC -needs age appropriate cancer screening as an outpatient   Thrombocytopenia-  chronic. Hematology suspects autoimmune thrombocytopenia. -monitor for bleeding -no current indication for transfusion    Best practice (right click and "Reselect all SmartList Selections" daily)  Per primary  Labs   CBC: Recent Labs  Lab 05/19/21 2059 05/21/21 0222 05/22/21 0306  WBC 6.9 6.2 4.8  NEUTROABS  --  4.0 2.4  HGB 15.0 13.7 13.9  HCT 47.6 42.8 43.6  MCV 101.9* 99.5 100.5*  PLT 80* 74* 79*     Basic Metabolic Panel: Recent Labs  Lab 05/19/21 2059 05/21/21 0222 05/22/21 0306  NA 139 139 137  K 4.4 3.9 4.2  CL 105 106 104  CO2 25 23 23   GLUCOSE 143* 122* 120*  BUN 22* 23* 24*  CREATININE 1.13 1.04 1.01  CALCIUM 9.3 8.6* 8.6*     Julian Hy, DO 05/22/21 8:51 AM [facility]

## 2021-05-23 DIAGNOSIS — I2510 Atherosclerotic heart disease of native coronary artery without angina pectoris: Secondary | ICD-10-CM | POA: Diagnosis not present

## 2021-05-23 DIAGNOSIS — I10 Essential (primary) hypertension: Secondary | ICD-10-CM | POA: Diagnosis not present

## 2021-05-23 LAB — APTT
aPTT: 56 seconds — ABNORMAL HIGH (ref 24–36)
aPTT: 108 seconds — ABNORMAL HIGH (ref 24–36)
aPTT: 124 seconds — ABNORMAL HIGH (ref 24–36)

## 2021-05-23 LAB — HEPARIN LEVEL (UNFRACTIONATED)
Heparin Unfractionated: 0.37 IU/mL (ref 0.30–0.70)
Heparin Unfractionated: >1.1 IU/mL — ABNORMAL HIGH (ref 0.30–0.70)
Heparin Unfractionated: >1.1 IU/mL — ABNORMAL HIGH (ref 0.30–0.70)

## 2021-05-23 LAB — CBC
HCT: 42.6 % (ref 39.0–52.0)
Hemoglobin: 13.9 g/dL (ref 13.0–17.0)
MCH: 32.3 pg (ref 26.0–34.0)
MCHC: 32.6 g/dL (ref 30.0–36.0)
MCV: 98.8 fL (ref 80.0–100.0)
Platelets: 80 10*3/uL — ABNORMAL LOW (ref 150–400)
RBC: 4.31 MIL/uL (ref 4.22–5.81)
RDW: 14.7 % (ref 11.5–15.5)
WBC: 4.2 10*3/uL (ref 4.0–10.5)
nRBC: 0 % (ref 0.0–0.2)

## 2021-05-23 LAB — GLUCOSE, CAPILLARY
Glucose-Capillary: 124 mg/dL — ABNORMAL HIGH (ref 70–99)
Glucose-Capillary: 159 mg/dL — ABNORMAL HIGH (ref 70–99)
Glucose-Capillary: 120 mg/dL — ABNORMAL HIGH (ref 70–99)
Glucose-Capillary: 114 mg/dL — ABNORMAL HIGH (ref 70–99)

## 2021-05-23 NOTE — Plan of Care (Signed)
  Problem: Education: Goal: Knowledge of General Education information will improve Description Including pain rating scale, medication(s)/side effects and non-pharmacologic comfort measures Outcome: Progressing   Problem: Health Behavior/Discharge Planning: Goal: Ability to manage health-related needs will improve Outcome: Progressing   

## 2021-05-23 NOTE — Progress Notes (Signed)
ANTICOAGULATION CONSULT NOTE - Follow Up Consult  Pharmacy Consult for IV Heparin Indication: pulmonary embolus  Allergies  Allergen Reactions   Penicillins Hives    Has patient had a PCN reaction causing immediate rash, facial/tongue/throat swelling, SOB or lightheadedness with hypotension: No Has patient had a PCN reaction causing severe rash involving mucus membranes or skin necrosis: No Has patient had a PCN reaction that required hospitalization No Has patient had a PCN reaction occurring within the last 10 years: No If all of the above answers are "NO", then may proceed with Cephalosporin use.    Patient Measurements: Height: 5\' 7"  (170.2 cm) Weight: 124.3 kg (274 lb) IBW/kg (Calculated) : 66.1 Heparin Dosing Weight: 95.1 kg  Vital Signs: Temp: 97.5 F (36.4 C) (06/16 0319) Temp Source: Axillary (06/16 0319) BP: 133/79 (06/16 0319) Pulse Rate: 77 (06/16 0319)  Labs: Recent Labs    05/20/21 1300 05/20/21 1455 05/20/21 1903 05/21/21 0222 05/21/21 1038 05/22/21 0306 05/22/21 0938 05/22/21 1757 05/23/21 0139  HGB  --   --    < > 13.7  --  13.9  --   --  13.9  HCT  --   --   --  42.8  --  43.6  --   --  42.6  PLT  --   --   --  74*  --  79*  --   --  80*  APTT 25  --    < > 50*   < > 66* 77* 45* 56*  LABPROT 14.9  --   --   --   --   --   --   --   --   INR 1.2  --   --   --   --   --   --   --   --   HEPARINUNFRC  --   --   --  0.36   < >  --  0.87* 0.39 0.37  CREATININE  --   --   --  1.04  --  1.01  --   --   --   TROPONINIHS 26* 22*  --   --   --   --   --   --   --    < > = values in this interval not displayed.     Estimated Creatinine Clearance: 107 mL/min (by C-G formula based on SCr of 1.01 mg/dL).   Medical History: Past Medical History:  Diagnosis Date   Anemia    Carpal tunnel syndrome    Deep vein thrombosis ([place]) 07/2013, 10/2014, 03/16/2015   LLE (studies at [facility])   Depression    Diabetes mellitus    Hyperlipidemia    Hypertension     Obesity    Osteoarthritis    Peripheral vascular disease ([place])    6 yrs ago, DVT in Lt knee/ groin   Pulmonary embolism (HCC)    hx of x 3 per patient; most recent 02/2014   Sinusitis    Sleep apnea     Assessment: 55 yr old man presented to ED with increased work of breathing, pain on deep breaths, D-dimer >20 and CT chest positive for extensive bilateral pulmonary embolism with R heart strain. Pharmacy was consulted to dose IV heparin. Pt was on apixaban PTA for hx of PE/DVT (last dose on 6/12 at 0900 AM). Given recent apixaban exposure, aPTT monitoring initated until aPTT and heparin levels are correlated. Of note, pt had IVC filter placed in 2015  at time of last PE.  IR and Onc are no longer considering thrombolytic therapy per most recent notes. Onc recommending transition to Arixtra or lovenox post-heparin.  6/16 AM update:  aPTT low Plts low but stable Hgb stable  Goal of Therapy:  Heparin level 0.3-0.7 units/ml aPTT 66-102 seconds Monitor platelets by anticoagulation protocol: Yes   Plan:  Increase heparin drip to 2650 units/hr 1300 aPTT/heparin level Monitor for bleeding  Narda Bonds, PharmD, BCPS Clinical Pharmacist Phone: [phone]

## 2021-05-23 NOTE — Progress Notes (Signed)
PROGRESS NOTE    Travis Rollins  NWG:[id number] DOB: [birth year] DOA: 05/19/2021 PCP: Boyce Medici, FNP    Brief Narrative:  55 year old gentleman history of recurrent PEs was on Coumadin, Xarelto, Pradaxa, and currently on apixaban presented to the ED with left lower back pain and shortness of breath which has been worsening x5 days.  Patient status post recent COVID-19 booster 05/10/2021.  CT angiogram chest performed with extensive bilateral PE with right ventricular strain.  CT renal stone protocol with hepatic steatosis otherwise unremarkable.  Lower extremity Dopplers done with age  indeterminate DVT of right gastrocnemius vein.  2D echo ordered and pending.  Hematology consulted patient seen in consultation by Dr. Marin Olp who is recommending evaluation by IR or vascular surgery for evaluation for thrombolytic therapy.  Also concern for IVC filter being clogged with clots.  Anticoagulation panel pending.  PCCM and IR consulted for further evaluation and management.  Assessment & Plan:   Principal Problem:   Bilateral pulmonary embolism (HCC) Active Problems:   DM (diabetes mellitus), type 2 with renal complications (HCC)   Thrombocytopenia (HCC)   Coronary artery disease involving native coronary artery of native heart without angina pectoris   Mixed hyperlipidemia   Essential hypertension   Type 2 diabetes mellitus without complication, with long-term current use of insulin ([place])  1 recurrent bilateral pulmonary embolism with right ventricular strain -Patient noted to have had a history of PE status post IVC filter, has been on anticoagulation with Coumadin, Pradaxa, Xarelto, apixaban and still developing recurrent clots. -CT angiogram done with extensive bilateral PE with right ventricular strain. -2D echo pending. -Lower extremity Dopplers with age-indeterminate right gastrocnemius vein DVT. -Hypercoagulable in progress -PCCM, Hematology, IR following. Recommendation for  continued heparin gtt -Per Hematology this AM, there is consideration for possible coumadin at time of d/c  2.  Chronic thrombocytopenia -Patient noted never have to seen a hematologist in the past. -Patient with no overt bleeding. -Currently on heparin drip per above -Statin and allopurinol on hold. -f/u CBC in AM  3.  Diabetes mellitus type 2 -Hemoglobin A1c 5.9 (03/12/2016) -Repeat hemoglobin A1c of 7.6 -Continue CBG before meals and at bedtime -On Lantus dose to 13 units daily as patient to be n.p.o. after midnight.   -Continue with SSI.  4.  Coronary artery disease -Remains stable -2D echo pending -Continue beta-blocker.  5.  Hypertension -BP remains stable -Monitor blood pressure secondary to problem #1 -Continue lisinopril, Toprol-XL.  6.  Hyperlipidemia -Statin on hold secondary to thrombocytopenia  7.  Venous insufficiency -Remains stable  8.  Weight management -Continue Topamax.    DVT prophylaxis: Heparin gtt Code Status: Full Family Communication: Pt in room, family not at bedside  Status is: Inpatient  Remains inpatient appropriate because:Inpatient level of care appropriate due to severity of illness  Dispo: The patient is from: Home              Anticipated d/c is to: Home              Patient currently is not medically stable to d/c.   Difficult to place patient No       Consultants:  PCCM Hematology IR  Procedures:    Antimicrobials: Anti-infectives (From admission, onward)    None       Subjective: Overall feeling better. Reports no further back pains  Objective: Vitals:   05/22/21 2311 05/23/21 0319 05/23/21 0400 05/23/21 1203  BP: 125/85 133/79  121/79  Pulse: 66 77  76  Resp: (!) 24 (!) 21  (!) 24  Temp: (!) 97.5 F (36.4 C) (!) 97.5 F (36.4 C) 98 F (36.7 C) 98.4 F (36.9 C)  TempSrc: Oral Axillary Oral Oral  SpO2: 99% 97%  97%  Weight:      Height:        Intake/Output Summary (Last 24 hours) at 05/23/2021  1612 Last data filed at 05/23/2021 1406 Gross per 24 hour  Intake 561.42 ml  Output 1100 ml  Net -538.58 ml    Filed Weights   05/19/21 2058  Weight: 124.3 kg    Examination: General exam: Conversant, in no acute distress Respiratory system: normal chest rise, clear, no audible wheezing Cardiovascular system: regular rhythm, s1-s2 Gastrointestinal system: Nondistended, nontender, pos BS Central nervous system: No seizures, no tremors Extremities: No cyanosis, no joint deformities Skin: No rashes, no pallor Psychiatry: Affect normal // no auditory hallucinations   Data Reviewed: I have personally reviewed following labs and imaging studies  CBC: Recent Labs  Lab 05/19/21 2059 05/21/21 0222 05/22/21 0306 05/23/21 0139  WBC 6.9 6.2 4.8 4.2  NEUTROABS  --  4.0 2.4  --   HGB 15.0 13.7 13.9 13.9  HCT 47.6 42.8 43.6 42.6  MCV 101.9* 99.5 100.5* 98.8  PLT 80* 74* 79* 80*    Basic Metabolic Panel: Recent Labs  Lab 05/19/21 2059 05/21/21 0222 05/22/21 0306  NA 139 139 137  K 4.4 3.9 4.2  CL 105 106 104  CO2 25 23 23   GLUCOSE 143* 122* 120*  BUN 22* 23* 24*  CREATININE 1.13 1.04 1.01  CALCIUM 9.3 8.6* 8.6*    GFR: Estimated Creatinine Clearance: 107 mL/min (by C-G formula based on SCr of 1.01 mg/dL). Liver Function Tests: Recent Labs  Lab 05/21/21 0222  AST 16  ALT 17  ALKPHOS 58  BILITOT 0.9  PROT 7.3  ALBUMIN 3.1*    No results for input(s): LIPASE, AMYLASE in the last 168 hours. No results for input(s): AMMONIA in the last 168 hours. Coagulation Profile: Recent Labs  Lab 05/20/21 1300  INR 1.2    Cardiac Enzymes: No results for input(s): CKTOTAL, CKMB, CKMBINDEX, TROPONINI in the last 168 hours. BNP (last 3 results) No results for input(s): PROBNP in the last 8760 hours. HbA1C: Recent Labs    05/21/21 0222  HGBA1C 7.6*    CBG: Recent Labs  Lab 05/22/21 1243 05/22/21 1624 05/22/21 1947 05/23/21 0726 05/23/21 1200  GLUCAP 99 126*  181* 124* 159*    Lipid Profile: No results for input(s): CHOL, HDL, LDLCALC, TRIG, CHOLHDL, LDLDIRECT in the last 72 hours. Thyroid Function Tests: No results for input(s): TSH, T4TOTAL, FREET4, T3FREE, THYROIDAB in the last 72 hours. Anemia Panel: No results for input(s): VITAMINB12, FOLATE, FERRITIN, TIBC, IRON, RETICCTPCT in the last 72 hours. Sepsis Labs: No results for input(s): PROCALCITON, LATICACIDVEN in the last 168 hours.  Recent Results (from the past 240 hour(s))  Resp Panel by RT-PCR (Flu A&B, Covid) Nasopharyngeal Swab     Status: None   Collection Time: 05/20/21  1:08 PM   Specimen: Nasopharyngeal Swab; Nasopharyngeal(NP) swabs in vial transport medium  Result Value Ref Range Status   SARS Coronavirus 2 by RT PCR NEGATIVE NEGATIVE Final    Comment: (NOTE) SARS-CoV-2 target nucleic acids are NOT DETECTED.  The SARS-CoV-2 RNA is generally detectable in upper respiratory specimens during the acute phase of infection. The lowest concentration of SARS-CoV-2 viral copies this assay can detect is 138 copies/mL. A  negative result does not preclude SARS-Cov-2 infection and should not be used as the sole basis for treatment or other patient management decisions. A negative result may occur with  improper specimen collection/handling, submission of specimen other than nasopharyngeal swab, presence of viral mutation(s) within the areas targeted by this assay, and inadequate number of viral copies(<138 copies/mL). A negative result must be combined with clinical observations, patient history, and epidemiological information. The expected result is Negative.  Fact Sheet for Patients:  EntrepreneurPulse.com.au  Fact Sheet for Healthcare Providers:  IncredibleEmployment.be  This test is no t yet approved or cleared by the [place] FDA and  has been authorized for detection and/or diagnosis of SARS-CoV-2 by FDA under an Emergency Use  Authorization (EUA). This EUA will remain  in effect (meaning this test can be used) for the duration of the COVID-19 declaration under Section 564(b)(1) of the Act, 21 U.S.C.section 360bbb-3(b)(1), unless the authorization is terminated  or revoked sooner.       Influenza A by PCR NEGATIVE NEGATIVE Final   Influenza B by PCR NEGATIVE NEGATIVE Final    Comment: (NOTE) The Xpert Xpress SARS-CoV-2/FLU/RSV plus assay is intended as an aid in the diagnosis of influenza from Nasopharyngeal swab specimens and should not be used as a sole basis for treatment. Nasal washings and aspirates are unacceptable for Xpert Xpress SARS-CoV-2/FLU/RSV testing.  Fact Sheet for Patients: EntrepreneurPulse.com.au  Fact Sheet for Healthcare Providers: IncredibleEmployment.be  This test is not yet approved or cleared by the [place] FDA and has been authorized for detection and/or diagnosis of SARS-CoV-2 by FDA under an Emergency Use Authorization (EUA). This EUA will remain in effect (meaning this test can be used) for the duration of the COVID-19 declaration under Section 564(b)(1) of the Act, 21 U.S.C. section 360bbb-3(b)(1), unless the authorization is terminated or revoked.  Performed at [facility], [place] [address] [place], [place] [address]       Radiology Studies: VAS [place] UPPER EXTREMITY VENOUS DUPLEX  Result Date: 05/22/2021 UPPER VENOUS STUDY  Patient Name:  Travis Rollins  Date of Exam:   05/22/2021 Medical Rec #: [id number]           Accession #:    [id number] Date of Birth: [birth year]            Patient Gender: M Patient Age:   54Y Exam Location:  [facility] Procedure:      VAS [place] UPPER EXTREMITY VENOUS DUPLEX Referring Phys: [id number] ADAM HENN --------------------------------------------------------------------------------  Indications: pulmonary embolism Comparison Study: no prior Performing Technologist: Archie Patten RVS   Examination Guidelines: A complete evaluation includes B-mode imaging, spectral Doppler, color Doppler, and power Doppler as needed of all accessible portions of each vessel. Bilateral testing is considered an integral part of a complete examination. Limited examinations for reoccurring indications may be performed as noted.  Right Findings: +----------+------------+---------+-----------+----------+-------+ RIGHT     CompressiblePhasicitySpontaneousPropertiesSummary +----------+------------+---------+-----------+----------+-------+ IJV           Full       Yes       Yes                      +----------+------------+---------+-----------+----------+-------+ Subclavian    Full       Yes       Yes                      +----------+------------+---------+-----------+----------+-------+ Axillary      Full  Yes       Yes                      +----------+------------+---------+-----------+----------+-------+ Brachial      Full       Yes       Yes                      +----------+------------+---------+-----------+----------+-------+ Radial        Full                                          +----------+------------+---------+-----------+----------+-------+ Ulnar         Full                                          +----------+------------+---------+-----------+----------+-------+ Cephalic      Full                                          +----------+------------+---------+-----------+----------+-------+ Basilic       Full                                          +----------+------------+---------+-----------+----------+-------+  Left Findings: +----------+------------+---------+-----------+----------+-------+ LEFT      CompressiblePhasicitySpontaneousPropertiesSummary +----------+------------+---------+-----------+----------+-------+ IJV           Full       Yes       Yes                       +----------+------------+---------+-----------+----------+-------+ Subclavian    Full       Yes       Yes                      +----------+------------+---------+-----------+----------+-------+ Axillary      Full       Yes       Yes                      +----------+------------+---------+-----------+----------+-------+ Brachial      Full       Yes       Yes                      +----------+------------+---------+-----------+----------+-------+ Radial        Full                                          +----------+------------+---------+-----------+----------+-------+ Ulnar         Full                                          +----------+------------+---------+-----------+----------+-------+ Cephalic      Full                                          +----------+------------+---------+-----------+----------+-------+  Basilic       Full                                          +----------+------------+---------+-----------+----------+-------+  Summary: No evidence of deep vein or superficial vein thrombosis involving the right and left upper extremities.  *See table(s) above for measurements and observations.  Diagnosing physician: Harold Barban MD Electronically signed by Harold Barban MD on 05/22/2021 at 7:51:17 PM.    Final     Scheduled Meds:  furosemide  40 mg Oral q morning   insulin aspart  0-20 Units Subcutaneous TID WC   insulin glargine  13 Units Subcutaneous QHS   lidocaine  1 patch Transdermal Q24H   lisinopril  2.5 mg Oral Daily   metoprolol succinate  50 mg Oral QHS   polycarbophil  625 mg Oral Daily   potassium chloride  10 mEq Oral QHS   sodium chloride flush  3 mL Intravenous Q12H   tamsulosin  0.4 mg Oral QPC breakfast   topiramate  50 mg Oral BID   Continuous Infusions:  sodium chloride     heparin 2,550 Units/hr (05/23/21 1500)     LOS: 3 days   Marylu Lund, MD [facility] Pager On Amion  If 7PM-7AM, please contact  night-coverage 05/23/2021, 4:12 PM

## 2021-05-23 NOTE — Progress Notes (Signed)
ANTICOAGULATION CONSULT NOTE - Follow Up Consult  Pharmacy Consult for IV Heparin Indication: pulmonary embolus  Allergies  Allergen Reactions   Penicillins Hives    Has patient had a PCN reaction causing immediate rash, facial/tongue/throat swelling, SOB or lightheadedness with hypotension: No Has patient had a PCN reaction causing severe rash involving mucus membranes or skin necrosis: No Has patient had a PCN reaction that required hospitalization No Has patient had a PCN reaction occurring within the last 10 years: No If all of the above answers are "NO", then may proceed with Cephalosporin use.    Patient Measurements: Height: 5\' 7"  (170.2 cm) Weight: 124.3 kg (274 lb) IBW/kg (Calculated) : 66.1 Heparin Dosing Weight: 95.1 kg  Vital Signs: Temp: 97.8 F (36.6 C) (06/16 2007) Temp Source: Axillary (06/16 2007) BP: 131/85 (06/16 2021) Pulse Rate: 76 (06/16 2007)  Labs: Recent Labs    05/21/21 0222 05/21/21 1038 05/22/21 0306 05/22/21 0938 05/23/21 0139 05/23/21 1308 05/23/21 2123  HGB 13.7  --  13.9  --  13.9  --   --   HCT 42.8  --  43.6  --  42.6  --   --   PLT 74*  --  79*  --  80*  --   --   APTT 50*   < > 66*   < > 56* 108* 124*  HEPARINUNFRC 0.36   < >  --    < > 0.37 >1.10* >1.10*  CREATININE 1.04  --  1.01  --   --   --   --    < > = values in this interval not displayed.     Estimated Creatinine Clearance: 107 mL/min (by C-G formula based on SCr of 1.01 mg/dL).   Medical History: Past Medical History:  Diagnosis Date   Anemia    Carpal tunnel syndrome    Deep vein thrombosis ([place]) 07/2013, 10/2014, 03/16/2015   LLE (studies at [facility])   Depression    Diabetes mellitus    Hyperlipidemia    Hypertension    Obesity    Osteoarthritis    Peripheral vascular disease ([place])    6 yrs ago, DVT in Lt knee/ groin   Pulmonary embolism (HCC)    hx of x 3 per patient; most recent 02/2014   Sinusitis    Sleep apnea     Assessment: 55 yr old man  presented to ED with increased work of breathing, pain on deep breaths, D-dimer >20 and CT chest positive for extensive bilateral pulmonary embolism with R heart strain. Pharmacy was consulted to dose IV heparin. Pt was on apixaban PTA for hx of PE/DVT (last dose on 6/12 at 0900 AM). Given recent apixaban exposure, aPTT monitoring initated until aPTT and heparin levels are correlated. Of note, pt had IVC filter placed in 2015 at time of last PE.  IR and Onc are no longer considering thrombolytic therapy per most recent notes. Onc recommending transition to Arixtra or lovenox post-heparin.  6/16 PM update:  aPTT elevated  Goal of Therapy:  Heparin level 0.3-0.7 units/ml aPTT 66-102 seconds Monitor platelets by anticoagulation protocol: Yes   Plan:  Dec heparin to 2350 units/hr 0800 heparin level and aPTT Monitor for bleeding  Narda Bonds, PharmD, BCPS Clinical Pharmacist Phone: [phone]

## 2021-05-23 NOTE — Progress Notes (Signed)
ANTICOAGULATION CONSULT NOTE - Follow Up Consult  Pharmacy Consult for IV Heparin Indication: pulmonary embolus  Allergies  Allergen Reactions   Penicillins Hives    Has patient had a PCN reaction causing immediate rash, facial/tongue/throat swelling, SOB or lightheadedness with hypotension: No Has patient had a PCN reaction causing severe rash involving mucus membranes or skin necrosis: No Has patient had a PCN reaction that required hospitalization No Has patient had a PCN reaction occurring within the last 10 years: No If all of the above answers are "NO", then may proceed with Cephalosporin use.    Patient Measurements: Height: 5\' 7"  (170.2 cm) Weight: 124.3 kg (274 lb) IBW/kg (Calculated) : 66.1 Heparin Dosing Weight: 95.1 kg  Vital Signs: Temp: 98.4 F (36.9 C) (06/16 1203) Temp Source: Oral (06/16 1203) BP: 121/79 (06/16 1203) Pulse Rate: 76 (06/16 1203)  Labs: Recent Labs    05/20/21 1455 05/20/21 1903 05/21/21 0222 05/21/21 1038 05/22/21 0306 05/22/21 0938 05/22/21 1757 05/23/21 0139 05/23/21 1308  HGB  --    < > 13.7  --  13.9  --   --  13.9  --   HCT  --   --  42.8  --  43.6  --   --  42.6  --   PLT  --   --  74*  --  79*  --   --  80*  --   APTT  --    < > 50*   < > 66*   < > 45* 56* 108*  HEPARINUNFRC  --   --  0.36   < >  --    < > 0.39 0.37 >1.10*  CREATININE  --   --  1.04  --  1.01  --   --   --   --   TROPONINIHS 22*  --   --   --   --   --   --   --   --    < > = values in this interval not displayed.     Estimated Creatinine Clearance: 107 mL/min (by C-G formula based on SCr of 1.01 mg/dL).   Medical History: Past Medical History:  Diagnosis Date   Anemia    Carpal tunnel syndrome    Deep vein thrombosis ([place]) 07/2013, 10/2014, 03/16/2015   LLE (studies at [facility])   Depression    Diabetes mellitus    Hyperlipidemia    Hypertension    Obesity    Osteoarthritis    Peripheral vascular disease ([place])    6 yrs ago, DVT in Lt knee/ groin    Pulmonary embolism (HCC)    hx of x 3 per patient; most recent 02/2014   Sinusitis    Sleep apnea     Assessment: 55 yr old man presented to ED with increased work of breathing, pain on deep breaths, D-dimer >20 and CT chest positive for extensive bilateral pulmonary embolism with R heart strain. Pharmacy was consulted to dose IV heparin. Pt was on apixaban PTA for hx of PE/DVT (last dose on 6/12 at 0900 AM). Given recent apixaban exposure, aPTT monitoring initated until aPTT and heparin levels are correlated. Of note, pt had IVC filter placed in 2015 at time of last PE.  Pt is now dx with Lupus anticoagulant and is currently on IV heparin. His heparin level and PTT are not correlating yet. Plan is for potential coumadin. PTT came back today at 108 and HL >1.1.  Goal of Therapy:  Heparin  level 0.3-0.7 units/ml aPTT 66-102 seconds Monitor platelets by anticoagulation protocol: Yes   Plan:  Decrease heparin drip to 2550 units/hr 2100 aPTT/heparin level Monitor for bleeding  Onnie Boer, PharmD, BCIDP, AAHIVP, CPP Infectious Disease Pharmacist 05/23/2021 2:31 PM

## 2021-05-23 NOTE — Care Management Important Message (Signed)
Important Message  Patient Details  Name: Travis Rollins MRN: [id number] Date of Birth: 05-Mar-1966   Medicare Important Message Given:  Yes     Almond Fitzgibbon [address] 05/23/2021, 3:43 PM

## 2021-05-23 NOTE — Progress Notes (Signed)
I guess I really should not be all that surprised that he does have a lupus anticoagulant.  This can certainly explain the lower platelet count.  Typically, the anticoagulant of choice for the lupus anticoagulant/anticardiolipin antibody is Coumadin.  I would just not sure that he would do well with Coumadin.  I know this would certainly be easier on him.  I would have to ask him when he was last on Coumadin.  His echocardiogram did not show any right heart strain.  As such, there is no defined role for thrombolytic therapy at the present time.  He is on the heparin infusion.  He feels well.  His oxygen saturation is 97%.  He is out of bed.  There is no bleeding.  He has had no chest wall pain.  There is no obvious shortness of breath.  He has had no nausea or vomiting.  There is been no issues with bowels or bladder.  On his physical exam, his temperature is 97.5.  Pulse 77.  Blood pressure 133/79.  His lungs are clear bilaterally.  He has no wheezes.  Cardiac exam regular rate and rhythm.  He has no murmurs, rubs or bruits.  Abdomen is soft.  He has decreased bowel sounds.  There is no guarding or rebound tenderness.  For the present time, he is on the heparin infusion.  I would continue him on the heparin infusion for right now.  Again, I need to find out when he was last on Coumadin.  We could certainly consider this again.  I would have to make sure that his INR is on the higher end of therapeutic.  I appreciate the wonderful care that he is getting from all staff up on [address].   Lattie Haw, MD  [place] 5:25

## 2021-05-24 ENCOUNTER — Inpatient Hospital Stay (HOSPITAL_COMMUNITY): Payer: Medicare (Managed Care)

## 2021-05-24 DIAGNOSIS — I10 Essential (primary) hypertension: Secondary | ICD-10-CM | POA: Diagnosis not present

## 2021-05-24 DIAGNOSIS — I2699 Other pulmonary embolism without acute cor pulmonale: Secondary | ICD-10-CM | POA: Diagnosis not present

## 2021-05-24 DIAGNOSIS — I2510 Atherosclerotic heart disease of native coronary artery without angina pectoris: Secondary | ICD-10-CM | POA: Diagnosis not present

## 2021-05-24 LAB — CBC
HCT: 46 % (ref 39.0–52.0)
Hemoglobin: 14.6 g/dL (ref 13.0–17.0)
MCH: 31.5 pg (ref 26.0–34.0)
MCHC: 31.7 g/dL (ref 30.0–36.0)
MCV: 99.4 fL (ref 80.0–100.0)
Platelets: 93 10*3/uL — ABNORMAL LOW (ref 150–400)
RBC: 4.63 MIL/uL (ref 4.22–5.81)
RDW: 14.4 % (ref 11.5–15.5)
WBC: 3.4 10*3/uL — ABNORMAL LOW (ref 4.0–10.5)
nRBC: 0 % (ref 0.0–0.2)

## 2021-05-24 LAB — APTT: aPTT: 140 seconds — ABNORMAL HIGH (ref 24–36)

## 2021-05-24 LAB — GLUCOSE, CAPILLARY
Glucose-Capillary: 176 mg/dL — ABNORMAL HIGH (ref 70–99)
Glucose-Capillary: 101 mg/dL — ABNORMAL HIGH (ref 70–99)
Glucose-Capillary: 107 mg/dL — ABNORMAL HIGH (ref 70–99)
Glucose-Capillary: 107 mg/dL — ABNORMAL HIGH (ref 70–99)

## 2021-05-24 LAB — HEPARIN LEVEL (UNFRACTIONATED): Heparin Unfractionated: >1.1 IU/mL — ABNORMAL HIGH (ref 0.30–0.70)

## 2021-05-24 LAB — FACTOR 5 LEIDEN

## 2021-05-24 MED ORDER — ENOXAPARIN SODIUM 120 MG/0.8ML IJ SOSY
120.0000 mg | PREFILLED_SYRINGE | Freq: Two times a day (BID) | INTRAMUSCULAR | Status: DC
  Administered 2021-05-25 – 2021-05-30 (×11): 120 mg via SUBCUTANEOUS
  Filled 2021-05-24 (×12): qty 0.8

## 2021-05-24 MED ORDER — IOHEXOL 350 MG/ML SOLN
100.0000 mL | Freq: Once | INTRAVENOUS | Status: AC | PRN
  Administered 2021-05-24: 80 mL via INTRAVENOUS

## 2021-05-24 MED ORDER — WARFARIN SODIUM 5 MG PO TABS
10.0000 mg | ORAL_TABLET | Freq: Once | ORAL | Status: AC
  Administered 2021-05-24: 10 mg via ORAL
  Filled 2021-05-24: qty 2

## 2021-05-24 MED ORDER — ENOXAPARIN SODIUM 120 MG/0.8ML IJ SOSY
120.0000 mg | PREFILLED_SYRINGE | Freq: Once | INTRAMUSCULAR | Status: AC
  Administered 2021-05-24: 120 mg via SUBCUTANEOUS
  Filled 2021-05-24: qty 0.8

## 2021-05-24 MED ORDER — WARFARIN - PHARMACIST DOSING INPATIENT: Freq: Every day | Status: DC

## 2021-05-24 NOTE — Progress Notes (Signed)
Everything seems to be going pretty well for Travis Rollins.  He is on a heparin infusion still.  He does have a lupus anticoagulant.  His last Coumadin was back in 2012 or 2013 he thinks.  As such, I think it would be reasonable to try him on Coumadin again.  I would keep his INR at least 3-3.5.  I do think Coumadin would be reasonable to try.  It certainly will be a lot more patient friendly then injections.  He does have the thrombocytopenia.  Again this might be part of the lupus anticoagulant.  He has had no obvious bleeding.  He is out of bed.  His heparin levels are being adjusted by pharmacy.  They are not a great job.  I doubt that he is going to need any type of thrombolytic therapy or thrombectomy.  He certainly seems to be doing quite well.  This is vital signs all seem stable.  His temperature is 97.8.  His pulse is 77.  Blood pressure 132/72.  Oxygen saturation is 99%.  His lungs are clear bilaterally.  Cardiac exam regular rate and rhythm.  Abdomen is soft.  Again, we will try him on Coumadin.  I think this would be reasonable.  Is been almost 10 years since he has been on Coumadin.  We can monitor him closely.  Again, I would keep his INR at least 3.0.  I would keep him on heparin until the Coumadin is therapeutic.  I appreciate the outstanding care he is getting from all the staff up on [address].  Lattie Haw, MD  [address] [place] 3:13

## 2021-05-24 NOTE — Progress Notes (Addendum)
ANTICOAGULATION CONSULT NOTE - Follow Up Consult  Pharmacy Consult for IV Heparin>>Lovenox/coumadin Indication: pulmonary embolus  Allergies  Allergen Reactions   Penicillins Hives    Has patient had a PCN reaction causing immediate rash, facial/tongue/throat swelling, SOB or lightheadedness with hypotension: No Has patient had a PCN reaction causing severe rash involving mucus membranes or skin necrosis: No Has patient had a PCN reaction that required hospitalization No Has patient had a PCN reaction occurring within the last 10 years: No If all of the above answers are "NO", then may proceed with Cephalosporin use.    Patient Measurements: Height: 5\' 7"  (170.2 cm) Weight: 124.3 kg (274 lb) IBW/kg (Calculated) : 66.1 Heparin Dosing Weight: 95.1 kg  Vital Signs: Temp: 97.8 F (36.6 C) (06/17 0312) Temp Source: Axillary (06/17 0312) BP: 123/72 (06/17 0312) Pulse Rate: 77 (06/17 0312)  Labs: Recent Labs    05/22/21 0306 05/22/21 0938 05/23/21 0139 05/23/21 1308 05/23/21 2123  HGB 13.9  --  13.9  --   --   HCT 43.6  --  42.6  --   --   PLT 79*  --  80*  --   --   APTT 66*   < > 56* 108* 124*  HEPARINUNFRC  --    < > 0.37 >1.10* >1.10*  CREATININE 1.01  --   --   --   --    < > = values in this interval not displayed.     Estimated Creatinine Clearance: 107 mL/min (by C-G formula based on SCr of 1.01 mg/dL).   Medical History: Past Medical History:  Diagnosis Date   Anemia    Carpal tunnel syndrome    Deep vein thrombosis ([place]) 07/2013, 10/2014, 03/16/2015   LLE (studies at [facility])   Depression    Diabetes mellitus    Hyperlipidemia    Hypertension    Obesity    Osteoarthritis    Peripheral vascular disease ([place])    6 yrs ago, DVT in Lt knee/ groin   Pulmonary embolism (HCC)    hx of x 3 per patient; most recent 02/2014   Sinusitis    Sleep apnea     Assessment: 55 yr old man presented to ED with increased work of breathing, pain on deep breaths, D-dimer  >20 and CT chest positive for extensive bilateral pulmonary embolism with R heart strain. Pharmacy was consulted to dose IV heparin. Pt was on apixaban PTA for hx of PE/DVT (last dose on 6/12 at 0900 AM). Given recent apixaban exposure, aPTT monitoring initated until aPTT and heparin levels are correlated. Of note, pt had IVC filter placed in 2015 at time of last PE.  Pt is now dx with Lupus anticoagulant and is currently on IV heparin. His PTT and HL have not correlated yet. Plan to start coumadin today per Heme. Baseline INR 1.2 a few days ago. Goal of INR higher per heme.   Addendum  Ok to transition heparin to SQ lovenox for bridging until INR is therapeutic per Dr. Wyline Copas.   Goal of Therapy:  INR 3-3.5 Heparin level 0.3-0.7 units/ml aPTT 66-102 seconds Monitor platelets by anticoagulation protocol: Yes   Plan:  Heparin>>Lovenox 120mg  SQ BID Coumadin 10mg  PO x1 Daily INR Monitor for bleeding  Onnie Boer, PharmD, BCIDP, AAHIVP, CPP Infectious Disease Pharmacist 05/24/2021 8:40 AM

## 2021-05-24 NOTE — Progress Notes (Addendum)
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 BMI is 42.91.  Patient has been  taking apixaban 2.5 mg twice daily.   Patient presented to the hospital 05/20/21 with left lower back pain and shortness of breath x  5 days. He has his baseline chronic lower extremity edema. 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  CBC performed on admission showed a normal WBC and hemoglobin but platelet count was 80,000.  Looking back over his platelet count over the past 7 years shows that his platelet count has been persistently low in the 80-110,000 range.  A CTA of the chest was performed on admission which showed extensive bilateral pulmonary embolism, right ventricular strain. ( RV/LV ratio is 0.86. ) PESI score is 2. CT renal stone study showed no acute finding, right hepatic steatosis.  Doppler ultrasound of the bilateral lower extremities has been performed and preliminary results show age indeterminate DVT involving the right gastrocnemius veins, no DVT on the left. An echocardiogram has been ordered.  He has been started on heparin.  In the Ed patient has significant back pain, he was afebrile without cough, or chest pain . No hemoptysis or bleeding. He has an IVC filter that was placed in 2015. Anticoagulant use/ history includes, warfarin in the past and states that this was changed due to development of clots while on this medication.  He has also taken Xarelto and Pradaxa in the past.  He can recall having melena and hematochezia while  taking Xarelto.  He cannot recall why Pradaxa was stopped but he suspects it was due to recurrent blood clots while taking this medication.  He states that he has been on Eliquis for about 4 to 5 years and has tolerated this  well.  He confirms Eliquis  dose of  2.5 mg twice a day. Prescribed by his PCP. He denies any missed doses. He has never been seen by hematologist in the past for his recurrent VTE, hypercoagulable state. No history of ETOH or tobacco use. No supplements or hormone replacement. No family history of clotting disorders that he is aware of.  PCCM have been asked to assess and evaluate for treatment options in this complicated case.   He appears comfortable on RA with sats of 99%. RR is 18, BP is 123/72. He does get short of breath with minimal exertion, respirations are very shallow.  Past Medical History:  Diagnosis Date   Anemia    Carpal tunnel syndrome    Deep vein thrombosis ([place]) 07/2013, 10/2014, 03/16/2015   LLE (studies at [facility])   Depression    Diabetes mellitus    Hyperlipidemia    Hypertension    Obesity    Osteoarthritis    Peripheral vascular disease ([place])    6 yrs ago, DVT in Lt knee/ groin   Pulmonary embolism (HCC)    hx of x 3 per patient; most recent 02/2014   Sinusitis    Sleep apnea     Family History  Problem Relation Age of Onset   Kidney disease Father  Diabetes Father     Colon cancer Neg Hx     Esophageal cancer Neg Hx     Rectal cancer Neg Hx     Stomach cancer Neg Hx    :     Significant Hospital Events: Including procedures, antibiotic start and stop dates in addition to other pertinent events   Admission 05/20/21, started on Heparin gtt 6/14 PCCM, IR consulted-- CT venogram to assess IVC filter and for IVC thrombus  Interim History / Subjective:  Awake and alert, sitting on the side of the bed on RA.  States his breathing is much improved. Remains on heparin gtt, but plan is to transition to Warfarin. Repeat CTA today (  results are pending) to determine need for procedure, but he is much improved on heparin gtt.  Platelets are 80,000 this morning   Objective   Blood pressure 123/72, pulse 77, temperature 97.8 F (36.6 C), temperature source Axillary, resp. rate 18, height 5\' 7"  (1.702 m), weight 124.3 kg, SpO2 99 %.        Intake/Output Summary (Last 24 hours) at 05/24/2021 0820 Last data filed at 05/24/2021 0730 Gross per 24 hour  Intake 225.22 ml  Output 1850 ml  Net -1624.78 ml   Filed Weights   05/19/21 2058  Weight: 124.3 kg    Examination: General: healthy appearing middle aged man sitting on side of bed in NAD HENT: [place]/AT, eyes anicteric Lungs: Bilateral chest excursion, breathing comfortably on RA, Sats 99%, Clear throughout Cardiovascular:  S1S2, RRR, SR per tele Abdomen: obese, soft, NT, ND, Body mass index is 42.91 kg/m.  Extremities: mild BLE edema, varicose veins, no cyanosis or clubbing brisk capillary refill Neuro: awake and conversational, MAE x 4, ambulatory Derm: warm, dry  Labs/imaging that I havepersonally reviewed  (right click and "Reselect all SmartList Selections" daily)   H/H 13.9/42.6 Platelets 80  CTA 05/24/2021 There remains extensive pulmonary embolus remains with pulmonary emboli arising from each main pulmonary artery extending into multiple lower lobe branches on each side and into the proximal right upper lobe branch on the right. Slightly less thrombus noted along the anterior aspect of the distal main pulmonary artery on the right and slightly less embolus in the proximal left lower lobe pulmonary artery compared to recent prior study. No new foci of pulmonary embolus. Stable cardiac silhouette with right ventricle to left ventricle diameter ratio of 0.86, near threshold for right heart strain.   Aortic atherosclerosis. Foci great vessel and coronary artery calcification.   Focal pneumonia left lower lobe. Areas of mild scarring and atelectasis  as noted. No pleural effusions.   No evident adenopathy.  Resolved Hospital Problem list       Assessment & Plan:  Acute PE in LLL at least; other clots may also be acute or may be more chronic. He has an undiagnosed hypercoagulability disorder to have recurrent clots despite prophylaxis. PESI score II, a/w 1.7-3.5% risk of death in the next 30 days. IVC not with significant clot burden, IVC filter appears patent. He looks clinically improved, he is hemodynamically stable Plan  -agree with heparin for now; per hematology note will transition to warfarin as OP. Heparin to continue until INR is therapeutic > 3 -Concern that he will not be able to break down these clots due to hypercoagulability disorder and he would therefore be at higher risk for CTEPH.  - Discussed with Hematology and IR by Dr. Carlis Abbott.   With thrombocytopenia catheter directed lytics seems more dangerous than  doing embolectomy, but his clot location may not be as amenable to embolectomy.  -Discussed with IR, who recommends AC for 3-5 days, then repeat CTA to determine if he has significant residual clot-- CTA was repeated this am and results show continued clot burden, within  each main pulmonary artery extending into multiple lower lobe branches on each side and into the proximal right upper lobe branch on the right. Slightly less thrombus noted along the anterior aspect of the distal main pulmonary artery on the right and slightly less embolus in the proximal left lower lobe pulmonary artery compared to recent prior study.  No new clot burden. RV/LV ratio remains  0.86.  - Consideration for need for embolectomy after reviewing repeat CTA, but with clinical improvement doubt will need further tx/ procedure - Needs outpatient hypercoagulability testing when appropriate  - needs lifelong [facility] - needs age appropriate cancer screening as an outpatient   Thrombocytopenia- chronic. Hematology suspects autoimmune  thrombocytopenia. -monitor for bleeding -no current indication for transfusion  Focal Pneumonia per CTA No fever No Leukocytosis Asymptomatic Plan Per Primary Team as they feel is clinically indicated Mobilize OOB   Best practice (right click and "Reselect all SmartList Selections" daily)  Per primary  Labs   CBC: Recent Labs  Lab 05/19/21 2059 05/21/21 0222 05/22/21 0306 05/23/21 0139  WBC 6.9 6.2 4.8 4.2  NEUTROABS  --  4.0 2.4  --   HGB 15.0 13.7 13.9 13.9  HCT 47.6 42.8 43.6 42.6  MCV 101.9* 99.5 100.5* 98.8  PLT 80* 74* 79* 80*    Basic Metabolic Panel: Recent Labs  Lab 05/19/21 2059 05/21/21 0222 05/22/21 0306  NA 139 139 137  K 4.4 3.9 4.2  CL 105 106 104  CO2 25 23 23   GLUCOSE 143* 122* 120*  BUN 22* 23* 24*  CREATININE 1.13 1.04 1.01  CALCIUM 9.3 8.6* 8.6*    Magdalen Spatz, AGACNP-BC  05/24/21 8:20 AM [facility]

## 2021-05-24 NOTE — [facility] Notes (Signed)
PROGRESS NOTE    Travis Rollins  BWG:[id number] DOB: 12/11/1965 DOA: 05/19/2021 PCP: Boyce Medici, FNP    Brief Narrative:  55 year old gentleman history of recurrent PEs was on Coumadin, Xarelto, Pradaxa, and currently on apixaban presented to the ED with left lower back pain and shortness of breath which has been worsening x5 days.  Patient status post recent COVID-19 booster 05/10/2021.  CT angiogram chest performed with extensive bilateral PE with right ventricular strain.  CT renal stone protocol with hepatic steatosis otherwise unremarkable.  Lower extremity Dopplers done with age  indeterminate DVT of right gastrocnemius vein.  2D echo ordered and pending.  Hematology consulted patient seen in consultation by Dr. Marin Olp who is recommending evaluation by IR or vascular surgery for evaluation for thrombolytic therapy.  Also concern for IVC filter being clogged with clots.  Anticoagulation panel pending.  PCCM and IR consulted for further evaluation and management.  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 -CT angiogram done with extensive bilateral PE with right ventricular strain. -2D echo pending. -Lower extremity Dopplers with age-indeterminate right gastrocnemius vein DVT. -Hypercoagulable in progress -PCCM, Hematology, IR following.  -Recommendations for  transtion to coumadin. Plan lovenox bridge with goal INR of at least 3.0 prior to d/c -Given clinical improvement, no role for more invasive intervention per PCCM -PCCM recommends repeat TTE in 3 months  2.  Chronic thrombocytopenia -Patient with no overt bleeding. -Statin and allopurinol on hold. -cont to follow CBC trends  3.  Diabetes mellitus type 2 -Hemoglobin A1c 5.9 (03/12/2016) -Repeat hemoglobin A1c of 7.6 -Continue CBG before meals and at bedtime -On Lantus dose to 13 units daily as patient to be n.p.o. after midnight.   -Continue with SSI.  4.  Coronary artery disease -Remains stable -Continue beta-blocker.  5.  Hypertension -BP remains stable -Monitor blood pressure secondary to problem #1 -Continue lisinopril, Toprol-XL.  6.  Hyperlipidemia -Statin on hold secondary to thrombocytopenia  7.  Venous insufficiency -Remains stable  8.  Weight management -Continue Topamax.    DVT prophylaxis: coumadin with lovenox bridge Code Status: Full Family Communication: Pt in room, family is currently at bedside  Status is: Inpatient  Remains inpatient appropriate because:Inpatient level of care appropriate due to severity of illness  Dispo: The patient is from: Home              Anticipated d/c is to: Home              Patient currently is not medically stable to d/c.   Difficult to place patient No       Consultants:  PCCM Hematology IR  Procedures:    Antimicrobials: Anti-infectives (From admission, onward)    None       Subjective: Reports feeling well this AM  Objective: Vitals:   05/23/21 2342 05/24/21 0312 05/24/21 1011 05/24/21 1549  BP: 117/75 123/72 127/81 100/67  Pulse: 80 77 73 71  Resp: 18 18 (!) 25 20  Temp: 97.6 F (36.4 C) 97.8 F (36.6 C) 98.2 F (36.8 C) 98.2 F (36.8 C)  TempSrc: Oral Axillary Oral Oral  SpO2: 96% 99% 98% 98%  Weight:      Height:        Intake/Output Summary (Last 24 hours) at 05/24/2021 1632 Last  data filed at 05/24/2021 1112 Gross per 24 hour  Intake --  Output 2025 ml  Net -2025 ml    Filed Weights   05/19/21 2058  Weight: 124.3 kg    Examination: General exam: Awake, sitting at edge of bed Respiratory system: Normal respiratory effort, no wheezing Cardiovascular system: regular rate, s1, s2 Gastrointestinal system: Soft, nondistended, positive BS Central nervous system: CN2-12 grossly intact, strength intact Extremities: Perfused, no clubbing Skin: Normal skin turgor, no notable skin lesions seen Psychiatry: Mood normal // no visual hallucinations   Data Reviewed: I have personally reviewed following labs and imaging studies  CBC: Recent Labs  Lab 05/19/21 2059 05/21/21 0222 05/22/21 0306 05/23/21 0139 05/24/21 0944  WBC 6.9 6.2 4.8 4.2 3.4*  NEUTROABS  --  4.0 2.4  --   --   HGB 15.0 13.7 13.9 13.9 14.6  HCT 47.6 42.8 43.6 42.6 46.0  MCV 101.9* 99.5 100.5* 98.8 99.4  PLT 80* 74* 79* 80* 93*    Basic Metabolic Panel: Recent Labs  Lab 05/19/21 2059 05/21/21 0222 05/22/21 0306  NA 139 139 137  K 4.4 3.9 4.2  CL 105 106 104  CO2 25 23 23   GLUCOSE 143* 122* 120*  BUN 22* 23* 24*  CREATININE 1.13 1.04 1.01  CALCIUM 9.3 8.6* 8.6*    GFR: Estimated Creatinine Clearance: 107 mL/min (by C-G formula based on SCr of 1.01 mg/dL). Liver Function Tests: Recent Labs  Lab 05/21/21 0222  AST 16  ALT 17  ALKPHOS 58  BILITOT 0.9  PROT 7.3  ALBUMIN 3.1*    No results for input(s): LIPASE, AMYLASE in the last 168 hours. No results for input(s): AMMONIA in the last 168 hours. Coagulation Profile: Recent Labs  Lab 05/20/21 1300  INR 1.2    Cardiac Enzymes: No results for input(s): CKTOTAL, CKMB, CKMBINDEX, TROPONINI in the last 168 hours. BNP (last 3 results) No results for input(s): PROBNP in the last 8760 hours. HbA1C: No results for input(s): HGBA1C in the last 72 hours.  CBG: Recent Labs  Lab 05/23/21 1635 05/23/21 2123 05/24/21 0840  05/24/21 1138 05/24/21 1548  GLUCAP 120* 114* 176* 101* 107*    Lipid Profile: No results for input(s): CHOL, HDL, LDLCALC, TRIG, CHOLHDL, LDLDIRECT in the last 72 hours. Thyroid Function Tests: No results for input(s): TSH, T4TOTAL, FREET4, T3FREE, THYROIDAB in the last 72 hours. Anemia Panel: No results for input(s): VITAMINB12, FOLATE, FERRITIN, TIBC, IRON, RETICCTPCT in the last 72 hours. Sepsis Labs: No results for input(s): PROCALCITON, LATICACIDVEN in the last 168 hours.  Recent Results (from the past 240 hour(s))  Resp Panel by RT-PCR (Flu A&B, Covid) Nasopharyngeal Swab     Status: None   Collection Time: 05/20/21  1:08 PM   Specimen: Nasopharyngeal Swab; Nasopharyngeal(NP) swabs in vial transport medium  Result Value Ref Range Status   SARS Coronavirus 2 by RT PCR NEGATIVE NEGATIVE Final    Comment: (NOTE) SARS-CoV-2 target nucleic acids are NOT DETECTED.  The SARS-CoV-2 RNA is generally detectable in upper respiratory specimens during the acute phase of infection. The lowest concentration of SARS-CoV-2 viral copies this assay can detect  is 138 copies/mL. A negative result does not preclude SARS-Cov-2 infection and should not be used as the sole basis for treatment or other patient management decisions. A negative result may occur with  improper specimen collection/handling, submission of specimen other than nasopharyngeal swab, presence of viral mutation(s) within the areas targeted by this assay, and inadequate number of viral copies(<138 copies/mL). A negative result must be combined with clinical observations, patient history, and epidemiological information. The expected result is Negative.  Fact Sheet for Patients:  EntrepreneurPulse.com.au  Fact Sheet for Healthcare Providers:  IncredibleEmployment.be  This test is no t yet approved or cleared by the [place] FDA and  has been authorized for detection and/or  diagnosis of SARS-CoV-2 by FDA under an Emergency Use Authorization (EUA). This EUA will remain  in effect (meaning this test can be used) for the duration of the COVID-19 declaration under Section 564(b)(1) of the Act, 21 U.S.C.section 360bbb-3(b)(1), unless the authorization is terminated  or revoked sooner.       Influenza A by PCR NEGATIVE NEGATIVE Final   Influenza B by PCR NEGATIVE NEGATIVE Final    Comment: (NOTE) The Xpert Xpress SARS-CoV-2/FLU/RSV plus assay is intended as an aid in the diagnosis of influenza from Nasopharyngeal swab specimens and should not be used as a sole basis for treatment. Nasal washings and aspirates are unacceptable for Xpert Xpress SARS-CoV-2/FLU/RSV testing.  Fact Sheet for Patients: EntrepreneurPulse.com.au  Fact Sheet for Healthcare Providers: IncredibleEmployment.be  This test is not yet approved or cleared by the [place] FDA and has been authorized for detection and/or diagnosis of SARS-CoV-2 by FDA under an Emergency Use Authorization (EUA). This EUA will remain in effect (meaning this test can be used) for the duration of the COVID-19 declaration under Section 564(b)(1) of the Act, 21 U.S.C. section 360bbb-3(b)(1), unless the authorization is terminated or revoked.  Performed at [facility], [place] [address], [place], [place] [address]       Radiology Studies: CT Angio Chest Pulmonary Embolism (PE) W or WO Contrast  Result Date: 05/24/2021 CLINICAL DATA:  Recent pulmonary embolus with recent anticoagulants treatment. Shortness of breath. EXAM: CT ANGIOGRAPHY CHEST WITH CONTRAST TECHNIQUE: Multidetector CT imaging of the chest was performed using the standard protocol during bolus administration of intravenous contrast. Multiplanar CT image reconstructions and MIPs were obtained to evaluate the vascular anatomy. CONTRAST:  63mL OMNIPAQUE IOHEXOL 350 MG/ML SOLN COMPARISON:  May 20, 2021 CT  angiogram chest FINDINGS: Cardiovascular: In comparison with most recent study, there remains pulmonary embolus on the right arising from the distal right main pulmonary artery with extension into several lower lobe pulmonary artery branches. There is pulmonary embolus at the origin of the right upper lobe pulmonary artery. There is slightly less pulmonary embolus in the distal main pulmonary artery on the right anteriorly compared to most recent study. On the left, pulmonary embolus is again noted to arise from the distal main pulmonary artery with extension of pulmonary embolus in the several lower lobe pulmonary arteries, similar in appearance overall with marginally less pulmonary embolus per at the origin of the left lower lobe pulmonary artery compared to most recent study. No new foci of pulmonary embolus evident on either side. Right ventricle to left ventricle diameter ratio remains is 0.86, approaching threshold for right heart strain. Cardiac appearance is stable compared to recent prior study. No thoracic aortic aneurysm or dissection. Visualized great vessels appear unremarkable except for slight calcification at the origin of the left subclavian  artery. Note that the right innominate and left common carotid arteries arise as a common trunk, an anatomic variant. There is aortic atherosclerosis. There are foci of coronary artery calcification. No pericardial effusion or pericardial thickening. Mediastinum/Nodes: Thyroid appears unremarkable. No evident thoracic adenopathy. No esophageal lesions are evident. Lungs/Pleura: There is stable scarring in the anterior aspect of the left upper lobe. There is airspace consolidation in a portion of the left lower lobe. There is bibasilar atelectasis as well. No appreciable pleural effusions. No pneumothorax. Trachea and major bronchial structures appear patent. Upper Abdomen: Visualized upper abdominal structures appear unremarkable. Musculoskeletal: There is  degenerative change in the thoracic spine. No blastic or lytic bone lesions. No chest wall lesions. Review of the MIP images confirms the above findings. IMPRESSION: 1. There remains extensive pulmonary embolus remains with pulmonary emboli arising from each main pulmonary artery extending into multiple lower lobe branches on each side and into the proximal right upper lobe branch on the right. Slightly less thrombus noted along the anterior aspect of the distal main pulmonary artery on the right and slightly less embolus in the proximal left lower lobe pulmonary artery compared to recent prior study. No new foci of pulmonary embolus. Stable cardiac silhouette with right ventricle to left ventricle diameter ratio of 0.86, near threshold for right heart strain. 2. Aortic atherosclerosis. Foci great vessel and coronary artery calcification. 3. Focal pneumonia left lower lobe. Areas of mild scarring and atelectasis as noted. No pleural effusions. 4.  No evident adenopathy. Aortic Atherosclerosis (ICD10-I70.0). These results will be called to the ordering clinician or representative by the Radiologist Assistant, and communication documented in the PACS or [employer]. Electronically Signed   By: Lowella Grip III M.D.   On: 05/24/2021 08:29    Scheduled Meds:  [START ON 05/25/2021] enoxaparin (LOVENOX) injection  120 mg Subcutaneous BID   furosemide  40 mg Oral q morning   insulin aspart  0-20 Units Subcutaneous TID WC   insulin glargine  13 Units Subcutaneous QHS   lidocaine  1 patch Transdermal Q24H   lisinopril  2.5 mg Oral Daily   metoprolol succinate  50 mg Oral QHS   polycarbophil  625 mg Oral Daily   potassium chloride  10 mEq Oral QHS   sodium chloride flush  3 mL Intravenous Q12H   tamsulosin  0.4 mg Oral QPC breakfast   topiramate  50 mg Oral BID   warfarin  10 mg Oral ONCE-1600   Warfarin - Pharmacist Dosing Inpatient   Does not apply q1600   Continuous Infusions:  sodium chloride        LOS: 4 days   Marylu Lund, MD [facility] Pager On Amion  If 7PM-7AM, please contact night-coverage 05/24/2021, 4:32 PM

## 2021-05-25 DIAGNOSIS — I10 Essential (primary) hypertension: Secondary | ICD-10-CM | POA: Diagnosis not present

## 2021-05-25 DIAGNOSIS — I2510 Atherosclerotic heart disease of native coronary artery without angina pectoris: Secondary | ICD-10-CM | POA: Diagnosis not present

## 2021-05-25 DIAGNOSIS — I2699 Other pulmonary embolism without acute cor pulmonale: Secondary | ICD-10-CM | POA: Diagnosis not present

## 2021-05-25 LAB — GLUCOSE, CAPILLARY
Glucose-Capillary: 97 mg/dL (ref 70–99)
Glucose-Capillary: 93 mg/dL (ref 70–99)
Glucose-Capillary: 92 mg/dL (ref 70–99)
Glucose-Capillary: 209 mg/dL — ABNORMAL HIGH (ref 70–99)

## 2021-05-25 LAB — CBC
HCT: 43.4 % (ref 39.0–52.0)
Hemoglobin: 13.9 g/dL (ref 13.0–17.0)
MCH: 31.6 pg (ref 26.0–34.0)
MCHC: 32 g/dL (ref 30.0–36.0)
MCV: 98.6 fL (ref 80.0–100.0)
Platelets: 92 10*3/uL — ABNORMAL LOW (ref 150–400)
RBC: 4.4 MIL/uL (ref 4.22–5.81)
RDW: 14.4 % (ref 11.5–15.5)
WBC: 3.2 10*3/uL — ABNORMAL LOW (ref 4.0–10.5)
nRBC: 0 % (ref 0.0–0.2)

## 2021-05-25 LAB — PROTIME-INR
INR: 1.1 (ref 0.8–1.2)
Prothrombin Time: 14.1 seconds (ref 11.4–15.2)

## 2021-05-25 MED ORDER — WARFARIN SODIUM 5 MG PO TABS
10.0000 mg | ORAL_TABLET | Freq: Once | ORAL | Status: AC
  Administered 2021-05-25: 10 mg via ORAL
  Filled 2021-05-25: qty 2

## 2021-05-25 NOTE — Progress Notes (Signed)
PROGRESS NOTE    Travis Rollins  GGY:[id number] DOB: 08/26/1966 DOA: 05/19/2021 PCP: Boyce Medici, FNP    Brief Narrative:  55 year old gentleman history of recurrent PEs was on Coumadin, Xarelto, Pradaxa, and currently on apixaban presented to the ED with left lower back pain and shortness of breath which has been worsening x5 days.  Patient status post recent COVID-19 booster 05/10/2021.  CT angiogram chest performed with extensive bilateral PE with right ventricular strain.  CT renal stone protocol with hepatic steatosis otherwise unremarkable.  Lower extremity Dopplers done with age  indeterminate DVT of right gastrocnemius vein.  2D echo ordered and pending.  Hematology consulted patient seen in consultation by Dr. Marin Olp who is recommending evaluation by IR or vascular surgery for evaluation for thrombolytic therapy.  Also concern for IVC filter being clogged with clots.  Anticoagulation panel pending.  PCCM and IR consulted for further evaluation and management.  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 -CT angiogram done with extensive bilateral PE with right ventricular strain. -2D echo pending. -Lower extremity Dopplers with age-indeterminate right gastrocnemius vein DVT. -Hypercoagulable in progress -PCCM, Hematology, IR following.  -Recommendations for  transtion to coumadin. Plan lovenox bridge with goal INR of at least 3.0 prior to d/c -Given clinical improvement, no role for more invasive intervention per PCCM -PCCM recommends repeat TTE in 3 months -INR today 1.1  2.  Chronic thrombocytopenia -Patient with no overt bleeding. -Statin and allopurinol on hold. -cont to follow CBC trends  3.  Diabetes mellitus type 2 -Hemoglobin A1c 5.9 (03/12/2016) -Repeat hemoglobin A1c of 7.6 -Continue CBG before meals and at bedtime -On Lantus dose to 13 units daily as patient to be n.p.o. after midnight.   -Continue with SSI.  4.  Coronary artery disease -Remains stable -Continue beta-blocker.  5.  Hypertension -BP remains stable -Monitor blood pressure secondary to problem #1 -Continue lisinopril, Toprol-XL.  6.  Hyperlipidemia -Statin on hold secondary to thrombocytopenia  7.  Venous insufficiency -Remains stable  8.  Weight management -Continue Topamax.    DVT prophylaxis: coumadin with lovenox bridge Code Status: Full Family Communication: Pt in room, family is currently at bedside  Status is: Inpatient  Remains inpatient appropriate because:Inpatient level of care appropriate due to severity of illness  Dispo: The patient is from: Home              Anticipated d/c is to: Home              Patient currently is not medically stable to d/c.   Difficult to place patient No   Consultants:  PCCM Hematology IR  Procedures:    Antimicrobials: Anti-infectives (From admission, onward)    None       Subjective: Without complaints  Objective: Vitals:   05/25/21 0355 05/25/21 0753 05/25/21 1135 05/25/21 1635  BP: 93/62 109/63 104/60 114/76  Pulse:  75 70 70  Resp:  19  19 18  Temp: 98 F (36.7 C) 98.2 F (36.8 C) 97.8 F (36.6 C) 97.8 F (36.6 C)  TempSrc: Oral Oral Oral Oral  SpO2:  99% 98% 95%  Weight:      Height:        Intake/Output Summary (Last 24 hours) at 05/25/2021 1717 Last data filed at  05/25/2021 0756 Gross per 24 hour  Intake --  Output 500 ml  Net -500 ml    Filed Weights   05/19/21 2058  Weight: 124.3 kg    Examination: General exam: Awake, laying in bed, in nad Respiratory system: Normal respiratory effort, no wheezing Cardiovascular system: regular rate, s1, s2 Gastrointestinal system: Soft, nondistended, positive BS Central nervous system: CN2-12 grossly intact, strength intact Extremities: Perfused, no clubbing Skin: Normal skin turgor, no notable skin lesions seen Psychiatry: Mood normal // no visual hallucinations   Data Reviewed: I have personally reviewed following labs and imaging studies  CBC: Recent Labs  Lab 05/21/21 0222 05/22/21 0306 05/23/21 0139 05/24/21 0944 05/25/21 0607  WBC 6.2 4.8 4.2 3.4* 3.2*  NEUTROABS 4.0 2.4  --   --   --   HGB 13.7 13.9 13.9 14.6 13.9  HCT 42.8 43.6 42.6 46.0 43.4  MCV 99.5 100.5* 98.8 99.4 98.6  PLT 74* 79* 80* 93* 92*    Basic Metabolic Panel: Recent Labs  Lab 05/19/21 2059 05/21/21 0222 05/22/21 0306  NA 139 139 137  K 4.4 3.9 4.2  CL 105 106 104  CO2 25 23 23   GLUCOSE 143* 122* 120*  BUN 22* 23* 24*  CREATININE 1.13 1.04 1.01  CALCIUM 9.3 8.6* 8.6*    GFR: Estimated Creatinine Clearance: 107 mL/min (by C-G formula based on SCr of 1.01 mg/dL). Liver Function Tests: Recent Labs  Lab 05/21/21 0222  AST 16  ALT 17  ALKPHOS 58  BILITOT 0.9  PROT 7.3  ALBUMIN 3.1*    No results for input(s): LIPASE, AMYLASE in the last 168 hours. No results for input(s): AMMONIA in the last 168 hours. Coagulation Profile: Recent Labs  Lab 05/20/21 1300 05/25/21 0607  INR 1.2 1.1    Cardiac Enzymes: No results for input(s): CKTOTAL, CKMB, CKMBINDEX, TROPONINI in the last 168 hours. BNP (last 3 results) No results for input(s): PROBNP in the last 8760 hours. HbA1C: No results for input(s): HGBA1C in the last 72 hours.  CBG: Recent Labs  Lab 05/24/21 1548 05/24/21 2111 05/25/21 0752  05/25/21 1128 05/25/21 1634  GLUCAP 107* 107* 97 93 92    Lipid Profile: No results for input(s): CHOL, HDL, LDLCALC, TRIG, CHOLHDL, LDLDIRECT in the last 72 hours. Thyroid Function Tests: No results for input(s): TSH, T4TOTAL, FREET4, T3FREE, THYROIDAB in the last 72 hours. Anemia Panel: No results for input(s): VITAMINB12, FOLATE, FERRITIN, TIBC, IRON, RETICCTPCT in the last 72 hours. Sepsis Labs: No results for input(s): PROCALCITON, LATICACIDVEN in the last 168 hours.  Recent Results (from the past 240 hour(s))  Resp Panel by RT-PCR (Flu A&B, Covid) Nasopharyngeal Swab     Status: None   Collection Time: 05/20/21  1:08 PM   Specimen: Nasopharyngeal Swab; Nasopharyngeal(NP) swabs in vial transport medium  Result Value Ref Range Status   SARS Coronavirus 2 by RT PCR NEGATIVE NEGATIVE Final    Comment: (NOTE) SARS-CoV-2 target nucleic acids are NOT DETECTED.  The SARS-CoV-2 RNA is generally detectable in upper respiratory specimens during the acute phase of infection. The lowest concentration of SARS-CoV-2 viral copies this assay can detect is  138 copies/mL. A negative result does not preclude SARS-Cov-2 infection and should not be used as the sole basis for treatment or other patient management decisions. A negative result may occur with  improper specimen collection/handling, submission of specimen other than nasopharyngeal swab, presence of viral mutation(s) within the areas targeted by this assay, and inadequate number of viral copies(<138 copies/mL). A negative result must be combined with clinical observations, patient history, and epidemiological information. The expected result is Negative.  Fact Sheet for Patients:  EntrepreneurPulse.com.au  Fact Sheet for Healthcare Providers:  IncredibleEmployment.be  This test is no t yet approved or cleared by the [place] FDA and  has been authorized for detection and/or diagnosis of  SARS-CoV-2 by FDA under an Emergency Use Authorization (EUA). This EUA will remain  in effect (meaning this test can be used) for the duration of the COVID-19 declaration under Section 564(b)(1) of the Act, 21 U.S.C.section 360bbb-3(b)(1), unless the authorization is terminated  or revoked sooner.       Influenza A by PCR NEGATIVE NEGATIVE Final   Influenza B by PCR NEGATIVE NEGATIVE Final    Comment: (NOTE) The Xpert Xpress SARS-CoV-2/FLU/RSV plus assay is intended as an aid in the diagnosis of influenza from Nasopharyngeal swab specimens and should not be used as a sole basis for treatment. Nasal washings and aspirates are unacceptable for Xpert Xpress SARS-CoV-2/FLU/RSV testing.  Fact Sheet for Patients: EntrepreneurPulse.com.au  Fact Sheet for Healthcare Providers: IncredibleEmployment.be  This test is not yet approved or cleared by the [place] FDA and has been authorized for detection and/or diagnosis of SARS-CoV-2 by FDA under an Emergency Use Authorization (EUA). This EUA will remain in effect (meaning this test can be used) for the duration of the COVID-19 declaration under Section 564(b)(1) of the Act, 21 U.S.C. section 360bbb-3(b)(1), unless the authorization is terminated or revoked.  Performed at [facility], [place] [address], [place],  [address]       Radiology Studies: CT Angio Chest Pulmonary Embolism (PE) W or WO Contrast  Result Date: 05/24/2021 CLINICAL DATA:  Recent pulmonary embolus with recent anticoagulants treatment. Shortness of breath. EXAM: CT ANGIOGRAPHY CHEST WITH CONTRAST TECHNIQUE: Multidetector CT imaging of the chest was performed using the standard protocol during bolus administration of intravenous contrast. Multiplanar CT image reconstructions and MIPs were obtained to evaluate the vascular anatomy. CONTRAST:  12mL OMNIPAQUE IOHEXOL 350 MG/ML SOLN COMPARISON:  May 20, 2021 CT angiogram  chest FINDINGS: Cardiovascular: In comparison with most recent study, there remains pulmonary embolus on the right arising from the distal right main pulmonary artery with extension into several lower lobe pulmonary artery branches. There is pulmonary embolus at the origin of the right upper lobe pulmonary artery. There is slightly less pulmonary embolus in the distal main pulmonary artery on the right anteriorly compared to most recent study. On the left, pulmonary embolus is again noted to arise from the distal main pulmonary artery with extension of pulmonary embolus in the several lower lobe pulmonary arteries, similar in appearance overall with marginally less pulmonary embolus per at the origin of the left lower lobe pulmonary artery compared to most recent study. No new foci of pulmonary embolus evident on either side. Right ventricle to left ventricle diameter ratio remains is 0.86, approaching threshold for right heart strain. Cardiac appearance is stable compared to recent prior study. No thoracic aortic aneurysm or dissection. Visualized great vessels appear unremarkable except for slight calcification at the origin of the left subclavian artery.  Note that the right innominate and left common carotid arteries arise as a common trunk, an anatomic variant. There is aortic atherosclerosis. There are foci of coronary artery calcification. No pericardial effusion or pericardial thickening. Mediastinum/Nodes: Thyroid appears unremarkable. No evident thoracic adenopathy. No esophageal lesions are evident. Lungs/Pleura: There is stable scarring in the anterior aspect of the left upper lobe. There is airspace consolidation in a portion of the left lower lobe. There is bibasilar atelectasis as well. No appreciable pleural effusions. No pneumothorax. Trachea and major bronchial structures appear patent. Upper Abdomen: Visualized upper abdominal structures appear unremarkable. Musculoskeletal: There is degenerative  change in the thoracic spine. No blastic or lytic bone lesions. No chest wall lesions. Review of the MIP images confirms the above findings. IMPRESSION: 1. There remains extensive pulmonary embolus remains with pulmonary emboli arising from each main pulmonary artery extending into multiple lower lobe branches on each side and into the proximal right upper lobe branch on the right. Slightly less thrombus noted along the anterior aspect of the distal main pulmonary artery on the right and slightly less embolus in the proximal left lower lobe pulmonary artery compared to recent prior study. No new foci of pulmonary embolus. Stable cardiac silhouette with right ventricle to left ventricle diameter ratio of 0.86, near threshold for right heart strain. 2. Aortic atherosclerosis. Foci great vessel and coronary artery calcification. 3. Focal pneumonia left lower lobe. Areas of mild scarring and atelectasis as noted. No pleural effusions. 4.  No evident adenopathy. Aortic Atherosclerosis (ICD10-I70.0). These results will be called to the ordering clinician or representative by the Radiologist Assistant, and communication documented in the PACS or [employer]. Electronically Signed   By: Lowella Grip III M.D.   On: 05/24/2021 08:29    Scheduled Meds:  enoxaparin (LOVENOX) injection  120 mg Subcutaneous BID   furosemide  40 mg Oral q morning   insulin aspart  0-20 Units Subcutaneous TID WC   insulin glargine  13 Units Subcutaneous QHS   lidocaine  1 patch Transdermal Q24H   lisinopril  2.5 mg Oral Daily   metoprolol succinate  50 mg Oral QHS   polycarbophil  625 mg Oral Daily   potassium chloride  10 mEq Oral QHS   sodium chloride flush  3 mL Intravenous Q12H   tamsulosin  0.4 mg Oral QPC breakfast   topiramate  50 mg Oral BID   warfarin  10 mg Oral ONCE-1600   Warfarin - Pharmacist Dosing Inpatient   Does not apply q1600   Continuous Infusions:  sodium chloride       LOS: 5 days   Marylu Lund, MD [facility] Pager On Amion  If 7PM-7AM, please contact night-coverage 05/25/2021, 5:17 PM

## 2021-05-25 NOTE — Progress Notes (Signed)
ANTICOAGULATION CONSULT NOTE - Follow Up Consult  Pharmacy Consult for IV Heparin>>Lovenox/coumadin Indication: pulmonary embolus  Patient Measurements: Height: 5\' 7"  (170.2 cm) Weight: 124.3 kg (274 lb) IBW/kg (Calculated) : 66.1 Heparin Dosing Weight: 95.1 kg  Vital Signs: Temp: 98.2 F (36.8 C) (06/18 0753) Temp Source: Oral (06/18 0753) BP: 109/63 (06/18 0753) Pulse Rate: 75 (06/18 0753)  Labs: Recent Labs    05/23/21 0139 05/23/21 1308 05/23/21 2123 05/24/21 0944 05/25/21 0607  HGB 13.9  --   --  14.6 13.9  HCT 42.6  --   --  46.0 43.4  PLT 80*  --   --  93* 92*  APTT 56* 108* 124* 140*  --   LABPROT  --   --   --   --  14.1  INR  --   --   --   --  1.1  HEPARINUNFRC 0.37 >1.10* >1.10* >1.10*  --      Estimated Creatinine Clearance: 107 mL/min (by C-G formula based on SCr of 1.01 mg/dL).   Medical History: Past Medical History:  Diagnosis Date   Anemia    Carpal tunnel syndrome    Deep vein thrombosis ([place]) 07/2013, 10/2014, 03/16/2015   LLE (studies at [facility])   Depression    Diabetes mellitus    Hyperlipidemia    Hypertension    Obesity    Osteoarthritis    Peripheral vascular disease ([place])    6 yrs ago, DVT in Lt knee/ groin   Pulmonary embolism (HCC)    hx of x 3 per patient; most recent 02/2014   Sinusitis    Sleep apnea     Assessment: 55 yr old man presented to ED with increased work of breathing, pain on deep breaths, D-dimer >20 and CT chest positive for extensive bilateral pulmonary embolism with R heart strain. Pharmacy was consulted to dose IV heparin. Pt was on apixaban PTA for hx of PE/DVT (last dose on 6/12 at 0900 AM). Of note, pt had IVC filter placed in 2015 at time of last PE. Patient transitioned to SQ lovenox to bridge to warfarin until INR therapeutic on 6/17. This AM, INR is 1.1 s/p 10 MG x1 on 6/17. INR goal per heme is higher at 3-3.5. CBC is stable this AM.   Goal of Therapy:  INR 3-3.5 Heparin level 0.3-0.7 units/ml aPTT  66-102 seconds Monitor platelets by anticoagulation protocol: Yes   Plan:  Lovenox 120mg  SQ BID Coumadin 10mg  PO x1 today Daily INR Monitor for bleeding  Cephus Slater, PharmD, MBA Pharmacy Resident [phone] 05/25/2021 8:41 AM

## 2021-05-26 DIAGNOSIS — I2699 Other pulmonary embolism without acute cor pulmonale: Secondary | ICD-10-CM | POA: Diagnosis not present

## 2021-05-26 DIAGNOSIS — I10 Essential (primary) hypertension: Secondary | ICD-10-CM | POA: Diagnosis not present

## 2021-05-26 DIAGNOSIS — I2510 Atherosclerotic heart disease of native coronary artery without angina pectoris: Secondary | ICD-10-CM | POA: Diagnosis not present

## 2021-05-26 LAB — CBC
HCT: 44.4 % (ref 39.0–52.0)
Hemoglobin: 14.4 g/dL (ref 13.0–17.0)
MCH: 31.7 pg (ref 26.0–34.0)
MCHC: 32.4 g/dL (ref 30.0–36.0)
MCV: 97.8 fL (ref 80.0–100.0)
Platelets: 97 10*3/uL — ABNORMAL LOW (ref 150–400)
RBC: 4.54 MIL/uL (ref 4.22–5.81)
RDW: 14.1 % (ref 11.5–15.5)
WBC: 4 10*3/uL (ref 4.0–10.5)
nRBC: 0 % (ref 0.0–0.2)

## 2021-05-26 LAB — GLUCOSE, CAPILLARY
Glucose-Capillary: 178 mg/dL — ABNORMAL HIGH (ref 70–99)
Glucose-Capillary: 110 mg/dL — ABNORMAL HIGH (ref 70–99)
Glucose-Capillary: 117 mg/dL — ABNORMAL HIGH (ref 70–99)
Glucose-Capillary: 117 mg/dL — ABNORMAL HIGH (ref 70–99)

## 2021-05-26 LAB — PROTIME-INR
INR: 1.3 — ABNORMAL HIGH (ref 0.8–1.2)
Prothrombin Time: 15.8 seconds — ABNORMAL HIGH (ref 11.4–15.2)

## 2021-05-26 MED ORDER — WARFARIN SODIUM 5 MG PO TABS
10.0000 mg | ORAL_TABLET | Freq: Once | ORAL | Status: AC
  Administered 2021-05-26: 10 mg via ORAL
  Filled 2021-05-26: qty 2

## 2021-05-26 NOTE — Progress Notes (Signed)
ANTICOAGULATION CONSULT NOTE - Follow Up Consult  Pharmacy Consult for IV Heparin>>Lovenox/coumadin Indication: pulmonary embolus  Patient Measurements: Height: 5\' 7"  (170.2 cm) Weight: 124.3 kg (274 lb) IBW/kg (Calculated) : 66.1 Heparin Dosing Weight: 95.1 kg  Vital Signs: Temp: 98 F (36.7 C) (06/19 1242) Temp Source: Oral (06/19 1242) BP: 115/76 (06/19 1242) Pulse Rate: 84 (06/19 1242)  Labs: Recent Labs    05/23/21 1308 05/23/21 2123 05/24/21 0944 05/24/21 0944 05/25/21 0607 05/26/21 0144  HGB  --   --  14.6   < > 13.9 14.4  HCT  --   --  46.0  --  43.4 44.4  PLT  --   --  93*  --  92* 97*  APTT 108* 124* 140*  --   --   --   LABPROT  --   --   --   --  14.1 15.8*  INR  --   --   --   --  1.1 1.3*  HEPARINUNFRC >1.10* >1.10* >1.10*  --   --   --    < > = values in this interval not displayed.     Estimated Creatinine Clearance: 107 mL/min (by C-G formula based on SCr of 1.01 mg/dL).   Medical History: Past Medical History:  Diagnosis Date   Anemia    Carpal tunnel syndrome    Deep vein thrombosis ([place]) 07/2013, 10/2014, 03/16/2015   LLE (studies at [facility])   Depression    Diabetes mellitus    Hyperlipidemia    Hypertension    Obesity    Osteoarthritis    Peripheral vascular disease ([place])    6 yrs ago, DVT in Lt knee/ groin   Pulmonary embolism (HCC)    hx of x 3 per patient; most recent 02/2014   Sinusitis    Sleep apnea     Assessment: 55 yr old man presented to ED with increased work of breathing, pain on deep breaths, D-dimer >20 and CT chest positive for extensive bilateral pulmonary embolism with R heart strain. Pharmacy was consulted to dose IV heparin. Pt was on apixaban PTA for hx of PE/DVT (last dose on 6/12 at 0900 AM). Of note, pt had IVC filter placed in 2015 at time of last PE. Patient transitioned to SQ lovenox to bridge to warfarin until INR therapeutic on 6/17. This AM, INR is 1.3 s/p 10 MG x1 on 6/17 and 6/16. INR goal per heme is  higher at 3-3.5. CBC is stable this AM. No bleeding noted at this time.   Goal of Therapy:  INR 3-3.5 Heparin level 0.3-0.7 units/ml aPTT 66-102 seconds Monitor platelets by anticoagulation protocol: Yes   Plan:  Lovenox 120mg  SQ BID Coumadin 10mg  PO x1 today Daily INR Monitor for bleeding  Cephus Slater, PharmD, MBA Pharmacy Resident [phone] 05/26/2021 1:04 PM

## 2021-05-26 NOTE — Progress Notes (Signed)
PROGRESS NOTE    WASIL WOLKE  YOV:[id number] DOB: 03-22-1966 DOA: 05/19/2021 PCP: Boyce Medici, FNP    Brief Narrative:  55 year old gentleman history of recurrent PEs was on Coumadin, Xarelto, Pradaxa, and currently on apixaban presented to the ED with left lower back pain and shortness of breath which has been worsening x5 days.  Patient status post recent COVID-19 booster 05/10/2021.  CT angiogram chest performed with extensive bilateral PE with right ventricular strain.  CT renal stone protocol with hepatic steatosis otherwise unremarkable.  Lower extremity Dopplers done with age  indeterminate DVT of right gastrocnemius vein.  2D echo ordered and pending.  Hematology consulted patient seen in consultation by Dr. Marin Olp who is recommending evaluation by IR or vascular surgery for evaluation for thrombolytic therapy.  Also concern for IVC filter being clogged with clots.  Anticoagulation panel pending.  PCCM and IR consulted for further evaluation and management.  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 -CT angiogram done with extensive bilateral PE with right ventricular strain. -2D echo pending. -Lower extremity Dopplers with age-indeterminate right gastrocnemius vein DVT. -Hypercoagulable in progress -PCCM, Hematology, IR had been following.   -Recommendations for transtion to coumadin. Plan lovenox bridge with goal INR of at least 3.0 prior to d/c -Given clinical improvement, no role for more invasive intervention per PCCM -PCCM recommends repeat TTE in 3 months -INR today 1.3  2.  Chronic thrombocytopenia -Patient with no overt bleeding. -Statin and allopurinol on hold. -Recheck CBC in AM  3.  Diabetes mellitus type 2 -Hemoglobin A1c 5.9 (03/12/2016) -Repeat hemoglobin A1c of 7.6 -Continue CBG before meals and at bedtime -On Lantus dose to 13 units daily as patient to be n.p.o. after midnight.   -Continue with SSI.  4.  Coronary artery disease -Remains stable -Continue beta-blocker.  5.  Hypertension -BP remains stable -Monitor blood pressure secondary to problem #1 -Continue lisinopril, Toprol-XL.  6.  Hyperlipidemia -Statin on hold secondary to thrombocytopenia  7.  Venous insufficiency -Remains stable  8.  Weight management -Continue Topamax.    DVT prophylaxis: coumadin with lovenox bridge Code Status: Full Family Communication: Pt in room, family is currently not at bedside  Status is: Inpatient  Remains inpatient appropriate because:Inpatient level of care appropriate due to severity of illness  Dispo: The patient is from: Home              Anticipated d/c is to: Home              Patient currently is not medically stable to d/c.   Difficult to place patient No   Consultants:  PCCM Hematology IR  Procedures:    Antimicrobials: Anti-infectives (From admission, onward)    None       Subjective: No complaints today  Objective: Vitals:   05/26/21 0348 05/26/21 0753 05/26/21 0802 05/26/21 1242  BP: 101/70 115/79 94/71 115/76  Pulse: 70  80 84  Resp:  (!) 22 19 20   Temp:   98.4 F (36.9 C) 98 F (36.7 C)  TempSrc:   Oral Oral  SpO2: 93% 97% 96% 95%  Weight:      Height:        Intake/Output Summary (Last 24 hours) at 05/26/2021 1535 Last data filed at 05/26/2021  1248 Gross per 24 hour  Intake --  Output 1550 ml  Net -1550 ml    Filed Weights   05/19/21 2058  Weight: 124.3 kg    Examination: General exam: Conversant, in no acute distress Respiratory system: normal chest rise, clear, no audible wheezing Cardiovascular system: regular rhythm, s1-s2 Gastrointestinal system: Nondistended, nontender, pos BS Central nervous system: No seizures, no tremors Extremities: No cyanosis, no joint deformities Skin: No rashes, no pallor Psychiatry: Affect normal // no auditory hallucinations   Data Reviewed: I have personally reviewed following labs and imaging studies  CBC: Recent Labs  Lab 05/21/21 0222 05/22/21 0306 05/23/21 0139 05/24/21 0944 05/25/21 0607 05/26/21 0144  WBC 6.2 4.8 4.2 3.4* 3.2* 4.0  NEUTROABS 4.0 2.4  --   --   --   --   HGB 13.7 13.9 13.9 14.6 13.9 14.4  HCT 42.8 43.6 42.6 46.0 43.4 44.4  MCV 99.5 100.5* 98.8 99.4 98.6 97.8  PLT 74* 79* 80* 93* 92* 97*    Basic Metabolic Panel: Recent Labs  Lab 05/19/21 2059 05/21/21 0222 05/22/21 0306  NA 139 139 137  K 4.4 3.9 4.2  CL 105 106 104  CO2 25 23 23   GLUCOSE 143* 122* 120*  BUN 22* 23* 24*  CREATININE 1.13 1.04 1.01  CALCIUM 9.3 8.6* 8.6*    GFR: Estimated Creatinine Clearance: 107 mL/min (by C-G formula based on SCr of 1.01 mg/dL). Liver Function Tests: Recent Labs  Lab 05/21/21 0222  AST 16  ALT 17  ALKPHOS 58  BILITOT 0.9  PROT 7.3  ALBUMIN 3.1*    No results for input(s): LIPASE, AMYLASE in the last 168 hours. No results for input(s): AMMONIA in the last 168 hours. Coagulation Profile: Recent Labs  Lab 05/20/21 1300 05/25/21 0607 05/26/21 0144  INR 1.2 1.1 1.3*    Cardiac Enzymes: No results for input(s): CKTOTAL, CKMB, CKMBINDEX, TROPONINI in the last 168 hours. BNP (last 3 results) No results for input(s): PROBNP in the last 8760 hours. HbA1C: No results for input(s): HGBA1C in the last 72 hours.  CBG: Recent Labs  Lab  05/25/21 1128 05/25/21 1634 05/25/21 2107 05/26/21 0800 05/26/21 1240  GLUCAP 93 92 209* 178* 110*    Lipid Profile: No results for input(s): CHOL, HDL, LDLCALC, TRIG, CHOLHDL, LDLDIRECT in the last 72 hours. Thyroid Function Tests: No results for input(s): TSH, T4TOTAL, FREET4, T3FREE, THYROIDAB in the last 72 hours. Anemia Panel: No results for input(s): VITAMINB12, FOLATE, FERRITIN, TIBC, IRON, RETICCTPCT in the last 72 hours. Sepsis Labs: No results for input(s): PROCALCITON, LATICACIDVEN in the last 168 hours.  Recent Results (from the past 240 hour(s))  Resp Panel by RT-PCR (Flu A&B, Covid) Nasopharyngeal Swab     Status: None   Collection Time: 05/20/21  1:08 PM   Specimen: Nasopharyngeal Swab; Nasopharyngeal(NP) swabs in vial transport medium  Result Value Ref Range Status   SARS Coronavirus 2 by RT PCR NEGATIVE NEGATIVE Final    Comment: (NOTE) SARS-CoV-2 target nucleic acids are NOT DETECTED.  The SARS-CoV-2 RNA is generally detectable in upper respiratory specimens during the acute phase of infection. The lowest concentration of  SARS-CoV-2 viral copies this assay can detect is 138 copies/mL. A negative result does not preclude SARS-Cov-2 infection and should not be used as the sole basis for treatment or other patient management decisions. A negative result may occur with  improper specimen collection/handling, submission of specimen other than nasopharyngeal swab, presence of viral mutation(s) within the areas targeted by this assay, and inadequate number of viral copies(<138 copies/mL). A negative result must be combined with clinical observations, patient history, and epidemiological information. The expected result is Negative.  Fact Sheet for Patients:  EntrepreneurPulse.com.au  Fact Sheet for Healthcare Providers:  IncredibleEmployment.be  This test is no t yet approved or cleared by the [place] FDA and  has been  authorized for detection and/or diagnosis of SARS-CoV-2 by FDA under an Emergency Use Authorization (EUA). This EUA will remain  in effect (meaning this test can be used) for the duration of the COVID-19 declaration under Section 564(b)(1) of the Act, 21 U.S.C.section 360bbb-3(b)(1), unless the authorization is terminated  or revoked sooner.       Influenza A by PCR NEGATIVE NEGATIVE Final   Influenza B by PCR NEGATIVE NEGATIVE Final    Comment: (NOTE) The Xpert Xpress SARS-CoV-2/FLU/RSV plus assay is intended as an aid in the diagnosis of influenza from Nasopharyngeal swab specimens and should not be used as a sole basis for treatment. Nasal washings and aspirates are unacceptable for Xpert Xpress SARS-CoV-2/FLU/RSV testing.  Fact Sheet for Patients: EntrepreneurPulse.com.au  Fact Sheet for Healthcare Providers: IncredibleEmployment.be  This test is not yet approved or cleared by the [place] FDA and has been authorized for detection and/or diagnosis of SARS-CoV-2 by FDA under an Emergency Use Authorization (EUA). This EUA will remain in effect (meaning this test can be used) for the duration of the COVID-19 declaration under Section 564(b)(1) of the Act, 21 U.S.C. section 360bbb-3(b)(1), unless the authorization is terminated or revoked.  Performed at [facility], [place] [address], [place], [place] [address]       Radiology Studies: No results found.  Scheduled Meds:  enoxaparin (LOVENOX) injection  120 mg Subcutaneous BID   furosemide  40 mg Oral q morning   insulin aspart  0-20 Units Subcutaneous TID WC   insulin glargine  13 Units Subcutaneous QHS   lidocaine  1 patch Transdermal Q24H   lisinopril  2.5 mg Oral Daily   metoprolol succinate  50 mg Oral QHS   polycarbophil  625 mg Oral Daily   potassium chloride  10 mEq Oral QHS   sodium chloride flush  3 mL Intravenous Q12H   tamsulosin  0.4 mg Oral QPC breakfast    topiramate  50 mg Oral BID   warfarin  10 mg Oral ONCE-1600   Warfarin - Pharmacist Dosing Inpatient   Does not apply q1600   Continuous Infusions:  sodium chloride       LOS: 6 days   Marylu Lund, MD [facility] Pager On Amion  If 7PM-7AM, please contact night-coverage 05/26/2021, 3:35 PM

## 2021-05-27 DIAGNOSIS — I2510 Atherosclerotic heart disease of native coronary artery without angina pectoris: Secondary | ICD-10-CM | POA: Diagnosis not present

## 2021-05-27 DIAGNOSIS — I10 Essential (primary) hypertension: Secondary | ICD-10-CM | POA: Diagnosis not present

## 2021-05-27 DIAGNOSIS — I2699 Other pulmonary embolism without acute cor pulmonale: Secondary | ICD-10-CM | POA: Diagnosis not present

## 2021-05-27 LAB — CBC
HCT: 45.5 % (ref 39.0–52.0)
Hemoglobin: 14.6 g/dL (ref 13.0–17.0)
MCH: 31.3 pg (ref 26.0–34.0)
MCHC: 32.1 g/dL (ref 30.0–36.0)
MCV: 97.4 fL (ref 80.0–100.0)
Platelets: 107 10*3/uL — ABNORMAL LOW (ref 150–400)
RBC: 4.67 MIL/uL (ref 4.22–5.81)
RDW: 14.1 % (ref 11.5–15.5)
WBC: 3.6 10*3/uL — ABNORMAL LOW (ref 4.0–10.5)
nRBC: 0 % (ref 0.0–0.2)

## 2021-05-27 LAB — GLUCOSE, CAPILLARY
Glucose-Capillary: 151 mg/dL — ABNORMAL HIGH (ref 70–99)
Glucose-Capillary: 80 mg/dL (ref 70–99)
Glucose-Capillary: 130 mg/dL — ABNORMAL HIGH (ref 70–99)
Glucose-Capillary: 153 mg/dL — ABNORMAL HIGH (ref 70–99)

## 2021-05-27 LAB — PROTIME-INR
INR: 1.7 — ABNORMAL HIGH (ref 0.8–1.2)
Prothrombin Time: 19.6 seconds — ABNORMAL HIGH (ref 11.4–15.2)

## 2021-05-27 LAB — PROTHROMBIN GENE MUTATION

## 2021-05-27 MED ORDER — WARFARIN SODIUM 7.5 MG PO TABS
7.5000 mg | ORAL_TABLET | Freq: Once | ORAL | Status: AC
  Administered 2021-05-27: 7.5 mg via ORAL
  Filled 2021-05-27: qty 1

## 2021-05-27 NOTE — Progress Notes (Addendum)
ANTICOAGULATION CONSULT NOTE - Follow Up Consult  Pharmacy Consult for Coumadin Indication: pulmonary embolus  Allergies  Allergen Reactions   Penicillins Hives    Has patient had a PCN reaction causing immediate rash, facial/tongue/throat swelling, SOB or lightheadedness with hypotension: No Has patient had a PCN reaction causing severe rash involving mucus membranes or skin necrosis: No Has patient had a PCN reaction that required hospitalization No Has patient had a PCN reaction occurring within the last 10 years: No If all of the above answers are "NO", then may proceed with Cephalosporin use.    Patient Measurements: Height: 5\' 7"  (170.2 cm) Weight: 124.3 kg (274 lb) IBW/kg (Calculated) : 66.1  Vital Signs: Temp: 97.8 F (36.6 C) (06/20 0729) Temp Source: Oral (06/20 0729) BP: 122/76 (06/20 0729) Pulse Rate: 95 (06/20 0729)  Labs: Recent Labs    05/24/21 0944 05/25/21 0607 05/26/21 0144 05/27/21 0455  HGB 14.6 13.9 14.4 14.6  HCT 46.0 43.4 44.4 45.5  PLT 93* 92* 97* 107*  APTT 140*  --   --   --   LABPROT  --  14.1 15.8* 19.6*  INR  --  1.1 1.3* 1.7*  HEPARINUNFRC >1.10*  --   --   --     Estimated Creatinine Clearance: 107 mL/min (by C-G formula based on SCr of 1.01 mg/dL).   Assessment:  Anticoag: Low-dose Eliquis (LD 6/12) for hx of recurrent PE's. Chronic thrombocytopenia (BL 50-100). D-dimer >20; Antithrombin Act 90. Lupus anticoagulant negative. +age-indeterminat DVT. Coumadin preferred per heme>>starting 6/17. INR goal 3-3.5. Ok to change heparin to lovenox since no more procedures - CT Venogram revealing patent IVC and no significant findings - no thrombus in iliac veins - CT 6/13 and 6/17: Extensive PE remains - INR 1.3>>1.7 today. Hgb 14.6 stable. Plts 107 up slightly.  Goal of Therapy:  INR 3-3.5 Monitor platelets by anticoagulation protocol: Yes   Plan:  Lovenox 120mg  SQ BID Coumadin 7.5mg  po x 1 tonight. Daily INR   Jalexus Brett S.  Alford Highland, PharmD, BCPS Clinical Staff Pharmacist [email or web address]  Alford Highland, [employer] 05/27/2021,8:52 AM

## 2021-05-27 NOTE — Progress Notes (Signed)
PROGRESS NOTE    BRAXTEN MEMMER  KDX:[id number] DOB: 1966-02-18 DOA: 05/19/2021 PCP: Boyce Medici, FNP    Brief Narrative:  55 year old gentleman history of recurrent PEs was on Coumadin, Xarelto, Pradaxa, and currently on apixaban presented to the ED with left lower back pain and shortness of breath which has been worsening x5 days.  Patient status post recent COVID-19 booster 05/10/2021.  CT angiogram chest performed with extensive bilateral PE with right ventricular strain.  CT renal stone protocol with hepatic steatosis otherwise unremarkable.  Lower extremity Dopplers done with age  indeterminate DVT of right gastrocnemius vein.  2D echo ordered and pending.  Hematology consulted patient seen in consultation by Dr. Marin Olp who is recommending evaluation by IR or vascular surgery for evaluation for thrombolytic therapy.  Also concern for IVC filter being clogged with clots.  Anticoagulation panel pending.  PCCM and IR consulted for further evaluation and management.  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 -CT angiogram done with extensive bilateral PE with right ventricular strain. -2D echo pending. -Lower extremity Dopplers with age-indeterminate right gastrocnemius vein DVT. -Hypercoagulable in progress -PCCM, Hematology, IR had been following.   -Recommendations for transtion to coumadin. Continue lovenox bridge with goal INR of at least 3.0 prior to d/c -Given clinical improvement, no role for more invasive intervention per PCCM -PCCM recommends repeat TTE in 3 months -INR today 1.7  2.  Chronic thrombocytopenia -Patient with no overt bleeding. -Statin and allopurinol on hold. -Cont to follow CBC trends  3.  Diabetes mellitus type 2 -Hemoglobin A1c 5.9 (03/12/2016) -Repeat hemoglobin A1c of 7.6 -Continue CBG before meals and at bedtime -On Lantus dose to 13 units daily as patient to be n.p.o. after midnight.   -Continue with SSI.  4.  Coronary artery disease -Remains stable -Continue beta-blocker.  5.  Hypertension -BP remains stable -Monitor blood pressure secondary to problem #1 -Continue lisinopril, Toprol-XL.  6.  Hyperlipidemia -Statin on hold secondary to thrombocytopenia  7.  Venous insufficiency -Remains stable  8.  Weight management -Continue Topamax.    DVT prophylaxis: coumadin with lovenox bridge Code Status: Full Family Communication: Pt in room, family is currently not at bedside  Status is: Inpatient  Remains inpatient appropriate because:Inpatient level of care appropriate due to severity of illness  Dispo: The patient is from: Home              Anticipated d/c is to: Home              Patient currently is not medically stable to d/c.   Difficult to place patient No   Consultants:  PCCM Hematology IR  Procedures:    Antimicrobials: Anti-infectives (From admission, onward)    None       Subjective: Without complaints  Objective: Vitals:   05/27/21 0530 05/27/21 0729 05/27/21 0847 05/27/21 1212  BP: 102/65 122/76 (!) 119/94 107/60  Pulse: 71 95  83  Resp: 17 20 (!) 26 18  Temp: 97.6 F (36.4 C) 97.8 F (36.6 C)  (!) 97.4 F (36.3 C)  TempSrc: Oral Oral    SpO2: 94% 95%  100%  Weight:      Height:        Intake/Output Summary (Last 24 hours) at 05/27/2021  1503 Last data filed at 05/27/2021 0700 Gross per 24 hour  Intake 240 ml  Output 1125 ml  Net -885 ml    Filed Weights   05/19/21 2058  Weight: 124.3 kg    Examination: General exam: Awake, laying in bed, in nad Respiratory system: Normal respiratory effort, no wheezing Cardiovascular system: regular rate, s1, s2 Gastrointestinal system: Soft, nondistended, positive BS Central nervous system: CN2-12 grossly intact, strength intact Extremities: Perfused, no clubbing Skin: Normal skin turgor, no notable skin lesions seen Psychiatry: Mood normal // no visual hallucinations   Data Reviewed: I have personally reviewed following labs and imaging studies  CBC: Recent Labs  Lab 05/21/21 0222 05/22/21 0306 05/23/21 0139 05/24/21 0944 05/25/21 0607 05/26/21 0144 05/27/21 0455  WBC 6.2 4.8 4.2 3.4* 3.2* 4.0 3.6*  NEUTROABS 4.0 2.4  --   --   --   --   --   HGB 13.7 13.9 13.9 14.6 13.9 14.4 14.6  HCT 42.8 43.6 42.6 46.0 43.4 44.4 45.5  MCV 99.5 100.5* 98.8 99.4 98.6 97.8 97.4  PLT 74* 79* 80* 93* 92* 97* 107*    Basic Metabolic Panel: Recent Labs  Lab 05/21/21 0222 05/22/21 0306  NA 139 137  K 3.9 4.2  CL 106 104  CO2 23 23  GLUCOSE 122* 120*  BUN 23* 24*  CREATININE 1.04 1.01  CALCIUM 8.6* 8.6*    GFR: Estimated Creatinine Clearance: 107 mL/min (by C-G formula based on SCr of 1.01 mg/dL). Liver Function Tests: Recent Labs  Lab 05/21/21 0222  AST 16  ALT 17  ALKPHOS 58  BILITOT 0.9  PROT 7.3  ALBUMIN 3.1*    No results for input(s): LIPASE, AMYLASE in the last 168 hours. No results for input(s): AMMONIA in the last 168 hours. Coagulation Profile: Recent Labs  Lab 05/25/21 0607 05/26/21 0144 05/27/21 0455  INR 1.1 1.3* 1.7*    Cardiac Enzymes: No results for input(s): CKTOTAL, CKMB, CKMBINDEX, TROPONINI in the last 168 hours. BNP (last 3 results) No results for input(s): PROBNP in the last 8760 hours. HbA1C: No results for input(s): HGBA1C in  the last 72 hours.  CBG: Recent Labs  Lab 05/26/21 1240 05/26/21 1600 05/26/21 2115 05/27/21 0738 05/27/21 1221  GLUCAP 110* 117* 117* 151* 80    Lipid Profile: No results for input(s): CHOL, HDL, LDLCALC, TRIG, CHOLHDL, LDLDIRECT in the last 72 hours. Thyroid Function Tests: No results for input(s): TSH, T4TOTAL, FREET4, T3FREE, THYROIDAB in the last 72 hours. Anemia Panel: No results for input(s): VITAMINB12, FOLATE, FERRITIN, TIBC, IRON, RETICCTPCT in the last 72 hours. Sepsis Labs: No results for input(s): PROCALCITON, LATICACIDVEN in the last 168 hours.  Recent Results (from the past 240 hour(s))  Resp Panel by RT-PCR (Flu A&B, Covid) Nasopharyngeal Swab     Status: None   Collection Time: 05/20/21  1:08 PM   Specimen: Nasopharyngeal Swab; Nasopharyngeal(NP) swabs in vial transport medium  Result Value Ref Range Status   SARS Coronavirus 2 by RT PCR NEGATIVE NEGATIVE Final    Comment: (NOTE) SARS-CoV-2 target nucleic acids are NOT DETECTED.  The SARS-CoV-2 RNA is generally detectable in upper respiratory specimens during the  acute phase of infection. The lowest concentration of SARS-CoV-2 viral copies this assay can detect is 138 copies/mL. A negative result does not preclude SARS-Cov-2 infection and should not be used as the sole basis for treatment or other patient management decisions. A negative result may occur with  improper specimen collection/handling, submission of specimen other than nasopharyngeal swab, presence of viral mutation(s) within the areas targeted by this assay, and inadequate number of viral copies(<138 copies/mL). A negative result must be combined with clinical observations, patient history, and epidemiological information. The expected result is Negative.  Fact Sheet for Patients:  EntrepreneurPulse.com.au  Fact Sheet for Healthcare Providers:  IncredibleEmployment.be  This test is no t yet approved or  cleared by the [place] FDA and  has been authorized for detection and/or diagnosis of SARS-CoV-2 by FDA under an Emergency Use Authorization (EUA). This EUA will remain  in effect (meaning this test can be used) for the duration of the COVID-19 declaration under Section 564(b)(1) of the Act, 21 U.S.C.section 360bbb-3(b)(1), unless the authorization is terminated  or revoked sooner.       Influenza A by PCR NEGATIVE NEGATIVE Final   Influenza B by PCR NEGATIVE NEGATIVE Final    Comment: (NOTE) The Xpert Xpress SARS-CoV-2/FLU/RSV plus assay is intended as an aid in the diagnosis of influenza from Nasopharyngeal swab specimens and should not be used as a sole basis for treatment. Nasal washings and aspirates are unacceptable for Xpert Xpress SARS-CoV-2/FLU/RSV testing.  Fact Sheet for Patients: EntrepreneurPulse.com.au  Fact Sheet for Healthcare Providers: IncredibleEmployment.be  This test is not yet approved or cleared by the [place] FDA and has been authorized for detection and/or diagnosis of SARS-CoV-2 by FDA under an Emergency Use Authorization (EUA). This EUA will remain in effect (meaning this test can be used) for the duration of the COVID-19 declaration under Section 564(b)(1) of the Act, 21 U.S.C. section 360bbb-3(b)(1), unless the authorization is terminated or revoked.  Performed at [facility], [place] [address] [place], [place] [address]       Radiology Studies: No results found.  Scheduled Meds:  enoxaparin (LOVENOX) injection  120 mg Subcutaneous BID   furosemide  40 mg Oral q morning   insulin aspart  0-20 Units Subcutaneous TID WC   insulin glargine  13 Units Subcutaneous QHS   lidocaine  1 patch Transdermal Q24H   lisinopril  2.5 mg Oral Daily   metoprolol succinate  50 mg Oral QHS   polycarbophil  625 mg Oral Daily   potassium chloride  10 mEq Oral QHS   sodium chloride flush  3 mL Intravenous  Q12H   tamsulosin  0.4 mg Oral QPC breakfast   topiramate  50 mg Oral BID   warfarin  7.5 mg Oral ONCE-1600   Warfarin - Pharmacist Dosing Inpatient   Does not apply q1600   Continuous Infusions:  sodium chloride       LOS: 7 days   Marylu Lund, MD [facility] Pager On [employer]ion  If 7PM-7AM, please contact night-coverage 05/27/2021, 3:03 PM

## 2021-05-27 NOTE — Progress Notes (Signed)
Mr. Garfinkel is now on Lovenox and Coumadin.  His INR is 1.9.  He had a repeat CT angiogram done on Friday.  There is still extensive clot in his lungs.  He is walking.  He is having no shortness of breath.  He is having no chest wall pain.  There is no bleeding.  He is eating okay.  He is having no nausea or vomiting.  His labs show white cell count of 3.6.  Hemoglobin 14.6.  Platelet count 107,000.  So far, his thrombophilic studies did not show the positive lupus anticoagulant.  He says there is a history of lupus in his family.  I told him that he does not have lupus.  He has had no problems with bowels or bladder.  There is no leg swelling.  He has had no leg pain.  His vital signs all look stable.  Pulses 71.  Temperature 97.6.  Blood pressure 102/65.  His lungs sound pretty clear bilaterally.  Cardiac exam regular rate and rhythm.  Abdomen is soft.  He has no fluid wave.  Extremities shows no clubbing, cyanosis or edema.  He continues on the Lovenox and Coumadin.  It would be nice if there is less clot in his lungs.  I know it has only  been 4 days since he had initial CT angiogram done.  Again I think we have to wait for the INR to be 3.  I know that pharmacy is monitoring the Coumadin pretty closely.  It be nice to check his electrolytes to make sure his kidney function is doing well.  Lattie Haw, MD  Psalm 113:3

## 2021-05-27 NOTE — Discharge Instructions (Signed)
Information on my medicine - Coumadin   (Warfarin)  Why was Coumadin prescribed for you? Coumadin was prescribed for you because you have a blood clot or a medical condition that can cause an increased risk of forming blood clots. Blood clots can cause serious health problems by blocking the flow of blood to the heart, lung, or brain. Coumadin can prevent harmful blood clots from forming. As a reminder your indication for Coumadin is:   recurrent PE's.  What test will check on my response to Coumadin? While on Coumadin (warfarin) you will need to have an INR test regularly to ensure that your dose is keeping you in the desired range. The INR (international normalized ratio) number is calculated from the result of the laboratory test called prothrombin time (PT).  If an INR APPOINTMENT HAS NOT ALREADY BEEN MADE FOR YOU please schedule an appointment to have this lab work done by your health care provider within 7 days. Ask your health care provider during an office visit what your goal INR is . Your INR goal is (3-3.5)  What  do you need to  know  About  COUMADIN? Take Coumadin (warfarin) exactly as prescribed by your healthcare provider about the same time each day.  DO NOT stop taking without talking to the doctor who prescribed the medication.  Stopping without other blood clot prevention medication to take the place of Coumadin may increase your risk of developing a new clot or stroke.  Get refills before you run out.  What do you do if you miss a dose? If you miss a dose, take it as soon as you remember on the same day then continue your regularly scheduled regimen the next day.  Do not take two doses of Coumadin at the same time.  Important Safety Information A possible side effect of Coumadin (Warfarin) is an increased risk of bleeding. You should call your healthcare provider right away if you experience any of the following: Bleeding from an injury or your nose that does not  stop. Unusual colored urine (red or dark brown) or unusual colored stools (red or black). Unusual bruising for unknown reasons. A serious fall or if you hit your head (even if there is no bleeding).  Some foods or medicines interact with Coumadin (warfarin) and might alter your response to warfarin. To help avoid this: Eat a balanced diet, maintaining a consistent amount of Vitamin K. Notify your provider about major diet changes you plan to make. Avoid alcohol or limit your intake to 1 drink for women and 2 drinks for men per day. (1 drink is 5 oz. wine, 12 oz. beer, or 1.5 oz. liquor.)  Make sure that ANY health care provider who prescribes medication for you knows that you are taking Coumadin (warfarin).  Also make sure the healthcare provider who is monitoring your Coumadin knows when you have started a new medication including herbals and non-prescription products.  Coumadin (Warfarin)  Major Drug Interactions  Increased Warfarin Effect Decreased Warfarin Effect  Alcohol (large quantities) Antibiotics (esp. Septra/Bactrim, Flagyl, Cipro) Amiodarone (Cordarone) Aspirin (ASA) Cimetidine (Tagamet) Megestrol (Megace) NSAIDs (ibuprofen, naproxen, etc.) Piroxicam (Feldene) Propafenone (Rythmol SR) Propranolol (Inderal) Isoniazid (INH) Posaconazole (Noxafil) Barbiturates (Phenobarbital) Carbamazepine (Tegretol) Chlordiazepoxide (Librium) Cholestyramine (Questran) Griseofulvin Oral Contraceptives Rifampin Sucralfate (Carafate) Vitamin K   Coumadin (Warfarin) Major Herbal Interactions  Increased Warfarin Effect Decreased Warfarin Effect  Garlic Ginseng Ginkgo biloba Coenzyme Q10 Green tea St. John's wort    Coumadin (Warfarin) FOOD Interactions  Eat a  consistent number of servings per week of foods HIGH in Vitamin K (1 serving =  cup)  Collards (cooked, or boiled & drained) Kale (cooked, or boiled & drained) Mustard greens (cooked, or boiled & drained) Parsley  *serving size only =  cup Spinach (cooked, or boiled & drained) Swiss chard (cooked, or boiled & drained) Turnip greens (cooked, or boiled & drained)  Eat a consistent number of servings per week of foods MEDIUM-HIGH in Vitamin K (1 serving = 1 cup)  Asparagus (cooked, or boiled & drained) Broccoli (cooked, boiled & drained, or raw & chopped) Brussel sprouts (cooked, or boiled & drained) *serving size only =  cup Lettuce, raw (green leaf, endive, romaine) Spinach, raw Turnip greens, raw & chopped   These websites have more information on Coumadin (warfarin):  FailFactory.se; VeganReport.com.au;

## 2021-05-28 DIAGNOSIS — I2699 Other pulmonary embolism without acute cor pulmonale: Secondary | ICD-10-CM | POA: Diagnosis not present

## 2021-05-28 DIAGNOSIS — I2510 Atherosclerotic heart disease of native coronary artery without angina pectoris: Secondary | ICD-10-CM | POA: Diagnosis not present

## 2021-05-28 DIAGNOSIS — I10 Essential (primary) hypertension: Secondary | ICD-10-CM | POA: Diagnosis not present

## 2021-05-28 LAB — CBC
HCT: 47.6 % (ref 39.0–52.0)
Hemoglobin: 15.4 g/dL (ref 13.0–17.0)
MCH: 31.4 pg (ref 26.0–34.0)
MCHC: 32.4 g/dL (ref 30.0–36.0)
MCV: 96.9 fL (ref 80.0–100.0)
Platelets: 102 10*3/uL — ABNORMAL LOW (ref 150–400)
RBC: 4.91 MIL/uL (ref 4.22–5.81)
RDW: 14 % (ref 11.5–15.5)
WBC: 3.3 10*3/uL — ABNORMAL LOW (ref 4.0–10.5)
nRBC: 0 % (ref 0.0–0.2)

## 2021-05-28 LAB — GLUCOSE, CAPILLARY
Glucose-Capillary: 179 mg/dL — ABNORMAL HIGH (ref 70–99)
Glucose-Capillary: 97 mg/dL (ref 70–99)
Glucose-Capillary: 117 mg/dL — ABNORMAL HIGH (ref 70–99)

## 2021-05-28 LAB — PROTIME-INR
INR: 2.1 — ABNORMAL HIGH (ref 0.8–1.2)
Prothrombin Time: 23.2 seconds — ABNORMAL HIGH (ref 11.4–15.2)

## 2021-05-28 MED ORDER — WARFARIN SODIUM 5 MG PO TABS
10.0000 mg | ORAL_TABLET | Freq: Once | ORAL | Status: AC
  Administered 2021-05-28: 10 mg via ORAL
  Filled 2021-05-28: qty 2

## 2021-05-28 NOTE — Progress Notes (Signed)
So far, things are going pretty well.  He is on Lovenox.  He is getting Coumadin.  His INR today is 2.1.  Hopefully, he will be therapeutic at an INR 3 tomorrow so that he will be able to go home.  There is no bleeding.  His white cell count is 3.3.  Hemoglobin 15.4.  Platelet count 102,000.  I am not too worried about the low white cell count.  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  So far, all of this hypercoagulable studies just show the lupus anticoagulant.  This could be a transiently positive issue.  I would repeat this in about 3 months to see exactly what is going on with this.  He is out of bed.  He is eating okay.  There is no problems with nausea or vomiting.  Again, we will await the INR to be close to 3 and then he should be able to go home.  I would repeat the CT angiogram of his chest in about a month or so and hopefully will see that the clot burden has decreased significantly.  Lattie Haw, MD  Psalm 113:2

## 2021-05-28 NOTE — Progress Notes (Addendum)
ANTICOAGULATION CONSULT NOTE - Follow Up Consult  Pharmacy Consult for Coumadin Indication: pulmonary embolus  Allergies  Allergen Reactions   Penicillins Hives    Has patient had a PCN reaction causing immediate rash, facial/tongue/throat swelling, SOB or lightheadedness with hypotension: No Has patient had a PCN reaction causing severe rash involving mucus membranes or skin necrosis: No Has patient had a PCN reaction that required hospitalization No Has patient had a PCN reaction occurring within the last 10 years: No If all of the above answers are "NO", then may proceed with Cephalosporin use.    Patient Measurements: Height: 5\' 7"  (170.2 cm) Weight: 124.3 kg (274 lb) IBW/kg (Calculated) : 66.1  Vital Signs: Temp: 98.1 F (36.7 C) (06/21 0741) Temp Source: Oral (06/21 0741) BP: 148/57 (06/21 0741) Pulse Rate: 81 (06/21 0741)  Labs: Recent Labs    05/26/21 0144 05/27/21 0455 05/28/21 0142  HGB 14.4 14.6 15.4  HCT 44.4 45.5 47.6  PLT 97* 107* 102*  LABPROT 15.8* 19.6* 23.2*  INR 1.3* 1.7* 2.1*     Estimated Creatinine Clearance: 107 mL/min (by C-G formula based on SCr of 1.01 mg/dL).   Assessment: Anticoag: Low-dose Eliquis (LD 6/12) for hx of recurrent PE's. Chronic thrombocytopenia (BL 50-100). D-dimer >20; Antithrombin Act 90. Lupus anticoagulant negative. +age-indeterminat DVT. Coumadin preferred per heme>>starting 6/17. INR goal 3-3.5. Lupus anticoagulant + but this could be a false positive from the DOACs. Needs re-testing in 3 months. - CT Venogram revealing patent IVC and no significant findings - no thrombus in iliac veins - CT 6/13 and 6/17: Extensive PE remains - INR 2.1 today. Hgb 15.4 stable. Plts 102 low but stable..  Goal of Therapy:  INR 3-3.5 Monitor platelets by anticoagulation protocol: Yes   Plan:  Lovenox 120mg  SQ BID Coumadin 10mg  po x 1 tonight. Daily INR   [place] S. Alford Highland, PharmD, BCPS Clinical Staff  Pharmacist [email or web address]  Alford Highland, Savi Lastinger Stillinger 05/28/2021,9:02 AM

## 2021-05-28 NOTE — Plan of Care (Signed)

## 2021-05-28 NOTE — Progress Notes (Signed)
PROGRESS NOTE    Travis Rollins  YQI:[id number] DOB: Nov 17, 1966 DOA: 05/19/2021 PCP: Boyce Medici, FNP    Brief Narrative:  55 year old gentleman history of recurrent PEs was on Coumadin, Xarelto, Pradaxa, and currently on apixaban presented to the ED with left lower back pain and shortness of breath which has been worsening x5 days.  Patient status post recent COVID-19 booster 05/10/2021.  CT angiogram chest performed with extensive bilateral PE with right ventricular strain.  CT renal stone protocol with hepatic steatosis otherwise unremarkable.  Lower extremity Dopplers done with age  indeterminate DVT of right gastrocnemius vein.  2D echo ordered and pending.  Hematology consulted patient seen in consultation by Dr. Marin Olp who is recommending evaluation by IR or vascular surgery for evaluation for thrombolytic therapy.  Also concern for IVC filter being clogged with clots.  Anticoagulation panel pending.  PCCM and IR consulted for further evaluation and management.  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 -CT angiogram done with extensive bilateral PE with right ventricular strain. -2D echo pending. -Lower extremity Dopplers with age-indeterminate right gastrocnemius vein DVT. -Hypercoagulable in progress -PCCM, Hematology, IR had been following.   -Recommendations for transtion to coumadin. Continue lovenox bridge with goal INR of at least 3.0 prior to d/c -PCCM recommends repeat TTE in 3 months -INR up to 2.1 today  2.  Chronic thrombocytopenia -Patient with no overt bleeding. -Statin and allopurinol on hold. -repeat cbc in AM  3.  Diabetes mellitus type 2 -Hemoglobin A1c 5.9 (03/12/2016) -Repeat hemoglobin A1c of 7.6 -Continue CBG before meals and at bedtime -On Lantus dose to 13 units daily as patient to be n.p.o. after midnight.   -Continue with SSI.  4.  Coronary artery disease -Remains stable -Continue beta-blocker.  5.  Hypertension -BP remains stable -Monitor blood pressure secondary to problem #1 -Continue lisinopril, Toprol-XL.  6.  Hyperlipidemia -Statin on hold secondary to thrombocytopenia  7.  Venous insufficiency -Remains stable  8.  Weight management -Continue Topamax.    DVT prophylaxis: coumadin with lovenox bridge Code Status: Full Family Communication: Pt in room, family not at bedside  Status is: Inpatient  Remains inpatient appropriate because:Inpatient level of care appropriate due to severity of illness  Dispo: The patient is from: Home              Anticipated d/c is to: Home              Patient currently is not medically stable to d/c.   Difficult to place patient No   Consultants:  PCCM Hematology IR  Procedures:    Antimicrobials: Anti-infectives (From admission, onward)    None       Subjective: No complaints at this time  Objective: Vitals:   05/28/21 0318 05/28/21 0741 05/28/21 1126 05/28/21 1529  BP: 126/72 (!) 148/57 116/67 93/64  Pulse: 77 81 80 84  Resp: 20 17 19 20   Temp: 97.7  F (36.5 C) 98.1 F (36.7 C) 99 F (37.2 C) 98 F (36.7 C)  TempSrc: Oral Oral Oral Oral  SpO2: 99% 99% 95% 100%  Weight:      Height:        Intake/Output Summary (Last 24 hours) at 05/28/2021 1652 Last data filed at 05/27/2021 1945 Gross per 24 hour  Intake --   Output 400 ml  Net -400 ml    Filed Weights   05/19/21 2058  Weight: 124.3 kg    Examination: General exam: Conversant, in no acute distress Respiratory system: normal chest rise, clear, no audible wheezing Cardiovascular system: regular rhythm, s1-s2 Gastrointestinal system: Nondistended, nontender, pos BS Central nervous system: No seizures, no tremors Extremities: No cyanosis, no joint deformities Skin: No rashes, no pallor Psychiatry: Affect normal // no auditory hallucinations   Data Reviewed: I have personally reviewed following labs and imaging studies  CBC: Recent Labs  Lab 05/22/21 0306 05/23/21 0139 05/24/21 0944 05/25/21 0607 05/26/21 0144 05/27/21 0455 05/28/21 0142  WBC 4.8   < > 3.4* 3.2* 4.0 3.6* 3.3*  NEUTROABS 2.4  --   --   --   --   --   --   HGB 13.9   < > 14.6 13.9 14.4 14.6 15.4  HCT 43.6   < > 46.0 43.4 44.4 45.5 47.6  MCV 100.5*   < > 99.4 98.6 97.8 97.4 96.9  PLT 79*   < > 93* 92* 97* 107* 102*   < > = values in this interval not displayed.    Basic Metabolic Panel: Recent Labs  Lab 05/22/21 0306  NA 137  K 4.2  CL 104  CO2 23  GLUCOSE 120*  BUN 24*  CREATININE 1.01  CALCIUM 8.6*    GFR: Estimated Creatinine Clearance: 107 mL/min (by C-G formula based on SCr of 1.01 mg/dL). Liver Function Tests: No results for input(s): AST, ALT, ALKPHOS, BILITOT, PROT, ALBUMIN in the last 168 hours.  No results for input(s): LIPASE, AMYLASE in the last 168 hours. No results for input(s): AMMONIA in the last 168 hours. Coagulation Profile: Recent Labs  Lab 05/25/21 0607 05/26/21 0144 05/27/21 0455 05/28/21 0142  INR 1.1 1.3* 1.7* 2.1*    Cardiac Enzymes: No results for input(s): CKTOTAL, CKMB, CKMBINDEX, TROPONINI in the last 168 hours. BNP (last 3 results) No results for input(s): PROBNP in the last 8760 hours. HbA1C: No results for input(s): HGBA1C in the last 72 hours.  CBG: Recent Labs  Lab 05/27/21 1706 05/27/21 1944  05/28/21 0738 05/28/21 1157 05/28/21 1628  GLUCAP 130* 153* 179* 97 117*    Lipid Profile: No results for input(s): CHOL, HDL, LDLCALC, TRIG, CHOLHDL, LDLDIRECT in the last 72 hours. Thyroid Function Tests: No results for input(s): TSH, T4TOTAL, FREET4, T3FREE, THYROIDAB in the last 72 hours. Anemia Panel: No results for input(s): VITAMINB12, FOLATE, FERRITIN, TIBC, IRON, RETICCTPCT in the last 72 hours. Sepsis Labs: No results for input(s): PROCALCITON, LATICACIDVEN in the last 168 hours.  Recent Results (from the past 240 hour(s))  Resp Panel by RT-PCR (Flu A&B, Covid) Nasopharyngeal Swab     Status: None   Collection Time: 05/20/21  1:08 PM   Specimen: Nasopharyngeal Swab; Nasopharyngeal(NP) swabs in vial transport medium  Result Value Ref Range Status   SARS Coronavirus 2 by RT PCR NEGATIVE NEGATIVE Final    Comment: (NOTE) SARS-CoV-2 target nucleic acids are NOT DETECTED.  The SARS-CoV-2 RNA is generally detectable in upper respiratory specimens during the acute  phase of infection. The lowest concentration of SARS-CoV-2 viral copies this assay can detect is 138 copies/mL. A negative result does not preclude SARS-Cov-2 infection and should not be used as the sole basis for treatment or other patient management decisions. A negative result may occur with  improper specimen collection/handling, submission of specimen other than nasopharyngeal swab, presence of viral mutation(s) within the areas targeted by this assay, and inadequate number of viral copies(<138 copies/mL). A negative result must be combined with clinical observations, patient history, and epidemiological information. The expected result is Negative.  Fact Sheet for Patients:  EntrepreneurPulse.com.au  Fact Sheet for Healthcare Providers:  IncredibleEmployment.be  This test is no t yet approved or cleared by the [place] FDA and  has been authorized for detection  and/or diagnosis of SARS-CoV-2 by FDA under an Emergency Use Authorization (EUA). This EUA will remain  in effect (meaning this test can be used) for the duration of the COVID-19 declaration under Section 564(b)(1) of the Act, 21 U.S.C.section 360bbb-3(b)(1), unless the authorization is terminated  or revoked sooner.       Influenza A by PCR NEGATIVE NEGATIVE Final   Influenza B by PCR NEGATIVE NEGATIVE Final    Comment: (NOTE) The Xpert Xpress SARS-CoV-2/FLU/RSV plus assay is intended as an aid in the diagnosis of influenza from Nasopharyngeal swab specimens and should not be used as a sole basis for treatment. Nasal washings and aspirates are unacceptable for Xpert Xpress SARS-CoV-2/FLU/RSV testing.  Fact Sheet for Patients: EntrepreneurPulse.com.au  Fact Sheet for Healthcare Providers: IncredibleEmployment.be  This test is not yet approved or cleared by the [place] FDA and has been authorized for detection and/or diagnosis of SARS-CoV-2 by FDA under an Emergency Use Authorization (EUA). This EUA will remain in effect (meaning this test can be used) for the duration of the COVID-19 declaration under Section 564(b)(1) of the Act, 21 U.S.C. section 360bbb-3(b)(1), unless the authorization is terminated or revoked.  Performed at [facility], [place] [address] [place], [name] [address]       Radiology Studies: No results found.  Scheduled Meds:  enoxaparin (LOVENOX) injection  120 mg Subcutaneous BID   furosemide  40 mg Oral q morning   insulin aspart  0-20 Units Subcutaneous TID WC   insulin glargine  13 Units Subcutaneous QHS   lidocaine  1 patch Transdermal Q24H   lisinopril  2.5 mg Oral Daily   metoprolol succinate  50 mg Oral QHS   polycarbophil  625 mg Oral Daily   potassium chloride  10 mEq Oral QHS   sodium chloride flush  3 mL Intravenous Q12H   tamsulosin  0.4 mg Oral QPC breakfast   topiramate  50 mg Oral BID    Warfarin - Pharmacist Dosing Inpatient   Does not apply q1600   Continuous Infusions:  sodium chloride       LOS: 8 days   Marylu Lund, MD [facility] Pager On Amion  If 7PM-7AM, please contact night-coverage 05/28/2021, 4:52 PM

## 2021-05-29 DIAGNOSIS — I2699 Other pulmonary embolism without acute cor pulmonale: Secondary | ICD-10-CM | POA: Diagnosis not present

## 2021-05-29 LAB — CBC
HCT: 46.9 % (ref 39.0–52.0)
Hemoglobin: 15.4 g/dL (ref 13.0–17.0)
MCH: 31.9 pg (ref 26.0–34.0)
MCHC: 32.8 g/dL (ref 30.0–36.0)
MCV: 97.1 fL (ref 80.0–100.0)
Platelets: 102 10*3/uL — ABNORMAL LOW (ref 150–400)
RBC: 4.83 MIL/uL (ref 4.22–5.81)
RDW: 14 % (ref 11.5–15.5)
WBC: 3.4 10*3/uL — ABNORMAL LOW (ref 4.0–10.5)
nRBC: 0 % (ref 0.0–0.2)

## 2021-05-29 LAB — GLUCOSE, CAPILLARY
Glucose-Capillary: 328 mg/dL — ABNORMAL HIGH (ref 70–99)
Glucose-Capillary: 71 mg/dL (ref 70–99)
Glucose-Capillary: 124 mg/dL — ABNORMAL HIGH (ref 70–99)
Glucose-Capillary: 123 mg/dL — ABNORMAL HIGH (ref 70–99)

## 2021-05-29 LAB — PROTIME-INR
INR: 2.4 — ABNORMAL HIGH (ref 0.8–1.2)
Prothrombin Time: 26.3 seconds — ABNORMAL HIGH (ref 11.4–15.2)

## 2021-05-29 MED ORDER — WARFARIN SODIUM 5 MG PO TABS
10.0000 mg | ORAL_TABLET | Freq: Once | ORAL | Status: AC
  Administered 2021-05-29: 10 mg via ORAL
  Filled 2021-05-29: qty 2

## 2021-05-29 NOTE — Progress Notes (Signed)
PROGRESS NOTE    Travis Rollins  EPP:[id number] DOB: 12-Jun-1966 DOA: 05/19/2021 PCP: Boyce Medici, FNP    Brief Narrative:  55 year old gentleman history of recurrent PEs was on Coumadin, Xarelto, Pradaxa, and currently on apixaban presented to the ED with left lower back pain and shortness of breath which has been worsening x5 days.  Patient status post recent COVID-19 booster 05/10/2021.  CT angiogram chest performed with extensive bilateral PE with right ventricular strain.  CT renal stone protocol with hepatic steatosis otherwise unremarkable.  Lower extremity Dopplers done with age  indeterminate DVT of right gastrocnemius vein.  2D echo ordered and pending.  Hematology consulted patient seen in consultation by Dr. Marin Olp who is recommending evaluation by IR or vascular surgery for evaluation for thrombolytic therapy.  Also concern for IVC filter being clogged with clots.  Anticoagulation panel pending.  PCCM and IR consulted for further evaluation and management.  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 -CT angiogram done with extensive bilateral PE with right ventricular strain. -Lower extremity Dopplers with age-indeterminate right gastrocnemius vein DVT. -Hypercoagulable in progress -PCCM, Hematology, IR had been following.  Plan: --Per oncology,  Recommendations for transtion to coumadin.  goal INR of at least 3.0 prior to d/c --cont bridging with Lovenox -PCCM recommends repeat TTE in 3 months  2.  Chronic thrombocytopenia -Patient with no overt bleeding. --hold statin and allopurinol   3.  Diabetes mellitus type 2 --A1c of 7.6 --SSI TID --Lantus 13u nightly  4.  Coronary artery disease --home statin held since admission  5.  Hypertension -BP remains stable --cont lasix, lisinopril and Toprol  6.  Hyperlipidemia -Statin on hold secondary to thrombocytopenia  7.  Venous insufficiency -Remains stable  8.  Weight management -Continue Topamax.    DVT prophylaxis: coumadin with lovenox bridge Code Status: Full Family Communication:  Status is: Inpatient  Remains inpatient appropriate because:Inpatient level of care appropriate due to severity of illness  Dispo: The patient is from: Home              Anticipated d/c is to: Home              Patient currently is not medically stable to d/c.  Need INR to be ~3   Difficult to place patient No   Consultants:  PCCM Hematology IR  Procedures:    Antimicrobials: Anti-infectives (From admission, onward)    None       Subjective: Denied pain.  No dyspnea.  Normal oral intake, urination and BM.  Chronic leg edema.   Objective: Vitals:   05/29/21 0028 05/29/21 0559 05/29/21 0804 05/29/21 1518  BP: (!) 106/57 (!) 94/57 120/74 109/64  Pulse: 75  88 74  Resp: 18 16 18 20   Temp: 97.6 F (36.4 C) 97.6 F (36.4 C) 97.6 F (36.4 C) 97.9 F (36.6 C)  TempSrc: Axillary Oral Oral Oral  SpO2: 96% 95% 95% 98%  Weight:      Height:        Intake/Output Summary (Last 24 hours) at 05/29/2021 1723 Last data filed at 05/29/2021 0945 Gross per 24 hour  Intake 480 ml  Output 600 ml  Net -120 ml   Filed Weights   05/19/21 2058  Weight: 124.3 kg    Examination: Constitutional: NAD, AAOx3 HEENT: conjunctivae and lids normal, EOMI CV: No cyanosis.   RESP:  normal respiratory effort, on RA Extremities: tense chronic edema in BLE, L>R SKIN: warm, dry Neuro: II - XII grossly intact.   Psych: Normal mood and affect.  Appropriate judgement and reason   Data Reviewed: I have personally reviewed following labs and imaging studies  CBC: Recent Labs  Lab 05/25/21 0607 05/26/21 0144 05/27/21 0455 05/28/21 0142 05/29/21 0122  WBC 3.2* 4.0 3.6* 3.3* 3.4*  HGB 13.9 14.4 14.6 15.4 15.4  HCT 43.4 44.4 45.5 47.6 46.9  MCV 98.6 97.8 97.4 96.9 97.1  PLT 92* 97* 107* 102* 568*   Basic Metabolic Panel: No results for input(s): NA, K, CL, CO2, GLUCOSE, BUN, CREATININE, CALCIUM, MG, PHOS in the last 168 hours. GFR: Estimated Creatinine Clearance: 107 mL/min (by C-G formula based on SCr of 1.01 mg/dL). Liver Function Tests: No results for input(s): AST, ALT, ALKPHOS, BILITOT, PROT, ALBUMIN in the last 168 hours.  No results for input(s): LIPASE, AMYLASE in the last 168 hours. No results for input(s): AMMONIA in the last 168 hours. Coagulation Profile: Recent Labs  Lab 05/25/21 0607 05/26/21 0144 05/27/21 0455 05/28/21 0142 05/29/21 0122  INR 1.1 1.3* 1.7* 2.1* 2.4*   Cardiac Enzymes: No results for input(s): CKTOTAL, CKMB, CKMBINDEX, TROPONINI in the last 168 hours. BNP (last 3 results) No results for input(s): PROBNP in the last 8760 hours. HbA1C: No results for input(s): HGBA1C in the last 72 hours.  CBG: Recent Labs  Lab 05/28/21 1157 05/28/21 1628 05/29/21 0800 05/29/21 1131 05/29/21 1650  GLUCAP 97 117* 328* 71 124*   Lipid Profile: No results for input(s): CHOL, HDL, LDLCALC, TRIG, CHOLHDL, LDLDIRECT in the last 72 hours. Thyroid Function Tests: No results for input(s): TSH, T4TOTAL, FREET4, T3FREE, THYROIDAB in the last 72 hours. Anemia Panel: No results for input(s): VITAMINB12, FOLATE, FERRITIN, TIBC, IRON, RETICCTPCT in the last 72 hours. Sepsis Labs: No results for input(s): PROCALCITON, LATICACIDVEN in the last  168 hours.  Recent Results (from the past 240 hour(s))  Resp Panel by RT-PCR (Flu A&B, Covid) Nasopharyngeal Swab     Status: None   Collection Time: 05/20/21  1:08 PM   Specimen: Nasopharyngeal Swab; Nasopharyngeal(NP) swabs in vial transport medium  Result Value Ref Range Status   SARS Coronavirus 2 by RT PCR NEGATIVE NEGATIVE Final    Comment: (NOTE) SARS-CoV-2 target nucleic acids are NOT DETECTED.  The SARS-CoV-2 RNA is generally detectable in upper respiratory specimens during the acute phase of infection. The lowest concentration of SARS-CoV-2 viral copies this assay can detect is 138 copies/mL. A negative result does not preclude SARS-Cov-2 infection and should not be used as the sole basis for treatment or other patient management decisions. A negative result may occur with  improper specimen collection/handling, submission of specimen other than nasopharyngeal swab, presence of viral mutation(s) within the areas targeted by this assay, and inadequate number of viral copies(<138 copies/mL). A negative result must be combined with clinical observations, patient history, and epidemiological information. The expected result is Negative.  Fact Sheet for Patients:  EntrepreneurPulse.com.au  Fact Sheet for Healthcare  Providers:  IncredibleEmployment.be  This test is no t yet approved or cleared by the [place] and  has been authorized for detection and/or diagnosis of SARS-CoV-2 by FDA under an Emergency Use Authorization (EUA). This EUA will remain  in effect (meaning this test can be used) for the duration of the COVID-19 declaration under Section 564(b)(1) of the Act, 21 U.S.C.section 360bbb-3(b)(1), unless the authorization is terminated  or revoked sooner.       Influenza A by PCR NEGATIVE NEGATIVE Final   Influenza B by PCR NEGATIVE NEGATIVE Final    Comment: (NOTE) The Xpert Xpress SARS-CoV-2/FLU/RSV plus assay is  intended as an aid in the diagnosis of influenza from Nasopharyngeal swab specimens and should not be used as a sole basis for treatment. Nasal washings and aspirates are unacceptable for Xpert Xpress SARS-CoV-2/FLU/RSV testing.  Fact Sheet for Patients: EntrepreneurPulse.com.au  Fact Sheet for Healthcare Providers: IncredibleEmployment.be  This test is not yet approved or cleared by the [place] FDA and has been authorized for detection and/or diagnosis of SARS-CoV-2 by FDA under an Emergency Use Authorization (EUA). This EUA will remain in effect (meaning this test can be used) for the duration of the COVID-19 declaration under Section 564(b)(1) of the Act, 21 U.S.C. section 360bbb-3(b)(1), unless the authorization is terminated or revoked.  Performed at [facility], [place] [address], [place], [place] [address]       Radiology Studies: No results found.  Scheduled Meds:  enoxaparin (LOVENOX) injection  120 mg Subcutaneous BID   furosemide  40 mg Oral q morning   insulin aspart  0-20 Units Subcutaneous TID WC   insulin glargine  13 Units Subcutaneous QHS   lidocaine  1 patch Transdermal Q24H   lisinopril  2.5 mg Oral Daily   metoprolol succinate  50 mg Oral QHS   polycarbophil  625 mg Oral Daily   potassium chloride  10 mEq Oral QHS   sodium chloride flush  3 mL Intravenous Q12H   tamsulosin  0.4 mg Oral QPC breakfast   topiramate  50 mg Oral BID   Warfarin - Pharmacist Dosing Inpatient   Does not apply q1600   Continuous Infusions:  sodium chloride       LOS: 9 days   Enzo Bi, MD [facility] Pager On Amion  If 7PM-7AM, please contact night-coverage 05/29/2021, 5:23 PM

## 2021-05-29 NOTE — Progress Notes (Signed)
So far, everything is going pretty well.  He feels well.  He has had no problems with chest wall pain.  There is no cough.  No shortness of breath.  His INR today is 2.4.  The platelet count is 102,000.  I am really not worried about this.  He wants the IVs taken out of his arms.  Did not being used.  I can agree with this.  He is out of bed.  He is going to the bathroom okay.  He has had no fever.  His vital signs are temperature 97.6.  Pulse 75.  Blood pressure 194/57.  His lungs are clear.  Cardiac exam regular rate and rhythm.  Abdomen is soft.  He is obese.  There is no fluid wave.  For right now, we will wait for the INR to get to close to 3.  All of his hypercoagulable studies are unremarkable outside of the lupus anticoagulant and this might be transiently positive.  I know that he is getting fantastic care from all the staff on [address].  I appreciate all their efforts and hard work.  Lattie Haw, MD  [name] 9:10

## 2021-05-29 NOTE — Progress Notes (Addendum)
ANTICOAGULATION CONSULT NOTE - Follow Up Consult  Pharmacy Consult for Coumadin Indication: pulmonary embolus  Allergies  Allergen Reactions   Penicillins Hives    Has patient had a PCN reaction causing immediate rash, facial/tongue/throat swelling, SOB or lightheadedness with hypotension: No Has patient had a PCN reaction causing severe rash involving mucus membranes or skin necrosis: No Has patient had a PCN reaction that required hospitalization No Has patient had a PCN reaction occurring within the last 10 years: No If all of the above answers are "NO", then may proceed with Cephalosporin use.    Patient Measurements: Height: 5\' 7"  (170.2 cm) Weight: 124.3 kg (274 lb) IBW/kg (Calculated) : 66.1  Vital Signs: Temp: 97.6 F (36.4 C) (06/22 0804) Temp Source: Oral (06/22 0804) BP: 120/74 (06/22 0804) Pulse Rate: 88 (06/22 0804)  Labs: Recent Labs    05/27/21 0455 05/28/21 0142 05/29/21 0122  HGB 14.6 15.4 15.4  HCT 45.5 47.6 46.9  PLT 107* 102* 102*  LABPROT 19.6* 23.2* 26.3*  INR 1.7* 2.1* 2.4*     Estimated Creatinine Clearance: 107 mL/min (by C-G formula based on SCr of 1.01 mg/dL).   Assessment: Anticoag: Low-dose Eliquis (LD 6/12) for hx of recurrent PE's. Chronic thrombocytopenia (BL 50-100). D-dimer >20; Antithrombin Act 90. Lupus anticoagulant negative. +age-indeterminat DVT. Coumadin preferred per heme>>starting 6/17. INR goal 3-3.5. Lupus anticoagulant + but this could be a false positive from the DOACs. Needs re-testing in 3 months. - CT Venogram revealing patent IVC and no significant findings - no thrombus in iliac veins - CT 6/13 and 6/17: Extensive PE remains - INR 2.4 today. Hgb 15.4 stable. Plts 102 low but stable..  Goal of Therapy:  INR 3-3.5 Monitor platelets by anticoagulation protocol: Yes   Plan:  Lovenox 120mg  SQ BID Coumadin 10mg  po x 1 tonight. Daily INR   Lerone Onder S. Alford Highland, PharmD, BCPS Clinical Staff  Pharmacist [email or web address]  Alford Highland, [employer] 05/29/2021,9:35 AM

## 2021-05-30 LAB — CBC
HCT: 47.2 % (ref 39.0–52.0)
Hemoglobin: 15.3 g/dL (ref 13.0–17.0)
MCH: 31.4 pg (ref 26.0–34.0)
MCHC: 32.4 g/dL (ref 30.0–36.0)
MCV: 96.9 fL (ref 80.0–100.0)
Platelets: 107 10*3/uL — ABNORMAL LOW (ref 150–400)
RBC: 4.87 MIL/uL (ref 4.22–5.81)
RDW: 14 % (ref 11.5–15.5)
WBC: 3.6 10*3/uL — ABNORMAL LOW (ref 4.0–10.5)
nRBC: 0 % (ref 0.0–0.2)

## 2021-05-30 LAB — BASIC METABOLIC PANEL
Anion gap: 10 (ref 5–15)
BUN: 36 mg/dL — ABNORMAL HIGH (ref 6–20)
CO2: 24 mmol/L (ref 22–32)
Calcium: 9.2 mg/dL (ref 8.9–10.3)
Chloride: 100 mmol/L (ref 98–111)
Creatinine, Ser: 1.32 mg/dL — ABNORMAL HIGH (ref 0.61–1.24)
GFR, Estimated: >60 mL/min (ref >60)
Glucose, Bld: 109 mg/dL — ABNORMAL HIGH (ref 70–99)
Potassium: 4.3 mmol/L (ref 3.5–5.1)
Sodium: 134 mmol/L — ABNORMAL LOW (ref 135–145)

## 2021-05-30 LAB — PROTIME-INR
INR: 3 — ABNORMAL HIGH (ref 0.8–1.2)
Prothrombin Time: 30.9 seconds — ABNORMAL HIGH (ref 11.4–15.2)

## 2021-05-30 LAB — MAGNESIUM: Magnesium: 2.6 mg/dL — ABNORMAL HIGH (ref 1.7–2.4)

## 2021-05-30 LAB — GLUCOSE, CAPILLARY
Glucose-Capillary: 291 mg/dL — ABNORMAL HIGH (ref 70–99)
Glucose-Capillary: 77 mg/dL (ref 70–99)

## 2021-05-30 MED ORDER — WARFARIN SODIUM 7.5 MG PO TABS: 7.5000 mg | ORAL_TABLET | Freq: Every day | ORAL | 2 refills | Status: DC

## 2021-05-30 MED ORDER — WARFARIN SODIUM 7.5 MG PO TABS: 7.5000 mg | ORAL_TABLET | Freq: Every day | ORAL | Status: DC

## 2021-05-30 NOTE — TOC Initial Note (Addendum)
Transition of Care [facility]) - Initial/Assessment Note    Patient Details  Name: Travis Rollins MRN: [id number] Date of Birth: 02-03-1966  Transition of [facility]) CM/SW Contact:    Joanne Chars, LCSW Phone Number: 05/30/2021, 10:54 AM  Clinical Narrative:     CSW attempted to contact pt listed PCP, FNP Volney Presser.  The listed phone number in epic is to [facility] who were contacted and report that pt is not a current pt there.  Google search reveals Volney Presser working for [employer] on [employer].  CSW attempted to contact [employer] and two different phone numbers and neither phone number is working.  CSW spoke with pt and he reports that Volney Presser is out of business and he intends to start attending [facility] as his PCP.  CSW contacted [facility] and they scheduled pt for an initial visit with FNP [place] on Monday, 06/03/21, at [place].  They report they can monitor pt INR after he is established.  CSW then contacted multiple coumadin clinic possibilities: [facility], Financial controller at [place], Velora Heckler at [facility]  All report that pt needs to be an established pt in order to participate in their coumadin clinics.  CSW informed MD of this situation.    CSW consulted supervisor Olga Coaster about this situation and she recommended that CSW call [facility] regarding the need for INR check after his initial appt on Monday.  CSW called [facility] back and provided this information for the provider who will be seeing pt on Monday.                 Expected Discharge Plan: Home/Self Care Barriers to Discharge: No Barriers Identified   Patient Goals and CMS Choice Patient states their goals for this hospitalization and ongoing recovery are:: "stay healthy" CMS Medicare.gov Compare Post Acute Care list provided to::  (na)    Expected Discharge Plan and Services Expected Discharge Plan: Home/Self Care       Living arrangements for the past 2  months: Single Family Home Expected Discharge Date: 05/30/21               DME Arranged: N/A         HH Arranged: NA HH Agency: NA        Prior Living Arrangements/Services Living arrangements for the past 2 months: Single Family Home Lives with:: Siblings, Parents Patient language and need for interpreter reviewed:: No Do you feel safe going back to the place where you live?: Yes      Need for Family Participation in Patient Care: No (Comment) Care giver support system in place?: Yes (comment) Current home services: Other (comment) (none) Criminal Activity/Legal Involvement Pertinent to Current Situation/Hospitalization: No - Comment as needed  Activities of Daily Living Home Assistive Devices/Equipment: CBG Meter ADL Screening (condition at time of admission) Patient's cognitive ability adequate to safely complete daily activities?: Yes Is the patient deaf or have difficulty hearing?: No Does the patient have difficulty seeing, even when wearing glasses/contacts?: No Does the patient have difficulty concentrating, remembering, or making decisions?: No Patient able to express need for assistance with ADLs?: Yes Does the patient have difficulty dressing or bathing?: No Independently performs ADLs?: Yes (appropriate for developmental age) Does the patient have difficulty walking or climbing stairs?: No Weakness of Legs: None Weakness of Arms/Hands: None  Permission Sought/Granted Permission sought to share information with : Family Supports Permission granted to share  information with : Yes, Verbal Permission Granted  Share Information with NAME: brother Elta [place]           Emotional Assessment Appearance:: Appears stated age Attitude/Demeanor/Rapport: Engaged Affect (typically observed): Appropriate, Pleasant Orientation: : Oriented to Self, Oriented to Place, Oriented to  Time, Oriented to Situation Alcohol / Substance Use: Not Applicable Psych Involvement: No  (comment)  Admission diagnosis:  Bilateral pulmonary embolism (HCC) [I26.99] Acute pulmonary embolism, unspecified pulmonary embolism type, unspecified whether acute cor pulmonale present (HCC) [I26.99] Patient Active Problem List   Diagnosis Date Noted   Type 2 diabetes mellitus without complication, with long-term current use of insulin ([place])    Bilateral pulmonary embolism ([place]) 05/20/2021   Coronary artery disease involving native coronary artery of native heart without angina pectoris 09/03/2020   Mixed hyperlipidemia 09/03/2020   Essential hypertension 09/03/2020   Chronic deep vein thrombosis (DVT) of popliteal vein of left lower extremity ([place]) 04/06/2018   Varicose veins of left lower extremity with complications 14/38/8875   Atypical chest pain 08/09/2015   Thrombocytopenia ([place]) 02/26/2014   Hyperkalemia 02/25/2014   Acute respiratory failure with hypoxia ([place]) 02/25/2014   Morbid obesity ([place]) 03/20/2012   DM (diabetes mellitus), type 2 with renal complications ([place]) 79/72/8206   Venous stasis 03/20/2012   PCP:  Boyce Medici, FNP Pharmacy:   [facility] - [place], [place] - [address] [place] [address] Phone: [phone] Fax: [phone]     Social Determinants of Health (SDOH) Interventions    Readmission Risk Interventions No flowsheet data found.

## 2021-05-30 NOTE — Discharge Summary (Signed)
Physician Discharge Summary   Travis Rollins  male DOB: 25-Aug-1966  QQV:[id number]  PCP: Boyce Medici, FNP  Admit date: 05/19/2021 Discharge date: 05/30/2021  Admitted From: home Disposition:  home CODE STATUS: Full code  Discharge Instructions     Discharge instructions   Complete by: As directed    Please be sure to have your INR checked, goal 3 to 3.5.   Dr. Enzo Bi [facility] Course:  For full details, please see H&P, progress notes, consult notes and ancillary notes.  Briefly,  Travis Rollins is a 55 year old gentleman history of recurrent PEs was on Coumadin, Xarelto, Pradaxa, and currently on apixaban presented to the ED with left lower back pain and shortness of breath which has been worsening x5 days.    Patient status post recent COVID-19 booster 05/10/2021.  CT angiogram chest performed with extensive bilateral PE with right ventricular strain.  CT renal stone protocol with hepatic steatosis otherwise unremarkable.  Lower extremity Dopplers done with age  indeterminate DVT of right gastrocnemius vein.    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 -CT angiogram done with extensive bilateral PE with right ventricular strain. -Lower extremity Dopplers with age-indeterminate right gastrocnemius vein DVT. -Hypercoagulable in progress -PCCM, Hematology, IR consulted. --Per oncology, Recommendations for transtion to coumadin with bridging with Lovenox.  Goal INR 3 to 3.5. --Pt was discharged when INR reached 3, and was discharged on warfarin 7.5 mg daily, per pharm rec.  TOC asked to arrange for pt to have outpatient followup and INR check on 06/03/21. -PCCM recommends repeat TTE in 3 months  Chronic thrombocytopenia -Patient with no overt  bleeding. --home statin and allopurinol held on admission.  Resumed at discharge.  Diabetes mellitus type 2 --A1c of 7.6 --received SSI TID and Lantus 13u nightly while inpatient.   --discharged back on home diabetic regimen.  Coronary artery disease --home statin held on admission.  Resumed at discharge.  Hypertension -BP remains stable --cont lasix, lisinopril and Toprol  Hyperlipidemia --home statin held on admission.  Resumed at discharge.  chronic Venous insufficiency  Weight management -Continue Topamax.   Discharge Diagnoses:  Principal Problem:   Bilateral pulmonary embolism (HCC) Active Problems:   DM (diabetes mellitus), type 2 with renal complications (HCC)   Thrombocytopenia (HCC)   Coronary artery disease involving native coronary artery of native heart without angina pectoris   Mixed hyperlipidemia   Essential hypertension   Type 2 diabetes mellitus without complication, with long-term current use of insulin ([place])   30 Day Unplanned Readmission Risk Score    Flowsheet Row ED to Hosp-Admission (Current) from 05/19/2021 in [place] [facility]  30 Day Unplanned Readmission Risk Score (%) 16.22 Filed at 05/30/2021 0801       This score is the patient's risk of an unplanned readmission within 30 days of being discharged (0 -100%). The score is based on dignosis, age, lab data, medications, orders, and past utilization.   Low:  0-14.9   Medium: 15-21.9   High: 22-29.9   Extreme: 30 and above          Discharge Instructions:  Allergies as of 05/30/2021       Reactions   Penicillins Hives   Has patient had a PCN reaction causing immediate  rash, facial/tongue/throat swelling, SOB or lightheadedness with hypotension: No Has patient had a PCN reaction causing severe rash involving mucus membranes or skin necrosis: No Has patient had a PCN reaction that required hospitalization No Has patient had a PCN reaction occurring within the last 10  years: No If all of the above answers are "NO", then may proceed with Cephalosporin use.        Medication List     STOP taking these medications    Eliquis 2.5 MG Tabs tablet Generic drug: apixaban       TAKE these medications    allopurinol 100 MG tablet Commonly known as: ZYLOPRIM Take 100 mg by mouth daily.   atorvastatin 40 MG tablet Commonly known as: LIPITOR Take 40 mg by mouth at bedtime.   Empagliflozin-metFORMIN HCl 12.04-999 MG Tabs Take 1 tablet by mouth daily.   furosemide 40 MG tablet Commonly known as: LASIX Take 40 mg by mouth every morning.   liraglutide 18 MG/3ML Sopn Commonly known as: VICTOZA Inject 1.8 mg into the skin every morning.   lisinopril 2.5 MG tablet Commonly known as: ZESTRIL Take 2.5 mg by mouth daily.   metoprolol succinate 50 MG 24 hr tablet Commonly known as: TOPROL-XL Take 50 mg by mouth at bedtime. Take with or immediately following a meal.   polycarbophil 625 MG tablet Commonly known as: FIBERCON Take 625 mg by mouth in the morning, at noon, and at bedtime.   potassium chloride 10 MEQ tablet Commonly known as: KLOR-CON Take 10 mEq by mouth at bedtime.   tamsulosin 0.4 MG Caps capsule Commonly known as: FLOMAX Take 0.4 mg by mouth daily after breakfast.   topiramate 50 MG tablet Commonly known as: TOPAMAX Take 50 mg by mouth 2 (two) times daily.   Tyler Aas FlexTouch 200 UNIT/ML FlexTouch Pen Generic drug: insulin degludec Inject 26 Units into the skin at bedtime.   warfarin 7.5 MG tablet Commonly known as: COUMADIN Take 1 tablet (7.5 mg total) by mouth daily at 4 PM.         Follow-up Information     Volanda Napoleon, MD Follow up in 2 week(s).   Specialty: Oncology Contact information: [place] [id number] [place], FNP Follow up in 1 week(s).   Specialty: Nurse Practitioner Contact information: [address] [place]  [address] [phone]                 Allergies  Allergen Reactions   Penicillins Hives    Has patient had a PCN reaction causing immediate rash, facial/tongue/throat swelling, SOB or lightheadedness with hypotension: No Has patient had a PCN reaction causing severe rash involving mucus membranes or skin necrosis: No Has patient had a PCN reaction that required hospitalization No Has patient had a PCN reaction occurring within the last 10 years: No If all of the above answers are "NO", then may proceed with Cephalosporin use.     The results of significant diagnostics from this hospitalization (including imaging, microbiology, ancillary and laboratory) are listed below for reference.   Consultations:   Procedures/Studies: DG Chest 2 View  Result Date: 05/19/2021 CLINICAL DATA:  Shortness of breath EXAM: CHEST - 2 VIEW COMPARISON:  01/02/2015 FINDINGS: Bibasilar atelectasis. Heart is normal size. No effusions. No acute bony abnormality. IMPRESSION: Bibasilar atelectasis. Electronically Signed   By: Rolm Baptise M.D.   On: 05/19/2021 21:46   CT Angio  Chest Pulmonary Embolism (PE) W or WO Contrast  Result Date: 05/24/2021 CLINICAL DATA:  Recent pulmonary embolus with recent anticoagulants treatment. Shortness of breath. EXAM: CT ANGIOGRAPHY CHEST WITH CONTRAST TECHNIQUE: Multidetector CT imaging of the chest was performed using the standard protocol during bolus administration of intravenous contrast. Multiplanar CT image reconstructions and MIPs were obtained to evaluate the vascular anatomy. CONTRAST:  52mL OMNIPAQUE IOHEXOL 350 MG/ML SOLN COMPARISON:  May 20, 2021 CT angiogram chest FINDINGS: Cardiovascular: In comparison with most recent study, there remains pulmonary embolus on the right arising from the distal right main pulmonary artery with extension into several lower lobe pulmonary artery branches. There is pulmonary embolus at the origin of the right upper lobe pulmonary  artery. There is slightly less pulmonary embolus in the distal main pulmonary artery on the right anteriorly compared to most recent study. On the left, pulmonary embolus is again noted to arise from the distal main pulmonary artery with extension of pulmonary embolus in the several lower lobe pulmonary arteries, similar in appearance overall with marginally less pulmonary embolus per at the origin of the left lower lobe pulmonary artery compared to most recent study. No new foci of pulmonary embolus evident on either side. Right ventricle to left ventricle diameter ratio remains is 0.86, approaching threshold for right heart strain. Cardiac appearance is stable compared to recent prior study. No thoracic aortic aneurysm or dissection. Visualized great vessels appear unremarkable except for slight calcification at the origin of the left subclavian artery. Note that the right innominate and left common carotid arteries arise as a common trunk, an anatomic variant. There is aortic atherosclerosis. There are foci of coronary artery calcification. No pericardial effusion or pericardial thickening. Mediastinum/Nodes: Thyroid appears unremarkable. No evident thoracic adenopathy. No esophageal lesions are evident. Lungs/Pleura: There is stable scarring in the anterior aspect of the left upper lobe. There is airspace consolidation in a portion of the left lower lobe. There is bibasilar atelectasis as well. No appreciable pleural effusions. No pneumothorax. Trachea and major bronchial structures appear patent. Upper Abdomen: Visualized upper abdominal structures appear unremarkable. Musculoskeletal: There is degenerative change in the thoracic spine. No blastic or lytic bone lesions. No chest wall lesions. Review of the MIP images confirms the above findings. IMPRESSION: 1. There remains extensive pulmonary embolus remains with pulmonary emboli arising from each main pulmonary artery extending into multiple lower lobe  branches on each side and into the proximal right upper lobe branch on the right. Slightly less thrombus noted along the anterior aspect of the distal main pulmonary artery on the right and slightly less embolus in the proximal left lower lobe pulmonary artery compared to recent prior study. No new foci of pulmonary embolus. Stable cardiac silhouette with right ventricle to left ventricle diameter ratio of 0.86, near threshold for right heart strain. 2. Aortic atherosclerosis. Foci great vessel and coronary artery calcification. 3. Focal pneumonia left lower lobe. Areas of mild scarring and atelectasis as noted. No pleural effusions. 4.  No evident adenopathy. Aortic Atherosclerosis (ICD10-I70.0). These results will be called to the ordering clinician or representative by the Radiologist Assistant, and communication documented in the PACS or [employer]. Electronically Signed   By: Lowella Grip III M.D.   On: 05/24/2021 08:29   CT Angio Chest PE W and/or Wo Contrast  Result Date: 05/20/2021 CLINICAL DATA:  Positive D-dimer. Shortness of breath. Chest pain with deep inspiration. EXAM: CT ANGIOGRAPHY CHEST WITH CONTRAST TECHNIQUE: Multidetector CT imaging of the chest was performed  using the standard protocol during bolus administration of intravenous contrast. Multiplanar CT image reconstructions and MIPs were obtained to evaluate the vascular anatomy. CONTRAST:  74mL OMNIPAQUE IOHEXOL 350 MG/ML SOLN COMPARISON:  02/25/2014 FINDINGS: Cardiovascular: Heart size is at the upper limits of normal. There is coronary artery calcification and aortic atherosclerotic calcification. There is extensive bilateral pulmonary embolism. Right ventricular to left ventricular ratio is 0.86, approaching significance. Mediastinum/Nodes: No mass or lymphadenopathy. Lungs/Pleura: Upper lobes are clear except for a few small areas of scarring and emphysematous change. There is atelectasis and or infiltrate in the left lower  lobe. Minimal atelectasis at the right base. Upper Abdomen: Negative Musculoskeletal: Ordinary spinal degenerative changes. Review of the MIP images confirms the above findings. IMPRESSION: Extensive bilateral pulmonary embolism. Right ventricular to left ventricular ratio is 0.86, approaching the generally accepted level of 0.9 which would indicate right ventricular strain. Electronically Signed   By: Nelson Chimes M.D.   On: 05/20/2021 12:44   ECHOCARDIOGRAM COMPLETE  Result Date: 05/21/2021    ECHOCARDIOGRAM REPORT   Patient Name:   Travis Rollins Date of Exam: 05/21/2021 Medical Rec #:  [id number]          Height:       67.0 in Accession #:    [id number]         Weight:       274.0 lb Date of Birth:  1966-01-19           BSA:          2.312 m Patient Age:    55 years           BP:           118/82 mmHg Patient Gender: M                  HR:           90 bpm. Exam Location:  Inpatient Procedure: 2D Echo, Cardiac Doppler, Color Doppler and Intracardiac            Opacification Agent Indications:    Pulmonary embolism  History:        Patient has prior history of Echocardiogram examinations, most                 recent 09/11/2020. PAD; Risk Factors:Diabetes, Hypertension and                 Dyslipidemia.  Sonographer:    Maudry Mayhew MHA, RDMS, RVT, RDCS Referring Phys: [id number] [facility]  Sonographer Comments: Technically difficult study due to poor echo windows and patient is morbidly obese. Image acquisition challenging due to patient body habitus and Image acquisition challenging due to respiratory motion. IMPRESSIONS  1. Left ventricular ejection fraction, by estimation, is 60 to 65%. The left ventricle has normal function. The left ventricle has no regional wall motion abnormalities. Left ventricular diastolic parameters were normal.  2. Right ventricular systolic function is normal. The right ventricular size is normal.  3. The mitral valve is normal in structure. No evidence of mitral valve  regurgitation. No evidence of mitral stenosis.  4. The aortic valve has an indeterminant number of cusps. Aortic valve regurgitation is not visualized. No aortic stenosis is present.  5. The inferior vena cava is dilated in size with >50% respiratory variability, suggesting right atrial pressure of 8 mmHg. FINDINGS  Left Ventricle: Left ventricular ejection fraction, by estimation, is 60 to 65%. The left ventricle has normal function. The left ventricle  has no regional wall motion abnormalities. Definity contrast agent was given IV to delineate the left ventricular  endocardial borders. The left ventricular internal cavity size was normal in size. There is no left ventricular hypertrophy. Left ventricular diastolic parameters were normal. Right Ventricle: The right ventricular size is normal. Right ventricular systolic function is normal. Left Atrium: Left atrial size was normal in size. Right Atrium: Right atrial size was normal in size. Pericardium: There is no evidence of pericardial effusion. Mitral Valve: The mitral valve is normal in structure. No evidence of mitral valve regurgitation. No evidence of mitral valve stenosis. Tricuspid Valve: The tricuspid valve is normal in structure. Tricuspid valve regurgitation is trivial. No evidence of tricuspid stenosis. Aortic Valve: The aortic valve has an indeterminant number of cusps. Aortic valve regurgitation is not visualized. No aortic stenosis is present. Aortic valve mean gradient measures 1.0 mmHg. Aortic valve peak gradient measures 2.6 mmHg. Aortic valve area, by VTI measures 3.26 cm. Pulmonic Valve: The pulmonic valve was not well visualized. Pulmonic valve regurgitation is not visualized. No evidence of pulmonic stenosis. Aorta: The aortic root is normal in size and structure. Venous: The inferior vena cava is dilated in size with greater than 50% respiratory variability, suggesting right atrial pressure of 8 mmHg.  LEFT VENTRICLE PLAX 2D LVIDd:          4.50 cm  Diastology LVIDs:         3.50 cm  LV e' medial:    9.57 cm/s LV PW:         0.70 cm  LV E/e' medial:  8.9 LV IVS:        0.60 cm  LV e' lateral:   9.68 cm/s LVOT diam:     1.90 cm  LV E/e' lateral: 8.8 LV SV:         48 LV SV Index:   21 LVOT Area:     2.84 cm  RIGHT VENTRICLE RV S prime:     10.00 cm/s TAPSE (M-mode): 2.3 cm LEFT ATRIUM             Index       RIGHT ATRIUM           Index LA diam:        3.40 cm 1.47 cm/m  RA Area:     12.70 cm LA Vol (A2C):   27.7 ml 11.98 ml/m RA Volume:   31.30 ml  13.54 ml/m LA Vol (A4C):   47.3 ml 20.46 ml/m LA Biplane Vol: 36.6 ml 15.83 ml/m  AORTIC VALVE AV Area (Vmax):    3.22 cm AV Area (Vmean):   2.82 cm AV Area (VTI):     3.26 cm AV Vmax:           81.00 cm/s AV Vmean:          56.500 cm/s AV VTI:            0.147 m AV Peak Grad:      2.6 mmHg AV Mean Grad:      1.0 mmHg LVOT Vmax:         92.00 cm/s LVOT Vmean:        56.200 cm/s LVOT VTI:          0.169 m LVOT/AV VTI ratio: 1.15  AORTA Ao Root diam: 3.00 cm MITRAL VALVE MV Area (PHT): 5.38 cm    SHUNTS MV Decel Time: 141 msec    Systemic VTI:  0.17 m MV E  velocity: 85.10 cm/s  Systemic Diam: 1.90 cm MV A velocity: 79.00 cm/s MV E/A ratio:  1.08 Kirk Ruths MD Electronically signed by Kirk Ruths MD Signature Date/Time: 05/21/2021/12:19:21 PM    Final    CT Renal Stone Study  Result Date: 05/20/2021 CLINICAL DATA:  Flank pain with kidney stone suspected EXAM: CT ABDOMEN AND PELVIS WITHOUT CONTRAST TECHNIQUE: Multidetector CT imaging of the abdomen and pelvis was performed following the standard protocol without IV contrast. COMPARISON:  05/10/2014 FINDINGS: Lower chest: Coronary atherosclerosis. Atelectasis at the lung bases. Hepatobiliary: Regional steatosis in the right liver. No underlying mass or ductal dilatation seen.No evidence of biliary obstruction or stone. Pancreas: Unremarkable. Spleen: Unremarkable. Adrenals/Urinary Tract: Negative adrenals. No hydronephrosis or stone.  Unremarkable bladder. Stomach/Bowel:  No obstruction. No appendicitis. Vascular/Lymphatic: No acute vascular abnormality. IVC filter in expected position. Diffuse atheromatous calcification of the aorta and branch vessels. No mass or adenopathy. Reproductive:Seminal vesicle calcifications. Other: No ascites or pneumoperitoneum.  Fatty umbilical hernia. Musculoskeletal: No acute abnormalities. Prominent lumbar facet spurring with ankylosis at L3-4 and below. Multilevel thoracic spondylosis. IMPRESSION: 1. No acute finding.  No hydronephrosis or ureteral calculus. 2. Right hepatic steatosis. 3. Atherosclerosis including the coronary arteries. Electronically Signed   By: Monte Fantasia M.D.   On: 05/20/2021 10:02   CT VENOGRAM ABD/PEL  Result Date: 05/21/2021 CLINICAL DATA:  55 year old male with history of thromboembolic disease and previous IVC filter placed in 2015. Patient presents with bilateral pulmonary embolism. Patient is being evaluated for possible intervention for the pulmonary emboli. Need to evaluate the IVC and IVC filter for thrombus. EXAM: CT ABDOMEN AND PELVIS WITH CONTRAST (CT VENOGRAM PROTOCOL) TECHNIQUE: Multidetector CT imaging of the abdomen and pelvis was performed using the venogram protocol following bolus administration of intravenous contrast. CONTRAST:  156mL OMNIPAQUE IOHEXOL 350 MG/ML SOLN COMPARISON:  CT renal stone protocol 05/20/2021 and CTA chest 05/20/2021 FINDINGS: Lower chest: Small amount of compressive atelectasis in the right lower lobe. Slightly increased consolidation in the posterior left lower lobe compared to the recent chest CTA. Again noted are pulmonary emboli in the lower lobe pulmonary arteries. Difficult to exclude trace left pleural fluid. Hepatobiliary: Normal appearance of the liver and gallbladder. No biliary dilatation. Main portal venous system is patent. Pancreas: Unremarkable. No pancreatic ductal dilatation or surrounding inflammatory changes. Spleen:  Normal in size without focal abnormality. Adrenals/Urinary Tract: Normal adrenal glands. Normal appearance of both kidneys without hydronephrosis. Normal appearance of the urinary bladder. Stomach/Bowel: Normal appearance of the stomach and small bowel. Normal appearance of the colon and appendix. No acute inflammatory changes. No evidence for bowel obstruction. Vascular/Lymphatic: Atherosclerotic calcifications without aortic aneurysm. Main visceral arteries are patent. IVC filter is positioned below the renal veins. Minimal leg penetration involving a leg in the anterior IVC. IVC appears to be patent. There appears to be a small amount of thrombus associated with the top of the IVC filter near the hook. Overall, small amount of clot associated with the filter. No definite the thrombus involving the iliac veins. Renal veins are patent. Splenic vein is patent. No significant lymph node enlargement in the abdomen or pelvis. Reproductive: Prostate is unremarkable. Other: Negative for ascites. Negative for free air. Large periumbilical hernia containing fat. There is a large superficial calcification associated with this ventral hernia. Musculoskeletal: Multilevel degenerative facet disease. IMPRESSION: 1. IVC filter is well positioned. There is probably a small amount of clot associated with the top of the filter but the IVC is patent. No evidence for  thrombus within the iliac veins. 2. Known pulmonary embolism involving lower lobe pulmonary arteries. Slightly increased consolidation at the left lung base probably represents a combination of pulmonary infarct and atelectasis. Cannot exclude trace left pleural fluid. 3. Ventral hernia containing fat. Electronically Signed   By: Markus Daft M.D.   On: 05/21/2021 17:33   VAS [place] LOWER EXTREMITY VENOUS (DVT)  Result Date: 05/22/2021  Lower Venous DVT Study Patient Name:  Travis Rollins  Date of Exam:   05/20/2021 Medical Rec #: [id number]           Accession #:     [id number] Date of Birth: 1966/01/12            Patient Gender: M Patient Age:   54Y Exam [facility] Procedure:      VAS [place] LOWER EXTREMITY VENOUS (DVT) Referring Phys: [id number] Orma Flaming --------------------------------------------------------------------------------  Indications: Pulmonary embolism.  Comparison Study: no prior Performing Technologist: Archie Patten RVS  Examination Guidelines: A complete evaluation includes B-mode imaging, spectral Doppler, color Doppler, and power Doppler as needed of all accessible portions of each vessel. Bilateral testing is considered an integral part of a complete examination. Limited examinations for reoccurring indications may be performed as noted. The reflux portion of the exam is performed with the patient in reverse Trendelenburg.  +---------+---------------+---------+-----------+----------+-----------------+ RIGHT    CompressibilityPhasicitySpontaneityPropertiesThrombus Aging    +---------+---------------+---------+-----------+----------+-----------------+ CFV      Full           Yes      Yes                                    +---------+---------------+---------+-----------+----------+-----------------+ SFJ      Full                                                           +---------+---------------+---------+-----------+----------+-----------------+ FV Prox  Full                                                           +---------+---------------+---------+-----------+----------+-----------------+ FV Mid   Full                                                           +---------+---------------+---------+-----------+----------+-----------------+ FV DistalFull                                                           +---------+---------------+---------+-----------+----------+-----------------+ PFV      Full                                                            +---------+---------------+---------+-----------+----------+-----------------+  POP      Full           Yes      Yes                                    +---------+---------------+---------+-----------+----------+-----------------+ PTV      Full                                                           +---------+---------------+---------+-----------+----------+-----------------+ PERO     Full                                                           +---------+---------------+---------+-----------+----------+-----------------+ Gastroc  None                                         Age Indeterminate +---------+---------------+---------+-----------+----------+-----------------+   +---------+---------------+---------+-----------+----------+-------------------+ LEFT     CompressibilityPhasicitySpontaneityPropertiesThrombus Aging      +---------+---------------+---------+-----------+----------+-------------------+ CFV      Full           Yes      Yes                                      +---------+---------------+---------+-----------+----------+-------------------+ SFJ      Full                                                             +---------+---------------+---------+-----------+----------+-------------------+ FV Prox  Full                                                             +---------+---------------+---------+-----------+----------+-------------------+ FV Mid   Full                                                             +---------+---------------+---------+-----------+----------+-------------------+ FV DistalFull                                                             +---------+---------------+---------+-----------+----------+-------------------+ PFV      Full                                                             +---------+---------------+---------+-----------+----------+-------------------+  POP      Full            Yes      Yes                                      +---------+---------------+---------+-----------+----------+-------------------+ PTV                                                   Not well visualized +---------+---------------+---------+-----------+----------+-------------------+ PERO                                                  Not well visualized +---------+---------------+---------+-----------+----------+-------------------+     Summary: RIGHT: - Findings consistent with age indeterminate deep vein thrombosis involving the right gastrocnemius veins. - No cystic structure found in the popliteal fossa.  LEFT: - There is no evidence of deep vein thrombosis in the lower extremity.  - No cystic structure found in the popliteal fossa.  *See table(s) above for measurements and observations. Electronically signed by Ruta Hinds MD on 05/22/2021 at 8:52:14 AM.    Final    VAS [place] UPPER EXTREMITY VENOUS DUPLEX  Result Date: 05/22/2021 UPPER VENOUS STUDY  Patient Name:  Travis Rollins  Date of Exam:   05/22/2021 Medical Rec #: [id number]           Accession #:    [id number] Date of Birth: 04-03-1966            Patient Gender: M Patient Age:   37Y Exam Location:  [facility] Procedure:      VAS [place] UPPER EXTREMITY VENOUS DUPLEX Referring Phys: [id number] ADAM HENN --------------------------------------------------------------------------------  Indications: pulmonary embolism Comparison Study: no prior Performing Technologist: Archie Patten RVS  Examination Guidelines: A complete evaluation includes B-mode imaging, spectral Doppler, color Doppler, and power Doppler as needed of all accessible portions of each vessel. Bilateral testing is considered an integral part of a complete examination. Limited examinations for reoccurring indications may be performed as noted.  Right Findings: +----------+------------+---------+-----------+----------+-------+ RIGHT      CompressiblePhasicitySpontaneousPropertiesSummary +----------+------------+---------+-----------+----------+-------+ IJV           Full       Yes       Yes                      +----------+------------+---------+-----------+----------+-------+ Subclavian    Full       Yes       Yes                      +----------+------------+---------+-----------+----------+-------+ Axillary      Full       Yes       Yes                      +----------+------------+---------+-----------+----------+-------+ Brachial      Full       Yes       Yes                      +----------+------------+---------+-----------+----------+-------+ Radial  Full                                          +----------+------------+---------+-----------+----------+-------+ Ulnar         Full                                          +----------+------------+---------+-----------+----------+-------+ Cephalic      Full                                          +----------+------------+---------+-----------+----------+-------+ Basilic       Full                                          +----------+------------+---------+-----------+----------+-------+  Left Findings: +----------+------------+---------+-----------+----------+-------+ LEFT      CompressiblePhasicitySpontaneousPropertiesSummary +----------+------------+---------+-----------+----------+-------+ IJV           Full       Yes       Yes                      +----------+------------+---------+-----------+----------+-------+ Subclavian    Full       Yes       Yes                      +----------+------------+---------+-----------+----------+-------+ Axillary      Full       Yes       Yes                      +----------+------------+---------+-----------+----------+-------+ Brachial      Full       Yes       Yes                      +----------+------------+---------+-----------+----------+-------+  Radial        Full                                          +----------+------------+---------+-----------+----------+-------+ Ulnar         Full                                          +----------+------------+---------+-----------+----------+-------+ Cephalic      Full                                          +----------+------------+---------+-----------+----------+-------+ Basilic       Full                                          +----------+------------+---------+-----------+----------+-------+  Summary: No evidence of deep vein or superficial vein thrombosis involving the right and left  upper extremities.  *See table(s) above for measurements and observations.  Diagnosing physician: Harold Barban MD Electronically signed by Harold Barban MD on 05/22/2021 at 7:51:17 PM.    Final       Labs: BNP (last 3 results) Recent Labs    05/20/21 1300  BNP 84.1   Basic Metabolic Panel: Recent Labs  Lab 05/30/21 0220  NA 134*  K 4.3  CL 100  CO2 24  GLUCOSE 109*  BUN 36*  CREATININE 1.32*  CALCIUM 9.2  MG 2.6*   Liver Function Tests: No results for input(s): AST, ALT, ALKPHOS, BILITOT, PROT, ALBUMIN in the last 168 hours. No results for input(s): LIPASE, AMYLASE in the last 168 hours. No results for input(s): AMMONIA in the last 168 hours. CBC: Recent Labs  Lab 05/26/21 0144 05/27/21 0455 05/28/21 0142 05/29/21 0122 05/30/21 0220  WBC 4.0 3.6* 3.3* 3.4* 3.6*  HGB 14.4 14.6 15.4 15.4 15.3  HCT 44.4 45.5 47.6 46.9 47.2  MCV 97.8 97.4 96.9 97.1 96.9  PLT 97* 107* 102* 102* 107*   Cardiac Enzymes: No results for input(s): CKTOTAL, CKMB, CKMBINDEX, TROPONINI in the last 168 hours. BNP: Invalid input(s): POCBNP CBG: Recent Labs  Lab 05/29/21 0800 05/29/21 1131 05/29/21 1650 05/29/21 2036 05/30/21 0741  GLUCAP 328* 71 124* 123* 291*   D-Dimer No results for input(s): DDIMER in the last 72 hours. Hgb A1c No results for input(s): HGBA1C in the  last 72 hours. Lipid Profile No results for input(s): CHOL, HDL, LDLCALC, TRIG, CHOLHDL, LDLDIRECT in the last 72 hours. Thyroid function studies No results for input(s): TSH, T4TOTAL, T3FREE, THYROIDAB in the last 72 hours.  Invalid input(s): FREET3 Anemia work up No results for input(s): VITAMINB12, FOLATE, FERRITIN, TIBC, IRON, RETICCTPCT in the last 72 hours. Urinalysis    Component Value Date/Time   COLORURINE YELLOW 05/20/2021 1105   APPEARANCEUR CLEAR 05/20/2021 1105   LABSPEC 1.027 05/20/2021 1105   PHURINE 5.0 05/20/2021 1105   GLUCOSEU >=500 (A) 05/20/2021 1105   HGBUR NEGATIVE 05/20/2021 1105   BILIRUBINUR NEGATIVE 05/20/2021 1105   KETONESUR NEGATIVE 05/20/2021 1105   PROTEINUR NEGATIVE 05/20/2021 1105   UROBILINOGEN 0.2 05/10/2014 1423   NITRITE NEGATIVE 05/20/2021 1105   LEUKOCYTESUR NEGATIVE 05/20/2021 1105   Sepsis Labs Invalid input(s): PROCALCITONIN,  WBC,  LACTICIDVEN Microbiology Recent Results (from the past 240 hour(s))  Resp Panel by RT-PCR (Flu A&B, Covid) Nasopharyngeal Swab     Status: None   Collection Time: 05/20/21  1:08 PM   Specimen: Nasopharyngeal Swab; Nasopharyngeal(NP) swabs in vial transport medium  Result Value Ref Range Status   SARS Coronavirus 2 by RT PCR NEGATIVE NEGATIVE Final    Comment: (NOTE) SARS-CoV-2 target nucleic acids are NOT DETECTED.  The SARS-CoV-2 RNA is generally detectable in upper respiratory specimens during the acute phase of infection. The lowest concentration of SARS-CoV-2 viral copies this assay can detect is 138 copies/mL. A negative result does not preclude SARS-Cov-2 infection and should not be used as the sole basis for treatment or other patient management decisions. A negative result may occur with  improper specimen collection/handling, submission of specimen other than nasopharyngeal swab, presence of viral mutation(s) within the areas targeted by this assay, and inadequate number of viral copies(<138  copies/mL). A negative result must be combined with clinical observations, patient history, and epidemiological information. The expected result is Negative.  Fact Sheet for Patients:  EntrepreneurPulse.com.au  Fact Sheet for Healthcare Providers:  IncredibleEmployment.be  This test is no t yet  approved or cleared by the [place] and  has been authorized for detection and/or diagnosis of SARS-CoV-2 by FDA under an Emergency Use Authorization (EUA). This EUA will remain  in effect (meaning this test can be used) for the duration of the COVID-19 declaration under Section 564(b)(1) of the Act, 21 U.S.C.section 360bbb-3(b)(1), unless the authorization is terminated  or revoked sooner.       Influenza A by PCR NEGATIVE NEGATIVE Final   Influenza B by PCR NEGATIVE NEGATIVE Final    Comment: (NOTE) The Xpert Xpress SARS-CoV-2/FLU/RSV plus assay is intended as an aid in the diagnosis of influenza from Nasopharyngeal swab specimens and should not be used as a sole basis for treatment. Nasal washings and aspirates are unacceptable for Xpert Xpress SARS-CoV-2/FLU/RSV testing.  Fact Sheet for Patients: EntrepreneurPulse.com.au  Fact Sheet for Healthcare Providers: IncredibleEmployment.be  This test is not yet approved or cleared by the [place] FDA and has been authorized for detection and/or diagnosis of SARS-CoV-2 by FDA under an Emergency Use Authorization (EUA). This EUA will remain in effect (meaning this test can be used) for the duration of the COVID-19 declaration under Section 564(b)(1) of the Act, 21 U.S.C. section 360bbb-3(b)(1), unless the authorization is terminated or revoked.  Performed at [facility], [place] [address], [place],  [address]      Total time spend on discharging this patient, including the last patient exam, discussing the hospital stay, instructions for  ongoing care as it relates to all pertinent caregivers, as well as preparing the medical discharge records, prescriptions, and/or referrals as applicable, is 45 minutes.    Enzo Bi, MD  [facility] 05/30/2021, 9:23 AM

## 2021-05-30 NOTE — Progress Notes (Signed)
AVS provided and reviewed with pt. All questions answered. Pt discharged home with nephew.

## 2021-05-30 NOTE — Care Management Important Message (Signed)
Important Message  Patient Details  Name: Travis Rollins MRN: [id number] Date of Birth: 1966-09-01   Medicare Important Message Given:  Yes     Solyana Nonaka P Story 05/30/2021, 3:10 PM

## 2021-05-30 NOTE — Progress Notes (Addendum)
ANTICOAGULATION CONSULT NOTE - Follow Up Consult  Pharmacy Consult for Coumadin Indication: pulmonary embolus  Allergies  Allergen Reactions   Penicillins Hives    Has patient had a PCN reaction causing immediate rash, facial/tongue/throat swelling, SOB or lightheadedness with hypotension: No Has patient had a PCN reaction causing severe rash involving mucus membranes or skin necrosis: No Has patient had a PCN reaction that required hospitalization No Has patient had a PCN reaction occurring within the last 10 years: No If all of the above answers are "NO", then may proceed with Cephalosporin use.    Patient Measurements: Height: 5\' 7"  (170.2 cm) Weight: 124.3 kg (274 lb) IBW/kg (Calculated) : 66.1  Vital Signs: Temp: 98.5 F (36.9 C) (06/23 0607) Temp Source: Oral (06/23 0607) BP: 116/57 (06/23 0804) Pulse Rate: 87 (06/23 0804)  Labs: Recent Labs    05/28/21 0142 05/29/21 0122 05/30/21 0220  HGB 15.4 15.4 15.3  HCT 47.6 46.9 47.2  PLT 102* 102* 107*  LABPROT 23.2* 26.3* 30.9*  INR 2.1* 2.4* 3.0*  CREATININE  --   --  1.32*     Estimated Creatinine Clearance: 81.8 mL/min (A) (by C-G formula based on SCr of 1.32 mg/dL (H)).   Assessment: Anticoag: Low-dose Eliquis (LD 6/12) for hx of recurrent PE's. Chronic thrombocytopenia (BL 50-100). D-dimer >20; Antithrombin Act 90. Lupus anticoagulant negative. +age-indeterminat DVT. Coumadin preferred per heme>>starting 6/17. INR goal 3-3.5. Lupus anticoagulant + but this could be a false positive from the DOACs. Needs re-testing in 3 months. - CT Venogram revealing patent IVC and no significant findings - no thrombus in iliac veins - CT 6/13 and 6/17: Extensive PE remains - INR 3 today. Hgb 15.3 stable. Plts 107 low but stable.  Goal of Therapy:  INR 3-3.5 Monitor platelets by anticoagulation protocol: Yes   Plan:  Lovenox 120mg  SQ BID s/p 5d overlap with warfarin for VTE. Received dose this AM. Will d/c order now. With  rapid rise in INR, would recommend discharge on 7.5mg  daily Daily INR Education done with pt and famiily member   Aggie Douse S. Alford Highland, PharmD, BCPS Clinical Staff Pharmacist [email or web address]  Alford Highland, [place] 05/30/2021,8:27 AM

## 2021-05-30 NOTE — Plan of Care (Signed)

## 2021-05-31 ENCOUNTER — Telehealth: Payer: Self-pay | Admitting: *Deleted

## 2021-05-31 ENCOUNTER — Other Ambulatory Visit: Payer: Self-pay | Admitting: *Deleted

## 2021-05-31 DIAGNOSIS — I82532 Chronic embolism and thrombosis of left popliteal vein: Secondary | ICD-10-CM

## 2021-05-31 DIAGNOSIS — D696 Thrombocytopenia, unspecified: Secondary | ICD-10-CM

## 2021-05-31 DIAGNOSIS — I2699 Other pulmonary embolism without acute cor pulmonale: Secondary | ICD-10-CM

## 2021-05-31 NOTE — Telephone Encounter (Signed)
Per scheduling message Travis Rollins 05/31/21 -called patient and will be having labs @ [facility] 06/03/21 - patient to have them fax lab report to Dr. Marin Olp - patient confirmed.

## 2021-07-25 ENCOUNTER — Telehealth: Payer: Self-pay | Admitting: Internal Medicine

## 2021-07-25 NOTE — Telephone Encounter (Signed)
Received a new hem referral from [facility] for hx of pancytopenia. [name] has been cld and scheduled to see Dr. Julien Nordmann on 8/29 at 11:45am w/labs at 11:15am. Pt aware to arrive 15 minutes early.

## 2021-07-31 ENCOUNTER — Other Ambulatory Visit (HOSPITAL_COMMUNITY): Payer: Self-pay | Admitting: Student

## 2021-08-01 ENCOUNTER — Other Ambulatory Visit (HOSPITAL_COMMUNITY): Payer: Self-pay | Admitting: Student

## 2021-08-01 DIAGNOSIS — R0989 Other specified symptoms and signs involving the circulatory and respiratory systems: Secondary | ICD-10-CM

## 2021-08-02 ENCOUNTER — Other Ambulatory Visit: Payer: Self-pay | Admitting: Internal Medicine

## 2021-08-02 DIAGNOSIS — D61818 Other pancytopenia: Secondary | ICD-10-CM

## 2021-08-05 ENCOUNTER — Other Ambulatory Visit: Payer: Self-pay

## 2021-08-05 ENCOUNTER — Inpatient Hospital Stay: Payer: Medicare (Managed Care) | Attending: Internal Medicine | Admitting: Internal Medicine

## 2021-08-05 ENCOUNTER — Inpatient Hospital Stay: Payer: Medicare (Managed Care)

## 2021-08-05 VITALS — BP 119/80 | HR 79 | Temp 97.9°F | Resp 19 | Ht 67.0 in | Wt 261.3 lb

## 2021-08-05 DIAGNOSIS — Z86718 Personal history of other venous thrombosis and embolism: Secondary | ICD-10-CM | POA: Diagnosis not present

## 2021-08-05 DIAGNOSIS — R7402 Elevation of levels of lactic acid dehydrogenase (LDH): Secondary | ICD-10-CM | POA: Diagnosis not present

## 2021-08-05 DIAGNOSIS — D61818 Other pancytopenia: Secondary | ICD-10-CM

## 2021-08-05 DIAGNOSIS — I10 Essential (primary) hypertension: Secondary | ICD-10-CM | POA: Diagnosis not present

## 2021-08-05 DIAGNOSIS — D6862 Lupus anticoagulant syndrome: Secondary | ICD-10-CM | POA: Insufficient documentation

## 2021-08-05 DIAGNOSIS — D72819 Decreased white blood cell count, unspecified: Secondary | ICD-10-CM | POA: Diagnosis not present

## 2021-08-05 DIAGNOSIS — R7989 Other specified abnormal findings of blood chemistry: Secondary | ICD-10-CM | POA: Insufficient documentation

## 2021-08-05 DIAGNOSIS — G4730 Sleep apnea, unspecified: Secondary | ICD-10-CM | POA: Insufficient documentation

## 2021-08-05 DIAGNOSIS — E785 Hyperlipidemia, unspecified: Secondary | ICD-10-CM | POA: Diagnosis not present

## 2021-08-05 DIAGNOSIS — E119 Type 2 diabetes mellitus without complications: Secondary | ICD-10-CM | POA: Diagnosis not present

## 2021-08-05 DIAGNOSIS — D696 Thrombocytopenia, unspecified: Secondary | ICD-10-CM

## 2021-08-05 DIAGNOSIS — Z86711 Personal history of pulmonary embolism: Secondary | ICD-10-CM | POA: Insufficient documentation

## 2021-08-05 DIAGNOSIS — Z7901 Long term (current) use of anticoagulants: Secondary | ICD-10-CM | POA: Diagnosis not present

## 2021-08-05 LAB — CMP (CANCER CENTER ONLY)
ALT: 35 U/L (ref 0–44)
AST: 24 U/L (ref 15–41)
Albumin: 4 g/dL (ref 3.5–5.0)
Alkaline Phosphatase: 93 U/L (ref 38–126)
Anion gap: 11 (ref 5–15)
BUN: 32 mg/dL — ABNORMAL HIGH (ref 6–20)
CO2: 25 mmol/L (ref 22–32)
Calcium: 9.4 mg/dL (ref 8.9–10.3)
Chloride: 104 mmol/L (ref 98–111)
Creatinine: 1.33 mg/dL — ABNORMAL HIGH (ref 0.61–1.24)
GFR, Estimated: >60 mL/min (ref >60)
Glucose, Bld: 104 mg/dL — ABNORMAL HIGH (ref 70–99)
Potassium: 4.1 mmol/L (ref 3.5–5.1)
Sodium: 140 mmol/L (ref 135–145)
Total Bilirubin: 0.6 mg/dL (ref 0.3–1.2)
Total Protein: 8.6 g/dL — ABNORMAL HIGH (ref 6.5–8.1)

## 2021-08-05 LAB — CBC WITH DIFFERENTIAL (CANCER CENTER ONLY)
Abs Immature Granulocytes: 0.01 10*3/uL (ref 0.00–0.07)
Basophils Absolute: 0 10*3/uL (ref 0.0–0.1)
Basophils Relative: 1 %
Eosinophils Absolute: 0.1 10*3/uL (ref 0.0–0.5)
Eosinophils Relative: 2 %
HCT: 44.8 % (ref 39.0–52.0)
Hemoglobin: 14.5 g/dL (ref 13.0–17.0)
Immature Granulocytes: 0 %
Lymphocytes Relative: 34 %
Lymphs Abs: 1.2 10*3/uL (ref 0.7–4.0)
MCH: 31.9 pg (ref 26.0–34.0)
MCHC: 32.4 g/dL (ref 30.0–36.0)
MCV: 98.5 fL (ref 80.0–100.0)
Monocytes Absolute: 0.5 10*3/uL (ref 0.1–1.0)
Monocytes Relative: 14 %
Neutro Abs: 1.7 10*3/uL (ref 1.7–7.7)
Neutrophils Relative %: 49 %
Platelet Count: 99 10*3/uL — ABNORMAL LOW (ref 150–400)
RBC: 4.55 MIL/uL (ref 4.22–5.81)
RDW: 16.1 % — ABNORMAL HIGH (ref 11.5–15.5)
WBC Count: 3.5 10*3/uL — ABNORMAL LOW (ref 4.0–10.5)
nRBC: 0 % (ref 0.0–0.2)

## 2021-08-05 LAB — TSH: TSH: 1.099 u[IU]/mL (ref 0.320–4.118)

## 2021-08-05 LAB — HEPATITIS PANEL, ACUTE
HCV Ab: NONREACTIVE
Hep A IgM: NONREACTIVE
Hep B C IgM: NONREACTIVE
Hepatitis B Surface Ag: NONREACTIVE

## 2021-08-05 LAB — IRON AND TIBC
Iron: 110 ug/dL (ref 42–163)
Saturation Ratios: 30 % (ref 20–55)
TIBC: 364 ug/dL (ref 202–409)
UIBC: 254 ug/dL (ref 117–376)

## 2021-08-05 LAB — FOLATE: Folate: 16.2 ng/mL (ref >5.9)

## 2021-08-05 LAB — FERRITIN: Ferritin: 173 ng/mL (ref 24–336)

## 2021-08-05 LAB — LACTATE DEHYDROGENASE: LDH: 195 U/L — ABNORMAL HIGH (ref 98–192)

## 2021-08-05 LAB — VITAMIN B12: Vitamin B-12: 633 pg/mL (ref 180–914)

## 2021-08-05 LAB — HIV ANTIBODY (ROUTINE TESTING W REFLEX): HIV Screen 4th Generation wRfx: NONREACTIVE

## 2021-08-05 NOTE — Progress Notes (Signed)
[place] [phone]   [phone]  CONSULT NOTE  REFERRING PHYSICIAN: Volney Presser, FNP  REASON FOR CONSULTATION:  55 years old African-American male with leukocytopenia and thrombocytopenia  HPI Travis Rollins is a 55 y.o. male with past medical history significant for multiple medical problems including hypertension, dyslipidemia, diabetes mellitus, anemia, peripheral vascular disease as well as history of sleep apnea but this is improved after he started losing weight.  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  He was diagnosed with lupus anticoagulant and currently on treatment with Coumadin and followed by the Coumadin clinic at [facility].  The patient has been tried on several anticoagulant in the past but most recently on the treatment with the Coumadin.  He was seen recently by his primary care provider and blood work showed persistent mild leukocytopenia and thrombocytopenia.  He was referred to the clinic today for evaluation of this condition. When seen today he is feeling fine with no concerning complaints.  He denied having any current chest pain, shortness of breath, cough or hemoptysis.  He has no recurrent infection or bleeding, bruises or ecchymosis.  He has no nausea, vomiting, diarrhea or constipation.  He has no headache or visual changes. Family history significant for father with a stroke, diabetes mellitus and kidney disease.  Mother is alive and healthy. The patient is single and has no children.  He used to work as a Neurosurgeon.  He has no history of alcohol, smoking or drug abuse.  HPI  Past Medical History:  Diagnosis Date   Anemia    Carpal tunnel syndrome    Deep vein thrombosis ([place]) 07/2013, 10/2014, 03/16/2015   LLE (studies at [facility])   Depression    Diabetes mellitus    Hyperlipidemia    Hypertension    Obesity    Osteoarthritis    Peripheral  vascular disease ([place])    6 yrs ago, DVT in Lt knee/ groin   Pulmonary embolism ([facility])    hx of x 3 per patient; most recent 02/2014   Sinusitis    Sleep apnea     Past Surgical History:  Procedure Laterality Date   CARDIAC CATHETERIZATION N/A 08/10/2015   Procedure: Left Heart Cath and Coronary Angiography;  Surgeon: Adrian Prows, MD;  Location: [place] [facility];  Service: Cardiovascular;  Laterality: N/A;   CYSTOSCOPY WITH RETROGRADE PYELOGRAM, URETEROSCOPY AND STENT PLACEMENT Bilateral 01/03/2015   Procedure: CYSTOSCOPY WITH RETROGRADE PYELOGRAM, RIGHT URETEROSCOPY AND RIGHT STENT PLACEMENT;  Surgeon: Alexis Frock, MD;  Location: [facility];  Service: Urology;  Laterality: Bilateral;   CYSTOSCOPY WITH RETROGRADE PYELOGRAM, URETEROSCOPY AND STENT PLACEMENT Right 03/12/2016   Procedure: CYSTOSCOPY WITH RETROGRADE PYELOGRAM, URETEROSCOPY AND STENT PLACEMENT;  Surgeon: Alexis Frock, MD;  Location: [facility];  Service: Urology;  Laterality: Right;   ENDOVENOUS ABLATION SAPHENOUS VEIN W/ LASER Left 04/20/2018   endovenous laser ablation left greater saphenous vein by Tinnie Gens MD    HOLMIUM LASER APPLICATION Right 4/81/8563   Procedure: HOLMIUM LASER APPLICATION;  Surgeon: Alexis Frock, MD;  Location: [facility];  Service: Urology;  Laterality: Right;   HOLMIUM LASER APPLICATION Right 12/11/9700   Procedure: HOLMIUM LASER APPLICATION;  Surgeon: Alexis Frock, MD;  Location: [facility];  Service: Urology;  Laterality: Right;   IVC filter   07/2014    TONSILLECTOMY     tubes removd from ears      Family History  Problem Relation Age of Onset   Kidney disease Father    Diabetes Father    Colon cancer Neg Hx    Esophageal cancer Neg Hx    Rectal cancer Neg Hx    Stomach cancer Neg Hx     Social History Social History   Tobacco Use   Smoking status: Never   Smokeless tobacco: Never  Vaping Use   Vaping Use: Never used  Substance Use Topics   Alcohol use: Never   Drug use: No    Allergies   Allergen Reactions   Penicillins Hives    Has patient had a PCN reaction causing immediate rash, facial/tongue/throat swelling, SOB or lightheadedness with hypotension: No Has patient had a PCN reaction causing severe rash involving mucus membranes or skin necrosis: No Has patient had a PCN reaction that required hospitalization No Has patient had a PCN reaction occurring within the last 10 years: No If all of the above answers are "NO", then may proceed with Cephalosporin use.    Current Outpatient Medications  Medication Sig Dispense Refill   allopurinol (ZYLOPRIM) 100 MG tablet Take 100 mg by mouth daily.     atorvastatin (LIPITOR) 40 MG tablet Take 40 mg by mouth at bedtime.      Empagliflozin-metFORMIN HCl 12.04-999 MG TABS Take 1 tablet by mouth daily.     furosemide (LASIX) 40 MG tablet Take 40 mg by mouth every morning.      liraglutide (VICTOZA) 18 MG/3ML SOPN Inject 1.8 mg into the skin every morning.     lisinopril (PRINIVIL,ZESTRIL) 2.5 MG tablet Take 2.5 mg by mouth daily.     metoprolol succinate (TOPROL-XL) 50 MG 24 hr tablet Take 50 mg by mouth at bedtime. Take with or immediately following a meal.     polycarbophil (FIBERCON) 625 MG tablet Take 625 mg by mouth in the morning, at noon, and at bedtime.     potassium chloride (K-DUR) 10 MEQ tablet Take 10 mEq by mouth at bedtime.      tamsulosin (FLOMAX) 0.4 MG CAPS capsule Take 0.4 mg by mouth daily after breakfast.     topiramate (TOPAMAX) 50 MG tablet Take 50 mg by mouth 2 (two) times daily.     TRESIBA FLEXTOUCH 200 UNIT/ML SOPN Inject 26 Units into the skin at bedtime.  3   warfarin (COUMADIN) 7.5 MG tablet Take 1 tablet (7.5 mg total) by mouth daily at 4 PM. 30 tablet 2   No current facility-administered medications for this visit.    Review of Systems  Constitutional: negative Eyes: negative Ears, nose, mouth, throat, and face: negative Respiratory: positive for dyspnea on exertion Cardiovascular:  negative Gastrointestinal: negative Genitourinary:negative Integument/breast: negative Hematologic/lymphatic: negative Musculoskeletal:negative Neurological: negative Behavioral/Psych: negative Endocrine: negative Allergic/Immunologic: negative  Physical Exam  DGU:[id number], healthy, no distress, well nourished, well developed, and obese SKIN: skin color, texture, turgor are normal, no rashes or significant lesions HEAD: Normocephalic, No masses, lesions, tenderness or abnormalities EYES: normal, PERRLA, Conjunctiva are pink and non-injected EARS: External ears normal, Canals clear OROPHARYNX:no exudate, no erythema, and lips, buccal mucosa, and tongue normal  NECK: supple, no adenopathy, no JVD LYMPH:  no palpable lymphadenopathy, no hepatosplenomegaly LUNGS: clear to auscultation , and palpation HEART: regular rate & rhythm, no murmurs, and no gallops ABDOMEN:abdomen soft, non-tender, obese, normal bowel sounds, and no masses or organomegaly BACK: Back symmetric, no curvature., No CVA tenderness EXTREMITIES:no joint deformities, effusion, or inflammation, no edema  NEURO: alert & oriented x 3 with fluent  speech, no focal motor/sensory deficits  PERFORMANCE STATUS: ECOG 1  LABORATORY DATA: Lab Results  Component Value Date   WBC 3.5 (L) 08/05/2021   HGB 14.5 08/05/2021   HCT 44.8 08/05/2021   MCV 98.5 08/05/2021   PLT 99 (L) 08/05/2021      Chemistry      Component Value Date/Time   NA 140 08/05/2021 1026   K 4.1 08/05/2021 1026   CL 104 08/05/2021 1026   CO2 25 08/05/2021 1026   BUN 32 (H) 08/05/2021 1026   CREATININE 1.33 (H) 08/05/2021 1026      Component Value Date/Time   CALCIUM 9.4 08/05/2021 1026   ALKPHOS 93 08/05/2021 1026   AST 24 08/05/2021 1026   ALT 35 08/05/2021 1026   BILITOT 0.6 08/05/2021 1026       RADIOGRAPHIC STUDIES: No results found.  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  The patient was treated with several anticoagulant in the past and he is currently on Coumadin and followed by the Coumadin clinic with his primary care provider.  He was referred to me today for evaluation of mild leukocytopenia and thrombocytopenia noted on recent blood work.  This is likely drug-induced but other abnormalities could not be completely excluded.  The culprit drugs are allopurinol and Lipitor.   PLAN: I order several studies today for evaluation of his condition.  I order several studies including repeat CBC which showed the persistent mild leukocytopenia and thrombocytopenia. Additional studies showed mildly elevated LDH, normal iron study and ferritin, normal serum folate and vitamin B12 level.  He also has normal TSH and mild elevation of serum creatinine. Other studies including ANA, hepatitis panel, HIV and rheumatoid factors are still pending. The patient is currently asymptomatic and I recommended for him to continue on observation with repeat CBC in 3 months for further evaluation of his condition. If he has worsening leukocytopenia and thrombocytopenia at that time, I may consider The patient for a bone marrow biopsy and aspirate. He was advised to call immediately if he has any other concerning symptoms in the interval. The patient voices understanding of current disease status and treatment options and is in agreement with the current care plan.  All questions were answered. The patient knows to call the clinic with any problems, questions or concerns. We can certainly see the patient much sooner if necessary.  Thank you so much for allowing me to participate in the care of Travis Rollins. I will continue to follow up the patient with you and assist in his care.  The total time spent in the appointment was 60 minutes.  Disclaimer: This note was dictated with voice  recognition software. Similar sounding words can inadvertently be transcribed and may not be corrected upon review.   Eilleen Kempf August 05, 2021, 11:58 AM

## 2021-08-06 ENCOUNTER — Inpatient Hospital Stay: Payer: Medicare (Managed Care) | Admitting: Primary Care

## 2021-08-06 ENCOUNTER — Telehealth: Payer: Self-pay | Admitting: *Deleted

## 2021-08-06 LAB — RHEUMATOID FACTOR: Rheumatoid fact SerPl-aCnc: <10 IU/mL (ref <14.0)

## 2021-08-06 NOTE — Telephone Encounter (Signed)
Faxed office notes to new PCP, Cipriano Mile, FNP

## 2021-08-13 ENCOUNTER — Other Ambulatory Visit: Payer: Self-pay

## 2021-08-13 ENCOUNTER — Ambulatory Visit (INDEPENDENT_AMBULATORY_CARE_PROVIDER_SITE_OTHER): Payer: Medicare (Managed Care) | Admitting: Primary Care

## 2021-08-13 ENCOUNTER — Encounter: Payer: Self-pay | Admitting: Primary Care

## 2021-08-13 VITALS — BP 114/80 | HR 85 | Temp 98.2°F | Ht 67.0 in | Wt 263.0 lb

## 2021-08-13 DIAGNOSIS — R0602 Shortness of breath: Secondary | ICD-10-CM | POA: Diagnosis not present

## 2021-08-13 DIAGNOSIS — J449 Chronic obstructive pulmonary disease, unspecified: Secondary | ICD-10-CM | POA: Diagnosis not present

## 2021-08-13 DIAGNOSIS — D72819 Decreased white blood cell count, unspecified: Secondary | ICD-10-CM | POA: Diagnosis not present

## 2021-08-13 DIAGNOSIS — I2699 Other pulmonary embolism without acute cor pulmonale: Secondary | ICD-10-CM | POA: Diagnosis not present

## 2021-08-13 NOTE — Progress Notes (Signed)
_0  ID: Travis Rollins, male    DOB: 04-27-1966, 55 y.o.   MRN: [id number]  Chief Complaint  Patient presents with   Follow-up    Patient reports that he is better since being in the hospital.     Referring provider: Boyce Medici, FNP  HPI: 55 year old male, never smoked. PMH significant for bilateral pulmonary embolism, chronic DVT LLE, CAD, HTN, type 2 diabetes, obesity.   Hospital Course 05/19/21-05/30/21:  For full details, please see H&P, progress notes, consult notes and ancillary notes.  Briefly,  Travis Rollins is a 55 year old gentleman history of recurrent PEs was on Coumadin, Xarelto, Pradaxa, and currently on apixaban presented to the ED with left lower back pain and shortness of breath which has been worsening x5 days.     Patient status post recent COVID-19 booster 05/10/2021.  CT angiogram chest performed with extensive bilateral PE with right ventricular strain.  CT renal stone protocol with hepatic steatosis otherwise unremarkable.  Lower extremity Dopplers done with age  indeterminate DVT of right gastrocnemius vein.     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 -CT angiogram done with extensive bilateral PE with right ventricular strain. -Lower extremity Dopplers with age-indeterminate right gastrocnemius vein DVT. -Hypercoagulable in progress -PCCM, Hematology, IR consulted. --Per oncology, Recommendations for transtion to coumadin with bridging with Lovenox.  Goal INR 3 to 3.5. --Pt was discharged when INR reached 3, and was discharged on warfarin 7.5 mg daily, per pharm rec.  TOC asked to arrange for pt to have outpatient followup and INR check on 06/03/21. -PCCM recommends repeat TTE in 3 months  Chronic thrombocytopenia -Patient with no overt  bleeding. --home statin and allopurinol held on admission.  Resumed at discharge.   08/13/2021- Interim hx  Patient presents today for hospital follow-up. He was admitted from 05/19/21-05/30/21 for recurrent bilateral PE with heart strain. He is on coumadin 7.40m daily [facility] His next apt is on September 19th with them. He saw oncology on 08/05/21 for follow-up of his mild leukocytopenia and thrombocytopenia. He has normal TSH and iron studies/ferritin. HIV, hepatitis panel and RF were negative. Since he is asympatomatic their plan is to repeat CBC in 3 months, if worsening leukocytopenia and thrombocytopenia may consider bone marrow biopsy. He states that he has a history of sleep apnea, he never had an official sleep study but reports his snoring and sleep got better with weight loss. Denies shortness of breath, cough, chest tightness, wheezing, bleeding, hemoptysis.    Allergies  Allergen Reactions   Penicillins Hives    Has patient had a PCN reaction causing immediate rash, facial/tongue/throat swelling, SOB or lightheadedness with hypotension: No Has patient had a PCN reaction causing severe rash involving mucus membranes or skin necrosis: No Has patient had a PCN reaction that required hospitalization No Has patient had a PCN reaction occurring within the last 10 years: No If all of the above answers are "NO", then may proceed with Cephalosporin use.     There is no immunization history on file for this patient.  Past Medical History:  Diagnosis Date   Anemia    Carpal tunnel syndrome    [place] 10[place]2014, 03/16/2015   LLE ([facility]   Depression    Diabetes mellitus  Hyperlipidemia    Hypertension    Obesity    Osteoarthritis    Peripheral vascular disease ([place])    6 yrs ago, DVT in Lt knee/ groin   Pulmonary embolism ([facility])    hx of x 3 per patient; most recent 02/2014   Sinusitis    Sleep apnea      Tobacco History: Social History   Tobacco Use  Smoking Status Never  Smokeless Tobacco Never   Counseling given: Not Answered   Outpatient Medications Prior to Visit  Medication Sig Dispense Refill   allopurinol (ZYLOPRIM) 100 MG tablet Take 100 mg by mouth daily.     atorvastatin (LIPITOR) 40 MG tablet Take 40 mg by mouth at bedtime.      Empagliflozin-metFORMIN HCl 12.04-999 MG TABS Take 1 tablet by mouth daily.     furosemide (LASIX) 40 MG tablet Take 40 mg by mouth every morning.      liraglutide (VICTOZA) 18 MG/3ML SOPN Inject 1.8 mg into the skin every morning.     lisinopril (PRINIVIL,ZESTRIL) 2.5 MG tablet Take 2.5 mg by mouth daily.     metoprolol succinate (TOPROL-XL) 50 MG 24 hr tablet Take 50 mg by mouth at bedtime. Take with or immediately following a meal.     polycarbophil (FIBERCON) 625 MG tablet Take 625 mg by mouth in the morning, at noon, and at bedtime.     potassium chloride (K-DUR) 10 MEQ tablet Take 10 mEq by mouth at bedtime.      tamsulosin (FLOMAX) 0.4 MG CAPS capsule Take 0.4 mg by mouth daily after breakfast.     topiramate (TOPAMAX) 50 MG tablet Take 50 mg by mouth 2 (two) times daily.     TRESIBA FLEXTOUCH 200 UNIT/ML SOPN Inject 26 Units into the skin at bedtime.  3   warfarin (COUMADIN) 7.5 MG tablet Take 1 tablet (7.5 mg total) by mouth daily at 4 PM. 30 tablet 2   No facility-administered medications prior to visit.      Review of Systems  Review of Systems  Constitutional:  Negative for chills, diaphoresis and fatigue.  HENT: Negative.    Respiratory:  Negative for cough, chest tightness, shortness of breath and wheezing.        Mild DOE  Cardiovascular: Negative.  Negative for chest pain.  Psychiatric/Behavioral: Negative.      Physical Exam  BP 114/80 (BP Location: Left Arm, Patient Position: Sitting, Cuff Size: Large)   Pulse 85   Temp 98.2 F (36.8 C) (Oral)   Ht _0  (1.702 m)   Wt 263 lb (119.3 kg)   SpO2 98%   BMI  41.19 kg/m  Physical Exam Constitutional:      Appearance: Normal appearance. He is obese.  HENT:     Head: Normocephalic and atraumatic.  Cardiovascular:     Rate and Rhythm: Normal rate and regular rhythm.  Pulmonary:     Effort: Pulmonary effort is normal.     Breath sounds: Normal breath sounds. No wheezing, rhonchi or rales.  Neurological:     General: No focal deficit present.     Mental Status: He is alert and oriented to person, place, and time. Mental status is at baseline.  Psychiatric:        Mood and Affect: Mood normal.        Behavior: Behavior normal.        Thought Content: Thought content normal.        Judgment: Judgment normal.  Lab Results:  CBC    Component Value Date/Time   WBC 3.5 (L) 08/05/2021 1026   WBC 3.6 (L) 05/30/2021 0220   RBC 4.55 08/05/2021 1026   HGB 14.5 08/05/2021 1026   HCT 44.8 08/05/2021 1026   PLT 99 (L) 08/05/2021 1026   MCV 98.5 08/05/2021 1026   MCH 31.9 08/05/2021 1026   MCHC 32.4 08/05/2021 1026   RDW 16.1 (H) 08/05/2021 1026   LYMPHSABS 1.2 08/05/2021 1026   MONOABS 0.5 08/05/2021 1026   EOSABS 0.1 08/05/2021 1026   BASOSABS 0.0 08/05/2021 1026    BMET    Component Value Date/Time   NA 140 08/05/2021 1026   K 4.1 08/05/2021 1026   CL 104 08/05/2021 1026   CO2 25 08/05/2021 1026   GLUCOSE 104 (H) 08/05/2021 1026   BUN 32 (H) 08/05/2021 1026   CREATININE 1.33 (H) 08/05/2021 1026   CALCIUM 9.4 08/05/2021 1026   GFRNONAA >60 08/05/2021 1026   GFRAA 52 (L) 03/06/2016 1335    BNP    Component Value Date/Time   BNP 31.4 05/20/2021 1300    ProBNP    Component Value Date/Time   PROBNP 578.6 (H) 02/25/2014 0108    Imaging: VAS [place] ABI WITH/WO TBI  Result Date: 08/14/2021  LOWER EXTREMITY DOPPLER STUDY Patient Name:  AVIR DERUITER  Date of Exam:   08/14/2021 Medical Rec #: [id number]           Accession #:    [id number] Date of Birth: 27-Jan-1966            Patient Gender: M Patient Age:   68 years Exam  Location:  [place] Procedure:      VAS [place] ABI WITH/WO TBI Referring Phys: Rachel Moulds STOWE --------------------------------------------------------------------------------  Indications: Dimished pulses & LLE sensitive to touch. High Risk Factors: Hypertension, hyperlipidemia, Diabetes, no history of                    smoking. Other Factors: Lupus anticoagulant positive, HX of multiple LLE DVTs, venous                stasis disease.  Comparison Study: No previous studies Performing Technologist: Rogelia Rohrer RVT/RDMS  Examination Guidelines: A complete evaluation includes at minimum, Doppler waveform signals and systolic blood pressure reading at the level of bilateral brachial, anterior tibial, and posterior tibial arteries, when vessel segments are accessible. Bilateral testing is considered an integral part of a complete examination. Photoelectric Plethysmograph (PPG) waveforms and toe systolic pressure readings are included as required and additional duplex testing as needed. Limited examinations for reoccurring indications may be performed as noted.  ABI Findings: +---------+------------------+-----+---------+--------+ Right    Rt Pressure (mmHg)IndexWaveform Comment  +---------+------------------+-----+---------+--------+ Brachial 127                    triphasic         +---------+------------------+-----+---------+--------+ PTA                             triphasic>254 [place]  +---------+------------------+-----+---------+--------+ DP       166               1.31 triphasic         +---------+------------------+-----+---------+--------+ Great Toe111               0.87 Normal            +---------+------------------+-----+---------+--------+ +---------+------------------+-----+---------+-------+ Left  Lt Pressure (mmHg)IndexWaveform Comment +---------+------------------+-----+---------+-------+ Brachial 116                    triphasic         +---------+------------------+-----+---------+-------+ PTA                             triphasic>254 [place] +---------+------------------+-----+---------+-------+ DP                              biphasic >254 [place] +---------+------------------+-----+---------+-------+ Great Toe67                0.53 Abnormal         +---------+------------------+-----+---------+-------+ +-------+-----------+-----------+------------+------------+ ABI/TBIToday's ABIToday's TBIPrevious ABIPrevious TBI +-------+-----------+-----------+------------+------------+ Right  [place]         0.87                                +-------+-----------+-----------+------------+------------+ Left   [place]         0.53                                +-------+-----------+-----------+------------+------------+  Arterial wall calcification precludes accurate ankle pressures and ABIs.  Summary: Right: Resting right ankle-brachial index indicates noncompressible right lower extremity arteries. TBI indicates mild small vessel disease. Left: Resting left ankle-brachial index indicates noncompressible left lower extremity arteries. TBI indicates moderate small vessel disease.  *See table(s) above for measurements and observations.  Electronically signed by Monica Martinez MD on 08/14/2021 at 2:30:44 PM.    Final      Assessment & Plan:   Bilateral pulmonary embolism ([place]) - No acute symptoms today. VSS. Denies sob, cough, chest discomfort, bleeding issues or hemoptysis. Tolerating anticoagulation. He is no longer on oxygen.  - Continue Warfarin as directed; INR goal 3-3.5  - Need repeat echo in 2-4 weeks, per patient he will have cardiology order this - FU in 3 months with Dr. Silas Flood   Leukocytopenia - Following with hematology/oncology.  TSH and iron studies/ferritin were normal. HIV, hepatitis panel and RF were negative. Since he is asympatomatic their plan is to repeat CBC in 3 months, if worsening leukocytopenia and  thrombocytopenia may consider bone marrow biopsy.     Martyn Ehrich, NP 08/25/2021

## 2021-08-13 NOTE — Patient Instructions (Addendum)
Recommendations: - Continue Coumadin as directed - Follow up with Cardiology end of Septmber, you need a repeat echocardiogram in 2-4 weeks with cardiology (please have them order) - Follow up with oncology in December   Orders: - Pulmonary function testing first available   Follow-up: - 3 months with Dr. Silas Flood or sooner if needed

## 2021-08-14 ENCOUNTER — Ambulatory Visit (HOSPITAL_COMMUNITY)
Admission: RE | Admit: 2021-08-14 | Discharge: 2021-08-14 | Disposition: A | Discharge status: Home / Self Care | Payer: Medicare (Managed Care) | Source: Ambulatory Visit | Attending: Student | Admitting: Student

## 2021-08-14 DIAGNOSIS — R0989 Other specified symptoms and signs involving the circulatory and respiratory systems: Secondary | ICD-10-CM

## 2021-08-14 LAB — ANTINUCLEAR ANTIBODIES, IFA: ANA Ab, IFA: NEGATIVE

## 2021-08-23 ENCOUNTER — Ambulatory Visit: Payer: Medicare (Managed Care)

## 2021-08-23 ENCOUNTER — Other Ambulatory Visit: Payer: Self-pay

## 2021-08-25 NOTE — Assessment & Plan Note (Addendum)
-   No acute symptoms today. VSS. Denies sob, cough, chest discomfort, bleeding issues or hemoptysis. Tolerating anticoagulation. He is no longer on oxygen.  - Continue Warfarin as directed; INR goal 3-3.5  - Need repeat echo in 2-4 weeks, per patient he will have cardiology order this - FU in 3 months with Dr. Silas Flood

## 2021-08-25 NOTE — Assessment & Plan Note (Signed)
-  Following with hematology/oncology.  TSH and iron studies/ferritin were normal. HIV, hepatitis panel and RF were negative. Since he is asympatomatic their plan is to repeat CBC in 3 months, if worsening leukocytopenia and thrombocytopenia may consider bone marrow biopsy.

## 2021-09-03 NOTE — Progress Notes (Signed)
Patient referred by Boyce Medici, FNP for coronary artery disease  Subjective:   Travis Rollins, male    DOB: 19-May-1966, 55 y.o.   MRN: [id number]   Chief Complaint  Patient presents with   Coronary Artery Disease   Follow-up     HPI  55 y.o. African-American male with hypertension, type 2 DM, h/o recurrent DVT/PE, now s/p IVC filter placement and on long-term anticoagulation, mild nonobstructive coronary artery disease.  Patient is originally from [place], where he had his first PE in 2005.  He had his last PE in 2015, at which time, IVC filter was placed.  It does not appear that he is seen a hematologist.  He was previously on warfarin, then on Xarelto, and now on Eliquis.  He has not had any PE since 2016.  He underwent coronary angiogram in 2016 that showed mild nonobstructive coronary artery disease.  Unfortunately, patient was hospitalized 05/2021 with severe back pain and shortness of breath and was found to have recurrent bilateral pulmonary embolism with borderline RV strain.  Hematology/oncology recommended transition from Lovenox to Coumadin with bridging with Lovenox, since recurrent PEs have happened while on various DOAC's.  Goal INR was recommended 3-3.5. He was also diagnosed with lupus anticoagulant, potentially cause for his recurrent PE.   Given his persistent leukocytopenia and thrombocytopenia, he is seeing Dr. Julien Nordmann. If he has worsening leukocytopenia thrombocytopenia in future, he may consider bone marrow biopsy and aspirate.  Current Outpatient Medications on File Prior to Visit  Medication Sig Dispense Refill   allopurinol (ZYLOPRIM) 100 MG tablet Take 100 mg by mouth daily.     atorvastatin (LIPITOR) 40 MG tablet Take 40 mg by mouth at bedtime.      Empagliflozin-metFORMIN HCl 12.04-999 MG TABS Take 1 tablet by mouth daily.     furosemide (LASIX) 40 MG tablet Take 40 mg by mouth every morning.      liraglutide (VICTOZA) 18 MG/3ML SOPN Inject 1.8 mg  into the skin every morning.     lisinopril (PRINIVIL,ZESTRIL) 2.5 MG tablet Take 2.5 mg by mouth daily.     metoprolol succinate (TOPROL-XL) 50 MG 24 hr tablet Take 50 mg by mouth at bedtime. Take with or immediately following a meal.     polycarbophil (FIBERCON) 625 MG tablet Take 625 mg by mouth in the morning, at noon, and at bedtime.     potassium chloride (K-DUR) 10 MEQ tablet Take 10 mEq by mouth at bedtime.      tamsulosin (FLOMAX) 0.4 MG CAPS capsule Take 0.4 mg by mouth daily after breakfast.     topiramate (TOPAMAX) 50 MG tablet Take 50 mg by mouth 2 (two) times daily.     TRESIBA FLEXTOUCH 200 UNIT/ML SOPN Inject 26 Units into the skin at bedtime.  3   triamcinolone cream (KENALOG) 0.1 % SMARTSIG:Sparingly Topical Twice Daily PRN     warfarin (COUMADIN) 7.5 MG tablet Take 1 tablet (7.5 mg total) by mouth daily at 4 PM. 30 tablet 2   No current facility-administered medications on file prior to visit.    Cardiovascular and other pertinent studies:  EKG 09/04/2021: Sinus rhythm 82 bpm  Nonspecific T-abnormality  CTA 05/24/2021: 1. There remains extensive pulmonary embolus remains with pulmonary emboli arising from each main pulmonary artery extending into multiple lower lobe branches on each side and into the proximal right upper lobe branch on the right. Slightly less thrombus noted along the anterior aspect of the distal main pulmonary  artery on the right and slightly less embolus in the proximal left lower lobe pulmonary artery compared to recent prior study. No new foci of pulmonary embolus. Stable cardiac silhouette with right ventricle to left ventricle diameter ratio of 0.86, near threshold for right heart strain.   2. Aortic atherosclerosis. Foci great vessel and coronary artery calcification.   3. Focal pneumonia left lower lobe. Areas of mild scarring and atelectasis as noted. No pleural effusions.   4.  No evident adenopathy.   Aortic Atherosclerosis  (ICD10-I70.0).   These results will be called to the ordering clinician or representative by the Radiologist Assistant, and communication documented in the PACS or [employer].  Echocardiogram 05/21/2021:  1. Left ventricular ejection fraction, by estimation, is 60 to 65%. The  left ventricle has normal function. The left ventricle has no regional  wall motion abnormalities. Left ventricular diastolic parameters were  normal.   2. Right ventricular systolic function is normal. The right ventricular  size is normal.   3. The mitral valve is normal in structure. No evidence of mitral valve  regurgitation. No evidence of mitral stenosis.   4. The aortic valve has an indeterminant number of cusps. Aortic valve  regurgitation is not visualized. No aortic stenosis is present.   5. The inferior vena cava is dilated in size with >50% respiratory  variability, suggesting right atrial pressure of 8 mmHg.   Echocardiogram 09/11/2020:  Mildly depressed LV systolic function with visual EF 45-50%. Left  ventricle cavity is normal in size. Normal global wall motion. Doppler  evidence of grade I (impaired) diastolic dysfunction, normal LAP.  No significant valvular heart disease.  Compared to prior study dated 2015: LVEF is now 45-50% compared to 50-55%.   EKG 09/03/2020: Sinus rhythm 82 bpm  Normal EKG  LE venous US 2019: Left: Abnormalities consistent with the sequela of a prior venous  obstructive process, with findings that appear chronic/long standing in  nature are identified in the Popliteal vein. Successful vein closure.   Cardiac catheterization 2016: LAD mid calcified 30%, proximal Cx 20%. Codominant Cx. Normal LVEF. IVC filter in situ and appears to be stable and normally placed with hook for retreival facing SVC  CTA chest 2015: 1. Bilateral proximal occlusive pulmonary emboli extending into the  left and right upper and lower lobes. Overall clot burden is severe.  2.  Abnormal right ventricular to left ventricular ratio consistent  with right heart strain.    Recent labs: 08/05/2021: Glucose 104, BUN/Cr 32/1.33. EGFR >60. Na/K 140/4.1. Rest of the CMP normal H/H 14/44. MCV 98. Platelets 99 HbA1C 7.6% TSH 1.0 normal Lipid panel N/A  04/24/2020: HbA1C 7.5%  01/29/2020: Glucose 104, BUN/Cr 26/1.16. EGFR 83. Na/K 143/4.8. Rest of the CMP normal H/H 15/46. MCV 95. Platelets 105 Chol 154, TG 130, HDL 45, LDL 86   Review of Systems  Cardiovascular:  Negative for chest pain, dyspnea on exertion, leg swelling, palpitations and syncope.        Vitals:   09/04/21 0930  BP: 112/76  Pulse: 82  Temp: (!) 97.2 F (36.2 C)  SpO2: 99%     Body mass index is 41.04 kg/m. Filed Weights   09/04/21 0930  Weight: 262 lb (118.8 kg)     Objective:   Physical Exam Vitals and nursing note reviewed.  Constitutional:      General: He is not in acute distress. Neck:     Vascular: No JVD.  Cardiovascular:     Rate and  Rhythm: Normal rate and regular rhythm.     Heart sounds: Normal heart sounds. No murmur heard. Pulmonary:     Effort: Pulmonary effort is normal.     Breath sounds: Normal breath sounds. No wheezing or rales.  Skin:    Comments: Chronic venous stasis changes b/l LE         Assessment & Recommendations:   55 y.o. African-American male with hypertension, type 2 DM, h/o recurrent DVT/PE, now s/p IVC filter placement and on long-term anticoagulation, mild nonobstructive coronary artery disease.   Coronary artery disease: Minimal CAD seen on coronary angiogram in 2016.  Currently, he is asymptomatic from angina or angina equivalent standpoint.  Continue aggressive risk factor modification.  Not on aspirin given ongoing use of warfarin for long-term anticoagulation.  Given his complex history of venous thromboembolism and need for lifelong anticoagulation, I would be more conservative with management of CAD and recommend invasive  management only if absolutely necessary.  No indication for any invasive management at this time.   Recurrent PE: Was reported with anticoagulant.  Currently on warfarin with goal INR 3.0-3.5. Continue follow-up with hematology/oncology.  Type 2 DM: Continue management as per PCP.   Venous insufficiency: No overt symptoms, other than lower extremity discoloration.  Recommend compression stockings.  F/u in 1 year    Nigel Mormon, MD Pager: [phone] Office: [phone]

## 2021-09-04 ENCOUNTER — Other Ambulatory Visit: Payer: Self-pay

## 2021-09-04 ENCOUNTER — Ambulatory Visit: Payer: Medicare (Managed Care) | Admitting: Cardiology

## 2021-09-04 ENCOUNTER — Encounter: Payer: Self-pay | Admitting: Cardiology

## 2021-09-04 VITALS — BP 112/76 | HR 82 | Temp 97.2°F | Ht 67.0 in | Wt 262.0 lb

## 2021-09-04 DIAGNOSIS — I2699 Other pulmonary embolism without acute cor pulmonale: Secondary | ICD-10-CM

## 2021-09-04 DIAGNOSIS — I2510 Atherosclerotic heart disease of native coronary artery without angina pectoris: Secondary | ICD-10-CM

## 2021-09-06 LAB — PULMONARY FUNCTION TEST
DL/VA % pred: 113 %
DL/VA: 5 ml/min/mmHg/L
DLCO cor % pred: 80 %
DLCO cor: 20.91 ml/min/mmHg
DLCO unc % pred: 80 %
DLCO unc: 20.91 ml/min/mmHg
FEF 25-75 Post: 3.26 L/sec
FEF 25-75 Pre: 4.13 L/sec
FEF2575-%Change-Post: -20 %
FEF2575-%Pred-Post: 113 %
FEF2575-%Pred-Pre: 142 %
FEV1-%Change-Post: -2 %
FEV1-%Pred-Post: 83 %
FEV1-%Pred-Pre: 85 %
FEV1-Post: 2.44 L
FEV1-Pre: 2.5 L
FEV1FVC-%Change-Post: 1 %
FEV1FVC-%Pred-Pre: 109 %
FEV6-%Change-Post: -3 %
FEV6-%Pred-Post: 77 %
FEV6-%Pred-Pre: 80 %
FEV6-Post: 2.77 L
FEV6-Pre: 2.88 L
FEV6FVC-%Pred-Post: 103 %
FEV6FVC-%Pred-Pre: 103 %
FVC-%Change-Post: -3 %
FVC-%Pred-Post: 75 %
FVC-%Pred-Pre: 78 %
FVC-Post: 2.77 L
FVC-Pre: 2.88 L
Post FEV1/FVC ratio: 88 %
Post FEV6/FVC ratio: 100 %
Pre FEV1/FVC ratio: 87 %
Pre FEV6/FVC Ratio: 100 %
RV % pred: 67 %
RV: 1.32 L
TLC % pred: 65 %
TLC: 4.18 L

## 2021-09-06 NOTE — Progress Notes (Signed)
Please let patient know PFTs showed mild restriction likely d/t body habitus. No evidence of  obstructive airway disease such as COPD or asthma. Diffusion capacity was normal. No concerning findings related to PE

## 2021-10-08 ENCOUNTER — Encounter (HOSPITAL_BASED_OUTPATIENT_CLINIC_OR_DEPARTMENT_OTHER): Payer: Medicare (Managed Care) | Admitting: Internal Medicine

## 2022-09-01 ENCOUNTER — Ambulatory Visit: Payer: Medicare (Managed Care) | Admitting: Internal Medicine

## 2022-09-01 ENCOUNTER — Ambulatory Visit: Payer: Medicare Other

## 2022-09-01 VITALS — BP 125/82 | HR 93 | Temp 97.9°F | Resp 12 | Ht 67.0 in | Wt 265.8 lb

## 2022-09-01 DIAGNOSIS — I2510 Atherosclerotic heart disease of native coronary artery without angina pectoris: Secondary | ICD-10-CM

## 2022-09-01 DIAGNOSIS — E119 Type 2 diabetes mellitus without complications: Secondary | ICD-10-CM

## 2022-09-01 DIAGNOSIS — I2699 Other pulmonary embolism without acute cor pulmonale: Secondary | ICD-10-CM

## 2022-09-01 DIAGNOSIS — I10 Essential (primary) hypertension: Secondary | ICD-10-CM

## 2022-09-01 NOTE — Progress Notes (Signed)
Patient referred by Cipriano Mile, NP for coronary artery disease  Subjective:   Travis Rollins, male    DOB: 1966/07/12, 56 y.o.   MRN: [id number]  Chief Complaint  Patient presents with   Coronary Artery Disease   Follow-up    1 year   HPI  56 y.o. African-American male with hypertension, type 2 DM, h/o recurrent DVT/PE, now s/p IVC filter placement and on long-term anticoagulation, mild nonobstructive coronary artery disease.  Patient is originally from [place], where he had his first PE in 2005.  He had his last PE in 2015, at which time, IVC filter was placed.  It does not appear that he is seen a hematologist.  He was previously on warfarin, then on Xarelto, and now on Eliquis.  He has not had any PE since 2016.  He underwent coronary angiogram in 2016 that showed mild nonobstructive coronary artery disease.  Unfortunately, patient was hospitalized 05/2021 with severe back pain and shortness of breath and was found to have recurrent bilateral pulmonary embolism with borderline RV strain.  Hematology/oncology recommended transition from Lovenox to Coumadin with bridging with Lovenox, since recurrent PEs have happened while on various DOAC's.  Goal INR was recommended 3-3.5. He was also diagnosed with lupus anticoagulant, potentially cause for his recurrent PE.   Given his persistent leukocytopenia and thrombocytopenia, he is seeing Dr. Julien Nordmann. If he has worsening leukocytopenia thrombocytopenia in future, he may consider bone marrow biopsy and aspirate.  He presents today for 1 year follow-up. He has made changes to his diet and has cut out fried food and does not add salt to his food. However, he did mention that he drinks 1-2 "shots" of olive oil every day. He is exercising daily, mostly walking. He denies chest pain, shortness of breath, palpitations, dizziness, or syncope.  Current Outpatient Medications on File Prior to Visit  Medication Sig Dispense Refill   allopurinol  (ZYLOPRIM) 100 MG tablet Take 100 mg by mouth daily.     atorvastatin (LIPITOR) 40 MG tablet Take 40 mg by mouth at bedtime.      Empagliflozin-metFORMIN HCl 12.04-999 MG TABS Take 1 tablet by mouth daily. Synjardy     furosemide (LASIX) 40 MG tablet Take 40 mg by mouth every morning.      ketoconazole (NIZORAL) 2 % cream Apply topically daily.     liraglutide (VICTOZA) 18 MG/3ML SOPN Inject 1.8 mg into the skin every morning.     lisinopril (PRINIVIL,ZESTRIL) 2.5 MG tablet Take 2.5 mg by mouth daily.     metoprolol succinate (TOPROL-XL) 50 MG 24 hr tablet Take 50 mg by mouth at bedtime. Take with or immediately following a meal.     polycarbophil (FIBERCON) 625 MG tablet Take 625 mg by mouth in the morning, at noon, and at bedtime.     potassium chloride (K-DUR) 10 MEQ tablet Take 10 mEq by mouth at bedtime.      tamsulosin (FLOMAX) 0.4 MG CAPS capsule Take 0.4 mg by mouth daily after breakfast.     terbinafine (LAMISIL) 250 MG tablet Take 250 mg by mouth daily.     topiramate (TOPAMAX) 50 MG tablet Take 50 mg by mouth 2 (two) times daily.     TRESIBA FLEXTOUCH 200 UNIT/ML SOPN Inject 26 Units into the skin at bedtime.  3   triamcinolone cream (KENALOG) 0.1 % SMARTSIG:Sparingly Topical Twice Daily PRN     warfarin (COUMADIN) 7.5 MG tablet Take 1 tablet (7.5 mg total) by  mouth daily at 4 PM. 30 tablet 2   No current facility-administered medications on file prior to visit.   Cardiovascular and other pertinent studies:  EKG 09/01/22: Sinus rhythm at rate of 82 bpm.  Normal axis.  Nonspecific T abnormality.  No change from previous EKG on 09/04/2021.  Vas [place] ABI 08/14/21: Arterial wall calcification precludes accurate ankle pressures and ABIs. Summary: Right: Resting right ankle-brachial index indicates noncompressible right lower extremity arteries. TBI indicates mild small vessel disease. Left: Resting left ankle-brachial index indicates noncompressible left lower extremity arteries. TBI  indicates moderate small vessel disease.  EKG 09/04/2021: Sinus rhythm 82 bpm  Nonspecific T-abnormality  CTA 05/24/2021: 1. There remains extensive pulmonary embolus remains with pulmonary emboli arising from each main pulmonary artery extending into multiple lower lobe branches on each side and into the proximal right upper lobe branch on the right. Slightly less thrombus noted along the anterior aspect of the distal main pulmonary artery on the right and slightly less embolus in the proximal left lower lobe pulmonary artery compared to recent prior study. No new foci of pulmonary embolus. Stable cardiac silhouette with right ventricle to left ventricle diameter ratio of 0.86, near threshold for right heart strain.   2. Aortic atherosclerosis. Foci great vessel and coronary artery calcification.   3. Focal pneumonia left lower lobe. Areas of mild scarring and atelectasis as noted. No pleural effusions.   4.  No evident adenopathy.   Aortic Atherosclerosis (ICD10-I70.0).   These results will be called to the ordering clinician or representative by the Radiologist Assistant, and communication documented in the PACS or [employer].  Echocardiogram 05/21/2021:  1. Left ventricular ejection fraction, by estimation, is 60 to 65%. The  left ventricle has normal function. The left ventricle has no regional  wall motion abnormalities. Left ventricular diastolic parameters were  normal.   2. Right ventricular systolic function is normal. The right ventricular  size is normal.   3. The mitral valve is normal in structure. No evidence of mitral valve  regurgitation. No evidence of mitral stenosis.   4. The aortic valve has an indeterminant number of cusps. Aortic valve  regurgitation is not visualized. No aortic stenosis is present.   5. The inferior vena cava is dilated in size with >50% respiratory  variability, suggesting right atrial pressure of 8 mmHg.   Echocardiogram  09/11/2020:  Mildly depressed LV systolic function with visual EF 45-50%. Left  ventricle cavity is normal in size. Normal global wall motion. Doppler  evidence of grade I (impaired) diastolic dysfunction, normal LAP.  No significant valvular heart disease.  Compared to prior study dated 2015: LVEF is now 45-50% compared to 50-55%.   EKG 09/03/2020: Sinus rhythm 82 bpm  Normal EKG  LE venous US 2019: Left: Abnormalities consistent with the sequela of a prior venous  obstructive process, with findings that appear chronic/long standing in  nature are identified in the Popliteal vein. Successful vein closure.   Cardiac catheterization 2016: LAD mid calcified 30%, proximal Cx 20%. Codominant Cx. Normal LVEF. IVC filter in situ and appears to be stable and normally placed with hook for retreival facing SVC  CTA chest 2015: 1. Bilateral proximal occlusive pulmonary emboli extending into the  left and right upper and lower lobes. Overall clot burden is severe.  2. Abnormal right ventricular to left ventricular ratio consistent  with right heart strain.   Recent labs: 08/05/2021: Glucose 104, BUN/Cr 32/1.33. EGFR >60. Na/K 140/4.1. Rest of the CMP normal  H/H 14/44. MCV 98. Platelets 99 HbA1C 7.6% TSH 1.0 normal Lipid panel N/A  04/24/2020: HbA1C 7.5%  01/29/2020: Glucose 104, BUN/Cr 26/1.16. EGFR 83. Na/K 143/4.8. Rest of the CMP normal H/H 15/46. MCV 95. Platelets 105 Chol 154, TG 130, HDL 45, LDL 86   Review of Systems  Cardiovascular:  Negative for chest pain, dyspnea on exertion, leg swelling, palpitations and syncope.  Respiratory:  Negative for shortness of breath.   Neurological:  Negative for dizziness.   Vitals:   09/01/22 0940  BP: 125/82  Pulse: 93  Resp: 12  Temp: 97.9 F (36.6 C)  SpO2: 97%    Body mass index is 41.63 kg/m. Filed Weights   09/01/22 0940  Weight: 265 lb 12.8 oz (120.6 kg)     Objective:   Physical Exam Vitals and nursing note  reviewed.  Constitutional:      General: He is not in acute distress. Neck:     Vascular: No JVD.  Cardiovascular:     Rate and Rhythm: Normal rate and regular rhythm.     Heart sounds: Normal heart sounds. No murmur heard. Pulmonary:     Effort: Pulmonary effort is normal.     Breath sounds: Normal breath sounds. No wheezing or rales.  Skin:    Comments: Chronic venous stasis changes b/l LE  Neurological:     Mental Status: He is alert.    Assessment & Recommendations:   56 y.o. African-American male with hypertension, type 2 DM, h/o recurrent DVT/PE, now s/p IVC filter placement and on long-term anticoagulation, mild nonobstructive coronary artery disease.  Coronary artery disease: Minimal CAD seen on coronary angiogram in 2016.  Currently, he is asymptomatic from angina or angina equivalent standpoint.  Continue aggressive risk factor modification.  Not on aspirin given ongoing use of warfarin for long-term anticoagulation.  Given his complex history of venous thromboembolism and need for lifelong anticoagulation, I would be more conservative with management of CAD and recommend invasive management only if absolutely necessary.  No indication for any invasive management at this time.  We did discuss proper heart healthy diet and stopping daily intake of olive oil. He reports understanding. Blood pressure is well controlled. No changes to medication regimen. Advised him to notify office if recurrence of chest pain or anginal equivalent.   Recurrent PE: Currently on warfarin with goal INR 3.0-3.5. Continue follow-up with hematology/oncology.  Type 2 DM: Continue management as per PCP.   Venous insufficiency: No overt symptoms, other than lower extremity discoloration.  Recommend compression stockings.  F/u in 1 year or sooner if needed.    Ernst Spell, NP Pager: [phone] Office: [phone]

## 2022-09-04 ENCOUNTER — Ambulatory Visit: Payer: Medicare Other | Admitting: Cardiology

## 2023-05-13 ENCOUNTER — Other Ambulatory Visit: Payer: Self-pay | Admitting: Student

## 2023-05-13 DIAGNOSIS — J439 Emphysema, unspecified: Secondary | ICD-10-CM

## 2023-05-20 ENCOUNTER — Ambulatory Visit
Admission: RE | Admit: 2023-05-20 | Discharge: 2023-05-20 | Disposition: A | Discharge status: Home / Self Care | Payer: 59 | Source: Ambulatory Visit | Attending: Student | Admitting: Student

## 2023-05-20 ENCOUNTER — Other Ambulatory Visit: Payer: Medicare (Managed Care)

## 2023-05-20 DIAGNOSIS — J439 Emphysema, unspecified: Secondary | ICD-10-CM

## 2023-05-26 ENCOUNTER — Other Ambulatory Visit: Payer: Medicare (Managed Care)

## 2023-08-19 ENCOUNTER — Encounter: Payer: Self-pay | Admitting: Internal Medicine

## 2023-08-24 ENCOUNTER — Encounter: Payer: Self-pay | Admitting: Internal Medicine

## 2023-08-26 NOTE — Telephone Encounter (Signed)
This encounter was created in error - please disregard.

## 2023-09-14 ENCOUNTER — Other Ambulatory Visit: Payer: Self-pay | Admitting: *Deleted

## 2023-09-14 DIAGNOSIS — J9601 Acute respiratory failure with hypoxia: Secondary | ICD-10-CM

## 2023-09-15 ENCOUNTER — Ambulatory Visit: Payer: 59 | Admitting: Pulmonary Disease

## 2023-09-15 DIAGNOSIS — J9601 Acute respiratory failure with hypoxia: Secondary | ICD-10-CM

## 2023-09-15 LAB — PULMONARY FUNCTION TEST
DL/VA % pred: 111 %
DL/VA: 4.89 ml/min/mmHg/L
DLCO cor % pred: 76 %
DLCO cor: 19.61 ml/min/mmHg
DLCO unc % pred: 76 %
DLCO unc: 19.61 ml/min/mmHg
FEF 25-75 Post: 1.8 L/s
FEF 25-75 Pre: 3.58 L/s
FEF2575-%Change-Post: -49 %
FEF2575-%Pred-Post: 61 %
FEF2575-%Pred-Pre: 123 %
FEV1-%Change-Post: -9 %
FEV1-%Pred-Post: 63 %
FEV1-%Pred-Pre: 70 %
FEV1-Post: 2.15 L
FEV1-Pre: 2.38 L
FEV1FVC-%Change-Post: -4 %
FEV1FVC-%Pred-Pre: 114 %
FEV6-%Change-Post: -5 %
FEV6-%Pred-Post: 60 %
FEV6-%Pred-Pre: 64 %
FEV6-Post: 2.56 L
FEV6-Pre: 2.71 L
FEV6FVC-%Change-Post: 0 %
FEV6FVC-%Pred-Post: 104 %
FEV6FVC-%Pred-Pre: 104 %
FVC-%Change-Post: -5 %
FVC-%Pred-Post: 58 %
FVC-%Pred-Pre: 61 %
FVC-Post: 2.57 L
FVC-Pre: 2.71 L
Post FEV1/FVC ratio: 84 %
Post FEV6/FVC ratio: 100 %
Pre FEV1/FVC ratio: 88 %
Pre FEV6/FVC Ratio: 100 %
RV % pred: 128 %
RV: 2.56 L
TLC % pred: 82 %
TLC: 5.27 L

## 2023-09-15 NOTE — Patient Instructions (Signed)
Full PFT performed today. °

## 2023-09-15 NOTE — Progress Notes (Signed)
Full PFT performed today. °

## 2023-09-16 ENCOUNTER — Encounter: Payer: Self-pay | Admitting: Pulmonary Disease

## 2023-09-16 ENCOUNTER — Ambulatory Visit: Payer: 59 | Admitting: Pulmonary Disease

## 2023-09-16 VITALS — BP 124/78 | HR 85 | Temp 98.2°F | Ht 67.0 in | Wt 262.0 lb

## 2023-09-16 DIAGNOSIS — I2699 Other pulmonary embolism without acute cor pulmonale: Secondary | ICD-10-CM

## 2023-09-16 NOTE — Progress Notes (Signed)
@Patient  ID: Travis Rollins, male    DOB: [birth year], 57 y.o.   MRN: [id number]  Chief Complaint  Patient presents with   Follow-up    Breathing is doing well and no new cos'.     Referring provider: Hillery Aldo, NP  HPI:   57 y.o. man with history of recurrent pulmonary embolism with failure of multiple anticoagulants who has last seen by pulmonary 2 years ago lost to follow-up.  Not seen hematology in 2 years lost to follow-up.  Most recent pulmonary note 08/2021 reviewed.  Most recent hematology note 07/2021 reviewed.  Most recent cardiology note x 2 reviewed.  Patient with multiple PEs for send 2005.  Was on warfarin and Xarelto.  Another PE on anticoagulation and 2015.  IVC filter placed.  Sounds like transition to Eliquis at that time.  Unfortunately had another PE 2022 on Eliquis.  Decision was made to keep him on warfarin with a higher INR goal of 3-3.5.  He states his PCP is monitoring this and managing this.  I see no record of this I can view PCP records.  Has not seen his hematologist in over 2 years.  He does report dose changes over time.  But he cannot tell me what his most recent INR was when asked.  TTE 05/2021 at time of most recent PE with normal RV size and function, normal RA size, reassuring with trivial to no tricuspid regurgitation.  Questionaires / Pulmonary Flowsheets:   ACT:      No data to display          MMRC:     No data to display          Epworth:      No data to display          Tests:   FENO:  No results found for: "NITRICOXIDE"  PFT:    Latest Ref Rng & Units 09/15/2023   12:55 PM 08/23/2021   11:36 AM  PFT Results  FVC-Pre L 2.71  2.88   FVC-Predicted Pre % 61  78   FVC-Post L 2.57  2.77   FVC-Predicted Post % 58  75   Pre FEV1/FVC % % 88  87   Post FEV1/FCV % % 84  88   FEV1-Pre L 2.38  2.50   FEV1-Predicted Pre % 70  85   FEV1-Post L 2.15  2.44   DLCO uncorrected ml/min/mmHg 19.61  20.91   DLCO UNC% % 76  80    DLCO corrected ml/min/mmHg 19.61  20.91   DLCO COR %Predicted % 76  80   DLVA Predicted % 111  113   TLC L 5.27  4.18   TLC % Predicted % 82  65   RV % Predicted % 128  67   Personally reviewed interpreted spirometry suggestive of moderate restriction versus air trapping.  No bronchodilator response.  Lung volumes consistent with air trapping.  DLCO is within normal limits and likely underestimated given volume used to calculate this is more than 20% less than TLC measured  WALK:      No data to display          Imaging: Personally reviewed and as per EMR discussion in this note No results found.  Lab Results: Personally reviewed CBC    Component Value Date/Time   WBC 3.5 (L) 08/05/2021 1026   WBC 3.6 (L) 05/30/2021 0220   RBC 4.55 08/05/2021 1026   HGB 14.5 08/05/2021 1026  HCT 44.8 08/05/2021 1026   PLT 99 (L) 08/05/2021 1026   MCV 98.5 08/05/2021 1026   MCH 31.9 08/05/2021 1026   MCHC 32.4 08/05/2021 1026   RDW 16.1 (H) 08/05/2021 1026   LYMPHSABS 1.2 08/05/2021 1026   MONOABS 0.5 08/05/2021 1026   EOSABS 0.1 08/05/2021 1026   BASOSABS 0.0 08/05/2021 1026    BMET    Component Value Date/Time   NA 140 08/05/2021 1026   K 4.1 08/05/2021 1026   CL 104 08/05/2021 1026   CO2 25 08/05/2021 1026   GLUCOSE 104 (H) 08/05/2021 1026   BUN 32 (H) 08/05/2021 1026   CREATININE 1.33 (H) 08/05/2021 1026   CALCIUM 9.4 08/05/2021 1026   GFRNONAA >60 08/05/2021 1026   GFRAA 52 (L) 03/06/2016 1335    BNP    Component Value Date/Time   BNP 31.4 05/20/2021 1300    ProBNP    Component Value Date/Time   PROBNP 578.6 (H) 02/25/2014 0108    Specialty Problems       Pulmonary Problems   Acute respiratory failure with hypoxia (HCC)    Allergies  Allergen Reactions   Penicillins Hives    Has patient had a PCN reaction causing immediate rash, facial/tongue/throat swelling, SOB or lightheadedness with hypotension: No Has patient had a PCN reaction causing severe  rash involving mucus membranes or skin necrosis: No Has patient had a PCN reaction that required hospitalization No Has patient had a PCN reaction occurring within the last 10 years: No If all of the above answers are "NO", then may proceed with Cephalosporin use.    Immunization History  Administered Date(s) Administered   Influenza-Unspecified 09/10/2023    Past Medical History:  Diagnosis Date   Anemia    Carpal tunnel syndrome    Deep vein thrombosis (HCC) 07/2013, 10/2014, 03/16/2015   LLE (studies at [facility])   Depression    Diabetes mellitus    Hyperlipidemia    Hypertension    Obesity    Osteoarthritis    Peripheral vascular disease (HCC)    6 yrs ago, DVT in Lt knee/ groin   Pulmonary embolism (HCC)    hx of x 3 per patient; most recent 02/2014   Sinusitis    Sleep apnea     Tobacco History: Social History   Tobacco Use  Smoking Status Never  Smokeless Tobacco Never   Counseling given: Not Answered   Continue to not smoke  Outpatient Encounter Medications as of 09/16/2023  Medication Sig   allopurinol (ZYLOPRIM) 100 MG tablet Take 100 mg by mouth daily.   atorvastatin (LIPITOR) 40 MG tablet Take 40 mg by mouth at bedtime.    ezetimibe (ZETIA) 10 MG tablet Take 10 mg by mouth daily.   furosemide (LASIX) 40 MG tablet Take 40 mg by mouth every morning.    lisinopril (PRINIVIL,ZESTRIL) 2.5 MG tablet Take 2.5 mg by mouth daily.   metoprolol succinate (TOPROL-XL) 50 MG 24 hr tablet Take 50 mg by mouth at bedtime. Take with or immediately following a meal.   polycarbophil (FIBERCON) 625 MG tablet Take 625 mg by mouth in the morning, at noon, and at bedtime.   potassium chloride (K-DUR) 10 MEQ tablet Take 10 mEq by mouth at bedtime.    SYNJARDY XR 12.04-999 MG TB24 Take 1 tablet by mouth 2 (two) times daily.   tamsulosin (FLOMAX) 0.4 MG CAPS capsule Take 0.4 mg by mouth daily after breakfast.   TRESIBA FLEXTOUCH 200 UNIT/ML SOPN Inject 26 Units  into the skin at  bedtime.   triamcinolone cream (KENALOG) 0.1 % SMARTSIG:Sparingly Topical Twice Daily PRN   TRULICITY 3 MG/0.5ML SOPN As directed   warfarin (COUMADIN) 4 MG tablet Take 8 mg by mouth daily.   [DISCONTINUED] Empagliflozin-metFORMIN HCl 12.04-999 MG TABS Take 1 tablet by mouth daily. Synjardy   [DISCONTINUED] ketoconazole (NIZORAL) 2 % cream Apply topically daily.   [DISCONTINUED] liraglutide (VICTOZA) 18 MG/3ML SOPN Inject 1.8 mg into the skin every morning.   [DISCONTINUED] terbinafine (LAMISIL) 250 MG tablet Take 250 mg by mouth daily.   [DISCONTINUED] topiramate (TOPAMAX) 50 MG tablet Take 50 mg by mouth 2 (two) times daily.   [DISCONTINUED] warfarin (COUMADIN) 7.5 MG tablet Take 1 tablet (7.5 mg total) by mouth daily at 4 PM.   No facility-administered encounter medications on file as of 09/16/2023.     Review of Systems  Review of Systems  No chest pain no orthopnea or PND comprehensive review of systems otherwise negative Physical Exam  BP 124/78 (BP Location: Right Arm, Cuff Size: Large)   Pulse 85   Temp 98.2 F (36.8 C) (Oral)   Ht 5\' 7"  (1.702 m)   Wt 262 lb (118.8 kg)   SpO2 98%   BMI 41.04 kg/m   Wt Readings from Last 5 Encounters:  09/16/23 262 lb (118.8 kg)  09/01/22 265 lb 12.8 oz (120.6 kg)  09/04/21 262 lb (118.8 kg)  08/13/21 263 lb (119.3 kg)  08/05/21 261 lb 4.8 oz (118.5 kg)    BMI Readings from Last 5 Encounters:  09/16/23 41.04 kg/m  09/01/22 41.63 kg/m  09/04/21 41.04 kg/m  08/13/21 41.19 kg/m  08/05/21 40.93 kg/m     Physical Exam General: Sitting in chair, no acute distress Eyes: EOMI, icterus Neck: Supple, no JVP Pulmonary: Distant, normal work of breathing, clear Cardiovascular: Regular rate and rhythm, no murmur Abdomen: Nondistended.  Bowel sounds present MSK: No synovitis, no joint effusion Neuro: Normal gait, no weakness Psych:  odd affect, normal mood   Assessment & Plan:   Chronic recurrent pulmonary embolism: With  failure of warfarin and Xarelto Eliquis in the past.  Current INR goal 3-3.5 per hematology after discharge in 2022.  Has not followed up with hematology since 07/2021.  He states his PCP is monitoring his INR.  He does state there is variation in the dose.  I cannot verify this as I do not have access to his PCP records.  Recommend ongoing hematology follow-up, will message his hematologist to see if they can arrange follow-up.  Has upcoming cardiology evaluation hopefully echocardiogram performed to see if there is any sign of chronic thromboembolic pulmonary hypertension.  Cough: Comes and goes.  Productive sputum.  Air trapping on recent PFTs.  Suspect possible element of cough variant asthma.  Symptom burden is low does not warrant treatment at this time.  Consider ICS/LABA therapy in the future if worsens.   Return in about 1 year (around 09/15/2024) for f/u Dr. Judeth Horn.   Karren Burly, MD 09/16/2023   This appointment required 41 minutes of patient care (this includes precharting, chart review, review of results, face-to-face care, etc.).

## 2023-09-16 NOTE — Patient Instructions (Addendum)
No changes to medicine  Continue to take warfarin as prescribed by your primary care doctor  I will send a message to hematology to see if he wants to see you in clinic again  I hope your cardiology appointment next week goes well  Return to clinic in 1 year or sooner as needed with Dr. Judeth Horn

## 2023-09-22 ENCOUNTER — Ambulatory Visit: Payer: 59 | Admitting: Cardiology

## 2023-09-25 ENCOUNTER — Ambulatory Visit: Payer: 59 | Attending: Cardiology | Admitting: Cardiology

## 2023-09-25 ENCOUNTER — Encounter: Payer: Self-pay | Admitting: Cardiology

## 2023-09-25 VITALS — BP 138/78 | HR 79 | Resp 16 | Ht 67.0 in | Wt 259.0 lb

## 2023-09-25 DIAGNOSIS — I2510 Atherosclerotic heart disease of native coronary artery without angina pectoris: Secondary | ICD-10-CM | POA: Diagnosis not present

## 2023-09-25 NOTE — Patient Instructions (Signed)
Medication Instructions:   Your physician recommends that you continue on your current medications as directed. Please refer to the Current Medication list given to you today.  *If you need a refill on your cardiac medications before your next appointment, please call your pharmacy*    Follow-Up: At [facility], you and your health needs are our priority.  As part of our continuing mission to provide you with exceptional heart care, we have created designated Provider Care Teams.  These Care Teams include your primary Cardiologist (physician) and Advanced Practice Providers (APPs -  Physician Assistants and Nurse Practitioners) who all work together to provide you with the care you need, when you need it.  We recommend signing up for the patient portal called "MyChart".  Sign up information is provided on this After Visit Summary.  MyChart is used to connect with patients for Virtual Visits (Telemedicine).  Patients are able to view lab/test results, encounter notes, upcoming appointments, etc.  Non-urgent messages can be sent to your provider as well.   To learn more about what you can do with MyChart, go to [email or web address].    Your next appointment:   1 year(s)  Provider:   Dr. Rosemary Holms

## 2023-09-25 NOTE — Progress Notes (Signed)
Cardiology Office Note:  .   Date:  09/25/2023  ID:  Travis Rollins, DOB [birth year], MRN [id number] PCP: Hillery Aldo, NP  [facility] Providers Cardiologist:  Truett Mainland, MD PCP: Hillery Aldo, NP  Chief Complaint  Patient presents with   Coronary artery disease involving native coronary artery of   Follow-up    1 year      History of Present Illness: .    Travis Rollins is a 57 y.o. male with hypertension, type 2 DM, h/o recurrent DVT/PE, now s/p IVC filter placement and on long-term anticoagulation, mild nonobstructive coronary artery disease.   Patient is originally from [place], where he had his first PE in 2005.  He had his last PE in 2015, at which time, IVC filter was placed.  It does not appear that he is seen a hematologist.  He was previously on warfarin, then on Xarelto, and now on Eliquis.  He has not had any PE since 2016.  He underwent coronary angiogram in 2016 that showed mild nonobstructive coronary artery disease.   Unfortunately, patient was hospitalized 05/2021 with severe back pain and shortness of breath and was found to have recurrent bilateral pulmonary embolism with borderline RV strain.  Hematology/oncology recommended transition from Lovenox to Coumadin with bridging with Lovenox, since recurrent PEs have happened while on various DOAC's.  Goal INR was recommended 3-3.5. He was also diagnosed with lupus anticoagulant, potentially cause for his recurrent PE.    Given his persistent leukocytopenia and thrombocytopenia, he is seeing Dr. Arbutus Ped. If he has worsening leukocytopenia thrombocytopenia in future, he may consider bone marrow biopsy and aspirate.  Fortunately, he has done well and has no complaints today. He stays active with walking, denies chest pain, shortness of breath, palpitations, leg edema, orthopnea, PND, TIA/syncope. He has upcoming labs with PCP next month, including lipid panel..     Vitals:   09/25/23 0831  BP:  138/78  Pulse: 79  Resp: 16     ROS:  Review of Systems  Cardiovascular:  Negative for chest pain, dyspnea on exertion, leg swelling, palpitations and syncope.     Studies Reviewed: Marland Kitchen       EKG 09/25/2023: Normal sinus rhythm Low voltage QRS When compared with ECG of 19-May-2021 21:04, Nonspecific T wave abnormality no longer evident in Inferior leads      Physical Exam:   Physical Exam Vitals and nursing note reviewed.  Constitutional:      General: He is not in acute distress. Neck:     Vascular: No JVD.  Cardiovascular:     Rate and Rhythm: Normal rate and regular rhythm.     Heart sounds: Normal heart sounds. No murmur heard. Pulmonary:     Effort: Pulmonary effort is normal.     Breath sounds: Normal breath sounds. No wheezing or rales.  Abdominal:     Hernia: A hernia is present.  Musculoskeletal:     Right lower leg: No edema.     Left lower leg: No edema.      VISIT DIAGNOSES: No diagnosis found.   ASSESSMENT AND PLAN: .    Travis Rollins is a 57 y.o. male with hypertension, type 2 DM, h/o recurrent DVT/PE, now s/p IVC filter placement and on long-term anticoagulation, mild nonobstructive coronary artery disease.   Coronary artery disease: Minimal CAD seen on coronary angiogram in 2016.   No angina symptoms at this time.  Continue aggressive risk factor modification.  Not on aspirin  given ongoing use of warfarin for long-term anticoagulation.  Given his complex history of venous thromboembolism and need for lifelong anticoagulation, I would be more conservative with management of CAD and recommend invasive management only if absolutely necessary.  No indication for any invasive management at this time.    Recurrent PE: Was reported with anticoagulant.  Currently on warfarin with goal INR 3.0-3.5. Continue follow-up with hematology/oncology.   Type 2 DM: Continue management as per PCP.    Venous insufficiency: No overt symptoms, other than  lower extremity discoloration.  Recommend compression stockings.    F/u in 1 year  Signed, Elder Negus, MD

## 2023-09-29 ENCOUNTER — Telehealth: Payer: Self-pay

## 2023-09-29 NOTE — Telephone Encounter (Signed)
Patient has been scheduled for OV on 1/22 at 11:30 wtith Doug Sou.

## 2023-09-29 NOTE — Telephone Encounter (Signed)
Dr. Marina Goodell,   Patient is scheduled for a recall colon with you. He is on chronic anticoagulation (coumadin) for DVT. His last OV with you was 3 years ago. Are you ok with [place] reaching out for clearance or would you like him to have an OV with APP.   Thank you,  Pre visit

## 2023-09-29 NOTE — Telephone Encounter (Signed)
Office visit with advanced practitioner.  Thanks

## 2023-10-13 ENCOUNTER — Other Ambulatory Visit (INDEPENDENT_AMBULATORY_CARE_PROVIDER_SITE_OTHER): Payer: 59

## 2023-10-13 ENCOUNTER — Ambulatory Visit (INDEPENDENT_AMBULATORY_CARE_PROVIDER_SITE_OTHER): Payer: 59 | Admitting: Orthopaedic Surgery

## 2023-10-13 VITALS — BP 123/76 | HR 92 | Ht 67.0 in | Wt 259.0 lb

## 2023-10-13 DIAGNOSIS — G8929 Other chronic pain: Secondary | ICD-10-CM

## 2023-10-13 DIAGNOSIS — M25561 Pain in right knee: Secondary | ICD-10-CM

## 2023-10-13 NOTE — Progress Notes (Signed)
Office Visit Note   Patient: Travis Rollins           Date of Birth: December 22, 1965           MRN: [id number] Visit Date: 10/13/2023              Requested by: Hillery Aldo, NP [address] [place],  [place] [address] PCP: Hillery Aldo, NP   Assessment & Plan: Visit Diagnoses:  1. Chronic pain of right knee     Plan: Patient had some short-term synovitis he might possibly had some hemarthrosis since he is on chronic Coumadin.  His symptoms have resolved with ice elevation and use of a cane and ibuprofen.  He is able to ambulate without the cane has no swelling in his knee and has had near complete resolution of his symptoms now 8 days later.  He can wean off the ibuprofen in a week and resume normal activities.  Follow-up as needed.  Follow-Up Instructions: Return if symptoms worsen or fail to improve.   Orders:  Orders Placed This Encounter  Procedures   XR Knee 1-2 Views Right   No orders of the defined types were placed in this encounter.     Procedures: No procedures performed   Clinical Data: No additional findings.   Subjective: Chief Complaint  Patient presents with   Right Knee - Pain    HPI 57 year old male insulin-dependent diabetic with A1c 6.7 did a lot of walking and then started having increased pain in his right knee swelling.  He states he had to use ice constantly and started taking some ibuprofen 800 mg.  He states his last kidney function was normal in the past she has had times when his A1c was greater than 10 and his creatinine was mildly elevated.  Since he is taken ibuprofen ice and rested his knee swelling is down he is having minimal pain.  He used a cane for period of time still has 1 with him today.  I am not seen him since 2019 for some shoulder impingement which is doing much better.  Has a history of pulmonary embolism x 3 and is on chronic Coumadin.  Denies any specific injury to his knee just did a lot more walking and then developed  swelling in his knee.  Review of Systems all systems are noncontributory to HPI.   Objective: Vital Signs: BP 123/76   Pulse 92   Ht 5\' 7"  (1.702 m)   Wt 259 lb (117.5 kg)   BMI 40.57 kg/m   Physical Exam Constitutional:      Appearance: He is well-developed.  HENT:     Head: Normocephalic and atraumatic.     Right Ear: External ear normal.     Left Ear: External ear normal.  Eyes:     Pupils: Pupils are equal, round, and reactive to light.  Neck:     Thyroid: No thyromegaly.     Trachea: No tracheal deviation.  Cardiovascular:     Rate and Rhythm: Normal rate.  Pulmonary:     Effort: Pulmonary effort is normal.     Breath sounds: No wheezing.  Abdominal:     General: Bowel sounds are normal.     Palpations: Abdomen is soft.  Musculoskeletal:     Cervical back: Neck supple.  Skin:    General: Skin is warm and dry.     Capillary Refill: Capillary refill takes less than 2 seconds.  Neurological:     Mental Status:  He is alert and oriented to person, place, and time.  Psychiatric:        Behavior: Behavior normal.        Thought Content: Thought content normal.        Judgment: Judgment normal.     Ortho Exam collateral crucial ligament exam is normal.  Some prominence of the tibial tubercle which is symmetrical.  Crepitus with knee extension he has full extension good flexion.  MCL LCL exam is normal.  Specialty Comments:  No specialty comments available.  Imaging: XR Knee 1-2 Views Right  Result Date: 10/13/2023 Standing AP both knees lateral right knee  x-ray demonstrates no significant medial lateral joint space narrowing.  Some calcification prominence of the distal tuberosity suggesting mild Osgood-Schlatter.  Mild irregularity of the patellofemoral joint noted. Impression: Chondromalacia patella right knee.  No acute bone changes.    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 vein of left lower extremity (HCC) 04/06/2018   Varicose veins of left lower extremity with complications 04/06/2018   Atypical chest pain 08/09/2015   Thrombocytopenia (HCC) 02/26/2014   Hyperkalemia 02/25/2014   Acute respiratory failure with hypoxia (HCC) 02/25/2014   Morbid obesity (HCC) 03/20/2012   DM (diabetes mellitus), type 2 with renal complications (HCC) 03/20/2012   Venous stasis 03/20/2012   Past Medical History:  Diagnosis Date   Anemia    Carpal tunnel syndrome    Deep vein thrombosis (HCC) 07/2013, 10/2014, 03/16/2015   LLE (studies at [facility])   Depression    Diabetes mellitus    Hyperlipidemia    Hypertension    Obesity    Osteoarthritis    Peripheral vascular disease (HCC)    6 yrs ago, DVT in Lt knee/ groin   Pulmonary embolism (HCC)    hx of x 3 per patient; most recent 02/2014   Sinusitis    Sleep apnea     Family History  Problem Relation Age of Onset   Kidney disease Father    Diabetes Father    Colon cancer Neg Hx    Esophageal cancer Neg Hx    Rectal cancer Neg Hx    Stomach cancer Neg Hx     Past Surgical History:  Procedure Laterality Date   CARDIAC CATHETERIZATION N/A 08/10/2015   Procedure: Left Heart Cath and Coronary Angiography;  Surgeon: Meriah Shands Decamp, MD;  Location: [facility] INVASIVE CV LAB;  Service: Cardiovascular;  Laterality: N/A;   CYSTOSCOPY WITH RETROGRADE PYELOGRAM, URETEROSCOPY AND STENT PLACEMENT Bilateral 01/03/2015   Procedure: CYSTOSCOPY WITH RETROGRADE PYELOGRAM, RIGHT URETEROSCOPY AND RIGHT STENT PLACEMENT;  Surgeon: Sebastian Ache, MD;  Location: [facility];  Service: Urology;  Laterality: Bilateral;    CYSTOSCOPY WITH RETROGRADE PYELOGRAM, URETEROSCOPY AND STENT PLACEMENT Right 03/12/2016   Procedure: CYSTOSCOPY WITH RETROGRADE PYELOGRAM, URETEROSCOPY AND STENT PLACEMENT;  Surgeon: Sebastian Ache, MD;  Location: [facility];  Service: Urology;  Laterality: Right;   ENDOVENOUS ABLATION SAPHENOUS VEIN W/ LASER Left 04/20/2018   endovenous laser ablation left greater saphenous vein by Josephina Gip MD    HOLMIUM LASER APPLICATION Right 01/03/2015   Procedure: HOLMIUM LASER APPLICATION;  Surgeon: Sebastian Ache, MD;  Location: [facility];  Service: Urology;  Laterality: Right;   HOLMIUM LASER APPLICATION Right 03/12/2016   Procedure: HOLMIUM LASER APPLICATION;  Surgeon: Sebastian Ache, MD;  Location: [facility];  Service: Urology;  Laterality: Right;   IVC filter   07/2014    TONSILLECTOMY     tubes removd from ears     Social History   Occupational History   Not on file  Tobacco Use   Smoking status: Never   Smokeless tobacco: Never  Vaping Use   Vaping status: Never Used  Substance and Sexual Activity   Alcohol use: Never   Drug use: No   Sexual activity: Yes    Birth control/protection: Condom

## 2023-10-20 ENCOUNTER — Encounter: Payer: Self-pay | Admitting: Internal Medicine

## 2023-10-23 ENCOUNTER — Encounter: Payer: 59 | Admitting: Internal Medicine

## 2023-12-08 ENCOUNTER — Ambulatory Visit (INDEPENDENT_AMBULATORY_CARE_PROVIDER_SITE_OTHER): Payer: 59 | Admitting: Orthopaedic Surgery

## 2023-12-08 ENCOUNTER — Encounter: Payer: Self-pay | Admitting: Orthopaedic Surgery

## 2023-12-08 VITALS — BP 117/82 | HR 91

## 2023-12-08 DIAGNOSIS — G8929 Other chronic pain: Secondary | ICD-10-CM

## 2023-12-08 DIAGNOSIS — M25561 Pain in right knee: Secondary | ICD-10-CM | POA: Diagnosis not present

## 2023-12-08 DIAGNOSIS — M2241 Chondromalacia patellae, right knee: Secondary | ICD-10-CM | POA: Diagnosis not present

## 2023-12-08 DIAGNOSIS — M2242 Chondromalacia patellae, left knee: Secondary | ICD-10-CM | POA: Diagnosis not present

## 2023-12-08 NOTE — Progress Notes (Signed)
 Office Visit Note   Patient: Travis Rollins           Date of Birth: [birth year]           MRN: [id number] Visit Date: 12/08/2023              Requested by: [name], NP [address] [place],  [place] [address] PCP: [name], NP   Assessment & Plan: Visit Diagnoses:  1. Chronic pain of right knee   2. Chondromalacia patellae, left knee   3. Chondromalacia patellae, right knee     Plan: Will refer him some physical therapy for terminal arc quads isometric quadriceps contractures and straight leg raising.  We discussed strengthening activities to help with quad that do not load the patellofemoral joint.  He can follow-up as having ongoing problems after therapy.  Follow-Up Instructions: Return in about 7 weeks (around 01/26/2024).   Orders:  Orders Placed This Encounter  Procedures   Ambulatory referral to Physical Therapy   No orders of the defined types were placed in this encounter.     Procedures: No procedures performed   Clinical Data: No additional findings.   Subjective: Chief Complaint  Patient presents with   Right Knee - Pain    HPI 57 year old male with diabetes renal complications coronary artery disease hypertension history of pulmonary embolisms still on Coumadin  is seen with problems with knee pain going up stairs.  Knee feels weak he was having more pain in the left now having pain in his right.  He is ambulatory with a cane.  He has noted crepitus with knee extension great difficulty if he tries to squat.  X-rays 10/13/2023 showed medial joint space narrowing bilaterally.  Some calcification of the distal tuberosity suggesting mild Osgood-Schlatter's in the past.  Irregularity of the patellofemoral joint.  Review of Systems all other systems noncontributory to HPI.   Objective: Vital Signs: BP 117/82   Pulse 91   Physical Exam Constitutional:      Appearance: He is well-developed.  HENT:     Head: Normocephalic and atraumatic.      Right Ear: External ear normal.     Left Ear: External ear normal.  Eyes:     Pupils: Pupils are equal, round, and reactive to light.  Neck:     Thyroid : No thyromegaly.     Trachea: No tracheal deviation.  Cardiovascular:     Rate and Rhythm: Normal rate.  Pulmonary:     Effort: Pulmonary effort is normal.     Breath sounds: No wheezing.  Abdominal:     General: Bowel sounds are normal.     Palpations: Abdomen is soft.  Musculoskeletal:     Cervical back: Neck supple.  Skin:    General: Skin is warm and dry.     Capillary Refill: Capillary refill takes less than 2 seconds.  Neurological:     Mental Status: He is alert and oriented to person, place, and time.  Psychiatric:        Behavior: Behavior normal.        Thought Content: Thought content normal.        Judgment: Judgment normal.     Ortho Exam bilateral crepitus with knee extension pain with patellofemoral loading and bilateral quad weakness with resisted testing sitting position.  Negative logroll hips.  Specialty Comments:  No specialty comments available.  Imaging: No results found.   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) 04/06/2018   Varicose veins of left lower extremity with complications 04/06/2018   Atypical chest pain 08/09/2015   Thrombocytopenia (HCC) 02/26/2014   Hyperkalemia 02/25/2014   Acute respiratory failure with hypoxia (HCC) 02/25/2014   Morbid obesity (HCC) 03/20/2012   DM  (diabetes mellitus), type 2 with renal complications (HCC) 03/20/2012   Venous stasis 03/20/2012   Past Medical History:  Diagnosis Date   Anemia    Carpal tunnel syndrome    Deep vein thrombosis (HCC) 07/2013, 10/2014, 03/16/2015   LLE (studies at [facility])   Depression    Diabetes mellitus    Hyperlipidemia    Hypertension    Obesity    Osteoarthritis    Peripheral vascular disease (HCC)    6 yrs ago, DVT in Lt knee/ groin   Pulmonary embolism (HCC)    hx of x 3 per patient; most recent 02/2014   Sinusitis    Sleep apnea     Family History  Problem Relation Age of Onset   Kidney disease Father    Diabetes Father    Colon cancer Neg Hx    Esophageal cancer Neg Hx    Rectal cancer Neg Hx    Stomach cancer Neg Hx     Past Surgical History:  Procedure Laterality Date   CARDIAC CATHETERIZATION N/A 08/10/2015   Procedure: Left Heart Cath and Coronary Angiography;  Surgeon: Gordy Bergamo, MD;  Location: [facility] INVASIVE CV LAB;  Service: Cardiovascular;  Laterality: N/A;   CYSTOSCOPY WITH RETROGRADE PYELOGRAM, URETEROSCOPY AND STENT PLACEMENT Bilateral 01/03/2015   Procedure: CYSTOSCOPY WITH RETROGRADE PYELOGRAM, RIGHT URETEROSCOPY AND RIGHT STENT PLACEMENT;  Surgeon: Ricardo Likens, MD;  Location: [facility];  Service: Urology;  Laterality: Bilateral;   CYSTOSCOPY WITH RETROGRADE PYELOGRAM, URETEROSCOPY AND STENT PLACEMENT Right 03/12/2016   Procedure: CYSTOSCOPY WITH RETROGRADE PYELOGRAM, URETEROSCOPY AND STENT PLACEMENT;  Surgeon: Ricardo Likens, MD;  Location: [facility];  Service: Urology;  Laterality: Right;   ENDOVENOUS ABLATION SAPHENOUS VEIN W/ LASER Left 04/20/2018   endovenous laser ablation left greater saphenous vein by Lynwood Collum MD    HOLMIUM LASER APPLICATION Right 01/03/2015   Procedure: HOLMIUM LASER APPLICATION;  Surgeon: Ricardo Likens, MD;  Location: [facility];  Service: Urology;  Laterality: Right;   HOLMIUM LASER APPLICATION Right 03/12/2016   Procedure: HOLMIUM LASER APPLICATION;   Surgeon: Ricardo Likens, MD;  Location: [facility];  Service: Urology;  Laterality: Right;   IVC filter   07/2014    TONSILLECTOMY     tubes removd from ears     Social History   Occupational History   Not on file  Tobacco Use   Smoking status: Never   Smokeless tobacco: Never  Vaping Use   Vaping status: Never Used  Substance and Sexual Activity   Alcohol use: Never   Drug use: No   Sexual activity: Yes    Birth control/protection: Condom

## 2023-12-14 DIAGNOSIS — M2241 Chondromalacia patellae, right knee: Secondary | ICD-10-CM | POA: Insufficient documentation

## 2023-12-14 DIAGNOSIS — M2242 Chondromalacia patellae, left knee: Secondary | ICD-10-CM | POA: Insufficient documentation

## 2023-12-30 ENCOUNTER — Ambulatory Visit: Payer: 59 | Admitting: Gastroenterology

## 2024-01-26 ENCOUNTER — Ambulatory Visit (INDEPENDENT_AMBULATORY_CARE_PROVIDER_SITE_OTHER): Payer: 59 | Admitting: Orthopaedic Surgery

## 2024-01-26 DIAGNOSIS — M2241 Chondromalacia patellae, right knee: Secondary | ICD-10-CM | POA: Diagnosis not present

## 2024-01-26 NOTE — Progress Notes (Signed)
Office Visit Note   Patient: Travis Rollins           Date of Birth: [birth year]           MRN: [id number] Visit Date: 01/26/2024              Requested by: Hillery Aldo, NP [address] [place],  [place] [address] PCP: Hillery Aldo, NP   Assessment & Plan: Visit Diagnoses:  1. Chondromalacia patellae, right knee     Plan: Therapy order was placed he has not not been called that he is aware of.  We gave him the phone number he will call them to set up his appointment he will follow-up with me in 5 weeks.  Follow-Up Instructions: Return in about 5 weeks (around 03/01/2024).   Orders:  No orders of the defined types were placed in this encounter.  No orders of the defined types were placed in this encounter.     Procedures: No procedures performed   Clinical Data: No additional findings.   Subjective: Chief Complaint  Patient presents with   Right Knee - Pain, Follow-up   Left Knee - Follow-up, Pain    HPI 58 year old male returns he is ambulating with a cane he has not had any physical therapy which was ordered last visit.  He thinks his knee might be a little bit better still bothers him when he goes up and down steps or if he tries to bend or get down on the floor.  He does have type 2 diabetes on insulin.  No locking of his knee no fever or chills.  Review of Systems 14 point system updated unchanged from 7 weeks ago office visit.  Of note his coronary disease and diabetes on insulin history of DVT and bilateral PEs.   Objective: Vital Signs: There were no vitals taken for this visit.  Physical Exam Constitutional:      Appearance: He is well-developed.  HENT:     Head: Normocephalic and atraumatic.     Right Ear: External ear normal.     Left Ear: External ear normal.  Eyes:     Pupils: Pupils are equal, round, and reactive to light.  Neck:     Thyroid: No thyromegaly.     Trachea: No tracheal deviation.  Cardiovascular:     Rate and Rhythm:  Normal rate.  Pulmonary:     Effort: Pulmonary effort is normal.     Breath sounds: No wheezing.  Abdominal:     General: Bowel sounds are normal.     Palpations: Abdomen is soft.  Musculoskeletal:     Cervical back: Neck supple.  Skin:    General: Skin is warm and dry.     Capillary Refill: Capillary refill takes less than 2 seconds.  Neurological:     Mental Status: He is alert and oriented to person, place, and time.  Psychiatric:        Behavior: Behavior normal.        Thought Content: Thought content normal.        Judgment: Judgment normal.     Ortho Exam patient with crepitus he has full extension and flexion he is amatory with limping.  Some right knee pain with squatting.  Collateral ligaments are stable no knee effusion ACL PCL is normal.  Pain with patellar loading quadriceps contracture.  Specialty Comments:  No specialty comments available.  Imaging: No results found.   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) 04/06/2018   Varicose veins of left lower extremity with complications 04/06/2018   Atypical chest pain 08/09/2015   Thrombocytopenia (HCC) 02/26/2014   Hyperkalemia 02/25/2014   Acute respiratory failure with hypoxia (HCC) 02/25/2014   Morbid obesity (HCC) 03/20/2012   DM (diabetes mellitus), type 2 with renal complications (HCC) 03/20/2012   Venous stasis 03/20/2012   Past Medical History:  Diagnosis Date   Anemia    Carpal tunnel syndrome     Deep vein thrombosis (HCC) 07/2013, 10/2014, 03/16/2015   LLE (studies at [facility])   Depression    Diabetes mellitus    Hyperlipidemia    Hypertension    Obesity    Osteoarthritis    Peripheral vascular disease (HCC)    6 yrs ago, DVT in Lt knee/ groin   Pulmonary embolism (HCC)    hx of x 3 per patient; most recent 02/2014   Sinusitis    Sleep apnea     Family History  Problem Relation Age of Onset   Kidney disease Father    Diabetes Father    Colon cancer Neg Hx    Esophageal cancer Neg Hx    Rectal cancer Neg Hx    Stomach cancer Neg Hx     Past Surgical History:  Procedure Laterality Date   CARDIAC CATHETERIZATION N/A 08/10/2015   Procedure: Left Heart Cath and Coronary Angiography;  Surgeon: Jaimee Corum Decamp, MD;  Location: [facility] INVASIVE CV LAB;  Service: Cardiovascular;  Laterality: N/A;   CYSTOSCOPY WITH RETROGRADE PYELOGRAM, URETEROSCOPY AND STENT PLACEMENT Bilateral 01/03/2015   Procedure: CYSTOSCOPY WITH RETROGRADE PYELOGRAM, RIGHT URETEROSCOPY AND RIGHT STENT PLACEMENT;  Surgeon: Sebastian Ache, MD;  Location: [facility];  Service: Urology;  Laterality: Bilateral;   CYSTOSCOPY WITH RETROGRADE PYELOGRAM, URETEROSCOPY AND STENT PLACEMENT Right 03/12/2016   Procedure: CYSTOSCOPY WITH RETROGRADE PYELOGRAM, URETEROSCOPY AND STENT PLACEMENT;  Surgeon: Sebastian Ache, MD;  Location: [facility];  Service: Urology;  Laterality: Right;   ENDOVENOUS ABLATION SAPHENOUS VEIN W/ LASER Left 04/20/2018   endovenous laser ablation left greater saphenous vein by Josephina Gip MD    HOLMIUM LASER APPLICATION Right 01/03/2015   Procedure: HOLMIUM LASER APPLICATION;  Surgeon: Sebastian Ache, MD;  Location: [facility];  Service: Urology;  Laterality: Right;   HOLMIUM LASER APPLICATION Right 03/12/2016   Procedure: HOLMIUM LASER APPLICATION;  Surgeon: Sebastian Ache, MD;  Location: [facility];  Service: Urology;  Laterality: Right;   IVC filter   07/2014    TONSILLECTOMY     tubes removd from ears     Social History    Occupational History   Not on file  Tobacco Use   Smoking status: Never   Smokeless tobacco: Never  Vaping Use   Vaping status: Never Used  Substance and Sexual Activity   Alcohol use: Never   Drug use: No   Sexual activity: Yes    Birth control/protection: Condom

## 2024-02-15 NOTE — Therapy (Unsigned)
 OUTPATIENT PHYSICAL THERAPY LOWER EXTREMITY EVALUATION   Patient Name: Travis Rollins MRN: [id number] DOB:04/03/1966, 58 y.o., male Today's Date: 02/17/2024  END OF SESSION:  PT End of Session - 02/17/24 1223     Visit Number 1    Number of Visits 12    Date for PT Re-Evaluation 04/18/24    Authorization Type UHC MCR    PT Start Time 1215    PT Stop Time 1300    PT Time Calculation (min) 45 min    Activity Tolerance Patient tolerated treatment well    Behavior During Therapy [facility] for tasks assessed/performed             Past Medical History:  Diagnosis Date   Anemia    Carpal tunnel syndrome    Deep vein thrombosis (HCC) 07/2013, 10/2014, 03/16/2015   LLE (studies at [facility])   Depression    Diabetes mellitus    Hyperlipidemia    Hypertension    Obesity    Osteoarthritis    Peripheral vascular disease (HCC)    6 yrs ago, DVT in Lt knee/ groin   Pulmonary embolism (HCC)    hx of x 3 per patient; most recent 02/2014   Sinusitis    Sleep apnea    Past Surgical History:  Procedure Laterality Date   CARDIAC CATHETERIZATION N/A 08/10/2015   Procedure: Left Heart Cath and Coronary Angiography;  Surgeon: Yates Decamp, MD;  Location: [facility] INVASIVE CV LAB;  Service: Cardiovascular;  Laterality: N/A;   CYSTOSCOPY WITH RETROGRADE PYELOGRAM, URETEROSCOPY AND STENT PLACEMENT Bilateral 01/03/2015   Procedure: CYSTOSCOPY WITH RETROGRADE PYELOGRAM, RIGHT URETEROSCOPY AND RIGHT STENT PLACEMENT;  Surgeon: Sebastian Ache, MD;  Location: [facility];  Service: Urology;  Laterality: Bilateral;   CYSTOSCOPY WITH RETROGRADE PYELOGRAM, URETEROSCOPY AND STENT PLACEMENT Right 03/12/2016   Procedure: CYSTOSCOPY WITH RETROGRADE PYELOGRAM, URETEROSCOPY AND STENT PLACEMENT;  Surgeon: Sebastian Ache, MD;  Location: [facility];  Service: Urology;  Laterality: Right;   ENDOVENOUS ABLATION SAPHENOUS VEIN W/ LASER Left 04/20/2018   endovenous laser ablation left greater saphenous vein by Josephina Gip MD    HOLMIUM  LASER APPLICATION Right 01/03/2015   Procedure: HOLMIUM LASER APPLICATION;  Surgeon: Sebastian Ache, MD;  Location: [facility];  Service: Urology;  Laterality: Right;   HOLMIUM LASER APPLICATION Right 03/12/2016   Procedure: HOLMIUM LASER APPLICATION;  Surgeon: Sebastian Ache, MD;  Location: [facility];  Service: Urology;  Laterality: Right;   IVC filter   07/2014    TONSILLECTOMY     tubes removd from ears     Patient Active Problem List   Diagnosis Date Noted   Chondromalacia patellae, right knee 12/14/2023   Leukocytopenia 08/05/2021   Type 2 diabetes mellitus without complication, with long-term current use of insulin (HCC)    Bilateral pulmonary embolism (HCC) 05/20/2021   Coronary artery disease involving native coronary artery of native heart without angina pectoris 09/03/2020   Mixed hyperlipidemia 09/03/2020   Essential hypertension 09/03/2020   Chronic deep vein thrombosis (DVT) of popliteal vein of left lower extremity (HCC) 04/06/2018   Varicose veins of left lower extremity with complications 04/06/2018   Atypical chest pain 08/09/2015   Thrombocytopenia (HCC) 02/26/2014   Hyperkalemia 02/25/2014   Acute respiratory failure with hypoxia (HCC) 02/25/2014   Morbid obesity (HCC) 03/20/2012   DM (diabetes mellitus), type 2 with renal complications (HCC) 03/20/2012   Venous stasis 03/20/2012    PCP: Hillery Aldo, NP   REFERRING PROVIDER: Eldred Manges, MD  REFERRING DIAG:  M25.561,G89.29 (ICD-10-CM) - Chronic pain of right knee  THERAPY DIAG:  Chronic pain of right knee  Muscle weakness (generalized)  Rationale for Evaluation and Treatment: Rehabilitation  ONSET DATE: 10/13/2023  SUBJECTIVE:   SUBJECTIVE STATEMENT: Describes primarily R knee pain ongoing 3-4 years.    PERTINENT HISTORY: HPI 58 year old male with diabetes renal complications coronary artery disease hypertension history of pulmonary embolisms still on Coumadin is seen with problems with knee pain going up  stairs.  Knee feels weak he was having more pain in the left now having pain in his right.  He is ambulatory with a cane.  He has noted crepitus with knee extension great difficulty if he tries to squat. PAIN:  Are you having pain? Yes: NPRS scale: "15"/10 Pain location: B knees Pain description: ache, sore, sharp Aggravating factors: stairs, hills Relieving factors: rest  PRECAUTIONS: None  RED FLAGS: None   WEIGHT BEARING RESTRICTIONS: No  FALLS:  Has patient fallen in last 6 months? No  OCCUPATION:  n ot working  PLOF: Independent  PATIENT GOALS: To manage my knee pain  NEXT MD VISIT: 03/01/24  OBJECTIVE:  Note: Objective measures were completed at Evaluation unless otherwise noted.  DIAGNOSTIC FINDINGS: X-rays 10/13/2023 showed medial joint space narrowing bilaterally. Some calcification of the distal tuberosity suggesting mild Osgood-Schlatter's in the past. Irregularity of the patellofemoral joint.  PATIENT SURVEYS:  LEFS 52/80  MUSCLE LENGTH: Hamstrings: Right 80 deg; Left 80 deg  POSTURE:  ER at hips  PALPATION: unremarkable  LOWER EXTREMITY ROM: WNL in knees  Active ROM Right eval Left eval  Hip flexion    Hip extension    Hip abduction    Hip adduction    Hip internal rotation    Hip external rotation    Knee flexion    Knee extension    Ankle dorsiflexion    Ankle plantarflexion    Ankle inversion    Ankle eversion     (Blank rows = not tested)  LOWER EXTREMITY MMT:  MMT Right eval Left eval  Hip flexion    Hip extension    Hip abduction    Hip adduction    Hip internal rotation    Hip external rotation    Knee flexion 4   Knee extension 4   Ankle dorsiflexion    Ankle plantarflexion    Ankle inversion    Ankle eversion     (Blank rows = not tested)  LOWER EXTREMITY SPECIAL TESTS:  Knee special tests: Patellafemoral apprehension test: negative, Lateral pull sign: positive , and Patellafemoral grind test: negative  FUNCTIONAL  TESTS:  30 seconds chair stand test 4  GAIT: Distance walked: 47ft x2 Assistive device utilized: Single point cane Level of assistance: Complete Independence Comments: slow cadence, ER at hips  TREATMENT DATE:  [facility] Adult PT Treatment:                                                DATE: 02/17/24  Self Care: Additional minutes spent for educating on updated Therapeutic Home Exercise Program as well as comparing current status to condition at start of symptoms. This included exercises focusing on stretching, strengthening, with focus on eccentric aspects. Long term goals include an improvement in range of motion, strength, endurance as well as avoiding reinjury. Patient's frequency would include in 1-2 times a day, 3-5 times a week for a duration of 6-12 weeks. Proper technique shown and discussed handout in great detail. All questions were discussed and addressed.       PATIENT EDUCATION:  Education details: Discussed eval findings, rehab rationale and POC and patient is in agreement  Person educated: Patient Education method: Explanation Education comprehension: verbalized understanding and needs further education  HOME EXERCISE PROGRAM: Access Code: [email or web address] URL: [email or web address] Date: 02/17/2024 Prepared by: Gustavus Bryant  Exercises - Supine Quad Set  - 1-2 x daily - 5 x weekly - 1-2 sets - 10 reps - 3s hold - Small Range Straight Leg Raise  - 1-2 x daily - 5 x weekly - 1-2 sets - 10 reps - 3s hold - Supine Knee Extension Strengthening  - 1-2 x daily - 5 x weekly - 1-2 sets - 10 reps - 3s hold - Clamshell  - 1-2 x daily - 5 x weekly - 1-2 sets - 10 reps - 3s hold  ASSESSMENT:  CLINICAL IMPRESSION: Patient is a 58 y.o. male who was seen today for physical therapy evaluation and treatment for chronic R knee pain due to underlying  degenerative changes in PF joint. Patient presents with full ROM in R knee, good quad contraction, no extensor lag with SLR, good patellar mobility.  30s chair stand test finds LE weakness especially in hip abduction.  OBJECTIVE IMPAIRMENTS: decreased activity tolerance, decreased endurance, decreased mobility, difficulty walking, decreased ROM, decreased strength, impaired perceived functional ability, impaired flexibility, obesity, and pain.   PERSONAL FACTORS: Age, Fitness, and Past/current experiences are also affecting patient's functional outcome.   REHAB POTENTIAL: Good  CLINICAL DECISION MAKING: Stable/uncomplicated  EVALUATION COMPLEXITY: Low   GOALS: Goals reviewed with patient? No   LONG TERM GOALS: Target date: 03/30/2024    Patient will score at least 64/80 on LEFS to signify clinically meaningful improvement in functional abilities.   Baseline: 52/80 Goal status: INITIAL  2.  Patient will acknowledge 6/10 pain at least once during episode of care   Baseline: "15"/10 Goal status: INITIAL  3.  Patient will increase 30s chair stand reps from 4 to 7 with/without arms to demonstrate and improved functional ability with less pain/difficulty as well as reduce fall risk.  Baseline: 4 Goal status: INITIAL  4.  Patient to demonstrate independence in HEP Baseline: [id number] Goal status: INITIAL  5.  Increase R knee strength to 4+/5 Baseline: 4/5 Goal status: INITIAL     PLAN:  PT FREQUENCY: 2x/week  PT DURATION: 6 weeks  PLANNED INTERVENTIONS: 97164- PT Re-evaluation, 97110-Therapeutic exercises, 97530- Therapeutic activity, 97112- Neuromuscular re-education, 97535- Self Care, 54098- Manual therapy, [phone]- Gait training, Patient/Family education, Balance training, Stair training, Taping, Dry Needling, and Joint mobilization  PLAN FOR NEXT SESSION: HEP review and update, manual techniques as appropriate,  aerobic tasks, ROM and flexibility activities, strengthening  and PREs, TPDN, gait and balance training as needed    Date of referral: 12/08/2023 Referring provider: Eldred Manges, MD Referring diagnosis? M25.561,G89.29 (ICD-10-CM) - Chronic pain of right knee Treatment diagnosis? (if different than referring diagnosis) M25.561,G89.29 (ICD-10-CM) - Chronic pain of right knee  What was this (referring dx) caused by? Arthritis  Nature of Condition: Chronic (continuous duration > 3 months)   Laterality: Rt  Current Functional Measure Score: LEFS 52/80  Objective measurements identify impairments when they are compared to normal values, the uninvolved extremity, and prior level of function.  [x]  Yes  []  No  Objective assessment of functional ability: Minimal functional limitations   Briefly describe symptoms: retropatellar pain with stairs and prolonged walking  How did symptoms start: degenerative  Average pain intensity:  Last 24 hours: 2/10  Past week: 15/10  How often does the pt experience symptoms? Occasionally  How much have the symptoms interfered with usual daily activities? A little bit  How has condition changed since care began at this facility? NA - initial visit  In general, how is the patients overall health? Fair   BACK PAIN (STarT Back Screening Tool) No   Hildred Laser, PT 02/17/2024, 1:25 PM

## 2024-02-17 ENCOUNTER — Ambulatory Visit: Attending: Orthopaedic Surgery

## 2024-02-17 ENCOUNTER — Other Ambulatory Visit: Payer: Self-pay

## 2024-02-17 DIAGNOSIS — M6281 Muscle weakness (generalized): Secondary | ICD-10-CM | POA: Insufficient documentation

## 2024-02-17 DIAGNOSIS — M25561 Pain in right knee: Secondary | ICD-10-CM | POA: Diagnosis present

## 2024-02-17 DIAGNOSIS — G8929 Other chronic pain: Secondary | ICD-10-CM | POA: Diagnosis present

## 2024-02-22 ENCOUNTER — Ambulatory Visit

## 2024-02-22 DIAGNOSIS — G8929 Other chronic pain: Secondary | ICD-10-CM

## 2024-02-22 DIAGNOSIS — M25561 Pain in right knee: Secondary | ICD-10-CM | POA: Diagnosis not present

## 2024-02-22 DIAGNOSIS — M6281 Muscle weakness (generalized): Secondary | ICD-10-CM

## 2024-02-22 NOTE — Therapy (Signed)
 OUTPATIENT PHYSICAL THERAPY TREATMENT NOTE   Patient Name: Travis Rollins MRN: [id number] DOB:06/03/1966, 58 y.o., male Today's Date: 02/22/2024  END OF SESSION:  PT End of Session - 02/22/24 0748     Visit Number 2    Number of Visits 12    Date for PT Re-Evaluation 04/18/24    Authorization Type UHC MCR    PT Start Time 0745    PT Stop Time 0825    PT Time Calculation (min) 40 min    Activity Tolerance Patient tolerated treatment well    Behavior During Therapy [facility] for tasks assessed/performed             Past Medical History:  Diagnosis Date   Anemia    Carpal tunnel syndrome    Deep vein thrombosis (HCC) 07/2013, 10/2014, 03/16/2015   LLE (studies at [facility])   Depression    Diabetes mellitus    Hyperlipidemia    Hypertension    Obesity    Osteoarthritis    Peripheral vascular disease (HCC)    6 yrs ago, DVT in Lt knee/ groin   Pulmonary embolism (HCC)    hx of x 3 per patient; most recent 02/2014   Sinusitis    Sleep apnea    Past Surgical History:  Procedure Laterality Date   CARDIAC CATHETERIZATION N/A 08/10/2015   Procedure: Left Heart Cath and Coronary Angiography;  Surgeon: Yates Decamp, MD;  Location: [facility] INVASIVE CV LAB;  Service: Cardiovascular;  Laterality: N/A;   CYSTOSCOPY WITH RETROGRADE PYELOGRAM, URETEROSCOPY AND STENT PLACEMENT Bilateral 01/03/2015   Procedure: CYSTOSCOPY WITH RETROGRADE PYELOGRAM, RIGHT URETEROSCOPY AND RIGHT STENT PLACEMENT;  Surgeon: Sebastian Ache, MD;  Location: [facility];  Service: Urology;  Laterality: Bilateral;   CYSTOSCOPY WITH RETROGRADE PYELOGRAM, URETEROSCOPY AND STENT PLACEMENT Right 03/12/2016   Procedure: CYSTOSCOPY WITH RETROGRADE PYELOGRAM, URETEROSCOPY AND STENT PLACEMENT;  Surgeon: Sebastian Ache, MD;  Location: [facility];  Service: Urology;  Laterality: Right;   ENDOVENOUS ABLATION SAPHENOUS VEIN W/ LASER Left 04/20/2018   endovenous laser ablation left greater saphenous vein by Josephina Gip MD    HOLMIUM LASER  APPLICATION Right 01/03/2015   Procedure: HOLMIUM LASER APPLICATION;  Surgeon: Sebastian Ache, MD;  Location: [facility];  Service: Urology;  Laterality: Right;   HOLMIUM LASER APPLICATION Right 03/12/2016   Procedure: HOLMIUM LASER APPLICATION;  Surgeon: Sebastian Ache, MD;  Location: [facility];  Service: Urology;  Laterality: Right;   IVC filter   07/2014    TONSILLECTOMY     tubes removd from ears     Patient Active Problem List   Diagnosis Date Noted   Chondromalacia patellae, right knee 12/14/2023   Leukocytopenia 08/05/2021   Type 2 diabetes mellitus without complication, with long-term current use of insulin (HCC)    Bilateral pulmonary embolism (HCC) 05/20/2021   Coronary artery disease involving native coronary artery of native heart without angina pectoris 09/03/2020   Mixed hyperlipidemia 09/03/2020   Essential hypertension 09/03/2020   Chronic deep vein thrombosis (DVT) of popliteal vein of left lower extremity (HCC) 04/06/2018   Varicose veins of left lower extremity with complications 04/06/2018   Atypical chest pain 08/09/2015   Thrombocytopenia (HCC) 02/26/2014   Hyperkalemia 02/25/2014   Acute respiratory failure with hypoxia (HCC) 02/25/2014   Morbid obesity (HCC) 03/20/2012   DM (diabetes mellitus), type 2 with renal complications (HCC) 03/20/2012   Venous stasis 03/20/2012    PCP: Hillery Aldo, NP   REFERRING PROVIDER: Eldred Manges, MD  REFERRING DIAG: [phone] (  ICD-10-CM) - Chronic pain of right knee  THERAPY DIAG:  Chronic pain of right knee  Muscle weakness (generalized)  Rationale for Evaluation and Treatment: Rehabilitation  ONSET DATE: 10/13/2023  SUBJECTIVE:   SUBJECTIVE STATEMENT: Arrives to PT with increased R knee pain as he was negotiating hills over the weekend.  Rates pain at 10/10.  Aggravated by flexion and sit/stand tasks.  PERTINENT HISTORY: HPI 58 year old male with diabetes renal complications coronary artery disease hypertension  history of pulmonary embolisms still on Coumadin is seen with problems with knee pain going up stairs.  Knee feels weak he was having more pain in the left now having pain in his right.  He is ambulatory with a cane.  He has noted crepitus with knee extension great difficulty if he tries to squat. PAIN:  Are you having pain? Yes: NPRS scale: "15"/10 Pain location: B knees Pain description: ache, sore, sharp Aggravating factors: stairs, hills Relieving factors: rest  PRECAUTIONS: None  RED FLAGS: None   WEIGHT BEARING RESTRICTIONS: No  FALLS:  Has patient fallen in last 6 months? No  OCCUPATION:  n ot working  PLOF: Independent  PATIENT GOALS: To manage my knee pain  NEXT MD VISIT: 03/01/24  OBJECTIVE:  Note: Objective measures were completed at Evaluation unless otherwise noted.  DIAGNOSTIC FINDINGS: X-rays 10/13/2023 showed medial joint space narrowing bilaterally. Some calcification of the distal tuberosity suggesting mild Osgood-Schlatter's in the past. Irregularity of the patellofemoral joint.  PATIENT SURVEYS:  LEFS 52/80  MUSCLE LENGTH: Hamstrings: Right 80 deg; Left 80 deg  POSTURE:  ER at hips  PALPATION: unremarkable  LOWER EXTREMITY ROM: WNL in knees  Active ROM Right eval Left eval  Hip flexion    Hip extension    Hip abduction    Hip adduction    Hip internal rotation    Hip external rotation    Knee flexion    Knee extension    Ankle dorsiflexion    Ankle plantarflexion    Ankle inversion    Ankle eversion     (Blank rows = not tested)  LOWER EXTREMITY MMT:  MMT Right eval Left eval  Hip flexion    Hip extension    Hip abduction    Hip adduction    Hip internal rotation    Hip external rotation    Knee flexion 4   Knee extension 4   Ankle dorsiflexion    Ankle plantarflexion    Ankle inversion    Ankle eversion     (Blank rows = not tested)  LOWER EXTREMITY SPECIAL TESTS:  Knee special tests: Patellafemoral apprehension test:  negative, Lateral pull sign: positive , and Patellafemoral grind test: negative  FUNCTIONAL TESTS:  30 seconds chair stand test 4  GAIT: Distance walked: 41ft x2 Assistive device utilized: Single point cane Level of assistance: Complete Independence Comments: slow cadence, ER at hips  TREATMENT DATE:  [facility] Adult PT Treatment:                                                DATE: 02/22/24 Therapeutic Exercise: Nustep L2 8 min Therapeutic Activity: FAQs 15x B Standing heel raise 15x Standing hamstring curls 15x B SAQs 15x B Bridge 15x SLR 15x B Seated hamstring stertch 30s x2 B   [facility] Adult PT Treatment:                                                DATE: 02/17/24 Eval and HEP Self Care: Additional minutes spent for educating on updated Therapeutic Home Exercise Program as well as comparing current status to condition at start of symptoms. This included exercises focusing on stretching, strengthening, with focus on eccentric aspects. Long term goals include an improvement in range of motion, strength, endurance as well as avoiding reinjury. Patient's frequency would include in 1-2 times a day, 3-5 times a week for a duration of 6-12 weeks. Proper technique shown and discussed handout in great detail. All questions were discussed and addressed.       PATIENT EDUCATION:  Education details: Discussed eval findings, rehab rationale and POC and patient is in agreement  Person educated: Patient Education method: Explanation Education comprehension: verbalized understanding and needs further education  HOME EXERCISE PROGRAM: Access Code: [email or web address] URL: [email or web address] Date: 02/17/2024 Prepared by: Gustavus Bryant  Exercises - Supine Quad Set  - 1-2 x daily - 5 x weekly - 1-2 sets - 10 reps - 3s hold - Small Range Straight Leg Raise  - 1-2 x  daily - 5 x weekly - 1-2 sets - 10 reps - 3s hold - Supine Knee Extension Strengthening  - 1-2 x daily - 5 x weekly - 1-2 sets - 10 reps - 3s hold - Clamshell  - 1-2 x daily - 5 x weekly - 1-2 sets - 10 reps - 3s hold  ASSESSMENT:  CLINICAL IMPRESSION: Patient arrives for first f/u session with increased R knee pain.  Session focused on ROM primarily and gentle B knee strengthening.  Patient able to tolerate all tasks w/o increased symptoms and pain lessened by end of session.  Patient is a 58 y.o. male who was seen today for physical therapy evaluation and treatment for chronic R knee pain due to underlying degenerative changes in PF joint. Patient presents with full ROM in R knee, good quad contraction, no extensor lag with SLR, good patellar mobility.  30s chair stand test finds LE weakness especially in hip abduction.  OBJECTIVE IMPAIRMENTS: decreased activity tolerance, decreased endurance, decreased mobility, difficulty walking, decreased ROM, decreased strength, impaired perceived functional ability, impaired flexibility, obesity, and pain.   PERSONAL FACTORS: Age, Fitness, and Past/current experiences are also affecting patient's functional outcome.   REHAB POTENTIAL: Good  CLINICAL DECISION MAKING: Stable/uncomplicated  EVALUATION COMPLEXITY: Low   GOALS: Goals reviewed with patient? No   LONG TERM GOALS: Target date: 03/30/2024    Patient will score at least 64/80 on LEFS to signify clinically meaningful improvement in functional abilities.   Baseline: 52/80 Goal status: INITIAL  2.  Patient will acknowledge 6/10 pain at least once during episode of care   Baseline: "  15"/10 Goal status: INITIAL  3.  Patient will increase 30s chair stand reps from 4 to 7 with/without arms to demonstrate and improved functional ability with less pain/difficulty as well as reduce fall risk.  Baseline: 4 Goal status: INITIAL  4.  Patient to demonstrate independence in HEP Baseline:  [id number] Goal status: INITIAL  5.  Increase R knee strength to 4+/5 Baseline: 4/5 Goal status: INITIAL     PLAN:  PT FREQUENCY: 2x/week  PT DURATION: 6 weeks  PLANNED INTERVENTIONS: 97164- PT Re-evaluation, 97110-Therapeutic exercises, 97530- Therapeutic activity, 97112- Neuromuscular re-education, 97535- Self Care, 13244- Manual therapy, [phone]- Gait training, Patient/Family education, Balance training, Stair training, Taping, Dry Needling, and Joint mobilization  PLAN FOR NEXT SESSION: HEP review and update, manual techniques as appropriate, aerobic tasks, ROM and flexibility activities, strengthening and PREs, TPDN, gait and balance training as needed    Date of referral: 12/08/2023 Referring provider: Eldred Manges, MD Referring diagnosis? M25.561,G89.29 (ICD-10-CM) - Chronic pain of right knee Treatment diagnosis? (if different than referring diagnosis) M25.561,G89.29 (ICD-10-CM) - Chronic pain of right knee  What was this (referring dx) caused by? Arthritis  Nature of Condition: Chronic (continuous duration > 3 months)   Laterality: Rt  Current Functional Measure Score: LEFS 52/80  Objective measurements identify impairments when they are compared to normal values, the uninvolved extremity, and prior level of function.  [x]  Yes  []  No  Objective assessment of functional ability: Minimal functional limitations   Briefly describe symptoms: retropatellar pain with stairs and prolonged walking  How did symptoms start: degenerative  Average pain intensity:  Last 24 hours: 2/10  Past week: 15/10  How often does the pt experience symptoms? Occasionally  How much have the symptoms interfered with usual daily activities? A little bit  How has condition changed since care began at this facility? NA - initial visit  In general, how is the patients overall health? Fair   BACK PAIN (STarT Back Screening Tool) No   Hildred Laser, PT 02/22/2024, 8:23 AM

## 2024-02-23 ENCOUNTER — Other Ambulatory Visit: Payer: Self-pay

## 2024-02-23 DIAGNOSIS — I83892 Varicose veins of left lower extremities with other complications: Secondary | ICD-10-CM

## 2024-02-23 NOTE — Therapy (Unsigned)
 OUTPATIENT PHYSICAL THERAPY TREATMENT NOTE   Patient Name: Travis Rollins MRN: [id number] DOB:06-19-1966, 58 y.o., male Today's Date: 02/24/2024  END OF SESSION:  PT End of Session - 02/24/24 1205     Visit Number 3    Number of Visits 12    Date for PT Re-Evaluation 04/18/24    Authorization Type [employer] MCR    PT Start Time 1200    PT Stop Time 1245    PT Time Calculation (min) 45 min    Activity Tolerance Patient tolerated treatment well    Behavior During Therapy [facility] for tasks assessed/performed              Past Medical History:  Diagnosis Date   Anemia    Carpal tunnel syndrome    Deep vein thrombosis (HCC) 07/2013, 10/2014, 03/16/2015   LLE (studies at [facility])   Depression    Diabetes mellitus    Hyperlipidemia    Hypertension    Obesity    Osteoarthritis    Peripheral vascular disease (HCC)    6 yrs ago, DVT in Lt knee/ groin   Pulmonary embolism (HCC)    hx of x 3 per patient; most recent 02/2014   Sinusitis    Sleep apnea    Past Surgical History:  Procedure Laterality Date   CARDIAC CATHETERIZATION N/A 08/10/2015   Procedure: Left Heart Cath and Coronary Angiography;  Surgeon: Yates Decamp, MD;  Location: [facility] INVASIVE CV LAB;  Service: Cardiovascular;  Laterality: N/A;   CYSTOSCOPY WITH RETROGRADE PYELOGRAM, URETEROSCOPY AND STENT PLACEMENT Bilateral 01/03/2015   Procedure: CYSTOSCOPY WITH RETROGRADE PYELOGRAM, RIGHT URETEROSCOPY AND RIGHT STENT PLACEMENT;  Surgeon: Sebastian Ache, MD;  Location: [facility];  Service: Urology;  Laterality: Bilateral;   CYSTOSCOPY WITH RETROGRADE PYELOGRAM, URETEROSCOPY AND STENT PLACEMENT Right 03/12/2016   Procedure: CYSTOSCOPY WITH RETROGRADE PYELOGRAM, URETEROSCOPY AND STENT PLACEMENT;  Surgeon: Sebastian Ache, MD;  Location: [facility];  Service: Urology;  Laterality: Right;   ENDOVENOUS ABLATION SAPHENOUS VEIN W/ LASER Left 04/20/2018   endovenous laser ablation left greater saphenous vein by Josephina Gip MD    HOLMIUM LASER  APPLICATION Right 01/03/2015   Procedure: HOLMIUM LASER APPLICATION;  Surgeon: Sebastian Ache, MD;  Location: [facility];  Service: Urology;  Laterality: Right;   HOLMIUM LASER APPLICATION Right 03/12/2016   Procedure: HOLMIUM LASER APPLICATION;  Surgeon: Sebastian Ache, MD;  Location: [facility];  Service: Urology;  Laterality: Right;   IVC filter   07/2014    TONSILLECTOMY     tubes removd from ears     Patient Active Problem List   Diagnosis Date Noted   Chondromalacia patellae, right knee 12/14/2023   Leukocytopenia 08/05/2021   Type 2 diabetes mellitus without complication, with long-term current use of insulin (HCC)    Bilateral pulmonary embolism (HCC) 05/20/2021   Coronary artery disease involving native coronary artery of native heart without angina pectoris 09/03/2020   Mixed hyperlipidemia 09/03/2020   Essential hypertension 09/03/2020   Chronic deep vein thrombosis (DVT) of popliteal vein of left lower extremity (HCC) 04/06/2018   Varicose veins of left lower extremity with complications 04/06/2018   Atypical chest pain 08/09/2015   Thrombocytopenia (HCC) 02/26/2014   Hyperkalemia 02/25/2014   Acute respiratory failure with hypoxia (HCC) 02/25/2014   Morbid obesity (HCC) 03/20/2012   DM (diabetes mellitus), type 2 with renal complications (HCC) 03/20/2012   Venous stasis 03/20/2012    PCP: Hillery Aldo, NP   REFERRING PROVIDER: Eldred Manges, MD  REFERRING DIAG:  M25.561,G89.29 (ICD-10-CM) - Chronic pain of right knee  THERAPY DIAG:  Chronic pain of right knee  Muscle weakness (generalized)  Rationale for Evaluation and Treatment: Rehabilitation  ONSET DATE: 10/13/2023  SUBJECTIVE:   SUBJECTIVE STATEMENT: Arrives to [facility] with increased R knee pain.  Left last session with little to no pain but then resumed hill walking and exacerbated R knee pain yesterday.  Symptoms persist today.  PERTINENT HISTORY: HPI 58 year old male with diabetes renal complications coronary  artery disease hypertension history of pulmonary embolisms still on Coumadin is seen with problems with knee pain going up stairs.  Knee feels weak he was having more pain in the left now having pain in his right.  He is ambulatory with a cane.  He has noted crepitus with knee extension great difficulty if he tries to squat. PAIN:  Are you having pain? Yes: NPRS scale: "15"/10 Pain location: B knees Pain description: ache, sore, sharp Aggravating factors: stairs, hills Relieving factors: rest  PRECAUTIONS: None  RED FLAGS: None   WEIGHT BEARING RESTRICTIONS: No  FALLS:  Has patient fallen in last 6 months? No  OCCUPATION:  n ot working  PLOF: Independent  PATIENT GOALS: To manage my knee pain  NEXT MD VISIT: 03/01/24  OBJECTIVE:  Note: Objective measures were completed at Evaluation unless otherwise noted.  DIAGNOSTIC FINDINGS: X-rays 10/13/2023 showed medial joint space narrowing bilaterally. Some calcification of the distal tuberosity suggesting mild Osgood-Schlatter's in the past. Irregularity of the patellofemoral joint.  PATIENT SURVEYS:  LEFS 52/80  MUSCLE LENGTH: Hamstrings: Right 80 deg; Left 80 deg  POSTURE:  ER at hips  PALPATION: unremarkable  LOWER EXTREMITY ROM: WNL in knees  Active ROM Right eval Left eval  Hip flexion    Hip extension    Hip abduction    Hip adduction    Hip internal rotation    Hip external rotation    Knee flexion    Knee extension    Ankle dorsiflexion    Ankle plantarflexion    Ankle inversion    Ankle eversion     (Blank rows = not tested)  LOWER EXTREMITY MMT:  MMT Right eval Left eval  Hip flexion    Hip extension    Hip abduction    Hip adduction    Hip internal rotation    Hip external rotation    Knee flexion 4   Knee extension 4   Ankle dorsiflexion    Ankle plantarflexion    Ankle inversion    Ankle eversion     (Blank rows = not tested)  LOWER EXTREMITY SPECIAL TESTS:  Knee special tests:  Patellafemoral apprehension test: negative, Lateral pull sign: positive , and Patellafemoral grind test: negative  FUNCTIONAL TESTS:  30 seconds chair stand test 4  GAIT: Distance walked: 71ft x2 Assistive device utilized: Single point cane Level of assistance: Complete Independence Comments: slow cadence, ER at hips  TREATMENT DATE:  [facility] Adult PT Treatment:                                                DATE: 02/24/24 Therapeutic Exercise: Nustep L2 8 min Neuromuscular re-ed: SAQs R 2x15 Supine hip fallouts RTB 15x B, 15/15 unilaterally Bridge against RTB 15x Bridge with ball squeeze 15x S/L clams RTB 15x B Therapeutic Activity: Standing heel raises 15x supported Seated hamstring stretch 30s x2 B  [facility] Adult PT Treatment:                                                DATE: 02/22/24 Therapeutic Exercise: Nustep L2 8 min Therapeutic Activity: FAQs 15x B Standing heel raise 15x Standing hamstring curls 15x B SAQs 15x B Bridge 15x SLR 15x B Seated hamstring stertch 30s x2 B   [facility] Adult PT Treatment:                                                DATE: 02/17/24 Eval and HEP Self Care: Additional minutes spent for educating on updated Therapeutic Home Exercise Program as well as comparing current status to condition at start of symptoms. This included exercises focusing on stretching, strengthening, with focus on eccentric aspects. Long term goals include an improvement in range of motion, strength, endurance as well as avoiding reinjury. Patient's frequency would include in 1-2 times a day, 3-5 times a week for a duration of 6-12 weeks. Proper technique shown and discussed handout in great detail. All questions were discussed and addressed.       PATIENT EDUCATION:  Education details: Discussed eval findings, rehab rationale and POC and patient is in  agreement  Person educated: Patient Education method: Explanation Education comprehension: verbalized understanding and needs further education  HOME EXERCISE PROGRAM: Access Code: [email or web address] URL: [email or web address] Date: 02/17/2024 Prepared by: Gustavus Bryant  Exercises - Supine Quad Set  - 1-2 x daily - 5 x weekly - 1-2 sets - 10 reps - 3s hold - Small Range Straight Leg Raise  - 1-2 x daily - 5 x weekly - 1-2 sets - 10 reps - 3s hold - Supine Knee Extension Strengthening  - 1-2 x daily - 5 x weekly - 1-2 sets - 10 reps - 3s hold - Clamshell  - 1-2 x daily - 5 x weekly - 1-2 sets - 10 reps - 3s hold  ASSESSMENT:  CLINICAL IMPRESSION: Patient arrives with exacerbation of knee pain from negotiating hills.  Holds knee in extended position with gait.  Focus of today's session was ROM, knee, hip and ankle strengthening.  Knee pain essentially resolved and motion restored following session.  Advised patient to refrain from traversing hills.  Patient is a 58 y.o. male who was seen today for physical therapy evaluation and treatment for chronic R knee pain due to underlying degenerative changes in PF joint. Patient presents with full ROM in R knee, good quad contraction, no extensor lag with SLR, good patellar mobility.  30s chair stand test finds LE weakness especially in hip abduction.  OBJECTIVE IMPAIRMENTS: decreased activity  tolerance, decreased endurance, decreased mobility, difficulty walking, decreased ROM, decreased strength, impaired perceived functional ability, impaired flexibility, obesity, and pain.   PERSONAL FACTORS: Age, Fitness, and Past/current experiences are also affecting patient's functional outcome.   REHAB POTENTIAL: Good  CLINICAL DECISION MAKING: Stable/uncomplicated  EVALUATION COMPLEXITY: Low   GOALS: Goals reviewed with patient? No   LONG TERM GOALS: Target date: 03/30/2024    Patient will score at least 64/80 on LEFS to signify  clinically meaningful improvement in functional abilities.   Baseline: 52/80 Goal status: INITIAL  2.  Patient will acknowledge 6/10 pain at least once during episode of care   Baseline: "15"/10 Goal status: INITIAL  3.  Patient will increase 30s chair stand reps from 4 to 7 with/without arms to demonstrate and improved functional ability with less pain/difficulty as well as reduce fall risk.  Baseline: 4 Goal status: INITIAL  4.  Patient to demonstrate independence in HEP Baseline: [id number] Goal status: INITIAL  5.  Increase R knee strength to 4+/5 Baseline: 4/5 Goal status: INITIAL     PLAN:  PT FREQUENCY: 2x/week  PT DURATION: 6 weeks  PLANNED INTERVENTIONS: 97164- PT Re-evaluation, 97110-Therapeutic exercises, 97530- Therapeutic activity, 97112- Neuromuscular re-education, 97535- Self Care, 16109- Manual therapy, [phone]- Gait training, Patient/Family education, Balance training, Stair training, Taping, Dry Needling, and Joint mobilization  PLAN FOR NEXT SESSION: HEP review and update, manual techniques as appropriate, aerobic tasks, ROM and flexibility activities, strengthening and PREs, TPDN, gait and balance training as needed    Date of referral: 12/08/2023 Referring provider: Eldred Manges, MD Referring diagnosis? M25.561,G89.29 (ICD-10-CM) - Chronic pain of right knee Treatment diagnosis? (if different than referring diagnosis) M25.561,G89.29 (ICD-10-CM) - Chronic pain of right knee  What was this (referring dx) caused by? Arthritis  Nature of Condition: Chronic (continuous duration > 3 months)   Laterality: Rt  Current Functional Measure Score: LEFS 52/80  Objective measurements identify impairments when they are compared to normal values, the uninvolved extremity, and prior level of function.  [x]  Yes  []  No  Objective assessment of functional ability: Minimal functional limitations   Briefly describe symptoms: retropatellar pain with stairs and prolonged  walking  How did symptoms start: degenerative  Average pain intensity:  Last 24 hours: 2/10  Past week: 15/10  How often does the pt experience symptoms? Occasionally  How much have the symptoms interfered with usual daily activities? A little bit  How has condition changed since care began at this facility? NA - initial visit  In general, how is the patients overall health? Fair   BACK PAIN (STarT Back Screening Tool) No   Hildred Laser, PT 02/24/2024, 1:09 PM

## 2024-02-24 ENCOUNTER — Ambulatory Visit

## 2024-02-24 DIAGNOSIS — M25561 Pain in right knee: Secondary | ICD-10-CM | POA: Diagnosis not present

## 2024-02-24 DIAGNOSIS — G8929 Other chronic pain: Secondary | ICD-10-CM

## 2024-02-24 DIAGNOSIS — M6281 Muscle weakness (generalized): Secondary | ICD-10-CM

## 2024-02-25 ENCOUNTER — Ambulatory Visit

## 2024-03-01 ENCOUNTER — Encounter: Payer: Self-pay | Admitting: Orthopaedic Surgery

## 2024-03-01 ENCOUNTER — Ambulatory Visit (INDEPENDENT_AMBULATORY_CARE_PROVIDER_SITE_OTHER): Payer: 59 | Admitting: Orthopaedic Surgery

## 2024-03-01 VITALS — BP 112/75 | HR 87 | Ht 67.0 in | Wt 259.0 lb

## 2024-03-01 DIAGNOSIS — M2241 Chondromalacia patellae, right knee: Secondary | ICD-10-CM | POA: Diagnosis not present

## 2024-03-01 NOTE — Progress Notes (Signed)
 Office Visit Note   Patient: Travis Rollins           Date of Birth: 1966-10-23           MRN: [id number] Visit Date: 03/01/2024              Requested by: Hillery Aldo, NP [address] [place],  [place] [address] PCP: Hillery Aldo, NP   Assessment & Plan: Visit Diagnoses:  1. Chondromalacia patellae, right knee     Plan: Patient's got good relief doing some quad strengthening exercises.  He is now avoiding going up and down the sharp hill which is a good idea with his current BMI and medical history.  He has no swelling if he has increased symptoms he can return and let us know.  Follow-Up Instructions: No follow-ups on file.   Orders:  No orders of the defined types were placed in this encounter.  No orders of the defined types were placed in this encounter.     Procedures: No procedures performed   Clinical Data: No additional findings.   Subjective: Chief Complaint  Patient presents with   Right Knee - Pain, Follow-up   Left Knee - Follow-up, Pain    HPI 58 year old male states he has had some pain in his knee for 2 weeks.  He normally walks around when he goes into his house but instead went up the hill at a fast pace and went up and down the hill and then started having pain in his knee, like his knee back need and locked and had sharp pain.  Have couple days where he could not walk now states its gotten better he is now using a cane and is getting around the community.  Patient did not actually fall.  He does not have any current swelling.  No history of gout.  Previous x-rays that showed some mild patellofemoral irregularity.  Review of Systems history coronary disease pulmonary embolism, obesity.  No current chest pain dyspnea.  All systems noncontributory to HPI.   Objective: Vital Signs: BP 112/75   Pulse 87   Ht 5\' 7"  (1.702 m)   Wt 259 lb (117.5 kg)   BMI 40.57 kg/m   Physical Exam Constitutional:      Appearance: He is well-developed.   HENT:     Head: Normocephalic and atraumatic.     Right Ear: External ear normal.     Left Ear: External ear normal.  Eyes:     Pupils: Pupils are equal, round, and reactive to light.  Neck:     Thyroid: No thyromegaly.     Trachea: No tracheal deviation.  Cardiovascular:     Rate and Rhythm: Normal rate.  Pulmonary:     Effort: Pulmonary effort is normal.     Breath sounds: No wheezing.  Abdominal:     General: Bowel sounds are normal.     Palpations: Abdomen is soft.  Musculoskeletal:     Cervical back: Neck supple.  Skin:    General: Skin is warm and dry.     Capillary Refill: Capillary refill takes less than 2 seconds.  Neurological:     Mental Status: He is alert and oriented to person, place, and time.  Psychiatric:        Behavior: Behavior normal.        Thought Content: Thought content normal.        Judgment: Judgment normal.     Ortho Exam patient has some tenderness laterally  along the lateral joint line.  No locking no pain with hyperextension mild crepitus with patellar tracking.  Negative logroll of the hips.  Iliotibial band is normal.  No popliteal masses.  Specialty Comments:  No specialty comments available.  Imaging: No results found.   PMFS History: Patient Active Problem List   Diagnosis Date Noted   Chondromalacia patellae, right knee 12/14/2023   Leukocytopenia 08/05/2021   Type 2 diabetes mellitus without complication, with long-term current use of insulin (HCC)    Bilateral pulmonary embolism (HCC) 05/20/2021   Coronary artery disease involving native coronary artery of native heart without angina pectoris 09/03/2020   Mixed hyperlipidemia 09/03/2020   Essential hypertension 09/03/2020   Chronic deep vein thrombosis (DVT) of popliteal vein of left lower extremity (HCC) 04/06/2018   Varicose veins of left lower extremity with complications 04/06/2018   Atypical chest pain 08/09/2015   Thrombocytopenia (HCC) 02/26/2014   Hyperkalemia  02/25/2014   Acute respiratory failure with hypoxia (HCC) 02/25/2014   Morbid obesity (HCC) 03/20/2012   DM (diabetes mellitus), type 2 with renal complications (HCC) 03/20/2012   Venous stasis 03/20/2012   Past Medical History:  Diagnosis Date   Anemia    Carpal tunnel syndrome    Deep vein thrombosis (HCC) 07/2013, 10/2014, 03/16/2015   LLE (studies at [facility]   Depression    Diabetes mellitus    Hyperlipidemia    Hypertension    Obesity    Osteoarthritis    Peripheral vascular disease (HCC)    6 yrs ago, DVT in Lt knee/ groin   Pulmonary embolism (HCC)    hx of x 3 per patient; most recent 02/2014   Sinusitis    Sleep apnea     Family History  Problem Relation Age of Onset   Kidney disease Father    Diabetes Father    Colon cancer Neg Hx    Esophageal cancer Neg Hx    Rectal cancer Neg Hx    Stomach cancer Neg Hx     Past Surgical History:  Procedure Laterality Date   CARDIAC CATHETERIZATION N/A 08/10/2015   Procedure: Left Heart Cath and Coronary Angiography;  Surgeon: Adra Shepler Decamp, MD;  Location: [facility] INVASIVE CV LAB;  Service: Cardiovascular;  Laterality: N/A;   CYSTOSCOPY WITH RETROGRADE PYELOGRAM, URETEROSCOPY AND STENT PLACEMENT Bilateral 01/03/2015   Procedure: CYSTOSCOPY WITH RETROGRADE PYELOGRAM, RIGHT URETEROSCOPY AND RIGHT STENT PLACEMENT;  Surgeon: Sebastian Ache, MD;  Location: [facility];  Service: Urology;  Laterality: Bilateral;   CYSTOSCOPY WITH RETROGRADE PYELOGRAM, URETEROSCOPY AND STENT PLACEMENT Right 03/12/2016   Procedure: CYSTOSCOPY WITH RETROGRADE PYELOGRAM, URETEROSCOPY AND STENT PLACEMENT;  Surgeon: Sebastian Ache, MD;  Location: [facility];  Service: Urology;  Laterality: Right;   ENDOVENOUS ABLATION SAPHENOUS VEIN W/ LASER Left 04/20/2018   endovenous laser ablation left greater saphenous vein by Josephina Gip MD    HOLMIUM LASER APPLICATION Right 01/03/2015   Procedure: HOLMIUM LASER APPLICATION;  Surgeon: Sebastian Ache, MD;  Location: [facility];  Service: Urology;   Laterality: Right;   HOLMIUM LASER APPLICATION Right 03/12/2016   Procedure: HOLMIUM LASER APPLICATION;  Surgeon: Sebastian Ache, MD;  Location: [facility];  Service: Urology;  Laterality: Right;   IVC filter   07/2014    TONSILLECTOMY     tubes removd from ears     Social History   Occupational History   Not on file  Tobacco Use   Smoking status: Never   Smokeless tobacco: Never  Vaping Use   Vaping status: Never  Used  Substance and Sexual Activity   Alcohol use: Never   Drug use: No   Sexual activity: Yes    Birth control/protection: Condom

## 2024-03-02 NOTE — Therapy (Signed)
 OUTPATIENT PHYSICAL THERAPY TREATMENT NOTE   Patient Name: Travis Rollins MRN: [id number] DOB:12-Jul-1966, 58 y.o., male Today's Date: 03/07/2024  END OF SESSION:  PT End of Session - 03/07/24 0835     Visit Number 4    Number of Visits 12    Date for PT Re-Evaluation 04/18/24    Authorization Type U[employer] MCR    PT Start Time 0830    PT Stop Time 0910    PT Time Calculation (min) 40 min    Activity Tolerance Patient tolerated treatment well    Behavior During Therapy [facility] for tasks assessed/performed               Past Medical History:  Diagnosis Date   Anemia    Carpal tunnel syndrome    Deep vein thrombosis (HCC) 07/2013, 10/2014, 03/16/2015   LLE (studies at [facility])   Depression    Diabetes mellitus    Hyperlipidemia    Hypertension    Obesity    Osteoarthritis    Peripheral vascular disease (HCC)    6 yrs ago, DVT in Lt knee/ groin   Pulmonary embolism (HCC)    hx of x 3 per patient; most recent 02/2014   Sinusitis    Sleep apnea    Past Surgical History:  Procedure Laterality Date   CARDIAC CATHETERIZATION N/A 08/10/2015   Procedure: Left Heart Cath and Coronary Angiography;  Surgeon: Yates Decamp, MD;  Location: [facility] INVASIVE CV LAB;  Service: Cardiovascular;  Laterality: N/A;   CYSTOSCOPY WITH RETROGRADE PYELOGRAM, URETEROSCOPY AND STENT PLACEMENT Bilateral 01/03/2015   Procedure: CYSTOSCOPY WITH RETROGRADE PYELOGRAM, RIGHT URETEROSCOPY AND RIGHT STENT PLACEMENT;  Surgeon: Sebastian Ache, MD;  Location: [facility] ORS;  Service: Urology;  Laterality: Bilateral;   CYSTOSCOPY WITH RETROGRADE PYELOGRAM, URETEROSCOPY AND STENT PLACEMENT Right 03/12/2016   Procedure: CYSTOSCOPY WITH RETROGRADE PYELOGRAM, URETEROSCOPY AND STENT PLACEMENT;  Surgeon: Sebastian Ache, MD;  Location: [facility];  Service: Urology;  Laterality: Right;   ENDOVENOUS ABLATION SAPHENOUS VEIN W/ LASER Left 04/20/2018   endovenous laser ablation left greater saphenous vein by Josephina Gip MD    HOLMIUM LASER  APPLICATION Right 01/03/2015   Procedure: HOLMIUM LASER APPLICATION;  Surgeon: Sebastian Ache, MD;  Location: [facility];  Service: Urology;  Laterality: Right;   HOLMIUM LASER APPLICATION Right 03/12/2016   Procedure: HOLMIUM LASER APPLICATION;  Surgeon: Sebastian Ache, MD;  Location: [facility];  Service: Urology;  Laterality: Right;   IVC filter   07/2014    TONSILLECTOMY     tubes removd from ears     Patient Active Problem List   Diagnosis Date Noted   Chondromalacia patellae, right knee 12/14/2023   Leukocytopenia 08/05/2021   Type 2 diabetes mellitus without complication, with long-term current use of insulin (HCC)    Bilateral pulmonary embolism (HCC) 05/20/2021   Coronary artery disease involving native coronary artery of native heart without angina pectoris 09/03/2020   Mixed hyperlipidemia 09/03/2020   Essential hypertension 09/03/2020   Chronic deep vein thrombosis (DVT) of popliteal vein of left lower extremity (HCC) 04/06/2018   Varicose veins of left lower extremity with complications 04/06/2018   Atypical chest pain 08/09/2015   Thrombocytopenia (HCC) 02/26/2014   Hyperkalemia 02/25/2014   Acute respiratory failure with hypoxia (HCC) 02/25/2014   Morbid obesity (HCC) 03/20/2012   DM (diabetes mellitus), type 2 with renal complications (HCC) 03/20/2012   Venous stasis 03/20/2012    PCP: Hillery Aldo, NP   REFERRING PROVIDER: Eldred Manges, MD  REFERRING  DIAG: M25.561,G89.29 (ICD-10-CM) - Chronic pain of right knee  THERAPY DIAG:  Chronic pain of right knee  Muscle weakness (generalized)  Rationale for Evaluation and Treatment: Rehabilitation  ONSET DATE: 10/13/2023  SUBJECTIVE:   SUBJECTIVE STATEMENT: Knee pain absent today, has not had any R knee pain since last PT session, arrives today w/o need of cane.  Goal is to strengthen knee to assist with stair negotiation.  PERTINENT HISTORY: HPI 58 year old male with diabetes renal complications coronary artery disease  hypertension history of pulmonary embolisms still on Coumadin is seen with problems with knee pain going up stairs.  Knee feels weak he was having more pain in the left now having pain in his right.  He is ambulatory with a cane.  He has noted crepitus with knee extension great difficulty if he tries to squat. PAIN:  Are you having pain? Yes: NPRS scale: "15"/10 Pain location: B knees Pain description: ache, sore, sharp Aggravating factors: stairs, hills Relieving factors: rest  PRECAUTIONS: None  RED FLAGS: None   WEIGHT BEARING RESTRICTIONS: No  FALLS:  Has patient fallen in last 6 months? No  OCCUPATION:  n ot working  PLOF: Independent  PATIENT GOALS: To manage my knee pain  NEXT MD VISIT: 03/01/24  OBJECTIVE:  Note: Objective measures were completed at Evaluation unless otherwise noted.  DIAGNOSTIC FINDINGS: X-rays 10/13/2023 showed medial joint space narrowing bilaterally. Some calcification of the distal tuberosity suggesting mild Osgood-Schlatter's in the past. Irregularity of the patellofemoral joint.  PATIENT SURVEYS:  LEFS 52/80  MUSCLE LENGTH: Hamstrings: Right 80 deg; Left 80 deg  POSTURE:  ER at hips  PALPATION: unremarkable  LOWER EXTREMITY ROM: WNL in knees  Active ROM Right eval Left eval  Hip flexion    Hip extension    Hip abduction    Hip adduction    Hip internal rotation    Hip external rotation    Knee flexion    Knee extension    Ankle dorsiflexion    Ankle plantarflexion    Ankle inversion    Ankle eversion     (Blank rows = not tested)  LOWER EXTREMITY MMT:  MMT Right eval Left eval  Hip flexion    Hip extension    Hip abduction    Hip adduction    Hip internal rotation    Hip external rotation    Knee flexion 4   Knee extension 4   Ankle dorsiflexion    Ankle plantarflexion    Ankle inversion    Ankle eversion     (Blank rows = not tested)  LOWER EXTREMITY SPECIAL TESTS:  Knee special tests: Patellafemoral  apprehension test: negative, Lateral pull sign: positive , and Patellafemoral grind test: negative  FUNCTIONAL TESTS:  30 seconds chair stand test 4  GAIT: Distance walked: 57ft x2 Assistive device utilized: Single point cane Level of assistance: Complete Independence Comments: slow cadence, ER at hips  TREATMENT DATE:  [facility] Adult PT Treatment:                                                DATE: 03/07/24 Therapeutic Exercise: Nustep L3 8 min Neuromuscular re-ed: SAQs R 2x15 3# Supine hip fallouts GTB 15x B, 15/15 unilaterally Bridge against GTB 15x Bridge with ball squeeze 15x S/L clams GTB 15x B Therapeutic Activity: Standing heel raises 15x supported Seated hamstring stretch 30s x2 B FAQ with ball squeeze  [facility] Adult PT Treatment:                                                DATE: 02/24/24 Therapeutic Exercise: Nustep L2 8 min Neuromuscular re-ed: SAQs R 2x15 Supine hip fallouts RTB 15x B, 15/15 unilaterally Bridge against RTB 15x Bridge with ball squeeze 15x S/L clams RTB 15x B Therapeutic Activity: Standing heel raises 15 from 4 in step Seated hamstring stretch 30s x2 B Runners step from 4 in step 10x B  [facility] Adult PT Treatment:                                                DATE: 02/22/24 Therapeutic Exercise: Nustep L2 8 min Therapeutic Activity: FAQs 15x B Standing heel raise 15x Standing hamstring curls 15x B SAQs 15x B Bridge 15x SLR 15x B Seated hamstring stertch 30s x2 B   [facility] Adult PT Treatment:                                                DATE: 02/17/24 Eval and HEP Self Care: Additional minutes spent for educating on updated Therapeutic Home Exercise Program as well as comparing current status to condition at start of symptoms. This included exercises focusing on stretching, strengthening, with focus on eccentric  aspects. Long term goals include an improvement in range of motion, strength, endurance as well as avoiding reinjury. Patient's frequency would include in 1-2 times a day, 3-5 times a week for a duration of 6-12 weeks. Proper technique shown and discussed handout in great detail. All questions were discussed and addressed.       PATIENT EDUCATION:  Education details: Discussed eval findings, rehab rationale and POC and patient is in agreement  Person educated: Patient Education method: Explanation Education comprehension: verbalized understanding and needs further education  HOME EXERCISE PROGRAM: Access Code: [email or web address] URL: [email or web address] Date: 02/17/2024 Prepared by: Gustavus Bryant  Exercises - Supine Quad Set  - 1-2 x daily - 5 x weekly - 1-2 sets - 10 reps - 3s hold - Small Range Straight Leg Raise  - 1-2 x daily - 5 x weekly - 1-2 sets - 10 reps - 3s hold - Supine Knee Extension Strengthening  - 1-2 x daily - 5 x weekly - 1-2 sets - 10 reps - 3s hold - Clamshell  - 1-2 x daily - 5 x weekly - 1-2 sets - 10 reps - 3s hold  ASSESSMENT:  CLINICAL IMPRESSION: Pain levels minimal.  Added weight and resistance as noted.  Incorporated more functional tasks including stepping, CKC tasks and proprioceptive work.  Patient is a 58 y.o. male who was seen today for physical therapy evaluation and treatment for chronic R knee pain due to underlying degenerative changes in PF joint. Patient presents with full ROM in R knee, good quad contraction, no extensor lag with SLR, good patellar mobility.  30s chair stand test finds LE weakness especially in hip abduction.  OBJECTIVE IMPAIRMENTS: decreased activity tolerance, decreased endurance, decreased mobility, difficulty walking, decreased ROM, decreased strength, impaired perceived functional ability, impaired flexibility, obesity, and pain.   PERSONAL FACTORS: Age, Fitness, and Past/current experiences are also affecting patient's  functional outcome.   REHAB POTENTIAL: Good  CLINICAL DECISION MAKING: Stable/uncomplicated  EVALUATION COMPLEXITY: Low   GOALS: Goals reviewed with patient? No   LONG TERM GOALS: Target date: 03/30/2024    Patient will score at least 64/80 on LEFS to signify clinically meaningful improvement in functional abilities.   Baseline: 52/80 Goal status: INITIAL  2.  Patient will acknowledge 6/10 pain at least once during episode of care   Baseline: "15"/10 Goal status: INITIAL  3.  Patient will increase 30s chair stand reps from 4 to 7 with/without arms to demonstrate and improved functional ability with less pain/difficulty as well as reduce fall risk.  Baseline: 4 Goal status: INITIAL  4.  Patient to demonstrate independence in HEP Baseline: [id number] Goal status: INITIAL  5.  Increase R knee strength to 4+/5 Baseline: 4/5 Goal status: INITIAL     PLAN:  PT FREQUENCY: 2x/week  PT DURATION: 6 weeks  PLANNED INTERVENTIONS: 97164- PT Re-evaluation, 97110-Therapeutic exercises, 97530- Therapeutic activity, 97112- Neuromuscular re-education, 97535- Self Care, 60454- Manual therapy, [id number]- Gait training, Patient/Family education, Balance training, Stair training, Taping, Dry Needling, and Joint mobilization  PLAN FOR NEXT SESSION: HEP review and update, manual techniques as appropriate, aerobic tasks, ROM and flexibility activities, strengthening and PREs, TPDN, gait and balance training as needed    Date of referral: 12/08/2023 Referring provider: Eldred Manges, MD Referring diagnosis? M25.561,G89.29 (ICD-10-CM) - Chronic pain of right knee Treatment diagnosis? (if different than referring diagnosis) M25.561,G89.29 (ICD-10-CM) - Chronic pain of right knee  What was this (referring dx) caused by? Arthritis  Nature of Condition: Chronic (continuous duration > 3 months)   Laterality: Rt  Current Functional Measure Score: LEFS 52/80  Objective measurements identify  impairments when they are compared to normal values, the uninvolved extremity, and prior level of function.  [x]  Yes  []  No  Objective assessment of functional ability: Minimal functional limitations   Briefly describe symptoms: retropatellar pain with stairs and prolonged walking  How did symptoms start: degenerative  Average pain intensity:  Last 24 hours: 2/10  Past week: 15/10  How often does the pt experience symptoms? Occasionally  How much have the symptoms interfered with usual daily activities? A little bit  How has condition changed since care began at this facility? NA - initial visit  In general, how is the patients overall health? Fair   BACK PAIN (STarT Back Screening Tool) No   Hildred Laser, PT 03/07/2024, 9:26 AM

## 2024-03-03 ENCOUNTER — Ambulatory Visit (HOSPITAL_COMMUNITY)
Admission: RE | Admit: 2024-03-03 | Discharge: 2024-03-03 | Disposition: A | Discharge status: Home / Self Care | Payer: 59 | Source: Ambulatory Visit | Attending: Vascular Surgery | Admitting: Vascular Surgery

## 2024-03-03 ENCOUNTER — Encounter: Payer: Self-pay | Admitting: Vascular Surgery

## 2024-03-03 ENCOUNTER — Ambulatory Visit (INDEPENDENT_AMBULATORY_CARE_PROVIDER_SITE_OTHER): Payer: 59 | Admitting: Vascular Surgery

## 2024-03-03 VITALS — BP 132/81 | HR 81 | Temp 98.2°F | Resp 22 | Ht 67.0 in | Wt 252.4 lb

## 2024-03-03 DIAGNOSIS — I83892 Varicose veins of left lower extremities with other complications: Secondary | ICD-10-CM | POA: Diagnosis present

## 2024-03-03 DIAGNOSIS — I872 Venous insufficiency (chronic) (peripheral): Secondary | ICD-10-CM | POA: Diagnosis not present

## 2024-03-03 DIAGNOSIS — I83891 Varicose veins of right lower extremities with other complications: Secondary | ICD-10-CM

## 2024-03-03 NOTE — Progress Notes (Signed)
 Office Note     CC: Right lower extremity healed venous stasis ulcer Requesting Provider:  Hillery Aldo, NP  HPI: Travis Rollins is a 58 y.o. ([birth year]) male who presents at the request of Hillery Aldo, NP for evaluation of bilateral lower extremity venous disease.  On exam, Travis Rollins was doing well, a native of [place], he moved to [place] with his father years ago.  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  Travis Rollins notes bilateral lower extremity edema that is affected his legs for decades.  He has appreciated skin changes and discoloration that have been present for over 10 years.  The swelling waxes and wanes.  It is worse in the evenings as compared to the mornings.  He recently had an ulceration on the right calf, which healed. Prior history of left-sided greater saphenous vein ablation with Dr. Hart Rochester in 2019.  Chronic, partially occlusive left popliteal DVT.   Past Medical History:  Diagnosis Date   Anemia    Carpal tunnel syndrome    Deep vein thrombosis (HCC) 07/2013, 10/2014, 03/16/2015   LLE (studies at [facility])   Depression    Diabetes mellitus    Hyperlipidemia    Hypertension    Obesity    Osteoarthritis    Peripheral vascular disease (HCC)    6 yrs ago, DVT in Lt knee/ groin   Pulmonary embolism (HCC)    hx of x 3 per patient; most recent 02/2014   Sinusitis    Sleep apnea     Past Surgical History:  Procedure Laterality Date   CARDIAC CATHETERIZATION N/A 08/10/2015   Procedure: Left Heart Cath and Coronary Angiography;  Surgeon: Yates Decamp, MD;  Location: [facility] INVASIVE CV LAB;  Service: Cardiovascular;  Laterality: N/A;   CYSTOSCOPY WITH RETROGRADE PYELOGRAM, URETEROSCOPY AND STENT PLACEMENT Bilateral 01/03/2015   Procedure: CYSTOSCOPY WITH RETROGRADE PYELOGRAM, RIGHT URETEROSCOPY AND RIGHT STENT PLACEMENT;  Surgeon: Sebastian Ache, MD;  Location: [facility];  Service: Urology;  Laterality: Bilateral;    CYSTOSCOPY WITH RETROGRADE PYELOGRAM, URETEROSCOPY AND STENT PLACEMENT Right 03/12/2016   Procedure: CYSTOSCOPY WITH RETROGRADE PYELOGRAM, URETEROSCOPY AND STENT PLACEMENT;  Surgeon: Sebastian Ache, MD;  Location: [facility];  Service: Urology;  Laterality: Right;   ENDOVENOUS ABLATION SAPHENOUS VEIN W/ LASER Left 04/20/2018   endovenous laser ablation left greater saphenous vein by Josephina Gip MD    HOLMIUM LASER APPLICATION Right 01/03/2015   Procedure: HOLMIUM LASER APPLICATION;  Surgeon: Sebastian Ache, MD;  Location: [facility];  Service: Urology;  Laterality: Right;   HOLMIUM LASER APPLICATION Right 03/12/2016   Procedure: HOLMIUM LASER APPLICATION;  Surgeon: Sebastian Ache, MD;  Location: [facility];  Service: Urology;  Laterality: Right;   IVC filter   07/2014    TONSILLECTOMY     tubes removd from ears      Social History   Socioeconomic History   Marital status: Single    Spouse name: Not on file   Number of children: 0   Years of education: Not on file   Highest education level: Not on file  Occupational History   Not on file  Tobacco Use   Smoking status: Never   Smokeless tobacco: Never  Vaping Use   Vaping status: Never Used  Substance and Sexual Activity   Alcohol use: Never   Drug use: No   Sexual activity: Yes    Birth control/protection: Condom  Other Topics Concern   Not on  file  Social History Narrative   Not on file   Social Drivers of Health   Financial Resource Strain: Not on file  Food Insecurity: Not on file  Transportation Needs: Not on file  Physical Activity: Not on file  Stress: Not on file  Social Connections: Unknown (06/30/2023)   Received from [facility]   Social Network    Social Network: Not on file  Intimate Partner Violence: Unknown (06/30/2023)   Received from [facility]   HITS    Physically Hurt: Not on file    Insult or Talk Down To: Not on file    Threaten Physical Harm: Not on file    Scream or Curse: Not on file   Family History   Problem Relation Age of Onset   Kidney disease Father    Diabetes Father    Colon cancer Neg Hx    Esophageal cancer Neg Hx    Rectal cancer Neg Hx    Stomach cancer Neg Hx     Current Outpatient Medications  Medication Sig Dispense Refill   allopurinol (ZYLOPRIM) 100 MG tablet Take 100 mg by mouth daily.     atorvastatin (LIPITOR) 40 MG tablet Take 40 mg by mouth at bedtime.      Capsicum, Cayenne, 455 MG CAPS Take 2 tablets by mouth in the morning and at bedtime.     ezetimibe (ZETIA) 10 MG tablet Take 10 mg by mouth daily.     furosemide (LASIX) 40 MG tablet Take 40 mg by mouth every morning.      Garlic 1000 MG CAPS Take 1 capsule by mouth in the morning and at bedtime.     lisinopril (PRINIVIL,ZESTRIL) 2.5 MG tablet Take 2.5 mg by mouth daily.     Magnesium 200 MG CHEW Chew 2 capsules by mouth in the morning and at bedtime.     metoprolol succinate (TOPROL-XL) 50 MG 24 hr tablet Take 50 mg by mouth at bedtime. Take with or immediately following a meal.     polycarbophil (FIBERCON) 625 MG tablet Take 625 mg by mouth in the morning, at noon, and at bedtime.     potassium chloride (K-DUR) 10 MEQ tablet Take 10 mEq by mouth at bedtime.      SYNJARDY XR 12.04-999 MG TB24 Take 1 tablet by mouth 2 (two) times daily.     tamsulosin (FLOMAX) 0.4 MG CAPS capsule Take 0.4 mg by mouth daily after breakfast.     TRESIBA FLEXTOUCH 200 UNIT/ML SOPN Inject 26 Units into the skin at bedtime.  3   triamcinolone cream (KENALOG) 0.1 % SMARTSIG:Sparingly Topical Twice Daily PRN     TRULICITY 3 MG/0.5ML SOPN As directed     Turmeric (QC TUMERIC COMPLEX) 500 MG CAPS Take 500 mg by mouth daily.     Ubiquinol 100 MG CAPS Take 1 capsule by mouth daily.     warfarin (COUMADIN) 4 MG tablet Take 8 mg by mouth daily.     No current facility-administered medications for this visit.    Allergies  Allergen Reactions   Penicillins Hives    Has patient had a PCN reaction causing immediate rash,  facial/tongue/throat swelling, SOB or lightheadedness with hypotension: No Has patient had a PCN reaction causing severe rash involving mucus membranes or skin necrosis: No Has patient had a PCN reaction that required hospitalization No Has patient had a PCN reaction occurring within the last 10 years: No If all of the above answers are "NO", then may proceed  with Cephalosporin use.     REVIEW OF SYSTEMS:  [X]  denotes positive finding, [ ]  denotes negative finding Cardiac  Comments:  Chest pain or chest pressure:    Shortness of breath upon exertion:    Short of breath when lying flat:    Irregular heart rhythm:        Vascular    Pain in calf, thigh, or hip brought on by ambulation:    Pain in feet at night that wakes you up from your sleep:     Blood clot in your veins:    Leg swelling:         Pulmonary    Oxygen at home:    Productive cough:     Wheezing:         Neurologic    Sudden weakness in arms or legs:     Sudden numbness in arms or legs:     Sudden onset of difficulty speaking or slurred speech:    Temporary loss of vision in one eye:     Problems with dizziness:         Gastrointestinal    Blood in stool:     Vomited blood:         Genitourinary    Burning when urinating:     Blood in urine:        Psychiatric    Major depression:         Hematologic    Bleeding problems:    Problems with blood clotting too easily:        Skin    Rashes or ulcers:        Constitutional    Fever or chills:      PHYSICAL EXAMINATION:  Vitals:   03/03/24 1156  BP: 132/81  Pulse: 81  Resp: (!) 22  Temp: 98.2 F (36.8 C)  TempSrc: Temporal  SpO2: 98%  Weight: 252 lb 6.4 oz (114.5 kg)  Height: 5\' 7"  (1.702 m)    General:  WDWN in NAD; vital signs documented above Gait: Not observed HENT: WNL, normocephalic Pulmonary: normal non-labored breathing , without Rales, rhonchi,  wheezing Cardiac: regular  Abdomen: soft, NT, no masses Skin: Lipo  dermatosclerosis Vascular Exam/Pulses:  Right Left  Radial 2+ (normal) 2+ (normal)  Ulnar    Femoral    Popliteal    DP 2+ (normal) 2+ (normal)  PT     Extremities: without ischemic changes, without Gangrene , without cellulitis; without open wounds;  Musculoskeletal: no muscle wasting or atrophy  Neurologic: A&O X 3;  No focal weakness or paresthesias are detected Psychiatric:  The pt has Normal affect.   Non-Invasive Vascular Imaging:   Venous Reflux Times  +--------------+---------+------+-----------+------------+--------+  RIGHT        Reflux NoRefluxReflux TimeDiameter cmsComments                          Yes                                   +--------------+---------+------+-----------+------------+--------+  CFV                    yes   >1 second                       +--------------+---------+------+-----------+------------+--------+  FV mid        no                                              +--------------+---------+------+-----------+------------+--------+  Popliteal    no                                              +--------------+---------+------+-----------+------------+--------+  GSV at SFJ              yes    >500 ms      .71               +--------------+---------+------+-----------+------------+--------+  GSV prox thigh          yes    >500 ms      .51               +--------------+---------+------+-----------+------------+--------+  GSV mid thigh           yes    >500 ms      .50               +--------------+---------+------+-----------+------------+--------+  GSV dist thigh          yes    >500 ms      .36               +--------------+---------+------+-----------+------------+--------+  GSV at knee   no                            .31               +--------------+---------+------+-----------+------------+--------+  GSV prox calf           yes    >500 ms      .35                +--------------+---------+------+-----------+------------+--------+  SSV Pop Fossa           yes    >500 ms      .47               +--------------+---------+------+-----------+------------+--------+  SSV prox calf no                            .25               +--------------+---------+------+-----------+------------+--------+              ASSESSMENT/PLAN:: 58 y.o. male presenting with chronic skin changes to bilateral lower extremities consistent with chronic venous insufficiency.  On physical exam, he had palpable pulses in the feet.  Chronic skin changes included lipodermatosclerosis, hemosiderin deposition. There is bilateral lower extremity edema, mild fibrosis.  CEAP 5 due to recently healed ulceration.  Travis Rollins and I had long discussion regarding the above.  He was not interested in pursuing surgery, stating that he was relatively asymptomatic, and the swelling was mild.  He simply asked for new compression stockings.  Legs were measured today in clinic, and he was given stockings.  At this point, he can follow-up with me as needed.  I asked that he call should legs worsen.  We discussed the importance of elevation and compression on a daily basis to prevent further venous stasis ulcerations.    Travis Sparrow, MD [facility] [phone] Total time of patient care including pre-visit research, consultation, and documentation greater than 30 minutes

## 2024-03-07 ENCOUNTER — Ambulatory Visit

## 2024-03-07 DIAGNOSIS — M6281 Muscle weakness (generalized): Secondary | ICD-10-CM

## 2024-03-07 DIAGNOSIS — G8929 Other chronic pain: Secondary | ICD-10-CM

## 2024-03-07 DIAGNOSIS — M25561 Pain in right knee: Secondary | ICD-10-CM | POA: Diagnosis not present

## 2024-03-07 NOTE — Therapy (Unsigned)
 OUTPATIENT PHYSICAL THERAPY TREATMENT NOTE   Patient Name: Travis Rollins MRN: [id number] DOB:[birth year], 58 y.o., male Today's Date: 03/11/2024  END OF SESSION:  PT End of Session - 03/11/24 1006     Visit Number 5    Number of Visits 12    Date for PT Re-Evaluation 04/18/24    Authorization Type [employer] MCR    PT Start Time 1000    PT Stop Time 1045    PT Time Calculation (min) 45 min    Activity Tolerance Patient tolerated treatment well    Behavior During Therapy [facility] for tasks assessed/performed                Past Medical History:  Diagnosis Date   Anemia    Carpal tunnel syndrome    Deep vein thrombosis (HCC) 07/2013, 10/2014, 03/16/2015   LLE (studies at [facility])   Depression    Diabetes mellitus    Hyperlipidemia    Hypertension    Obesity    Osteoarthritis    Peripheral vascular disease (HCC)    6 yrs ago, DVT in Lt knee/ groin   Pulmonary embolism (HCC)    hx of x 3 per patient; most recent 02/2014   Sinusitis    Sleep apnea    Past Surgical History:  Procedure Laterality Date   CARDIAC CATHETERIZATION N/A 08/10/2015   Procedure: Left Heart Cath and Coronary Angiography;  Surgeon: Yates Decamp, MD;  Location: [facility] INVASIVE CV LAB;  Service: Cardiovascular;  Laterality: N/A;   CYSTOSCOPY WITH RETROGRADE PYELOGRAM, URETEROSCOPY AND STENT PLACEMENT Bilateral 01/03/2015   Procedure: CYSTOSCOPY WITH RETROGRADE PYELOGRAM, RIGHT URETEROSCOPY AND RIGHT STENT PLACEMENT;  Surgeon: Sebastian Ache, MD;  Location: [facility];  Service: Urology;  Laterality: Bilateral;   CYSTOSCOPY WITH RETROGRADE PYELOGRAM, URETEROSCOPY AND STENT PLACEMENT Right 03/12/2016   Procedure: CYSTOSCOPY WITH RETROGRADE PYELOGRAM, URETEROSCOPY AND STENT PLACEMENT;  Surgeon: Sebastian Ache, MD;  Location: [facility];  Service: Urology;  Laterality: Right;   ENDOVENOUS ABLATION SAPHENOUS VEIN W/ LASER Left 04/20/2018   endovenous laser ablation left greater saphenous vein by Josephina Gip MD    HOLMIUM LASER  APPLICATION Right 01/03/2015   Procedure: HOLMIUM LASER APPLICATION;  Surgeon: Sebastian Ache, MD;  Location: [facility];  Service: Urology;  Laterality: Right;   HOLMIUM LASER APPLICATION Right 03/12/2016   Procedure: HOLMIUM LASER APPLICATION;  Surgeon: Sebastian Ache, MD;  Location: [facility];  Service: Urology;  Laterality: Right;   IVC filter   07/2014    TONSILLECTOMY     tubes removd from ears     Patient Active Problem List   Diagnosis Date Noted   Chondromalacia patellae, right knee 12/14/2023   Leukocytopenia 08/05/2021   Type 2 diabetes mellitus without complication, with long-term current use of insulin (HCC)    Bilateral pulmonary embolism (HCC) 05/20/2021   Coronary artery disease involving native coronary artery of native heart without angina pectoris 09/03/2020   Mixed hyperlipidemia 09/03/2020   Essential hypertension 09/03/2020   Chronic deep vein thrombosis (DVT) of popliteal vein of left lower extremity (HCC) 04/06/2018   Varicose veins of left lower extremity with complications 04/06/2018   Atypical chest pain 08/09/2015   Thrombocytopenia (HCC) 02/26/2014   Hyperkalemia 02/25/2014   Acute respiratory failure with hypoxia (HCC) 02/25/2014   Morbid obesity (HCC) 03/20/2012   DM (diabetes mellitus), type 2 with renal complications (HCC) 03/20/2012   Venous stasis 03/20/2012    PCP: Hillery Aldo, NP   REFERRING PROVIDER: Eldred Manges, MD  REFERRING DIAG: M25.561,G89.29 (ICD-10-CM) - Chronic pain of right knee  THERAPY DIAG:  Chronic pain of right knee  Muscle weakness (generalized)  Rationale for Evaluation and Treatment: Rehabilitation  ONSET DATE: 10/13/2023  SUBJECTIVE:   SUBJECTIVE STATEMENT: R knee pain continues to remain minimal.    PERTINENT HISTORY: HPI 58 year old male with diabetes renal complications coronary artery disease hypertension history of pulmonary embolisms still on Coumadin is seen with problems with knee pain going up stairs.  Knee feels  weak he was having more pain in the left now having pain in his right.  He is ambulatory with a cane.  He has noted crepitus with knee extension great difficulty if he tries to squat. PAIN:  Are you having pain? Yes: NPRS scale: "15"/10 Pain location: B knees Pain description: ache, sore, sharp Aggravating factors: stairs, hills Relieving factors: rest  PRECAUTIONS: None  RED FLAGS: None   WEIGHT BEARING RESTRICTIONS: No  FALLS:  Has patient fallen in last 6 months? No  OCCUPATION:  n ot working  PLOF: Independent  PATIENT GOALS: To manage my knee pain  NEXT MD VISIT: 03/01/24  OBJECTIVE:  Note: Objective measures were completed at Evaluation unless otherwise noted.  DIAGNOSTIC FINDINGS: X-rays 10/13/2023 showed medial joint space narrowing bilaterally. Some calcification of the distal tuberosity suggesting mild Osgood-Schlatter's in the past. Irregularity of the patellofemoral joint.  PATIENT SURVEYS:  LEFS 52/80  MUSCLE LENGTH: Hamstrings: Right 80 deg; Left 80 deg  POSTURE:  ER at hips  PALPATION: unremarkable  LOWER EXTREMITY ROM: WNL in knees  Active ROM Right eval Left eval  Hip flexion    Hip extension    Hip abduction    Hip adduction    Hip internal rotation    Hip external rotation    Knee flexion    Knee extension    Ankle dorsiflexion    Ankle plantarflexion    Ankle inversion    Ankle eversion     (Blank rows = not tested)  LOWER EXTREMITY MMT:  MMT Right eval Left eval  Hip flexion    Hip extension    Hip abduction    Hip adduction    Hip internal rotation    Hip external rotation    Knee flexion 4   Knee extension 4   Ankle dorsiflexion    Ankle plantarflexion    Ankle inversion    Ankle eversion     (Blank rows = not tested)  LOWER EXTREMITY SPECIAL TESTS:  Knee special tests: Patellafemoral apprehension test: negative, Lateral pull sign: positive , and Patellafemoral grind test: negative  FUNCTIONAL TESTS:  30 seconds  chair stand test 4  GAIT: Distance walked: 51ft x2 Assistive device utilized: Single point cane Level of assistance: Complete Independence Comments: slow cadence, ER at hips  TREATMENT DATE:  [facility] Adult PT Treatment:                                                DATE: 03/11/24 Therapeutic Exercise: Nustep L4 8 min Neuromuscular re-ed: SAQs R 2x15 4# Supine hip fallouts BluTB 10x B, 10/10 unilaterally Bridge against BluTB 10x Bridge with ball squeeze 15x S/L clams BluTB 10x B Therapeutic Activity: Standing heel raises 15x against wall Seated hamstring stretch 30s x2 B FAQ with ball squeeze 15x Seated R hamstring curls RTB  15x2 STS arms crossed 10x  [facility] Adult PT Treatment:                                                DATE: 03/07/24 Therapeutic Exercise: Nustep L3 8 min Neuromuscular re-ed: SAQs R 2x15 3# Supine hip fallouts GTB 15x B, 15/15 unilaterally Bridge against GTB 15x Bridge with ball squeeze 15x S/L clams GTB 15x B Therapeutic Activity: Standing heel raises 15x supported Seated hamstring stretch 30s x2 B FAQ with ball squeeze 15x  [facility] Adult PT Treatment:                                                DATE: 02/24/24 Therapeutic Exercise: Nustep L2 8 min Neuromuscular re-ed: SAQs R 2x15 Supine hip fallouts RTB 15x B, 15/15 unilaterally Bridge against RTB 15x Bridge with ball squeeze 15x S/L clams RTB 15x B  Therapeutic Activity: Standing heel raises 15 from 4 in step Seated hamstring stretch 30s x2 B Runners step from 4 in step 10x B Seated R hamstring curls RTB  [facility] Adult PT Treatment:                                                DATE: 02/22/24 Therapeutic Exercise: Nustep L2 8 min Therapeutic Activity: FAQs 15x B Standing heel raise 15x Standing hamstring curls 15x B SAQs 15x B Bridge 15x SLR 15x B Seated hamstring  stertch 30s x2 B   [facility] Adult PT Treatment:                                                DATE: 02/17/24 Eval and HEP Self Care: Additional minutes spent for educating on updated Therapeutic Home Exercise Program as well as comparing current status to condition at start of symptoms. This included exercises focusing on stretching, strengthening, with focus on eccentric aspects. Long term goals include an improvement in range of motion, strength, endurance as well as avoiding reinjury. Patient's frequency would include in 1-2 times a day, 3-5 times a week for a duration of 6-12 weeks. Proper technique shown and discussed handout in great detail. All questions were discussed and addressed.       PATIENT EDUCATION:  Education details: Discussed eval findings, rehab rationale and POC and patient is in  agreement  Person educated: Patient Education method: Explanation Education comprehension: verbalized understanding and needs further education  HOME EXERCISE PROGRAM: Access Code: [email or web address] URL: [email or web address] Date: 02/17/2024 Prepared by: Gustavus Bryant  Exercises - Supine Quad Set  - 1-2 x daily - 5 x weekly - 1-2 sets - 10 reps - 3s hold - Small Range Straight Leg Raise  - 1-2 x daily - 5 x weekly - 1-2 sets - 10 reps - 3s hold - Supine Knee Extension Strengthening  - 1-2 x daily - 5 x weekly - 1-2 sets - 10 reps - 3s hold - Clamshell  - 1-2 x daily - 5 x weekly - 1-2 sets - 10 reps - 3s hold  ASSESSMENT:  CLINICAL IMPRESSION: Continued to increase workload as tolerated.  Added hamstring strengthening, increased difficulty of tasks with added resistance, introduced STS.  Good tolerance to all tasks today and will continue to challenge patient.  Patient is a 58 y.o. male who was seen today for physical therapy evaluation and treatment for chronic R knee pain due to underlying degenerative changes in PF joint. Patient presents with full ROM in R knee, good quad  contraction, no extensor lag with SLR, good patellar mobility.  30s chair stand test finds LE weakness especially in hip abduction.  OBJECTIVE IMPAIRMENTS: decreased activity tolerance, decreased endurance, decreased mobility, difficulty walking, decreased ROM, decreased strength, impaired perceived functional ability, impaired flexibility, obesity, and pain.   PERSONAL FACTORS: Age, Fitness, and Past/current experiences are also affecting patient's functional outcome.   REHAB POTENTIAL: Good  CLINICAL DECISION MAKING: Stable/uncomplicated  EVALUATION COMPLEXITY: Low   GOALS: Goals reviewed with patient? No   LONG TERM GOALS: Target date: 03/30/2024    Patient will score at least 64/80 on LEFS to signify clinically meaningful improvement in functional abilities.   Baseline: 52/80 Goal status: INITIAL  2.  Patient will acknowledge 6/10 pain at least once during episode of care   Baseline: "15"/10 Goal status: INITIAL  3.  Patient will increase 30s chair stand reps from 4 to 7 with/without arms to demonstrate and improved functional ability with less pain/difficulty as well as reduce fall risk.  Baseline: 4 Goal status: INITIAL  4.  Patient to demonstrate independence in HEP Baseline: [id number] Goal status: INITIAL  5.  Increase R knee strength to 4+/5 Baseline: 4/5 Goal status: INITIAL     PLAN:  PT FREQUENCY: 2x/week  PT DURATION: 6 weeks  PLANNED INTERVENTIONS: 97164- PT Re-evaluation, 97110-Therapeutic exercises, 97530- Therapeutic activity, 97112- Neuromuscular re-education, 97535- Self Care, 10272- Manual therapy, [phone]- Gait training, Patient/Family education, Balance training, Stair training, Taping, Dry Needling, and Joint mobilization  PLAN FOR NEXT SESSION: HEP review and update, manual techniques as appropriate, aerobic tasks, ROM and flexibility activities, strengthening and PREs, TPDN, gait and balance training as needed    Date of referral:  12/08/2023 Referring provider: Eldred Manges, MD Referring diagnosis? M25.561,G89.29 (ICD-10-CM) - Chronic pain of right knee Treatment diagnosis? (if different than referring diagnosis) M25.561,G89.29 (ICD-10-CM) - Chronic pain of right knee  What was this (referring dx) caused by? Arthritis  Nature of Condition: Chronic (continuous duration > 3 months)   Laterality: Rt  Current Functional Measure Score: LEFS 52/80  Objective measurements identify impairments when they are compared to normal values, the uninvolved extremity, and prior level of function.  [x]  Yes  []  No  Objective assessment of functional ability: Minimal functional limitations   Briefly describe symptoms: retropatellar pain with stairs and prolonged walking  How  did symptoms start: degenerative  Average pain intensity:  Last 24 hours: 2/10  Past week: 15/10  How often does the pt experience symptoms? Occasionally  How much have the symptoms interfered with usual daily activities? A little bit  How has condition changed since care began at this facility? NA - initial visit  In general, how is the patients overall health? Fair   BACK PAIN (STarT Back Screening Tool) No   Hildred Laser, PT 03/11/2024, 10:51 AM

## 2024-03-11 ENCOUNTER — Ambulatory Visit: Attending: Orthopaedic Surgery

## 2024-03-11 DIAGNOSIS — M6281 Muscle weakness (generalized): Secondary | ICD-10-CM | POA: Diagnosis present

## 2024-03-11 DIAGNOSIS — M25561 Pain in right knee: Secondary | ICD-10-CM | POA: Insufficient documentation

## 2024-03-11 DIAGNOSIS — G8929 Other chronic pain: Secondary | ICD-10-CM | POA: Diagnosis present

## 2024-03-14 NOTE — Therapy (Unsigned)
 OUTPATIENT PHYSICAL THERAPY TREATMENT NOTE   Patient Name: Travis Rollins MRN: [id number] DOB:[birth year], 58 y.o., male Today's Date: 03/15/2024  END OF SESSION:  PT End of Session - 03/15/24 1130     Visit Number 6    Number of Visits 12    Date for PT Re-Evaluation 04/18/24    Authorization Type [employer] MCR    PT Start Time 1130    PT Stop Time 1210    PT Time Calculation (min) 40 min    Activity Tolerance Patient tolerated treatment well    Behavior During Therapy [facility] for tasks assessed/performed                 Past Medical History:  Diagnosis Date   Anemia    Carpal tunnel syndrome    Deep vein thrombosis (HCC) 07/2013, 10/2014, 03/16/2015   LLE (studies at [facility])   Depression    Diabetes mellitus    Hyperlipidemia    Hypertension    Obesity    Osteoarthritis    Peripheral vascular disease (HCC)    6 yrs ago, DVT in Lt knee/ groin   Pulmonary embolism (HCC)    hx of x 3 per patient; most recent 02/2014   Sinusitis    Sleep apnea    Past Surgical History:  Procedure Laterality Date   CARDIAC CATHETERIZATION N/A 08/10/2015   Procedure: Left Heart Cath and Coronary Angiography;  Surgeon: Yates Decamp, MD;  Location: [facility] INVASIVE CV LAB;  Service: Cardiovascular;  Laterality: N/A;   CYSTOSCOPY WITH RETROGRADE PYELOGRAM, URETEROSCOPY AND STENT PLACEMENT Bilateral 01/03/2015   Procedure: CYSTOSCOPY WITH RETROGRADE PYELOGRAM, RIGHT URETEROSCOPY AND RIGHT STENT PLACEMENT;  Surgeon: Sebastian Ache, MD;  Location: [facility] OR[facility];  Service: Urology;  Laterality: Bilateral;   CYSTOSCOPY WITH RETROGRADE PYELOGRAM, URETEROSCOPY AND STENT PLACEMENT Right 03/12/2016   Procedure: CYSTOSCOPY WITH RETROGRADE PYELOGRAM, URETEROSCOPY AND STENT PLACEMENT;  Surgeon: Sebastian Ache, MD;  Location: [facility];  Service: Urology;  Laterality: Right;   ENDOVENOUS ABLATION SAPHENOUS VEIN W/ LASER Left 04/20/2018   endovenous laser ablation left greater saphenous vein by Josephina Gip MD    HOLMIUM LASER  APPLICATION Right 01/03/2015   Procedure: HOLMIUM LASER APPLICATION;  Surgeon: Sebastian Ache, MD;  Location: [facility];  Service: Urology;  Laterality: Right;   HOLMIUM LASER APPLICATION Right 03/12/2016   Procedure: HOLMIUM LASER APPLICATION;  Surgeon: Sebastian Ache, MD;  Location: [facility];  Service: Urology;  Laterality: Right;   IVC filter   07/2014    TONSILLECTOMY     tubes removd from ears     Patient Active Problem List   Diagnosis Date Noted   Chondromalacia patellae, right knee 12/14/2023   Leukocytopenia 08/05/2021   Type 2 diabetes mellitus without complication, with long-term current use of insulin (HCC)    Bilateral pulmonary embolism (HCC) 05/20/2021   Coronary artery disease involving native coronary artery of native heart without angina pectoris 09/03/2020   Mixed hyperlipidemia 09/03/2020   Essential hypertension 09/03/2020   Chronic deep vein thrombosis (DVT) of popliteal vein of left lower extremity (HCC) 04/06/2018   Varicose veins of left lower extremity with complications 04/06/2018   Atypical chest pain 08/09/2015   Thrombocytopenia (HCC) 02/26/2014   Hyperkalemia 02/25/2014   Acute respiratory failure with hypoxia (HCC) 02/25/2014   Morbid obesity (HCC) 03/20/2012   DM (diabetes mellitus), type 2 with renal complications (HCC) 03/20/2012   Venous stasis 03/20/2012    PCP: Hillery Aldo, NP   REFERRING PROVIDER: Eldred Manges, MD  REFERRING DIAG: M25.561,G89.29 (ICD-10-CM) - Chronic pain of right knee  THERAPY DIAG:  Chronic pain of right knee  Muscle weakness (generalized)  Rationale for Evaluation and Treatment: Rehabilitation  ONSET DATE: 10/13/2023  SUBJECTIVE:   SUBJECTIVE STATEMENT: No pain reported today. Patient appears quiet from usual demeanor.   PERTINENT HISTORY: HPI 58 year old male with diabetes renal complications coronary artery disease hypertension history of pulmonary embolisms still on Coumadin is seen with problems with knee pain  going up stairs.  Knee feels weak he was having more pain in the left now having pain in his right.  He is ambulatory with a cane.  He has noted crepitus with knee extension great difficulty if he tries to squat. PAIN:  Are you having pain? Yes: NPRS scale: "15"/10 Pain location: B knees Pain description: ache, sore, sharp Aggravating factors: stairs, hills Relieving factors: rest  PRECAUTIONS: None  RED FLAGS: None   WEIGHT BEARING RESTRICTIONS: No  FALLS:  Has patient fallen in last 6 months? No  OCCUPATION:  n ot working  PLOF: Independent  PATIENT GOALS: To manage my knee pain  NEXT MD VISIT: 03/01/24  OBJECTIVE:  Note: Objective measures were completed at Evaluation unless otherwise noted.  DIAGNOSTIC FINDINGS: X-rays 10/13/2023 showed medial joint space narrowing bilaterally. Some calcification of the distal tuberosity suggesting mild Osgood-Schlatter's in the past. Irregularity of the patellofemoral joint.  PATIENT SURVEYS:  LEFS 52/80  MUSCLE LENGTH: Hamstrings: Right 80 deg; Left 80 deg  POSTURE:  ER at hips  PALPATION: unremarkable  LOWER EXTREMITY ROM: WNL in knees  Active ROM Right eval Left eval  Hip flexion    Hip extension    Hip abduction    Hip adduction    Hip internal rotation    Hip external rotation    Knee flexion    Knee extension    Ankle dorsiflexion    Ankle plantarflexion    Ankle inversion    Ankle eversion     (Blank rows = not tested)  LOWER EXTREMITY MMT:  MMT Right eval Left eval  Hip flexion    Hip extension    Hip abduction    Hip adduction    Hip internal rotation    Hip external rotation    Knee flexion 4   Knee extension 4   Ankle dorsiflexion    Ankle plantarflexion    Ankle inversion    Ankle eversion     (Blank rows = not tested)  LOWER EXTREMITY SPECIAL TESTS:  Knee special tests: Patellafemoral apprehension test: negative, Lateral pull sign: positive , and Patellafemoral grind test:  negative  FUNCTIONAL TESTS:  30 seconds chair stand test 4  GAIT: Distance walked: 60ft x2 Assistive device utilized: Single point cane Level of assistance: Complete Independence Comments: slow cadence, ER at hips  TREATMENT DATE:  [facility] Adult PT Treatment:                                                DATE: 03/15/24 Therapeutic Exercise: Nustep L5 8 min Neuromuscular re-ed: SAQs R 2x15 5# Supine hip fallouts BluTB 15x B, 15/15 unilaterally Bridge against BluTB 15x Bridge with ball squeeze 15x S/L clams BluTB 15x B Therapeutic Activity: Standing heel raises 10x from 4 in block Seated hamstring stretch 30s x2 B FAQ with ball squeeze 15x Seated R hamstring curls GTB  15x2  [facility] Adult PT Treatment:                                                DATE: 03/11/24 Therapeutic Exercise: Nustep L4 8 min Neuromuscular re-ed: SAQs R 2x15 4# Supine hip fallouts BluTB 10x B, 10/10 unilaterally Bridge against BluTB 10x Bridge with ball squeeze 15x S/L clams BluTB 10x B Therapeutic Activity: Standing heel raises 15x against wall Seated hamstring stretch 30s x2 B FAQ with ball squeeze 15x Seated R hamstring curls RTB  15x2 STS arms crossed 10x  [facility] Adult PT Treatment:                                                DATE: 03/07/24 Therapeutic Exercise: Nustep L3 8 min Neuromuscular re-ed: SAQs R 2x15 3# Supine hip fallouts GTB 15x B, 15/15 unilaterally Bridge against GTB 15x Bridge with ball squeeze 15x S/L clams GTB 15x B Therapeutic Activity: Standing heel raises 15x supported Seated hamstring stretch 30s x2 B FAQ with ball squeeze 15x  [facility] Adult PT Treatment:                                                DATE: 02/24/24 Therapeutic Exercise: Nustep L2 8 min Neuromuscular re-ed: SAQs R 2x15 Supine hip fallouts RTB 15x B, 15/15 unilaterally Bridge  against RTB 15x Bridge with ball squeeze 15x S/L clams RTB 15x B  Therapeutic Activity: Standing heel raises 15 from 4 in step Seated hamstring stretch 30s x2 B Runners step from 4 in step 10x B Seated R hamstring curls RTB  [facility] Adult PT Treatment:                                                DATE: 02/22/24 Therapeutic Exercise: Nustep L2 8 min Therapeutic Activity: FAQs 15x B Standing heel raise 15x Standing hamstring curls 15x B SAQs 15x B Bridge 15x SLR 15x B Seated hamstring stertch 30s x2 B   [facility] Adult PT Treatment:  DATE: 02/17/24 Eval and HEP Self Care: Additional minutes spent for educating on updated Therapeutic Home Exercise Program as well as comparing current status to condition at start of symptoms. This included exercises focusing on stretching, strengthening, with focus on eccentric aspects. Long term goals include an improvement in range of motion, strength, endurance as well as avoiding reinjury. Patient's frequency would include in 1-2 times a day, 3-5 times a week for a duration of 6-12 weeks. Proper technique shown and discussed handout in great detail. All questions were discussed and addressed.       PATIENT EDUCATION:  Education details: Discussed eval findings, rehab rationale and POC and patient is in agreement  Person educated: Patient Education method: Explanation Education comprehension: verbalized understanding and needs further education  HOME EXERCISE PROGRAM: Access Code: [email or web address] URL: [email or web address] Date: 02/17/2024 Prepared by: Gustavus Bryant  Exercises - Supine Quad Set  - 1-2 x daily - 5 x weekly - 1-2 sets - 10 reps - 3s hold - Small Range Straight Leg Raise  - 1-2 x daily - 5 x weekly - 1-2 sets - 10 reps - 3s hold - Supine Knee Extension Strengthening  - 1-2 x daily - 5 x weekly - 1-2 sets - 10 reps - 3s hold - Clamshell  - 1-2 x daily - 5 x weekly - 1-2 sets - 10  reps - 3s hold  ASSESSMENT:  CLINICAL IMPRESSION: Continued to increase workload as tolerated.  Transitioned to more resistive band and increased weight.  Introduced heel raises off 4 in block for challenge but held reps at 10.  Patient progressing well and will begin more CKC tasks in future sessions.  Patient is a 58 y.o. male who was seen today for physical therapy evaluation and treatment for chronic R knee pain due to underlying degenerative changes in PF joint. Patient presents with full ROM in R knee, good quad contraction, no extensor lag with SLR, good patellar mobility.  30s chair stand test finds LE weakness especially in hip abduction.  OBJECTIVE IMPAIRMENTS: decreased activity tolerance, decreased endurance, decreased mobility, difficulty walking, decreased ROM, decreased strength, impaired perceived functional ability, impaired flexibility, obesity, and pain.   PERSONAL FACTORS: Age, Fitness, and Past/current experiences are also affecting patient's functional outcome.   REHAB POTENTIAL: Good  CLINICAL DECISION MAKING: Stable/uncomplicated  EVALUATION COMPLEXITY: Low   GOALS: Goals reviewed with patient? No   LONG TERM GOALS: Target date: 03/30/2024    Patient will score at least 64/80 on LEFS to signify clinically meaningful improvement in functional abilities.   Baseline: 52/80 Goal status: INITIAL  2.  Patient will acknowledge 6/10 pain at least once during episode of care   Baseline: "15"/10 Goal status: INITIAL  3.  Patient will increase 30s chair stand reps from 4 to 7 with/without arms to demonstrate and improved functional ability with less pain/difficulty as well as reduce fall risk.  Baseline: 4 Goal status: INITIAL  4.  Patient to demonstrate independence in HEP Baseline: [id number] Goal status: INITIAL  5.  Increase R knee strength to 4+/5 Baseline: 4/5 Goal status: INITIAL     PLAN:  PT FREQUENCY: 2x/week  PT DURATION: 6 weeks  PLANNED  INTERVENTIONS: 97164- PT Re-evaluation, 97110-Therapeutic exercises, 97530- Therapeutic activity, 97112- Neuromuscular re-education, 97535- Self Care, 29562- Manual therapy, [phone]- Gait training, Patient/Family education, Balance training, Stair training, Taping, Dry Needling, and Joint mobilization  PLAN FOR NEXT SESSION: HEP review and update, manual techniques as appropriate, aerobic tasks, ROM and flexibility activities, strengthening  and PREs, TPDN, gait and balance training as needed    Date of referral: 12/08/2023 Referring provider: Eldred Manges, MD Referring diagnosis? M25.561,G89.29 (ICD-10-CM) - Chronic pain of right knee Treatment diagnosis? (if different than referring diagnosis) M25.561,G89.29 (ICD-10-CM) - Chronic pain of right knee  What was this (referring dx) caused by? Arthritis  Nature of Condition: Chronic (continuous duration > 3 months)   Laterality: Rt  Current Functional Measure Score: LEFS 52/80  Objective measurements identify impairments when they are compared to normal values, the uninvolved extremity, and prior level of function.  [x]  Yes  []  No  Objective assessment of functional ability: Minimal functional limitations   Briefly describe symptoms: retropatellar pain with stairs and prolonged walking  How did symptoms start: degenerative  Average pain intensity:  Last 24 hours: 2/10  Past week: 15/10  How often does the pt experience symptoms? Occasionally  How much have the symptoms interfered with usual daily activities? A little bit  How has condition changed since care began at this facility? NA - initial visit  In general, how is the patients overall health? Fair   BACK PAIN (STarT Back Screening Tool) No   Hildred Laser, PT 03/15/2024, 12:13 PM

## 2024-03-15 ENCOUNTER — Ambulatory Visit

## 2024-03-15 DIAGNOSIS — M25561 Pain in right knee: Secondary | ICD-10-CM | POA: Diagnosis not present

## 2024-03-15 DIAGNOSIS — M6281 Muscle weakness (generalized): Secondary | ICD-10-CM

## 2024-03-15 DIAGNOSIS — G8929 Other chronic pain: Secondary | ICD-10-CM

## 2024-03-16 NOTE — Therapy (Addendum)
 " OUTPATIENT PHYSICAL THERAPY TREATMENT NOTE/DISCHARGE   Patient Name: Travis Rollins MRN: [id number] DOB:08/09/1966, 58 y.o., male Today's Date: 03/18/2024  END OF SESSION:  PT End of Session - 03/18/24 1001     Visit Number 7    Number of Visits 12    Date for PT Re-Evaluation 04/18/24    Authorization Type [employer] MCR    PT Start Time 1000    PT Stop Time 1040    PT Time Calculation (min) 40 min    Activity Tolerance Patient tolerated treatment well    Behavior During Therapy [facility] for tasks assessed/performed                  Past Medical History:  Diagnosis Date   Anemia    Carpal tunnel syndrome    Deep vein thrombosis (HCC) 07/2013, 10/2014, 03/16/2015   LLE (studies at [facility])   Depression    Diabetes mellitus    Hyperlipidemia    Hypertension    Obesity    Osteoarthritis    Peripheral vascular disease (HCC)    6 yrs ago, DVT in Lt knee/ groin   Pulmonary embolism (HCC)    hx of x 3 per patient; most recent 02/2014   Sinusitis    Sleep apnea    Past Surgical History:  Procedure Laterality Date   CARDIAC CATHETERIZATION N/A 08/10/2015   Procedure: Left Heart Cath and Coronary Angiography;  Surgeon: Gordy Bergamo, MD;  Location: [facility] INVASIVE CV LAB;  Service: Cardiovascular;  Laterality: N/A;   CYSTOSCOPY WITH RETROGRADE PYELOGRAM, URETEROSCOPY AND STENT PLACEMENT Bilateral 01/03/2015   Procedure: CYSTOSCOPY WITH RETROGRADE PYELOGRAM, RIGHT URETEROSCOPY AND RIGHT STENT PLACEMENT;  Surgeon: Ricardo Likens, MD;  Location: [facility];  Service: Urology;  Laterality: Bilateral;   CYSTOSCOPY WITH RETROGRADE PYELOGRAM, URETEROSCOPY AND STENT PLACEMENT Right 03/12/2016   Procedure: CYSTOSCOPY WITH RETROGRADE PYELOGRAM, URETEROSCOPY AND STENT PLACEMENT;  Surgeon: Ricardo Likens, MD;  Location: [facility];  Service: Urology;  Laterality: Right;   ENDOVENOUS ABLATION SAPHENOUS VEIN W/ LASER Left 04/20/2018   endovenous laser ablation left greater saphenous vein by Lynwood Collum MD     HOLMIUM LASER APPLICATION Right 01/03/2015   Procedure: HOLMIUM LASER APPLICATION;  Surgeon: Ricardo Likens, MD;  Location: [facility];  Service: Urology;  Laterality: Right;   HOLMIUM LASER APPLICATION Right 03/12/2016   Procedure: HOLMIUM LASER APPLICATION;  Surgeon: Ricardo Likens, MD;  Location: [facility];  Service: Urology;  Laterality: Right;   IVC filter   07/2014    TONSILLECTOMY     tubes removd from ears     Patient Active Problem List   Diagnosis Date Noted   Chondromalacia patellae, right knee 12/14/2023   Leukocytopenia 08/05/2021   Type 2 diabetes mellitus without complication, with long-term current use of insulin  (HCC)    Bilateral pulmonary embolism (HCC) 05/20/2021   Coronary artery disease involving native coronary artery of native heart without angina pectoris 09/03/2020   Mixed hyperlipidemia 09/03/2020   Essential hypertension 09/03/2020   Chronic deep vein thrombosis (DVT) of popliteal vein of left lower extremity (HCC) 04/06/2018   Varicose veins of left lower extremity with complications 04/06/2018   Atypical chest pain 08/09/2015   Thrombocytopenia (HCC) 02/26/2014   Hyperkalemia 02/25/2014   Acute respiratory failure with hypoxia (HCC) 02/25/2014   Morbid obesity (HCC) 03/20/2012   DM (diabetes mellitus), type 2 with renal complications (HCC) 03/20/2012   Venous stasis 03/20/2012    PCP: [name], NP   REFERRING PROVIDER: Barbarann Anes  C, MD  REFERRING DIAG: M25.561,G89.29 (ICD-10-CM) - Chronic pain of right knee  THERAPY DIAG:  Chronic pain of right knee  Muscle weakness (generalized)  Rationale for Evaluation and Treatment: Rehabilitation  ONSET DATE: 10/13/2023  SUBJECTIVE:   SUBJECTIVE STATEMENT: Arrives with minimal c/o R knee pain  PERTINENT HISTORY: HPI 58 year old male with diabetes renal complications coronary artery disease hypertension history of pulmonary embolisms still on Coumadin  is seen with problems with knee pain going up stairs.   Knee feels weak he was having more pain in the left now having pain in his right.  He is ambulatory with a cane.  He has noted crepitus with knee extension great difficulty if he tries to squat. PAIN:  Are you having pain? Yes: NPRS scale: 15/10 Pain location: B knees Pain description: ache, sore, sharp Aggravating factors: stairs, hills Relieving factors: rest  PRECAUTIONS: None  RED FLAGS: None   WEIGHT BEARING RESTRICTIONS: No  FALLS:  Has patient fallen in last 6 months? No  OCCUPATION: not working  PLOF: Independent  PATIENT GOALS: To manage my knee pain  NEXT MD VISIT: 03/01/24  OBJECTIVE:  Note: Objective measures were completed at Evaluation unless otherwise noted.  DIAGNOSTIC FINDINGS: X-rays 10/13/2023 showed medial joint space narrowing bilaterally. Some calcification of the distal tuberosity suggesting mild Osgood-Schlatter's in the past. Irregularity of the patellofemoral joint.  PATIENT SURVEYS:  LEFS 52/80  MUSCLE LENGTH: Hamstrings: Right 80 deg; Left 80 deg  POSTURE: ER at hips  PALPATION: unremarkable  LOWER EXTREMITY ROM: WNL in knees  Active ROM Right eval Left eval  Hip flexion    Hip extension    Hip abduction    Hip adduction    Hip internal rotation    Hip external rotation    Knee flexion    Knee extension    Ankle dorsiflexion    Ankle plantarflexion    Ankle inversion    Ankle eversion     (Blank rows = not tested)  LOWER EXTREMITY MMT:  MMT Right eval Left eval  Hip flexion    Hip extension    Hip abduction    Hip adduction    Hip internal rotation    Hip external rotation    Knee flexion 4   Knee extension 4   Ankle dorsiflexion    Ankle plantarflexion    Ankle inversion    Ankle eversion     (Blank rows = not tested)  LOWER EXTREMITY SPECIAL TESTS:  Knee special tests: Patellafemoral apprehension test: negative, Lateral pull sign: positive , and Patellafemoral grind test: negative  FUNCTIONAL TESTS:  30  seconds chair stand test 4  GAIT: Distance walked: 72ft x2 Assistive device utilized: Single point cane Level of assistance: Complete Independence Comments: slow cadence, ER at hips  TREATMENT DATE:  [facility] Adult PT Treatment:                                                DATE: 03/18/24 Therapeutic Exercise: Nustep L5 8 min Seated hamstring stretch 30s x2 B Neuromuscular re-ed: SAQs R 2x15 6# Supine hip fallouts BlaTB 10x B, 10/10 unilaterally Bridge against BlaTB 10x  Bridge with ball squeeze 15x2 S/L clams BlaTB 10x  B Therapeutic Activity: Standing heel raises against wall 15x Seated hamstring stretch 30s x2 B FAQ with ball squeeze 15x Seated R hamstring curls BluTB  15x2  [facility] Adult PT Treatment:                                                DATE: 03/15/24 Therapeutic Exercise: Nustep L5 8 min Neuromuscular re-ed: SAQs R 2x15 5# Supine hip fallouts BluTB 15x B, 15/15 unilaterally Bridge against BluTB 15x Bridge with ball squeeze 15x S/L clams BluTB 15x B Therapeutic Activity: Standing heel raises 10x from 4 in block Seated hamstring stretch 30s x2 B FAQ with ball squeeze 15x Seated R hamstring curls GTB  15x2  [facility] Adult PT Treatment:                                                DATE: 03/11/24 Therapeutic Exercise: Nustep L4 8 min Neuromuscular re-ed: SAQs R 2x15 4# Supine hip fallouts BluTB 10x B, 10/10 unilaterally Bridge against BluTB 10x Bridge with ball squeeze 15x S/L clams BluTB 10x B Therapeutic Activity: Standing heel raises 15x against wall Seated hamstring stretch 30s x2 B FAQ with ball squeeze 15x Seated R hamstring curls RTB  15x2 STS arms crossed 10x  [facility] Adult PT Treatment:                                                DATE: 03/07/24 Therapeutic Exercise: Nustep L3 8 min Neuromuscular re-ed: SAQs R 2x15 3# Supine  hip fallouts GTB 15x B, 15/15 unilaterally Bridge against GTB 15x Bridge with ball squeeze 15x S/L clams GTB 15x B Therapeutic Activity: Standing heel raises 15x supported Seated hamstring stretch 30s x2 B FAQ with ball squeeze 15x  [facility] Adult PT Treatment:                                                DATE: 02/24/24 Therapeutic Exercise: Nustep L2 8 min Neuromuscular re-ed: SAQs R 2x15 Supine hip fallouts RTB 15x B, 15/15 unilaterally Bridge against RTB 15x Bridge with ball squeeze 15x S/L clams RTB 15x B  Therapeutic Activity: Standing heel raises 15 from 4 in step Seated hamstring stretch 30s x2 B Runners step from 4 in step 10x B Seated R hamstring curls RTB  [facility] Adult PT Treatment:  DATE: 02/22/24 Therapeutic Exercise: Nustep L2 8 min Therapeutic Activity: FAQs 15x B Standing heel raise 15x Standing hamstring curls 15x B SAQs 15x B Bridge 15x SLR 15x B Seated hamstring stertch 30s x2 B   [facility] Adult PT Treatment:                                                DATE: 02/17/24 Eval and HEP Self Care: Additional minutes spent for educating on updated Therapeutic Home Exercise Program as well as comparing current status to condition at start of symptoms. This included exercises focusing on stretching, strengthening, with focus on eccentric aspects. Long term goals include an improvement in range of motion, strength, endurance as well as avoiding reinjury. Patient's frequency would include in 1-2 times a day, 3-5 times a week for a duration of 6-12 weeks. Proper technique shown and discussed handout in great detail. All questions were discussed and addressed.       PATIENT EDUCATION:  Education details: Discussed eval findings, rehab rationale and POC and patient is in agreement  Person educated: Patient Education method: Explanation Education comprehension: verbalized understanding and needs further education  HOME EXERCISE  PROGRAM: Access Code: [email or web address] URL: [email or web address] Date: 02/17/2024 Prepared by: Reyes Kohut  Exercises - Supine Quad Set  - 1-2 x daily - 5 x weekly - 1-2 sets - 10 reps - 3s hold - Small Range Straight Leg Raise  - 1-2 x daily - 5 x weekly - 1-2 sets - 10 reps - 3s hold - Supine Knee Extension Strengthening  - 1-2 x daily - 5 x weekly - 1-2 sets - 10 reps - 3s hold - Clamshell  - 1-2 x daily - 5 x weekly - 1-2 sets - 10 reps - 3s hold  ASSESSMENT:  CLINICAL IMPRESSION: R knee symptoms remain minimal.  Continued to increase weight, resistance and difficulty to challenge patient/knee.  Continues to handle more difficult workloads w/o exacerbation of symptoms.   Patient is a 58 y.o. male who was seen today for physical therapy evaluation and treatment for chronic R knee pain due to underlying degenerative changes in PF joint. Patient presents with full ROM in R knee, good quad contraction, no extensor lag with SLR, good patellar mobility.  30s chair stand test finds LE weakness especially in hip abduction.  OBJECTIVE IMPAIRMENTS: decreased activity tolerance, decreased endurance, decreased mobility, difficulty walking, decreased ROM, decreased strength, impaired perceived functional ability, impaired flexibility, obesity, and pain.   PERSONAL FACTORS: Age, Fitness, and Past/current experiences are also affecting patient's functional outcome.   REHAB POTENTIAL: Good  CLINICAL DECISION MAKING: Stable/uncomplicated  EVALUATION COMPLEXITY: Low   GOALS: Goals reviewed with patient? No   LONG TERM GOALS: Target date: 03/30/2024    Patient will score at least 64/80 on LEFS to signify clinically meaningful improvement in functional abilities.   Baseline: 52/80 Goal status: INITIAL  2.  Patient will acknowledge 6/10 pain at least once during episode of care   Baseline: 15/10 Goal status: INITIAL  3.  Patient will increase 30s chair stand reps from 4 to 7  with/without arms to demonstrate and improved functional ability with less pain/difficulty as well as reduce fall risk.  Baseline: 4 Goal status: INITIAL  4.  Patient to demonstrate independence in HEP Baseline: [id number] Goal status: INITIAL  5.  Increase R knee strength to 4+/5  Baseline: 4/5 Goal status: INITIAL     PLAN:  PT FREQUENCY: 2x/week  PT DURATION: 6 weeks  PLANNED INTERVENTIONS: 97164- PT Re-evaluation, 97110-Therapeutic exercises, 97530- Therapeutic activity, 97112- Neuromuscular re-education, 97535- Self Care, 02859- Manual therapy, [phone]- Gait training, Patient/Family education, Balance training, Stair training, Taping, Dry Needling, and Joint mobilization  PLAN FOR NEXT SESSION: HEP review and update, manual techniques as appropriate, aerobic tasks, ROM and flexibility activities, strengthening and PREs, TPDN, gait and balance training as needed    Date of referral: 12/08/2023 Referring provider: Barbarann Oneil BROCKS, MD Referring diagnosis? M25.561,G89.29 (ICD-10-CM) - Chronic pain of right knee Treatment diagnosis? (if different than referring diagnosis) M25.561,G89.29 (ICD-10-CM) - Chronic pain of right knee  What was this (referring dx) caused by? Arthritis  Nature of Condition: Chronic (continuous duration > 3 months)   Laterality: Rt  Current Functional Measure Score: LEFS 52/80  Objective measurements identify impairments when they are compared to normal values, the uninvolved extremity, and prior level of function.  [x]  Yes  []  No  Objective assessment of functional ability: Minimal functional limitations   Briefly describe symptoms: retropatellar pain with stairs and prolonged walking  How did symptoms start: degenerative  Average pain intensity:  Last 24 hours: 2/10  Past week: 15/10  How often does the pt experience symptoms? Occasionally  How much have the symptoms interfered with usual daily activities? A little bit  How has condition  changed since care began at this facility? NA - initial visit  In general, how is the patients overall health? Fair   BACK PAIN (STarT Back Screening Tool) No   Reyes CHRISTELLA Kohut, PT 03/18/2024, 10:40 AM  "

## 2024-03-18 ENCOUNTER — Ambulatory Visit

## 2024-03-18 DIAGNOSIS — G8929 Other chronic pain: Secondary | ICD-10-CM

## 2024-03-18 DIAGNOSIS — M25561 Pain in right knee: Secondary | ICD-10-CM | POA: Diagnosis not present

## 2024-03-18 DIAGNOSIS — M6281 Muscle weakness (generalized): Secondary | ICD-10-CM

## 2024-04-06 ENCOUNTER — Ambulatory Visit (INDEPENDENT_AMBULATORY_CARE_PROVIDER_SITE_OTHER): Admitting: Orthopedic Surgery

## 2024-04-06 ENCOUNTER — Encounter: Payer: Self-pay | Admitting: Orthopedic Surgery

## 2024-04-06 DIAGNOSIS — M25561 Pain in right knee: Secondary | ICD-10-CM | POA: Diagnosis not present

## 2024-04-06 NOTE — Progress Notes (Signed)
 Office Visit Note   Patient: Travis Rollins           Date of Birth: 1966-09-13           MRN: [id number] Visit Date: 04/06/2024 Requested by: Maryellen Snare, NP [address] [place],  [place] [address] PCP: Maryellen Snare, NP  Subjective: Chief Complaint  Patient presents with   Right Knee - Pain    HPI: Travis Rollins is a 58 y.o. male who presents to the office reporting continued right knee pain.  Patient has been in physical therapy for longer than 6 weeks working on quad strengthening.  He does describe global pain with mechanical symptoms in the knee.  Therapy is not helping.  Patient has a history of bilateral pulmonary embolism and he is on warfarin..                ROS: All systems reviewed are negative as they relate to the chief complaint within the history of present illness.  Patient denies fevers or chills.  Assessment & Plan: Visit Diagnoses:  1. Right knee pain, unspecified chronicity     Plan: Impression is continued right knee pain despite physical therapy and nonoperative intervention.  Meniscal pathology possible.  Right knee MRI pending.  He really has no significant joint space narrowing in the medial or lateral compartment.  Operative pathology may be present but it would be a complex medical decision for surgery based on his medical comorbidities.  Follow-Up Instructions: No follow-ups on file.   Orders:  Orders Placed This Encounter  Procedures   MR Knee Right w/o contrast   No orders of the defined types were placed in this encounter.     Procedures: No procedures performed   Clinical Data: No additional findings.  Objective: Vital Signs: There were no vitals taken for this visit.  Physical Exam:  Constitutional: Patient appears well-developed HEENT:  Head: Normocephalic Eyes:EOM are normal Neck: Normal range of motion Cardiovascular: Normal rate Pulmonary/chest: Effort normal Neurologic: Patient is alert Skin: Skin is  warm Psychiatric: Patient has normal mood and affect  Ortho Exam: Ortho exam demonstrates full range of motion of the right knee with no effusion.  Does have both medial and lateral joint line tenderness.  Collateral cruciate ligaments are stable.  No groin pain with internal/external Tatian of the leg.  He is wearing compression hose bilaterally.  Both feet are perfused and sensate.  Specialty Comments:  No specialty comments available.  Imaging: No results found.   PMFS History: Patient Active Problem List   Diagnosis Date Noted   Chondromalacia patellae, right knee 12/14/2023   Leukocytopenia 08/05/2021   Type 2 diabetes mellitus without complication, with long-term current use of insulin  (HCC)    Bilateral pulmonary embolism (HCC) 05/20/2021   Coronary artery disease involving native coronary artery of native heart without angina pectoris 09/03/2020   Mixed hyperlipidemia 09/03/2020   Essential hypertension 09/03/2020   Chronic deep vein thrombosis (DVT) of popliteal vein of left lower extremity (HCC) 04/06/2018   Varicose veins of left lower extremity with complications 04/06/2018   Atypical chest pain 08/09/2015   Thrombocytopenia (HCC) 02/26/2014   Hyperkalemia 02/25/2014   Acute respiratory failure with hypoxia (HCC) 02/25/2014   Morbid obesity (HCC) 03/20/2012   DM (diabetes mellitus), type 2 with renal complications (HCC) 03/20/2012   Venous stasis 03/20/2012   Past Medical History:  Diagnosis Date   Anemia    Carpal tunnel syndrome    Deep vein thrombosis (HCC)  07/2013, 10/2014, 03/16/2015   LLE (studies at [facility])   Depression    Diabetes mellitus    Hyperlipidemia    Hypertension    Obesity    Osteoarthritis    Peripheral vascular disease (HCC)    6 yrs ago, DVT in Lt knee/ groin   Pulmonary embolism (HCC)    hx of x 3 per patient; most recent 02/2014   Sinusitis    Sleep apnea     Family History  Problem Relation Age of Onset   Kidney disease Father     Diabetes Father    Colon cancer Neg Hx    Esophageal cancer Neg Hx    Rectal cancer Neg Hx    Stomach cancer Neg Hx     Past Surgical History:  Procedure Laterality Date   CARDIAC CATHETERIZATION N/A 08/10/2015   Procedure: Left Heart Cath and Coronary Angiography;  Surgeon: Knox Perl, MD;  Location: [facility] INVASIVE CV LAB;  Service: Cardiovascular;  Laterality: N/A;   CYSTOSCOPY WITH RETROGRADE PYELOGRAM, URETEROSCOPY AND STENT PLACEMENT Bilateral 01/03/2015   Procedure: CYSTOSCOPY WITH RETROGRADE PYELOGRAM, RIGHT URETEROSCOPY AND RIGHT STENT PLACEMENT;  Surgeon: Osborn Blaze, MD;  Location: [facility];  Service: Urology;  Laterality: Bilateral;   CYSTOSCOPY WITH RETROGRADE PYELOGRAM, URETEROSCOPY AND STENT PLACEMENT Right 03/12/2016   Procedure: CYSTOSCOPY WITH RETROGRADE PYELOGRAM, URETEROSCOPY AND STENT PLACEMENT;  Surgeon: Osborn Blaze, MD;  Location: [facility];  Service: Urology;  Laterality: Right;   ENDOVENOUS ABLATION SAPHENOUS VEIN W/ LASER Left 04/20/2018   endovenous laser ablation left greater saphenous vein by Merced Stair MD    HOLMIUM LASER APPLICATION Right 01/03/2015   Procedure: HOLMIUM LASER APPLICATION;  Surgeon: Osborn Blaze, MD;  Location: [facility];  Service: Urology;  Laterality: Right;   HOLMIUM LASER APPLICATION Right 03/12/2016   Procedure: HOLMIUM LASER APPLICATION;  Surgeon: Osborn Blaze, MD;  Location: [facility];  Service: Urology;  Laterality: Right;   IVC filter   07/2014    TONSILLECTOMY     tubes removd from ears     Social History   Occupational History   Not on file  Tobacco Use   Smoking status: Never   Smokeless tobacco: Never  Vaping Use   Vaping status: Never Used  Substance and Sexual Activity   Alcohol use: Never   Drug use: No   Sexual activity: Yes    Birth control/protection: Condom

## 2024-04-18 ENCOUNTER — Ambulatory Visit
Admission: RE | Admit: 2024-04-18 | Discharge: 2024-04-18 | Disposition: A | Discharge status: Home / Self Care | Source: Ambulatory Visit | Attending: Orthopedic Surgery | Admitting: Orthopedic Surgery

## 2024-04-18 DIAGNOSIS — M25561 Pain in right knee: Secondary | ICD-10-CM

## 2024-04-27 ENCOUNTER — Ambulatory Visit (INDEPENDENT_AMBULATORY_CARE_PROVIDER_SITE_OTHER): Admitting: Orthopedic Surgery

## 2024-04-27 ENCOUNTER — Encounter: Payer: Self-pay | Admitting: Orthopedic Surgery

## 2024-04-27 DIAGNOSIS — M25561 Pain in right knee: Secondary | ICD-10-CM | POA: Diagnosis not present

## 2024-04-27 NOTE — Progress Notes (Signed)
 Office Visit Note   Patient: [name]           Date of Birth: Jun 30, 1966           MRN: [id number] Visit Date: 04/27/2024 Requested by: Maryellen Snare, NP [address] [place],  [place] [address] PCP: Maryellen Snare, NP  Subjective: Chief Complaint  Patient presents with   Right Knee - Follow-up    Review MRI    HPI: Travis Rollins is a 58 y.o. male who presents to the office reporting right knee pain.  Since he was last seen has had an MRI scan of the right knee.  That scan is reviewed with the patient today.  Scan shows minimal degenerative changes and no ligamentous or meniscal injury.  He is exercising daily.  Physical therapy really helped him..                ROS: All systems reviewed are negative as they relate to the chief complaint within the history of present illness.  Patient denies fevers or chills.  Assessment & Plan: Visit Diagnoses:  1. Right knee pain, unspecified chronicity     Plan: Impression is right knee pain unclear etiology which resolved with physical therapy.  Surgical intervention not indicated.  Continue with quad strengthening exercises and follow-up as needed.  Follow-Up Instructions: No follow-ups on file.   Orders:  No orders of the defined types were placed in this encounter.  No orders of the defined types were placed in this encounter.     Procedures: No procedures performed   Clinical Data: No additional findings.  Objective: Vital Signs: There were no vitals taken for this visit.  Physical Exam:  Constitutional: Patient appears well-developed HEENT:  Head: Normocephalic Eyes:EOM are normal Neck: Normal range of motion Cardiovascular: Normal rate Pulmonary/chest: Effort normal Neurologic: Patient is alert Skin: Skin is warm Psychiatric: Patient has normal mood and affect  Ortho Exam: Ortho exam demonstrates full range of motion with no effusion of the right knee.  Collateral crucial ligaments are stable.   Extensor mechanism intact.  No groin pain with internal/external Tatian of the leg.  No other masses lymphadenopathy or skin changes noted in the right knee region.  Specialty Comments:  No specialty comments available.  Imaging: No results found.   PMFS History: Patient Active Problem List   Diagnosis Date Noted   Chondromalacia patellae, right knee 12/14/2023   Leukocytopenia 08/05/2021   Type 2 diabetes mellitus without complication, with long-term current use of insulin  (HCC)    Bilateral pulmonary embolism (HCC) 05/20/2021   Coronary artery disease involving native coronary artery of native heart without angina pectoris 09/03/2020   Mixed hyperlipidemia 09/03/2020   Essential hypertension 09/03/2020   Chronic deep vein thrombosis (DVT) of popliteal vein of left lower extremity (HCC) 04/06/2018   Varicose veins of left lower extremity with complications 04/06/2018   Atypical chest pain 08/09/2015   Thrombocytopenia (HCC) 02/26/2014   Hyperkalemia 02/25/2014   Acute respiratory failure with hypoxia (HCC) 02/25/2014   Morbid obesity (HCC) 03/20/2012   DM (diabetes mellitus), type 2 with renal complications (HCC) 03/20/2012   Venous stasis 03/20/2012   Past Medical History:  Diagnosis Date   Anemia    Carpal tunnel syndrome    Deep vein thrombosis (HCC) 07/2013, 10/2014, 03/16/2015   LLE (studies at [facility])   Depression    Diabetes mellitus    Hyperlipidemia    Hypertension    Obesity    Osteoarthritis  Peripheral vascular disease (HCC)    6 yrs ago, DVT in Lt knee/ groin   Pulmonary embolism (HCC)    hx of x 3 per patient; most recent 02/2014   Sinusitis    Sleep apnea     Family History  Problem Relation Age of Onset   Kidney disease Father    Diabetes Father    Colon cancer Neg Hx    Esophageal cancer Neg Hx    Rectal cancer Neg Hx    Stomach cancer Neg Hx     Past Surgical History:  Procedure Laterality Date   CARDIAC CATHETERIZATION N/A 08/10/2015    Procedure: Left Heart Cath and Coronary Angiography;  Surgeon: Knox Perl, MD;  Location: [facility] INVASIVE CV LAB;  Service: Cardiovascular;  Laterality: N/A;   CYSTOSCOPY WITH RETROGRADE PYELOGRAM, URETEROSCOPY AND STENT PLACEMENT Bilateral 01/03/2015   Procedure: CYSTOSCOPY WITH RETROGRADE PYELOGRAM, RIGHT URETEROSCOPY AND RIGHT STENT PLACEMENT;  Surgeon: Osborn Blaze, MD;  Location: [facility] ORS;  Service: Urology;  Laterality: Bilateral;   CYSTOSCOPY WITH RETROGRADE PYELOGRAM, URETEROSCOPY AND STENT PLACEMENT Right 03/12/2016   Procedure: CYSTOSCOPY WITH RETROGRADE PYELOGRAM, URETEROSCOPY AND STENT PLACEMENT;  Surgeon: Osborn Blaze, MD;  Location: [facility];  Service: Urology;  Laterality: Right;   ENDOVENOUS ABLATION SAPHENOUS VEIN W/ LASER Left 04/20/2018   endovenous laser ablation left greater saphenous vein by Merced Stair MD    HOLMIUM LASER APPLICATION Right 01/03/2015   Procedure: HOLMIUM LASER APPLICATION;  Surgeon: Osborn Blaze, MD;  Location: [facility];  Service: Urology;  Laterality: Right;   HOLMIUM LASER APPLICATION Right 03/12/2016   Procedure: HOLMIUM LASER APPLICATION;  Surgeon: Osborn Blaze, MD;  Location: [facility];  Service: Urology;  Laterality: Right;   IVC filter   07/2014    TONSILLECTOMY     tubes removd from ears     Social History   Occupational History   Not on file  Tobacco Use   Smoking status: Never   Smokeless tobacco: Never  Vaping Use   Vaping status: Never Used  Substance and Sexual Activity   Alcohol use: Never   Drug use: No   Sexual activity: Yes    Birth control/protection: Condom

## 2024-05-13 ENCOUNTER — Encounter: Payer: Self-pay | Admitting: Podiatry

## 2024-05-13 ENCOUNTER — Ambulatory Visit: Payer: Self-pay | Admitting: Podiatry

## 2024-05-13 DIAGNOSIS — M79672 Pain in left foot: Secondary | ICD-10-CM | POA: Diagnosis not present

## 2024-05-13 DIAGNOSIS — E1151 Type 2 diabetes mellitus with diabetic peripheral angiopathy without gangrene: Secondary | ICD-10-CM

## 2024-05-13 DIAGNOSIS — I70209 Unspecified atherosclerosis of native arteries of extremities, unspecified extremity: Secondary | ICD-10-CM

## 2024-05-13 DIAGNOSIS — B351 Tinea unguium: Secondary | ICD-10-CM

## 2024-05-13 DIAGNOSIS — M79671 Pain in right foot: Secondary | ICD-10-CM

## 2024-05-13 NOTE — Progress Notes (Signed)
 Patient presents for evaluation and treatment of tenderness and some redness around nails feet.  Tenderness around toes with walking and wearing shoes.  Patient takes Coumadin   Physical exam:  General appearance: Alert, pleasant, and in no acute distress.  Vascular: Pedal pulses: DP 2/4 B/L, PT 0/4 B/L.  Moderate edema lower legs bilaterally  Neurological:  No paresthesias or burning noted  Dermatologic:  Nails thickened, disfigured, discolored 1-5 BL with subungual debris.  Redness and hypertrophic nail folds along nail folds bilaterally but no signs of drainage or infection.  Musculoskeletal:  Toe deformities 2 through 5 bilaterally   Diagnosis: 1. Painful onychomycotic nails 1 through 5 bilaterally. 2. Pain toes 1 through 5 bilaterally. 3.  Diabetes mellitus type 2 with PVD  Plan: Debrided onychomycotic nails 1 through 5 bilaterally.  Return 3 months

## 2024-07-07 ENCOUNTER — Emergency Department (HOSPITAL_COMMUNITY)
Admission: EM | Admit: 2024-07-07 | Discharge: 2024-07-07 | Disposition: A | Discharge status: Home / Self Care | Attending: Emergency Medicine | Admitting: Emergency Medicine

## 2024-07-07 ENCOUNTER — Emergency Department (HOSPITAL_COMMUNITY)

## 2024-07-07 ENCOUNTER — Other Ambulatory Visit: Payer: Self-pay

## 2024-07-07 DIAGNOSIS — Z7901 Long term (current) use of anticoagulants: Secondary | ICD-10-CM | POA: Diagnosis not present

## 2024-07-07 DIAGNOSIS — D696 Thrombocytopenia, unspecified: Secondary | ICD-10-CM | POA: Diagnosis not present

## 2024-07-07 DIAGNOSIS — E878 Other disorders of electrolyte and fluid balance, not elsewhere classified: Secondary | ICD-10-CM | POA: Insufficient documentation

## 2024-07-07 DIAGNOSIS — R0602 Shortness of breath: Secondary | ICD-10-CM | POA: Diagnosis present

## 2024-07-07 DIAGNOSIS — M545 Low back pain, unspecified: Secondary | ICD-10-CM | POA: Diagnosis not present

## 2024-07-07 DIAGNOSIS — E119 Type 2 diabetes mellitus without complications: Secondary | ICD-10-CM | POA: Diagnosis not present

## 2024-07-07 LAB — CBC WITH DIFFERENTIAL/PLATELET
Abs Immature Granulocytes: 0.01 K/uL (ref 0.00–0.07)
Basophils Absolute: 0 K/uL (ref 0.0–0.1)
Basophils Relative: 1 %
Eosinophils Absolute: 0.1 K/uL (ref 0.0–0.5)
Eosinophils Relative: 3 %
HCT: 47.2 % (ref 39.0–52.0)
Hemoglobin: 15 g/dL (ref 13.0–17.0)
Immature Granulocytes: 0 %
Lymphocytes Relative: 28 %
Lymphs Abs: 1.2 K/uL (ref 0.7–4.0)
MCH: 31.3 pg (ref 26.0–34.0)
MCHC: 31.8 g/dL (ref 30.0–36.0)
MCV: 98.3 fL (ref 80.0–100.0)
Monocytes Absolute: 0.5 K/uL (ref 0.1–1.0)
Monocytes Relative: 12 %
Neutro Abs: 2.4 K/uL (ref 1.7–7.7)
Neutrophils Relative %: 56 %
Platelets: 79 K/uL — ABNORMAL LOW (ref 150–400)
RBC: 4.8 MIL/uL (ref 4.22–5.81)
RDW: 15 % (ref 11.5–15.5)
WBC: 4.3 K/uL (ref 4.0–10.5)
nRBC: 0 % (ref 0.0–0.2)

## 2024-07-07 LAB — BASIC METABOLIC PANEL WITH GFR
Anion gap: 11 (ref 5–15)
BUN: 24 mg/dL — ABNORMAL HIGH (ref 6–20)
CO2: 15 mmol/L — ABNORMAL LOW (ref 22–32)
Calcium: 9 mg/dL (ref 8.9–10.3)
Chloride: 112 mmol/L — ABNORMAL HIGH (ref 98–111)
Creatinine, Ser: 1.06 mg/dL (ref 0.61–1.24)
GFR, Estimated: >60 mL/min (ref >60)
Glucose, Bld: 149 mg/dL — ABNORMAL HIGH (ref 70–99)
Potassium: 4.7 mmol/L (ref 3.5–5.1)
Sodium: 138 mmol/L (ref 135–145)

## 2024-07-07 LAB — PROTIME-INR
INR: 1.2 (ref 0.8–1.2)
Prothrombin Time: 15.5 s — ABNORMAL HIGH (ref 11.4–15.2)

## 2024-07-07 LAB — TROPONIN I (HIGH SENSITIVITY)
Troponin I (High Sensitivity): 8 ng/L (ref <18)
Troponin I (High Sensitivity): 10 ng/L (ref <18)

## 2024-07-07 LAB — BRAIN NATRIURETIC PEPTIDE: B Natriuretic Peptide: 24.3 pg/mL (ref 0.0–100.0)

## 2024-07-07 MED ORDER — IOHEXOL 350 MG/ML SOLN
75.0000 mL | Freq: Once | INTRAVENOUS | Status: AC | PRN
  Administered 2024-07-07: 75 mL via INTRAVENOUS

## 2024-07-07 NOTE — ED Provider Notes (Signed)
 [facility] Provider Note   CSN: [id number] Arrival date & time: 07/07/24  9051     Patient presents with: Shortness of Breath and Back Pain   Travis Rollins is a 58 y.o. male patient with history of diabetes, pulmonary embolism back in 2021, chronic lower DVT who presents the emerged from today for further evaluation of shortness of breath.  Patient states that he was extremely short of breath after walking to his mailbox this morning at approximately 8:20 AM.  He states that he was so short of breath that he almost passed out.  Shortness of breath did improve with rest.  He also also complaining of associated lower back pain which he states is very similar to when he had the pulmonary embolisms in the past.  He has been taking his Coumadin  and has not missed any doses but he does not regularly get his INR checked.  It is unclear whether or not he got his INR checked last.  Upon chart review, the last INR that I have is approximately 3 years ago.  Patient denies any chest pain or shortness of breath currently.  Denies any weight gain, cough, congestion, fever, chills.    Shortness of Breath Back Pain      Prior to Admission medications   Medication Sig Start Date End Date Taking? Authorizing Provider  allopurinol (ZYLOPRIM) 100 MG tablet Take 100 mg by mouth daily.    [provider]  atorvastatin (LIPITOR) 40 MG tablet Take 40 mg by mouth at bedtime.     [provider]  Capsicum, Cayenne, 455 MG CAPS Take 2 tablets by mouth in the morning and at bedtime.    [provider]  ezetimibe (ZETIA) 10 MG tablet Take 10 mg by mouth daily.    [provider]  furosemide  (LASIX ) 40 MG tablet Take 40 mg by mouth every morning.     [provider]  Garlic 1000 MG CAPS Take 1 capsule by mouth in the morning and at bedtime.    [provider]  lisinopril  (PRINIVIL ,ZESTRIL ) 2.5 MG tablet Take 2.5  mg by mouth daily.    [provider]  Magnesium 200 MG CHEW Chew 2 capsules by mouth in the morning and at bedtime.    [provider]  metoprolol  succinate (TOPROL -XL) 50 MG 24 hr tablet Take 50 mg by mouth at bedtime. Take with or immediately following a meal.    [provider]  polycarbophil (FIBERCON) 625 MG tablet Take 625 mg by mouth in the morning, at noon, and at bedtime.    [provider]  potassium chloride  (K-DUR) 10 MEQ tablet Take 10 mEq by mouth at bedtime.     [provider]  SYNJARDY XR 12.04-999 MG TB24 Take 1 tablet by mouth 2 (two) times daily. 07/13/23   [provider]  tamsulosin  (FLOMAX ) 0.4 MG CAPS capsule Take 0.4 mg by mouth daily after breakfast.    [provider]  TRESIBA FLEXTOUCH 200 UNIT/ML SOPN Inject 26 Units into the skin at bedtime. 08/25/18   [provider]  triamcinolone cream (KENALOG) 0.1 % SMARTSIG:Sparingly Topical Twice Daily PRN 08/26/21   [provider]  TRULICITY 3 MG/0.5ML SOPN As directed 04/13/23   [provider]  Turmeric (QC TUMERIC COMPLEX) 500 MG CAPS Take 500 mg by mouth daily.    [provider]  Ubiquinol 100 MG CAPS Take 1 capsule by mouth daily.  [provider]  warfarin (COUMADIN ) 4 MG tablet Take 8 mg by mouth daily.    [provider]    Allergies: Penicillins    Review of Systems  Respiratory:  Positive for shortness of breath.   Musculoskeletal:  Positive for back pain.  All other systems reviewed and are negative.   Updated Vital Signs BP (!) 156/99 (BP Location: Right Arm)   Pulse 94   Temp 98 F (36.7 C) (Oral)   Resp 18   Ht 5' 6 (1.676 m)   Wt 113.9 kg   SpO2 97%   BMI 40.51 kg/m   Physical Exam Vitals and nursing note reviewed.  Constitutional:      General: He is not in acute distress.    Appearance: Normal appearance.  HENT:     Head: Normocephalic and atraumatic.  Eyes:     General:         Right eye: No discharge.        Left eye: No discharge.  Cardiovascular:     Comments: Regular rate and rhythm.  S1/S2 are distinct without any evidence of murmur, rubs, or gallops.  Radial pulses are 2+ bilaterally.  Dorsalis pedis pulses are 2+ bilaterally.  No evidence of pedal edema. Pulmonary:     Comments: Clear to auscultation bilaterally.  Normal effort.  No respiratory distress.  No evidence of wheezes, rales, or rhonchi heard throughout. Abdominal:     General: Abdomen is flat. Bowel sounds are normal. There is no distension.     Tenderness: There is no abdominal tenderness. There is no guarding or rebound.  Musculoskeletal:        General: Normal range of motion.     Cervical back: Neck supple.  Skin:    General: Skin is warm and dry.     Findings: No rash.  Neurological:     General: No focal deficit present.     Mental Status: He is alert.  Psychiatric:        Mood and Affect: Mood normal.        Behavior: Behavior normal.     (all labs ordered are listed, but only abnormal results are displayed) Labs Reviewed  CBC WITH DIFFERENTIAL/PLATELET - Abnormal; Notable for the following components:      Result Value   Platelets 79 (*)    All other components within normal limits  BASIC METABOLIC PANEL WITH GFR - Abnormal; Notable for the following components:   Chloride 112 (*)    CO2 15 (*)    Glucose, Bld 149 (*)    BUN 24 (*)    All other components within normal limits  PROTIME-INR - Abnormal; Notable for the following components:   Prothrombin Time 15.5 (*)    All other components within normal limits  BRAIN NATRIURETIC PEPTIDE  TROPONIN I (HIGH SENSITIVITY)  TROPONIN I (HIGH SENSITIVITY)    EKG: EKG Interpretation Date/Time:  Thursday July 07 2024 09:49:38 EDT Ventricular Rate:  106 PR Interval:  128 QRS Duration:  84 QT Interval:  336 QTC Calculation: 447 R Axis:   8  Text Interpretation: Sinus tachycardia Abnormal R-wave progression, early  transition Borderline T wave abnormalities SINCE LAST TRACING HEART RATE HAS INCREASED Confirmed by Lenor Hollering [phone]) on 07/07/2024 9:53:21 AM  Radiology: CT Angio Chest Pulmonary Embolism (PE) W or WO Contrast Result Date: 07/07/2024 CLINICAL DATA:  shortness of breath with hx of PE EXAM: CT ANGIOGRAPHY CHEST WITH CONTRAST TECHNIQUE: Multidetector CT imaging of the chest  was performed using the standard protocol during bolus administration of intravenous contrast. Multiplanar CT image reconstructions and MIPs were obtained to evaluate the vascular anatomy. RADIATION DOSE REDUCTION: This exam was performed according to the departmental dose-optimization program which includes automated exposure control, adjustment of the mA and/or kV according to patient size and/or use of iterative reconstruction technique. CONTRAST:  75mL OMNIPAQUE  IOHEXOL  350 MG/ML SOLN COMPARISON:  May 20, 2023, May 24, 2021 FINDINGS: Pulmonary Embolism: No acute pulmonary embolism. Web-like filling defect within the proximal segmental right middle lobe branch (axial 77) and within the left lower lobe pulmonary artery (axial 72), consistent with chronic pulmonary emboli changes. Cardiovascular: No cardiomegaly or pericardial effusion.Densely calcified LAD atherosclerosis.No aortic aneurysm. Scattered calcified aortic atherosclerosis. Mediastinum/Nodes: No mediastinal mass. No mediastinal, hilar, or axillary lymphadenopathy. Lungs/Pleura: The midline trachea and bronchi are patent. Posterior bibasilar dependent atelectasis. No focal airspace consolidation, pleural effusion, or pneumothorax. Mild pleuroparenchymal scarring in both lung apices. Minimal subpleural reticulation within the anterior aspects of both upper lobes, possibly postinfectious fibrosis in scarring. Musculoskeletal: No acute fracture or destructive bone lesion. Multilevel degenerative disc disease of the spine. Upper Abdomen: No acute abnormality in the partially  visualized upper abdomen. Geographic hepatic steatosis. Review of the MIP images confirms the above findings. IMPRESSION: 1. No acute intrathoracic abnormality; specifically, no pulmonary embolism, pneumonia, or pleural effusion. 2. 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 3. Extensive, dense calcified atherosclerosis throughout the LAD. Aortic Atherosclerosis (ICD10-I70.0). Electronically Signed   By: Rogelia Myers M.D.   On: 07/07/2024 13:18    Procedures   Medications Ordered in the ED  iohexol  (OMNIPAQUE ) 350 MG/ML injection 75 mL (75 mLs Intravenous Contrast Given 07/07/24 1255)    Clinical Course as of 07/07/24 1531  Thu Jul 07, 2024  1219 Protime-INR(!) Prothrombin is prolonged.  INR is normal.  Patient is now subtherapeutic in the setting of Coumadin . [CF]  1220 Troponin I (High Sensitivity) Initial troponin is normal. [CF]  1220 Brain natriuretic peptide BNP is normal. [CF]  1220 CBC with Differential(!) Apart from thrombocytopenia labs are normal. [CF]  1221 Basic metabolic panel(!) Mild hyperchloremia, elevated glucose in the setting of a nonfasting sample, and BUN.  Creatinine appears to be normal. [CF]  1523 Troponin I (High Sensitivity) Delta troponin is normal. [CF]  1523 CT Angio Chest Pulmonary Embolism (PE) W or WO Contrast I personally ordered interpreted the study.  This was negative for any pulmonary embolism.  I do agree with radiologist interpretation. [CF]  1523 I went over all labs and imaging with him at the bedside.  All questions and concerns addressed. [CF]    Clinical Course User Index [CF] Theotis [facility] HERO, PA-C    Medical Decision Making DARRIEL UTTER is a 58 y.o. male patient who presents to the emergency department today for further evaluation of shortness of breath.  Given that the patient does have a history of previous clotting, I am concerned for pulmonary embolism.  Patient is overall high risk we will proceed  with a CT pulmonary embolism study.  Will also get troponin, BNP, basic labs, EKG to look for any other causes like ACS, pneumonia, heart failure.  Patient is overall asymptomatic at this time.  Will plan to ambulate him with a pulse ox as well.  Negative for pulmonary embolism.  Low suspicion for ACS.  Plan to discharge home.  Patient ambulated in the emergency department today and was at 100% on room air and walked  approximately 75 feet.  He had mild elevation in his heart rate.  No other symptoms.  Strict turn precaution were discussed.  I will have him follow-up with his primary care doctor for further evaluation.  He is safe for discharge.  Amount and/or Complexity of Data Reviewed Labs: ordered. Decision-making details documented in ED Course. Radiology: ordered. Decision-making details documented in ED Course.  Risk Prescription drug management.     Final diagnoses:  Shortness of breath    ED Discharge Orders     None          Theotis Peers [place], [place] 07/07/24 1531    Lenor Hollering, MD 07/08/24 [phone]

## 2024-07-07 NOTE — ED Triage Notes (Signed)
 SOB and lower back pain which started suddenly at 0820 - he states he felt like at the moment it came on he was lightheaded and felt like he might pass out - concerned of PE which he has a hx of - The pt takes warfarin

## 2024-07-07 NOTE — ED Notes (Signed)
 Difficulty obtaining second troponin. Phlebotomy asked to see patient for lab.

## 2024-07-07 NOTE — ED Notes (Signed)
 An ambulation trial was performed at this time. The patient ambulated ~38ft with complaints of dyspnea on exertion, he did present with some tachypnea during ambulation. O2 sats maintained 98-100% on RA. HR increased from 95 to 125 and has since returned to baseline since returning to bed. No complaints of dizziness or lightheadedness.

## 2024-07-07 NOTE — ED Notes (Signed)
 EMS REPORT  [facility]  SOB and lower back pain which started suddenly at 0820 - he states he felt like at the moment it came on he was lightheaded  and felt like he might pass out - concerned of PE which he has a hx of - The pt takes warfarin   O2 100 Clear lungs BP 130/80 CBG 126 HR 90 RR 18  Pt states at this time he still feels SOB and lower back pain 7/10 - the pt denies feeling lightheaded at this time.

## 2024-07-07 NOTE — Discharge Instructions (Signed)
 As we discussed, your CT scan of the chest was negative for any blood clot in your lungs which is great news.  All of your labs are also reassuring.  Your INR is a little lower from what I would expect while taking Coumadin .  I would follow-up with your primary care doctor for further evaluation and dosing adjustments of your Coumadin .  You may return to the emergency department for any worsening symptoms.

## 2024-07-07 NOTE — ED Notes (Signed)
 ED Provider at bedside.

## 2024-07-12 ENCOUNTER — Emergency Department (HOSPITAL_COMMUNITY)

## 2024-07-12 ENCOUNTER — Inpatient Hospital Stay (HOSPITAL_COMMUNITY)
Admission: EM | Admit: 2024-07-12 | Discharge: 2024-07-21 | DRG: 163 | Disposition: A | Discharge status: Home Health | Attending: Internal Medicine | Admitting: Internal Medicine

## 2024-07-12 ENCOUNTER — Inpatient Hospital Stay (HOSPITAL_COMMUNITY)

## 2024-07-12 DIAGNOSIS — E1151 Type 2 diabetes mellitus with diabetic peripheral angiopathy without gangrene: Secondary | ICD-10-CM | POA: Diagnosis present

## 2024-07-12 DIAGNOSIS — R578 Other shock: Secondary | ICD-10-CM | POA: Diagnosis present

## 2024-07-12 DIAGNOSIS — I2692 Saddle embolus of pulmonary artery without acute cor pulmonale: Secondary | ICD-10-CM | POA: Diagnosis not present

## 2024-07-12 DIAGNOSIS — E1129 Type 2 diabetes mellitus with other diabetic kidney complication: Secondary | ICD-10-CM | POA: Diagnosis present

## 2024-07-12 DIAGNOSIS — Z7901 Long term (current) use of anticoagulants: Secondary | ICD-10-CM

## 2024-07-12 DIAGNOSIS — S0003XA Contusion of scalp, initial encounter: Secondary | ICD-10-CM | POA: Diagnosis present

## 2024-07-12 DIAGNOSIS — R55 Syncope and collapse: Secondary | ICD-10-CM | POA: Diagnosis present

## 2024-07-12 DIAGNOSIS — K760 Fatty (change of) liver, not elsewhere classified: Secondary | ICD-10-CM | POA: Diagnosis present

## 2024-07-12 DIAGNOSIS — Z841 Family history of disorders of kidney and ureter: Secondary | ICD-10-CM

## 2024-07-12 DIAGNOSIS — S300XXA Contusion of lower back and pelvis, initial encounter: Secondary | ICD-10-CM

## 2024-07-12 DIAGNOSIS — I82512 Chronic embolism and thrombosis of left femoral vein: Secondary | ICD-10-CM | POA: Diagnosis present

## 2024-07-12 DIAGNOSIS — Z794 Long term (current) use of insulin: Secondary | ICD-10-CM | POA: Diagnosis not present

## 2024-07-12 DIAGNOSIS — Z86711 Personal history of pulmonary embolism: Secondary | ICD-10-CM

## 2024-07-12 DIAGNOSIS — R7401 Elevation of levels of liver transaminase levels: Secondary | ICD-10-CM

## 2024-07-12 DIAGNOSIS — J9601 Acute respiratory failure with hypoxia: Secondary | ICD-10-CM | POA: Diagnosis not present

## 2024-07-12 DIAGNOSIS — Z95828 Presence of other vascular implants and grafts: Secondary | ICD-10-CM | POA: Diagnosis not present

## 2024-07-12 DIAGNOSIS — E119 Type 2 diabetes mellitus without complications: Secondary | ICD-10-CM

## 2024-07-12 DIAGNOSIS — I2602 Saddle embolus of pulmonary artery with acute cor pulmonale: Principal | ICD-10-CM | POA: Diagnosis present

## 2024-07-12 DIAGNOSIS — N179 Acute kidney failure, unspecified: Secondary | ICD-10-CM

## 2024-07-12 DIAGNOSIS — Y9301 Activity, walking, marching and hiking: Secondary | ICD-10-CM | POA: Diagnosis present

## 2024-07-12 DIAGNOSIS — N400 Enlarged prostate without lower urinary tract symptoms: Secondary | ICD-10-CM | POA: Diagnosis present

## 2024-07-12 DIAGNOSIS — E8721 Acute metabolic acidosis: Secondary | ICD-10-CM | POA: Diagnosis present

## 2024-07-12 DIAGNOSIS — E861 Hypovolemia: Secondary | ICD-10-CM | POA: Diagnosis not present

## 2024-07-12 DIAGNOSIS — W1839XA Other fall on same level, initial encounter: Secondary | ICD-10-CM | POA: Diagnosis present

## 2024-07-12 DIAGNOSIS — E785 Hyperlipidemia, unspecified: Secondary | ICD-10-CM | POA: Diagnosis present

## 2024-07-12 DIAGNOSIS — D6861 Antiphospholipid syndrome: Secondary | ICD-10-CM | POA: Diagnosis present

## 2024-07-12 DIAGNOSIS — E8729 Other acidosis: Secondary | ICD-10-CM | POA: Diagnosis not present

## 2024-07-12 DIAGNOSIS — I2699 Other pulmonary embolism without acute cor pulmonale: Secondary | ICD-10-CM | POA: Diagnosis present

## 2024-07-12 DIAGNOSIS — D62 Acute posthemorrhagic anemia: Secondary | ICD-10-CM

## 2024-07-12 DIAGNOSIS — F32A Depression, unspecified: Secondary | ICD-10-CM | POA: Diagnosis present

## 2024-07-12 DIAGNOSIS — I82433 Acute embolism and thrombosis of popliteal vein, bilateral: Secondary | ICD-10-CM | POA: Diagnosis present

## 2024-07-12 DIAGNOSIS — I724 Aneurysm of artery of lower extremity: Secondary | ICD-10-CM | POA: Diagnosis not present

## 2024-07-12 DIAGNOSIS — D6959 Other secondary thrombocytopenia: Secondary | ICD-10-CM | POA: Diagnosis present

## 2024-07-12 DIAGNOSIS — I82461 Acute embolism and thrombosis of right calf muscular vein: Secondary | ICD-10-CM | POA: Diagnosis present

## 2024-07-12 DIAGNOSIS — R579 Shock, unspecified: Secondary | ICD-10-CM | POA: Diagnosis not present

## 2024-07-12 DIAGNOSIS — D539 Nutritional anemia, unspecified: Secondary | ICD-10-CM | POA: Diagnosis present

## 2024-07-12 DIAGNOSIS — E871 Hypo-osmolality and hyponatremia: Secondary | ICD-10-CM | POA: Diagnosis present

## 2024-07-12 DIAGNOSIS — Z6841 Body Mass Index (BMI) 40.0 and over, adult: Secondary | ICD-10-CM

## 2024-07-12 DIAGNOSIS — Z79899 Other long term (current) drug therapy: Secondary | ICD-10-CM

## 2024-07-12 DIAGNOSIS — Y929 Unspecified place or not applicable: Secondary | ICD-10-CM | POA: Diagnosis not present

## 2024-07-12 DIAGNOSIS — E1165 Type 2 diabetes mellitus with hyperglycemia: Secondary | ICD-10-CM | POA: Diagnosis present

## 2024-07-12 DIAGNOSIS — R0602 Shortness of breath: Secondary | ICD-10-CM | POA: Diagnosis present

## 2024-07-12 DIAGNOSIS — I429 Cardiomyopathy, unspecified: Secondary | ICD-10-CM | POA: Diagnosis present

## 2024-07-12 DIAGNOSIS — Z7985 Long-term (current) use of injectable non-insulin antidiabetic drugs: Secondary | ICD-10-CM

## 2024-07-12 DIAGNOSIS — Z880 Allergy status to penicillin: Secondary | ICD-10-CM

## 2024-07-12 DIAGNOSIS — D7589 Other specified diseases of blood and blood-forming organs: Secondary | ICD-10-CM | POA: Diagnosis present

## 2024-07-12 DIAGNOSIS — I10 Essential (primary) hypertension: Secondary | ICD-10-CM | POA: Diagnosis present

## 2024-07-12 DIAGNOSIS — E875 Hyperkalemia: Secondary | ICD-10-CM | POA: Diagnosis present

## 2024-07-12 DIAGNOSIS — R7983 Abnormal findings of blood amino-acid level: Secondary | ICD-10-CM | POA: Diagnosis present

## 2024-07-12 DIAGNOSIS — Z833 Family history of diabetes mellitus: Secondary | ICD-10-CM

## 2024-07-12 DIAGNOSIS — D696 Thrombocytopenia, unspecified: Secondary | ICD-10-CM | POA: Diagnosis present

## 2024-07-12 DIAGNOSIS — R7989 Other specified abnormal findings of blood chemistry: Secondary | ICD-10-CM | POA: Diagnosis present

## 2024-07-12 HISTORY — PX: IR THROMBECT PRIM MECH ADD (INCLU) MOD SED: IMG2298

## 2024-07-12 HISTORY — PX: IR IVC FILTER RETRIEVAL / S&I /IMG GUID/MOD SED: IMG5308

## 2024-07-12 HISTORY — PX: IR ANGIOGRAM PULMONARY BILATERAL SELECTIVE: IMG664

## 2024-07-12 HISTORY — DX: Other pulmonary embolism without acute cor pulmonale: I26.99

## 2024-07-12 HISTORY — PX: IR VENOCAVAGRAM IVC: IMG678

## 2024-07-12 HISTORY — PX: IR US GUIDE VASC ACCESS RIGHT: IMG2390

## 2024-07-12 HISTORY — PX: IR IVC FILTER PLMT / S&I /IMG GUID/MOD SED: IMG701

## 2024-07-12 HISTORY — PX: IR ANGIOGRAM SELECTIVE EACH ADDITIONAL VESSEL: IMG667

## 2024-07-12 HISTORY — PX: IR THROMBECT PRIM MECH INIT (INCLU) MOD SED: IMG2297

## 2024-07-12 LAB — COMPREHENSIVE METABOLIC PANEL WITH GFR
ALT: 311 U/L — ABNORMAL HIGH (ref 0–44)
AST: 360 U/L — ABNORMAL HIGH (ref 15–41)
Albumin: 3.3 g/dL — ABNORMAL LOW (ref 3.5–5.0)
Alkaline Phosphatase: 85 U/L (ref 38–126)
Anion gap: 13 (ref 5–15)
BUN: 32 mg/dL — ABNORMAL HIGH (ref 6–20)
CO2: 16 mmol/L — ABNORMAL LOW (ref 22–32)
Calcium: 8.8 mg/dL — ABNORMAL LOW (ref 8.9–10.3)
Chloride: 109 mmol/L (ref 98–111)
Creatinine, Ser: 1.76 mg/dL — ABNORMAL HIGH (ref 0.61–1.24)
GFR, Estimated: 45 mL/min — ABNORMAL LOW (ref >60)
Glucose, Bld: 241 mg/dL — ABNORMAL HIGH (ref 70–99)
Potassium: 4.6 mmol/L (ref 3.5–5.1)
Sodium: 138 mmol/L (ref 135–145)
Total Bilirubin: 1 mg/dL (ref 0.0–1.2)
Total Protein: 6.5 g/dL (ref 6.5–8.1)

## 2024-07-12 LAB — I-STAT CHEM 8, ED
BUN: 32 mg/dL — ABNORMAL HIGH (ref 6–20)
Calcium, Ion: 1.17 mmol/L (ref 1.15–1.40)
Chloride: 113 mmol/L — ABNORMAL HIGH (ref 98–111)
Creatinine, Ser: 1.7 mg/dL — ABNORMAL HIGH (ref 0.61–1.24)
Glucose, Bld: 238 mg/dL — ABNORMAL HIGH (ref 70–99)
HCT: 47 % (ref 39.0–52.0)
Hemoglobin: 16 g/dL (ref 13.0–17.0)
Potassium: 4 mmol/L (ref 3.5–5.1)
Sodium: 141 mmol/L (ref 135–145)
TCO2: 17 mmol/L — ABNORMAL LOW (ref 22–32)

## 2024-07-12 LAB — LACTIC ACID, PLASMA: Lactic Acid, Venous: 3.6 mmol/L (ref 0.5–1.9)

## 2024-07-12 LAB — CBC
HCT: 46.5 % (ref 39.0–52.0)
Hemoglobin: 14.4 g/dL (ref 13.0–17.0)
MCH: 31.2 pg (ref 26.0–34.0)
MCHC: 31 g/dL (ref 30.0–36.0)
MCV: 100.9 fL — ABNORMAL HIGH (ref 80.0–100.0)
Platelets: 67 K/uL — ABNORMAL LOW (ref 150–400)
RBC: 4.61 MIL/uL (ref 4.22–5.81)
RDW: 15.4 % (ref 11.5–15.5)
WBC: 5.2 K/uL (ref 4.0–10.5)
nRBC: 0 % (ref 0.0–0.2)

## 2024-07-12 LAB — SAMPLE TO BLOOD BANK

## 2024-07-12 LAB — BRAIN NATRIURETIC PEPTIDE: B Natriuretic Peptide: 12 pg/mL (ref 0.0–100.0)

## 2024-07-12 LAB — ETHANOL: Alcohol, Ethyl (B): <15 mg/dL (ref <15)

## 2024-07-12 LAB — TROPONIN I (HIGH SENSITIVITY): Troponin I (High Sensitivity): 191 ng/L (ref <18)

## 2024-07-12 LAB — I-STAT CG4 LACTIC ACID, ED: Lactic Acid, Venous: 3.5 mmol/L (ref 0.5–1.9)

## 2024-07-12 LAB — PROTIME-INR
INR: 1.3 — ABNORMAL HIGH (ref 0.8–1.2)
Prothrombin Time: 16.6 s — ABNORMAL HIGH (ref 11.4–15.2)

## 2024-07-12 MED ORDER — INSULIN ASPART 100 UNIT/ML IJ SOLN
0.0000 [IU] | INTRAMUSCULAR | Status: DC
  Administered 2024-07-13: 3 [IU] via SUBCUTANEOUS
  Administered 2024-07-13: 2 [IU] via SUBCUTANEOUS

## 2024-07-12 MED ORDER — FENTANYL CITRATE (PF) 100 MCG/2ML IJ SOLN
INTRAMUSCULAR | Status: AC
  Filled 2024-07-12: qty 2

## 2024-07-12 MED ORDER — LIDOCAINE-EPINEPHRINE 1 %-1:100000 IJ SOLN
20.0000 mL | Freq: Once | INTRAMUSCULAR | Status: AC
  Administered 2024-07-12: 10 mL

## 2024-07-12 MED ORDER — HEPARIN SODIUM (PORCINE) 1000 UNIT/ML IJ SOLN
INTRAMUSCULAR | Status: AC
  Filled 2024-07-12: qty 10

## 2024-07-12 MED ORDER — HEPARIN (PORCINE) 25000 UT/250ML-% IV SOLN
1600.0000 [IU]/h | INTRAVENOUS | Status: DC
  Administered 2024-07-12 – 2024-07-13 (×2): 1600 [IU]/h via INTRAVENOUS
  Filled 2024-07-12 (×2): qty 250

## 2024-07-12 MED ORDER — MIDAZOLAM HCL 2 MG/2ML IJ SOLN
INTRAMUSCULAR | Status: AC
  Filled 2024-07-12: qty 2

## 2024-07-12 MED ORDER — MIDAZOLAM HCL 2 MG/2ML IJ SOLN
INTRAMUSCULAR | Status: AC | PRN
  Administered 2024-07-12: 1 mg via INTRAVENOUS
  Administered 2024-07-12 (×3): .5 mg via INTRAVENOUS

## 2024-07-12 MED ORDER — DOCUSATE SODIUM 100 MG PO CAPS
100.0000 mg | ORAL_CAPSULE | Freq: Two times a day (BID) | ORAL | Status: DC | PRN
  Administered 2024-07-14 – 2024-07-19 (×6): 100 mg via ORAL
  Filled 2024-07-12 (×4): qty 1

## 2024-07-12 MED ORDER — IOHEXOL 350 MG/ML SOLN
100.0000 mL | Freq: Once | INTRAVENOUS | Status: AC | PRN
  Administered 2024-07-12: 100 mL via INTRAVENOUS

## 2024-07-12 MED ORDER — POLYETHYLENE GLYCOL 3350 17 G PO PACK
17.0000 g | PACK | Freq: Every day | ORAL | Status: DC | PRN
  Administered 2024-07-15 – 2024-07-17 (×3): 17 g via ORAL
  Filled 2024-07-12 (×3): qty 1

## 2024-07-12 MED ORDER — HEPARIN BOLUS VIA INFUSION
5500.0000 [IU] | Freq: Once | INTRAVENOUS | Status: AC
  Administered 2024-07-12: 5500 [IU] via INTRAVENOUS
  Filled 2024-07-12: qty 5500

## 2024-07-12 MED ORDER — LIDOCAINE HCL 1 % IJ SOLN
INTRAMUSCULAR | Status: AC
Start: 2024-07-12 — End: 2024-07-12
  Filled 2024-07-12: qty 20

## 2024-07-12 MED ORDER — SODIUM CHLORIDE 0.9 % IV BOLUS
1000.0000 mL | Freq: Once | INTRAVENOUS | Status: AC
  Administered 2024-07-12: 1000 mL via INTRAVENOUS

## 2024-07-12 MED ORDER — CHLORHEXIDINE GLUCONATE CLOTH 2 % EX PADS
6.0000 | MEDICATED_PAD | Freq: Every day | CUTANEOUS | Status: DC
  Administered 2024-07-13 – 2024-07-20 (×9): 6 via TOPICAL

## 2024-07-12 MED ORDER — IOHEXOL 300 MG/ML  SOLN
150.0000 mL | Freq: Once | INTRAMUSCULAR | Status: AC | PRN
  Administered 2024-07-12: 85 mL via INTRAVENOUS

## 2024-07-12 MED ORDER — HEPARIN SODIUM (PORCINE) 1000 UNIT/ML IJ SOLN
INTRAMUSCULAR | Status: AC | PRN
  Administered 2024-07-12: 10000 [IU] via INTRAVENOUS

## 2024-07-12 MED ORDER — LIDOCAINE-EPINEPHRINE 1 %-1:100000 IJ SOLN
INTRAMUSCULAR | Status: AC
  Filled 2024-07-12: qty 1

## 2024-07-12 NOTE — Progress Notes (Signed)
 Chaplain responded to Level 1 Trauma for 58 yo male on thinners who was found unresponsive on the floor by family members.  Chaplain spoke with pt who asked if Chaplain was Catholic.   No, are you? the Chaplain asked.  The pt responded he was not a Christian at all, which the Chaplain affirmed as being just fine.  Pt went on tell Chaplain of the belief that he would see his father again when he died.  Chaplain celebrated that belief with the pt.    Chaplain found pt's family in ED waiting area and escorted them to pt's bedside.  [place] Chaplain

## 2024-07-12 NOTE — Progress Notes (Signed)
 ANTICOAGULATION CONSULT NOTE  Pharmacy Consult for Heparin  Indication: pulmonary embolus  Allergies  Allergen Reactions   Penicillins Hives    Has patient had a PCN reaction causing immediate rash, facial/tongue/throat swelling, SOB or lightheadedness with hypotension: No Has patient had a PCN reaction causing severe rash involving mucus membranes or skin necrosis: No Has patient had a PCN reaction that required hospitalization No Has patient had a PCN reaction occurring within the last 10 years: No If all of the above answers are NO, then may proceed with Cephalosporin use.    Patient Measurements:   Heparin  Dosing Weight: 90 kg  Vital Signs: Temp: 97.7 F (36.5 C) (08/05 1755) Temp Source: Axillary (08/05 1755) BP: 62/48 (08/05 1755) Pulse Rate: 114 (08/05 1755)  Labs: Recent Labs    07/12/24 1803 07/12/24 1809  HGB 14.4 16.0  HCT 46.5 47.0  PLT 67*  --   LABPROT 16.6*  --   INR 1.3*  --   CREATININE  --  1.70*    Estimated Creatinine Clearance: 56.8 mL/min (A) (by C-G formula based on SCr of 1.7 mg/dL (H)).   Medical History: Past Medical History:  Diagnosis Date   Anemia    Carpal tunnel syndrome    Deep vein thrombosis (HCC) 07/2013, 10/2014, 03/16/2015   LLE (studies at [facility])   Depression    Diabetes mellitus    Hyperlipidemia    Hypertension    Obesity    Osteoarthritis    Peripheral vascular disease (HCC)    6 yrs ago, DVT in Lt knee/ groin   Pulmonary embolism (HCC)    hx of x 3 per patient; most recent 02/2014   Sinusitis    Sleep apnea     Medications:  (Not in a hospital admission)  Scheduled:  Infusions:  PRN:   Assessment: 67 yom with a history of DM, PE on warfarin and IVC in place, chronic DVT. Patient is presenting with fell and hypotension. Heparin  per pharmacy consult placed for pulmonary embolus.  CCM is discussing if thrombolytic candidate. This is complicated by the fact that patient is on warfarin PTA. INR 1.3 on arrival  and thrombocytopenic ( plt 67) on arrival.  CTA w/ acute bilateral pulmonary emboli with large clot burden and right heart strain c/w submassive  PT/INR 16.6/1.3 Hgb 16; plt 67  Goal of Therapy:  Heparin  level 0.3-0.7 units/ml Monitor platelets by anticoagulation protocol: Yes   Plan:  Give IV heparin  5500 units bolus x 1 Start heparin  infusion at 1600 units/hr Check anti-Xa level at 0200 Daily HL while on heparin  Continue to monitor H&H and platelets  Dorn Buttner, PharmD, BCPS 07/12/2024 7:07 PM ED Clinical Pharmacist -  [phone]

## 2024-07-12 NOTE — H&P (Addendum)
 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  He says he went out of his apartment to do his laundry as he normally dose once a month to the laundry mat when he suddenly began to have SOB, light headedness, says it all went white.  He was found by a neighbor who then call his month with who he lives and mother called 911.  Hematoma to back of his head and BP 70/30, he was diaphoretic, and c/o chest pains.  He was brought to ED again as a Level I trauma, however, no head bleed and no signs of trauma.  He was found to have a saddle PE with right heart strian.  His INR was 1.3 and he has been compliant with his Coumadin .  He says he gets his INR checked once every 3 months.   His Troponin was 191, LA 3.5, Cr 1.70.  He was given 1 liter NS bolus and starting on Heparin  drip, noting Platelets of 67 (chronically low but not this low). Patient seen on 7/31 in ED for SOB and back pain and thought he had a PE at that time, but CTA then on 7/31 showed old Pes. Pertinent  Medical History   Obesity, DMT2 on insulin , HTN, DVT/Pes in the past on Coumadin , Depression  Significant Hospital Events: Including procedures, antibiotic start and stop dates in addition to other pertinent events   8/5:  Admit to ICU  Interim History / Subjective:  N/a  Objective    Blood pressure (!) 81/68, pulse (!) 111, temperature 97.7 F (36.5 C), temperature source Axillary, resp. rate 14, height 5' 6 (1.676 m), weight 113.9 kg, SpO2 100%.       No intake or output data in the 24 hours ending 07/12/24 1940 Filed Weights   07/12/24 1907  Weight: 113.9 kg     Examination: General: alert SOB and in mild distress HENT: [place]/AT, PERRLA no icterus EOMI Lungs: CTA no wheezes no rales no rhonchi Cardiovascular: Reg s1s2 no murmurs or gallops Abdomen: Soft NT ND BS pos no guarding, obese Extremities: mild edema Left slightly more so than the right, no cyanosis, compression socks to calf on b/l Neuro: AAO x 3, CN II to XII grossly intact GU:  N/A  Resolved problem list   Assessment and Plan  Syncope Secondary to saddle emboli and obstructive shock No head bleed on CT head, mild right parietal scalp contusion.  Saddle Pulmonary Emboli Simplified PESI score 3, suggesting high risk PESI score 137 class V, very high risk Plan for Heparin  Drip and Thrombolectomy by IR Will need modification of his Coumadin  and more frequent testing Obstructive shock CTA showing saddle PE with right heart strain Thrombolectomy, Heparin  drip and IV fluid bolus Improvements to Preload AKI Likely from hypotension Monitor serum Cr Lactic Acidosis Secondary to the obstructive shock and should improve with above mentioned treatment plan DMT2 FS and SSI HTN by history Currently hypotensive Home meds when possible Morbid Obesity Risk factor for PE and modifiable Thrombocytopenia Not sure if he sees a Hematologist, will consider a consultation in a.m. Will monitor for now  Best  Practice (right click and Reselect all SmartList Selections daily)   Diet/type: NPO w/ oral meds DVT prophylaxis systemic heparin  Pressure ulcer(s): N/A GI prophylaxis: N/A Lines: N/A Foley:  N/A Code Status:  full code   Labs   CBC: Recent Labs  Lab 07/07/24 1024 07/12/24 1803 07/12/24 1809  WBC 4.3 5.2  --   NEUTROABS 2.4  --   --   HGB 15.0 14.4 16.0  HCT 47.2 46.5 47.0  MCV 98.3 100.9*  --   PLT 79* 67*  --     Basic Metabolic Panel: Recent Labs  Lab 07/07/24 1024 07/12/24 1803 07/12/24 1809  NA 138 138 141  K 4.7 4.6 4.0  CL 112* 109 113*  CO2 15* 16*   --   GLUCOSE 149* 241* 238*  BUN 24* 32* 32*  CREATININE 1.06 1.76* 1.70*  CALCIUM  9.0 8.8*  --    GFR: Estimated Creatinine Clearance: 56.8 mL/min (A) (by C-G formula based on SCr of 1.7 mg/dL (H)). Recent Labs  Lab 07/07/24 1024 07/12/24 1803 07/12/24 1809  WBC 4.3 5.2  --   LATICACIDVEN  --   --  3.5*    Liver Function Tests: Recent Labs  Lab 07/12/24 1803  AST 360*  ALT 311*  ALKPHOS 85  BILITOT 1.0  PROT 6.5  ALBUMIN 3.3*   No results for input(s): LIPASE, AMYLASE in the last 168 hours. No results for input(s): AMMONIA in the last 168 hours.  ABG    Component Value Date/Time   PHART 7.355 02/25/2014 0047   PCO2ART 40.6 02/25/2014 0047   PO2ART 332.0 (H) 02/25/2014 0047   HCO3 22.8 02/25/2014 0047   TCO2 17 (L) 07/12/2024 1809   ACIDBASEDEF 3.0 (H) 02/25/2014 0047   O2SAT 100.0 02/25/2014 0047     Coagulation Profile: Recent Labs  Lab 07/07/24 1035 07/12/24 1803  INR 1.2 1.3*    Cardiac Enzymes: No results for input(s): CKTOTAL, CKMB, CKMBINDEX, TROPONINI in the last 168 hours.  HbA1C: Hgb A1c MFr Bld  Date/Time Value Ref Range Status  05/21/2021 02:22 AM 7.6 (H) 4.8 - 5.6 % Final    Comment:    (NOTE)         Prediabetes: 5.7 - 6.4         Diabetes: >6.4         Glycemic control for adults with diabetes: <7.0   03/12/2016 07:05 AM 5.9 (H) 4.8 - 5.6 % Final    Comment:    (NOTE)         Pre-diabetes: 5.7 - 6.4         Diabetes: >6.4         Glycemic control for adults with diabetes: <7.0     CBG: No results for input(s): GLUCAP in the last 168 hours.  Review of Systems:   Currently no light headedness or dizziness, positive pressure like chest pain not increased with deep breathing.  Positive SOB/DOE, no hemoptysis  Past Medical History:  He,  has a past medical history of Anemia, Carpal tunnel syndrome, Deep vein thrombosis (HCC) (07/2013, 10/2014, 03/16/2015), Depression, Diabetes mellitus, Hyperlipidemia,  Hypertension, Obesity, Osteoarthritis, Peripheral vascular disease (HCC), Pulmonary embolism (HCC), Sinusitis, and Sleep apnea.   Surgical History:   Past Surgical History:  Procedure Laterality Date   CARDIAC CATHETERIZATION N/A 08/10/2015   Procedure: Left Heart Cath and Coronary Angiography;  Surgeon: Gordy Bergamo, MD;  Location: [facility] INVASIVE CV LAB;  Service: Cardiovascular;  Laterality: N/A;   CYSTOSCOPY WITH RETROGRADE  PYELOGRAM, URETEROSCOPY AND STENT PLACEMENT Bilateral 01/03/2015   Procedure: CYSTOSCOPY WITH RETROGRADE PYELOGRAM, RIGHT URETEROSCOPY AND RIGHT STENT PLACEMENT;  Surgeon: Ricardo Likens, MD;  Location: [facility];  Service: Urology;  Laterality: Bilateral;   CYSTOSCOPY WITH RETROGRADE PYELOGRAM, URETEROSCOPY AND STENT PLACEMENT Right 03/12/2016   Procedure: CYSTOSCOPY WITH RETROGRADE PYELOGRAM, URETEROSCOPY AND STENT PLACEMENT;  Surgeon: Ricardo Likens, MD;  Location: [facility];  Service: Urology;  Laterality: Right;   ENDOVENOUS ABLATION SAPHENOUS VEIN W/ LASER Left 04/20/2018   endovenous laser ablation left greater saphenous vein by Lynwood Collum MD    HOLMIUM LASER APPLICATION Right 01/03/2015   Procedure: HOLMIUM LASER APPLICATION;  Surgeon: Ricardo Likens, MD;  Location: [facility];  Service: Urology;  Laterality: Right;   HOLMIUM LASER APPLICATION Right 03/12/2016   Procedure: HOLMIUM LASER APPLICATION;  Surgeon: Ricardo Likens, MD;  Location: [facility];  Service: Urology;  Laterality: Right;   IVC filter   07/2014    TONSILLECTOMY     tubes removd from ears       Social History:   reports that he has never smoked. He has never used smokeless tobacco. He reports that he does not drink alcohol and does not use drugs.   Family History:  His family history includes Diabetes in his father; Kidney disease in his father. There is no history of Colon cancer, Esophageal cancer, Rectal cancer, or Stomach cancer.   Allergies Allergies  Allergen Reactions   Penicillins Hives    Has patient had  a PCN reaction causing immediate rash, facial/tongue/throat swelling, SOB or lightheadedness with hypotension: No Has patient had a PCN reaction causing severe rash involving mucus membranes or skin necrosis: No Has patient had a PCN reaction that required hospitalization No Has patient had a PCN reaction occurring within the last 10 years: No If all of the above answers are NO, then may proceed with Cephalosporin use.     Home Medications  Prior to Admission medications   Medication Sig Start Date End Date Taking? Authorizing Provider  allopurinol (ZYLOPRIM) 100 MG tablet Take 100 mg by mouth daily.    [provider]  atorvastatin  (LIPITOR) 40 MG tablet Take 40 mg by mouth at bedtime.     [provider]  Capsicum, Cayenne, 455 MG CAPS Take 2 tablets by mouth in the morning and at bedtime.    [provider]  ezetimibe  (ZETIA ) 10 MG tablet Take 10 mg by mouth daily.    [provider]  furosemide  (LASIX ) 40 MG tablet Take 40 mg by mouth every morning.     [provider]  Garlic 1000 MG CAPS Take 1 capsule by mouth in the morning and at bedtime.    [provider]  lisinopril  (PRINIVIL ,ZESTRIL ) 2.5 MG tablet Take 2.5 mg by mouth daily.    [provider]  Magnesium 200 MG CHEW Chew 2 capsules by mouth in the morning and at bedtime.    [provider]  metoprolol  succinate (TOPROL -XL) 50 MG 24 hr tablet Take 50 mg by mouth at bedtime. Take with or immediately following a meal.    [provider]  polycarbophil (FIBERCON) 625 MG tablet Take 625 mg by mouth in the morning, at noon, and at bedtime.    [provider]  potassium chloride  (K-DUR) 10 MEQ tablet Take 10 mEq by mouth at bedtime.     [provider]  SYNJARDY XR 12.04-999 MG TB24 Take 1 tablet by mouth 2 (two) times daily. 07/13/23  [provider]  tamsulosin  (FLOMAX ) 0.4 MG CAPS capsule Take 0.4 mg by mouth daily after  breakfast.    [provider]  TRESIBA FLEXTOUCH 200 UNIT/ML SOPN Inject 26 Units into the skin at bedtime. 08/25/18   [provider]  triamcinolone cream (KENALOG) 0.1 % SMARTSIG:Sparingly Topical Twice Daily PRN 08/26/21   [provider]  TRULICITY 3 MG/0.5ML SOPN As directed 04/13/23   [provider]  Turmeric (QC TUMERIC COMPLEX) 500 MG CAPS Take 500 mg by mouth daily.    [provider]  Ubiquinol 100 MG CAPS Take 1 capsule by mouth daily.    [provider]  warfarin (COUMADIN ) 4 MG tablet Take 8 mg by mouth daily.    [provider]     Critical care time: 76   The patient is critically ill with multiple organ system failure and requires high complexity decision making for assessment and support, frequent evaluation and titration of therapies, advanced monitoring, review of radiographic studies and interpretation of complex data.   Critical Care Time devoted to patient care services, exclusive of separately billable procedures, described in this note is 33 minutes.   Orlin Fairly, MD [facility] See Amion for pager  If no response to pager , please call [phone] until 7pm After 7:00 pm call Elink  [phone] 07/12/2024, 7:40 PM

## 2024-07-12 NOTE — ED Triage Notes (Signed)
 Pt bib [facility] from home. Family found pt unresponsive on the floor with hematoma to the back of the head. 70/30 on scene, c/o cp, sob. Pt is diaphoretic and clammy.

## 2024-07-12 NOTE — Consult Note (Signed)
 Consulting Physician: Deward PARAS Auden Wettstein  Service: Trauma Surgery  CC: Fall on blood thinners  Subjective   Mechanism of Injury: Travis Rollins is an 58 y.o. male who presented as a level 1 trauma after a fall on blood thinners.  He was walking, felt dizzy and fell over and lost consciousness.  He takes coumadin  for history of pulmonary embolisms.  Past Medical History:  Diagnosis Date   Anemia    Carpal tunnel syndrome    Deep vein thrombosis (HCC) 07/2013, 10/2014, 03/16/2015   LLE (studies at [facility])   Depression    Diabetes mellitus    Hyperlipidemia    Hypertension    Obesity    Osteoarthritis    Peripheral vascular disease (HCC)    6 yrs ago, DVT in Lt knee/ groin   Pulmonary embolism (HCC)    hx of x 3 per patient; most recent 02/2014   Sinusitis    Sleep apnea     Past Surgical History:  Procedure Laterality Date   CARDIAC CATHETERIZATION N/A 08/10/2015   Procedure: Left Heart Cath and Coronary Angiography;  Surgeon: Gordy Bergamo, MD;  Location: [facility] INVASIVE CV LAB;  Service: Cardiovascular;  Laterality: N/A;   CYSTOSCOPY WITH RETROGRADE PYELOGRAM, URETEROSCOPY AND STENT PLACEMENT Bilateral 01/03/2015   Procedure: CYSTOSCOPY WITH RETROGRADE PYELOGRAM, RIGHT URETEROSCOPY AND RIGHT STENT PLACEMENT;  Surgeon: Ricardo Likens, MD;  Location: [facility];  Service: Urology;  Laterality: Bilateral;   CYSTOSCOPY WITH RETROGRADE PYELOGRAM, URETEROSCOPY AND STENT PLACEMENT Right 03/12/2016   Procedure: CYSTOSCOPY WITH RETROGRADE PYELOGRAM, URETEROSCOPY AND STENT PLACEMENT;  Surgeon: Ricardo Likens, MD;  Location: [facility];  Service: Urology;  Laterality: Right;   ENDOVENOUS ABLATION SAPHENOUS VEIN W/ LASER Left 04/20/2018   endovenous laser ablation left greater saphenous vein by Lynwood Collum MD    HOLMIUM LASER APPLICATION Right 01/03/2015   Procedure: HOLMIUM LASER APPLICATION;  Surgeon: Ricardo Likens, MD;  Location: [facility];  Service: Urology;  Laterality: Right;   HOLMIUM LASER  APPLICATION Right 03/12/2016   Procedure: HOLMIUM LASER APPLICATION;  Surgeon: Ricardo Likens, MD;  Location: [facility];  Service: Urology;  Laterality: Right;   IVC filter   07/2014    TONSILLECTOMY     tubes removd from ears      Family History  Problem Relation Age of Onset   Kidney disease Father    Diabetes Father    Colon cancer Neg Hx    Esophageal cancer Neg Hx    Rectal cancer Neg Hx    Stomach cancer Neg Hx     Social:  reports that he has never smoked. He has never used smokeless tobacco. He reports that he does not drink alcohol and does not use drugs.  Allergies:  Allergies  Allergen Reactions   Penicillins Hives    Has patient had a PCN reaction causing immediate rash, facial/tongue/throat swelling, SOB or lightheadedness with hypotension: No Has patient had a PCN reaction causing severe rash involving mucus membranes or skin necrosis: No Has patient had a PCN reaction that required hospitalization No Has patient had a PCN reaction occurring within the last 10 years: No If all of the above answers are NO, then may proceed with Cephalosporin use.    Medications: Current Outpatient Medications  Medication Instructions   allopurinol (ZYLOPRIM) 100 mg, Daily   atorvastatin  (LIPITOR) 40 mg, Daily at bedtime   Capsicum, Cayenne, 455 MG CAPS 2 tablets, 2 times daily   ezetimibe  (ZETIA ) 10 mg, Daily   furosemide  (LASIX )  40 mg, Every morning   Garlic 1000 MG CAPS 1 capsule, 2 times daily   lisinopril  (ZESTRIL ) 2.5 mg, Daily   Magnesium 200 MG CHEW 2 capsules, 2 times daily   metoprolol  succinate (TOPROL -XL) 50 mg, Daily at bedtime   polycarbophil (FIBERCON) 625 mg, 3 times daily   potassium chloride  (K-DUR) 10 MEQ tablet 10 mEq, Daily at bedtime   SYNJARDY XR 12.04-999 MG TB24 1 tablet, 2 times daily   tamsulosin  (FLOMAX ) 0.4 mg, Daily after breakfast   Tresiba FlexTouch 26 Units, Daily at bedtime   triamcinolone cream (KENALOG) 0.1 % SMARTSIG:Sparingly Topical Twice  Daily PRN   TRULICITY 3 MG/0.5ML SOPN As directed   Turmeric (QC TUMERIC COMPLEX) 500 mg, Daily   Ubiquinol 100 MG CAPS 1 capsule, Daily   warfarin (COUMADIN ) 8 mg, Daily    Objective   Primary Survey: SpO2 99%. Airway: Patent, protecting airway Breathing: Bilateral breath sounds, breathing spontaneously Circulation: Intermittent hypotension without decreased mentation Disability: Moving all extremities,   GCS Eyes: 4 - Eyes open spontaneously  GCS Verbal: 5 - Oriented  GCS Motor: 6 - Obeys commands for movement  GCS 15 Environment/Exposure: Warm, dry  Secondary Survey: Head: Normocephalic, atraumatic Neck: Full range of motion without pain, no midline tenderness Chest: Bilateral breath sounds, chest wall stable Abdomen: Soft, non-tender, non-distended Upper Extremities: Strength and sensation intact, palpable peripheral pulses Lower extremities: Strength and sensation intact, palpable peripheral pulses Back: No step offs or deformities, atraumatic Rectal: Deferred Psych: Normal mood and affect   No results found for this or any previous visit (from the past 24 hours).   Imaging Orders         CT HEAD WO CONTRAST         CT CERVICAL SPINE WO CONTRAST         CT Angio Chest/Abd/Pel for Dissection W and/or Wo Contrast      Assessment and Plan   Travis Rollins is an 58 y.o. male who presented as a level 1 trauma after a Fall on blood thinners.  Injuries: Initial trauma evaluation reveals new saddle pulmonary embolism and no traumatic injuries.  Recommend evaluation and admission by medical service.  Discussed with Dr. Dean who will coordinate.  Trauma service will sign off.  Dispo - Per medicine    Deward JINNY Foy, MD  [facility] Use [email or web address] to contact on call provider

## 2024-07-12 NOTE — Procedures (Signed)
 Interventional Radiology Procedure Note  Procedure:  1) IVC filter retrieval 2) Pulmonary angiogram and central manometry 3) Pulmonary aspiration thrombectomy 4) IVC filter placement  Findings: Please refer to procedural dictation for full description. Right internal jugular 11 Fr acces.  Left common femoral vein 24 Fr access.  Perclose x2 failed at right groin site, therefore pursestring suture tied to achieve hemostasis.      Complications: None immediate  Estimated Blood Loss: 100 mL  Recommendations: Strict 4 hour bedrest, head of bed up to 30 degrees. Continue anticoagulation. Follow up echocardiogram/lower extremity venous duplex. IR will follow.   Travis Sides, MD

## 2024-07-12 NOTE — ED Notes (Signed)
 Trauma Response Nurse Documentation   Travis Rollins is a 58 y.o. male arriving to [facility] ED via EMS  On warfarin daily. Trauma was activated as a Level 1 by Dr Dean based on the following trauma criteria Anytime Systolic Blood Pressure < 90.  Patient cleared for CT by Dr. Dean. Pt transported to CT with trauma response nurse present to monitor. RN remained with the patient throughout their absence from the department for clinical observation.   GCS 15.  Trauma MD Arrival Time: 78 Dr Lyndel.  History   Past Medical History:  Diagnosis Date   Anemia    Carpal tunnel syndrome    Deep vein thrombosis (HCC) 07/2013, 10/2014, 03/16/2015   LLE (studies at [facility])   Depression    Diabetes mellitus    Hyperlipidemia    Hypertension    Obesity    Osteoarthritis    Peripheral vascular disease (HCC)    6 yrs ago, DVT in Lt knee/ groin   Pulmonary embolism (HCC)    hx of x 3 per patient; most recent 02/2014   Sinusitis    Sleep apnea      Past Surgical History:  Procedure Laterality Date   CARDIAC CATHETERIZATION N/A 08/10/2015   Procedure: Left Heart Cath and Coronary Angiography;  Surgeon: Gordy Bergamo, MD;  Location: [facility] INVASIVE CV LAB;  Service: Cardiovascular;  Laterality: N/A;   CYSTOSCOPY WITH RETROGRADE PYELOGRAM, URETEROSCOPY AND STENT PLACEMENT Bilateral 01/03/2015   Procedure: CYSTOSCOPY WITH RETROGRADE PYELOGRAM, RIGHT URETEROSCOPY AND RIGHT STENT PLACEMENT;  Surgeon: Ricardo Likens, MD;  Location: [facility];  Service: Urology;  Laterality: Bilateral;   CYSTOSCOPY WITH RETROGRADE PYELOGRAM, URETEROSCOPY AND STENT PLACEMENT Right 03/12/2016   Procedure: CYSTOSCOPY WITH RETROGRADE PYELOGRAM, URETEROSCOPY AND STENT PLACEMENT;  Surgeon: Ricardo Likens, MD;  Location: [facility];  Service: Urology;  Laterality: Right;   ENDOVENOUS ABLATION SAPHENOUS VEIN W/ LASER Left 04/20/2018   endovenous laser ablation left greater saphenous vein by Lynwood Collum MD    HOLMIUM LASER  APPLICATION Right 01/03/2015   Procedure: HOLMIUM LASER APPLICATION;  Surgeon: Ricardo Likens, MD;  Location: [facility];  Service: Urology;  Laterality: Right;   HOLMIUM LASER APPLICATION Right 03/12/2016   Procedure: HOLMIUM LASER APPLICATION;  Surgeon: Ricardo Likens, MD;  Location: [facility];  Service: Urology;  Laterality: Right;   IVC filter   07/2014    TONSILLECTOMY     tubes removd from ears       Initial Focused Assessment (If applicable, or please see trauma documentation): Airway: Intact, patent, pt missing teeth (baseline). No obstructions Breathing: Pt c/o SOB and CP. SpO2 97% on RA. Bilateral breath sounds auscultated.  Circulation: Hypotension present.  Manual BP 62/42. Difficult to palpate peripheral pulses initially.  20G PIV to R FA. No external hemorrhage noted.  Disability: A/Ox4. PERRLA. Pt has no signs of trauma and denies head or neck pain. No C-collar present.   CT's Completed:   CT Head, CT C-Spine, and CT Chest w/ contrast CT chest PE study.  Interventions:  1L NS given  18G PIV to L FA CT head, c-spine and PE study done Spoke with pt's family about plan of care.   Plan for disposition:  Admission to floor - dependent on medicine workup.   Consults completed:  No trauma consults completed other than trauma surgeon: Dr Lyndel at bedside at 1805.  Event Summary: Pt was BIB EMS after pt sustained a fall while walking to the laundry mat.  Pt reports that he  remembers feeling dizzy and then saw white and woke up on the ground.  Per witnesses, pt did hit his head.  Pt came in as a NAT due to story initially being unclear.  After pt arrived to the ED in the green zone, it was then determined that pt is on coumadin  and did potentially hit his head.  Pt also hypotensive on arrival to ED; therefore a level 1 trauma was paged out by EDP. Pt is currently lucid and only complains of SOB, chest pain and back pain. Pt's CT revealed a new saddle PE.  Medicine to admit and  trauma to sign off.   Bedside handoff with ED RN Dena.    Travis Rollins  Trauma Response RN  Please call TRN at [phone] for further assistance.

## 2024-07-12 NOTE — ED Provider Notes (Signed)
 Travis Rollins Provider Note   CSN: [id number] Arrival date & time: 07/12/24  1744     Patient presents with: Loss of Consciousness   Travis Rollins is a 58 y.o. male.   Pt is a 58 yo male with pmhx significant for PE/DVT on coumadin , obesity, DM, anemia, thrombocytopenia, depression, OA, HLD, and sinusitis.  Pt was here on 7/31 for SOB and CP.  CT Chest neg for PE, but INR only 1.2.  Pt said he's been taking his coumadin .  I don't see if they told him to increase his dose.  Pt said he was walking to the [employer]rom[employer] and developed SOB and CP.  He felt dizzy and said things went black.  He woke up on the ground with people standing over him.  He did hit his head.  BP in the 70s.  EMS did not give him IVFs en route as they were just able to get an IV upon arrival.  Pt is awake and alert now, but is c/o cp and sob.       Prior to Admission medications   Medication Sig Start Date End Date Taking? Authorizing Provider  allopurinol (ZYLOPRIM) 100 MG tablet Take 100 mg by mouth daily.    [provider]  atorvastatin  (LIPITOR) 40 MG tablet Take 40 mg by mouth at bedtime.     [provider]  Capsicum, Cayenne, 455 MG CAPS Take 2 tablets by mouth in the morning and at bedtime.    [provider]  ezetimibe  (ZETIA ) 10 MG tablet Take 10 mg by mouth daily.    [provider]  furosemide  (LASIX ) 40 MG tablet Take 40 mg by mouth every morning.     [provider]  Garlic 1000 MG CAPS Take 1 capsule by mouth in the morning and at bedtime.    [provider]  lisinopril  (PRINIVIL ,ZESTRIL ) 2.5 MG tablet Take 2.5 mg by mouth daily.    [provider]  Magnesium 200 MG CHEW Chew 2 capsules by mouth in the morning and at bedtime.    [provider]  metoprolol  succinate (TOPROL -XL) 50 MG 24 hr tablet Take 50 mg by mouth at bedtime. Take with or immediately following a meal.    [provider]  polycarbophil (FIBERCON) 625 MG tablet Take 625 mg by mouth in the morning, at noon, and at bedtime.    [provider]  potassium chloride  (K-DUR) 10 MEQ tablet Take 10 mEq by mouth at bedtime.     [provider]  SYNJARDY XR 12.04-999 MG TB24 Take 1 tablet by mouth 2 (two) times daily. 07/13/23   [provider]  tamsulosin  (FLOMAX ) 0.4 MG CAPS capsule Take 0.4 mg by mouth daily after breakfast.    [provider]  TRESIBA FLEXTOUCH 200 UNIT/ML SOPN Inject 26 Units into the skin at bedtime. 08/25/18   [provider]  triamcinolone cream (KENALOG) 0.1 % SMARTSIG:Sparingly Topical Twice Daily PRN 08/26/21   [provider]  TRULICITY 3 MG/0.5ML SOPN As directed 04/13/23   [provider]  Turmeric (QC TUMERIC COMPLEX) 500 MG CAPS Take 500 mg by mouth daily.    [provider]  Ubiquinol 100 MG CAPS Take 1 capsule by mouth daily.    [provider]  warfarin (COUMADIN ) 4 MG tablet Take 8 mg by mouth daily.    [provider]    Allergies: Penicillins    Review of Systems  Respiratory:  Positive for shortness of breath.   Cardiovascular:  Positive for chest pain.  Neurological:  Positive for syncope.  All other systems reviewed and are negative.   Updated Vital Signs BP (!) 81/68   Pulse (!) 111   Temp 97.7 F (36.5 C) (Axillary)   Resp 14   Ht 5' 6 (1.676 m)   Wt 113.9 kg   SpO2 100%   BMI 40.53 kg/m   Physical Exam Vitals and nursing note reviewed.  Constitutional:      General: He is in acute distress.     Appearance: He is obese. He is ill-appearing.  HENT:     Head: Normocephalic.     Comments: Hematoma posterior head    Right Ear: External ear normal.     Left Ear: External ear normal.     Nose: Nose normal.     Mouth/Throat:     Mouth: Mucous membranes are moist.     Pharynx: Oropharynx is clear.  Eyes:     Extraocular Movements: Extraocular movements intact.      Conjunctiva/sclera: Conjunctivae normal.     Pupils: Pupils are equal, round, and reactive to light.  Cardiovascular:     Rate and Rhythm: Regular rhythm. Tachycardia present.     Pulses: Normal pulses.     Heart sounds: Normal heart sounds.  Pulmonary:     Effort: Pulmonary effort is normal.     Breath sounds: Normal breath sounds.  Abdominal:     General: Abdomen is flat. Bowel sounds are normal.     Palpations: Abdomen is soft.  Musculoskeletal:        General: Normal range of motion.     Comments: Both legs in compression hose  Skin:    General: Skin is warm.     Capillary Refill: Capillary refill takes less than 2 seconds.  Neurological:     General: No focal deficit present.     Mental Status: He is alert and oriented to person, place, and time.  Psychiatric:        Mood and Affect: Mood normal.        Behavior: Behavior normal.     (all labs ordered are listed, but only abnormal results are displayed) Labs Reviewed  COMPREHENSIVE METABOLIC PANEL WITH GFR - Abnormal; Notable for the following components:      Result Value   CO2 16 (*)    Glucose, Bld 241 (*)    BUN 32 (*)    Creatinine, Ser 1.76 (*)    Calcium  8.8 (*)    Albumin 3.3 (*)    AST 360 (*)    ALT 311 (*)    GFR, Estimated 45 (*)    All other components within normal limits  CBC - Abnormal; Notable for the following components:   MCV 100.9 (*)    Platelets 67 (*)    All other components within normal limits  PROTIME-INR - Abnormal; Notable for the following components:   Prothrombin Time 16.6 (*)    INR 1.3 (*)    All other components within normal limits  LACTIC ACID, PLASMA - Abnormal; Notable for the following components:   Lactic Acid, Venous 3.6 (*)    All other components within normal limits  I-STAT CHEM 8, ED - Abnormal; Notable for the following components:   Chloride 113 (*)    BUN 32 (*)    Creatinine, Ser 1.70 (*)    Glucose, Bld 238 (*)    TCO2 17 (*)  All other components  within normal limits  I-STAT CG4 LACTIC ACID, ED - Abnormal; Notable for the following components:   Lactic Acid, Venous 3.5 (*)    All other components within normal limits  TROPONIN I (HIGH SENSITIVITY) - Abnormal; Notable for the following components:   Troponin I (High Sensitivity) 191 (*)    All other components within normal limits  MRSA NEXT GEN BY PCR, NASAL  ETHANOL  BRAIN NATRIURETIC PEPTIDE  URINALYSIS, ROUTINE W REFLEX MICROSCOPIC  HEPARIN  LEVEL (UNFRACTIONATED)  HEPARIN  LEVEL (UNFRACTIONATED)  HIV ANTIBODY (ROUTINE TESTING W REFLEX)  CBC  BASIC METABOLIC PANEL WITH GFR  MAGNESIUM  PHOSPHORUS  HEMOGLOBIN A1C  SAMPLE TO BLOOD BANK  TYPE AND SCREEN  TROPONIN I (HIGH SENSITIVITY)    EKG: EKG Interpretation Date/Time:  Tuesday July 12 2024 17:48:46 EDT Ventricular Rate:  110 PR Interval:  137 QRS Duration:  96 QT Interval:  336 QTC Calculation: 455 R Axis:   78  Text Interpretation: Sinus tachycardia RSR' in V1 or V2, right VCD or RVH st depression new Confirmed by Dean Clarity [phone]) on 07/12/2024 6:28:12 PM  Radiology: CT CERVICAL SPINE WO CONTRAST Result Date: 07/12/2024 CLINICAL DATA:  Blunt poly trauma. EXAM: CT CERVICAL SPINE WITHOUT CONTRAST TECHNIQUE: Multidetector CT imaging of the cervical spine was performed without intravenous contrast. Multiplanar CT image reconstructions were also generated. RADIATION DOSE REDUCTION: This exam was performed according to the departmental dose-optimization program which includes automated exposure control, adjustment of the mA and/or kV according to patient size and/or use of iterative reconstruction technique. COMPARISON:  None Available. FINDINGS: Alignment: Straightening of normal lordosis. No traumatic subluxation. Skull base and vertebrae: No acute fracture. Vertebral body heights are maintained. The dens and skull base are intact. Soft tissues and spinal canal: No prevertebral fluid or swelling. No visible canal  hematoma. Disc levels: Anterior and posterior spurring extend from C4-C5 through a C7-T1. Mild multilevel facet hypertrophy. Spinal canal narrowing at C4-C5 and C6-C7. Upper chest: Assessed on concurrent chest CT, reported separately. Other: None. IMPRESSION: Degenerative change in the cervical spine without acute fracture or subluxation. Electronically Signed   By: Andrea Gasman M.D.   On: 07/12/2024 18:48   CT Angio Chest/Abd/Pel for Dissection W and/or Wo Contrast Result Date: 07/12/2024 CLINICAL DATA:  Provided history: Acute aortic syndrome (AAS) suspected Found unresponsive on floor.  Hypotensive. EXAM: CT ANGIOGRAPHY CHEST, ABDOMEN AND PELVIS TECHNIQUE: Non-contrast CT of the chest was initially obtained. Multidetector CT imaging through the chest, abdomen and pelvis was performed using the standard protocol during bolus administration of intravenous contrast. Multiplanar reconstructed images and MIPs were obtained and reviewed to evaluate the vascular anatomy. RADIATION DOSE REDUCTION: This exam was performed according to the departmental dose-optimization program which includes automated exposure control, adjustment of the mA and/or kV according to patient size and/or use of iterative reconstruction technique. CONTRAST:  OMNIPAQUE  IOHEXOL  350 MG/ML SOLN COMPARISON:  Chest CTA 07/07/2024, abdominopelvic venogram 05/21/2021 FINDINGS: CTA CHEST FINDINGS Cardiovascular: No aortic hematoma on noncontrast exam. No dissection or evidence of acute aortic injury. Mild aortic atherosclerosis. Acute bilateral pulmonary emboli that are new from prior exam. Small saddle embolus is new, with large clot burden in the distal main pulmonary arteries extending into many lobar and segmental branches. 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 There is right heart strain with RV to LV ratio of 2. The heart is upper normal in size. There is no  pericardial effusion. Mediastinum/Nodes: No mediastinal hemorrhage or hematoma. Small mediastinal lymph nodes are not enlarged by size criteria. Decompressed esophagus. No pneumomediastinum. No visible thyroid  nodule. Lungs/Pleura: No pneumothorax or pulmonary contusion. Mild heterogeneous pulmonary parenchyma. Scattered areas of scarring and subpleural reticulation, unchanged from recent exam. No evidence of pulmonary infarct or focal airspace disease. No pleural fluid. Trachea and central airways are clear. Musculoskeletal: No acute fracture of the ribs, sternum, included clavicles or shoulder girdles. Diffuse degenerative change in the thoracic spine without thoracic spine fracture. Partial fusion of the posterior elements of T12-L1. No confluent body wall contusion. Review of the MIP images confirms the above findings. CTA ABDOMEN AND PELVIS FINDINGS VASCULAR Aorta: No aortic aneurysm or dissection. There is periaortic and retroperitoneal stranding at the L2 level, also at the level of IVC filter, but no discrete aortic injury. No aortic irregularity, aortic contours are smooth. Mild aortic atherosclerosis. Celiac: Patent without evidence of aneurysm, dissection, vasculitis or significant stenosis. SMA: Patent without evidence of aneurysm, dissection, vasculitis or significant stenosis. Renals: Both renal arteries are patent without evidence of aneurysm, dissection, vasculitis, fibromuscular dysplasia or significant stenosis. IMA: Patent without evidence of aneurysm, dissection, vasculitis or significant stenosis. Inflow: Patent without evidence of aneurysm, dissection, vasculitis or significant stenosis. Veins: The venous structures are not well assessed on this arterial phase exam. IVC filter in place. There is retroperitoneal stranding and edema at the level of the filter. Review of the MIP images confirms the above findings. NON-VASCULAR Hepatobiliary: Arterial phase imaging limits detailed assessment. The  liver is enlarged with steatosis. There is no evidence of perihepatic hematoma or focal liver abnormality. No evidence of liver injury. Unremarkable gallbladder. Pancreas: No evidence of pancreatic injury. No ductal dilatation or inflammation. Spleen: No perisplenic hematoma.  Normal arterial phase enhancement. Adrenals/Urinary Tract: No adrenal hemorrhage. No evidence of renal injury. No hydronephrosis. Nondistended urinary bladder. Stomach/Bowel: No evidence of bowel injury or inflammation. No bowel wall thickening. Small to moderate colonic stool burden. Occasional colonic diverticula without diverticulitis. Lymphatic: No adenopathy. Reproductive: Prostate is unremarkable. Other: Retroperitoneal stranding adjacent to the aorta and IVC at the L2 level at the level of IVC filter. There is no other free fluid. No free air. Moderate fat containing umbilical hernia. No confluent body wall contusion. Musculoskeletal: No acute fracture of the pelvis or lumbar spine. Diffuse lumbar degenerative change. Review of the MIP images confirms the above findings. IMPRESSION: 1. Acute bilateral pulmonary emboli with large clot burden and right heart strain, (RV/LV Ratio = 2) consistent with at least submassive (intermediate risk) PE. The presence of right heart strain has been associated with an increased risk of morbidity and mortality. Please refer to the Code PE Focused order set in EPIC. 2. Retroperitoneal stranding adjacent to the abdominal aorta and IVC, at the level of IVC filter. There is no evidence of discrete abdominal aortic injury, however stranding etiology is indeterminate in etiology. The IVC is not well assessed on this venous phase exam. Consider a follow-up exam including venous phase imaging. 3. IVC filter in place. 4. No other evidence of acute traumatic injury to the chest, abdomen, or pelvis. 5. Hepatomegaly and hepatic steatosis. Aortic Atherosclerosis (ICD10-I70.0). Critical Value/emergent results were  called by telephone at the time of interpretation on 07/12/2024 at 6:27 pm to Dr Lyndel, who verbally acknowledged these results. Electronically Signed   By: Andrea Gasman M.D.   On: 07/12/2024 18:46   CT HEAD WO CONTRAST Result Date: 07/12/2024 CLINICAL DATA:  Head trauma, found  on floor with hematoma to back of head. Hypotensive. EXAM: CT HEAD WITHOUT CONTRAST TECHNIQUE: Contiguous axial images were obtained from the base of the skull through the vertex without intravenous contrast. RADIATION DOSE REDUCTION: This exam was performed according to the departmental dose-optimization program which includes automated exposure control, adjustment of the mA and/or kV according to patient size and/or use of iterative reconstruction technique. COMPARISON:  None Available. FINDINGS: Brain: No intracranial hemorrhage, mass effect, or midline shift. No hydrocephalus. The basilar cisterns are patent. No evidence of territorial infarct or acute ischemia. No extra-axial or intracranial fluid collection. Vascular: Atherosclerosis of skullbase vasculature without hyperdense vessel or abnormal calcification. Skull: No fracture or focal lesion. Sinuses/Orbits: No acute fracture or acute finding. Other: Mild right parietal scalp contusion. IMPRESSION: Mild right parietal scalp contusion. No acute intracranial abnormality. No skull fracture. Electronically Signed   By: Andrea Gasman M.D.   On: 07/12/2024 18:34     Procedures   Medications Ordered in the ED  heparin  ADULT infusion 100 units/mL (25000 units/221mL) (1,600 Units/hr Intravenous New Bag/Given 07/12/24 1934)  Chlorhexidine  Gluconate Cloth 2 % PADS 6 each (has no administration in time range)  docusate sodium  (COLACE) capsule 100 mg (has no administration in time range)  polyethylene glycol (MIRALAX  / GLYCOLAX ) packet 17 g (has no administration in time range)  insulin  aspart (novoLOG ) injection 0-9 Units (has no administration in time range)  sodium  chloride 0.9 % bolus 1,000 mL (0 mLs Intravenous Stopped 07/12/24 1935)  iohexol  (OMNIPAQUE ) 350 MG/ML injection 100 mL (100 mLs Intravenous Contrast Given 07/12/24 1829)  heparin  bolus via infusion 5,500 Units (5,500 Units Intravenous Bolus from Bag 07/12/24 1934)  sodium chloride  0.9 % bolus 1,000 mL (1,000 mLs Intravenous New Bag/Given 07/12/24 1938)                                    Medical Decision Making Amount and/or Complexity of Data Reviewed Labs: ordered. Radiology: ordered.  Risk Prescription drug management. Decision regarding hospitalization.   This patient presents to the ED for concern of syncope, this involves an extensive number of treatment options, and is a complaint that carries with it a high risk of complications and morbidity.  The differential diagnosis includes trauma, cardiac event, PE, dehydration   Co morbidities that complicate the patient evaluation  PE/DVT on coumadin , obesity, DM, anemia, thrombocytopenia, depression, OA, HLD, and sinusitis   Additional history obtained:  Additional history obtained from epic chart review External records from outside source obtained and reviewed including EMS report   Lab Tests:  I Ordered, and personally interpreted labs.  The pertinent results include:  cbc nl other than plt low at 67 (stable); cmp with bun 32 and cr 1.76 (cr 1.06 on 7/31); lfts elevated with ast 360 and alt 311 (nl on 7/31); lactic elevated at 3.5; troponin elevated at 191; inr only 1.3   Imaging Studies ordered:  I ordered imaging studies including ct head/c-spine/cta chest/abd/pelvis  I independently visualized and interpreted imaging which showed  CT head: Mild right parietal scalp contusion. No acute intracranial  abnormality. No skull fracture.  CT C-spine: Degenerative change in the cervical spine without acute fracture or  subluxation.  CTA chest/abd/pelvis: . Acute bilateral pulmonary emboli with large clot burden and right   heart strain, (RV/LV Ratio = 2) consistent with at least submassive  (intermediate risk) PE. The presence of right heart strain has been  associated with an increased risk of morbidity and mortality. Please  refer to the Code PE Focused order set in EPIC.  2. Retroperitoneal stranding adjacent to the abdominal aorta and  IVC, at the level of IVC filter. There is no evidence of discrete  abdominal aortic injury, however stranding etiology is indeterminate  in etiology. The IVC is not well assessed on this venous phase exam.  Consider a follow-up exam including venous phase imaging.  3. IVC filter in place.  4. No other evidence of acute traumatic injury to the chest,  abdomen, or pelvis.  5. Hepatomegaly and hepatic steatosis.    Aortic Atherosclerosis (ICD10-I70.0).   I agree with the radiologist interpretation   Cardiac Monitoring:  The patient was maintained on a cardiac monitor.  I personally viewed and interpreted the cardiac monitored which showed an underlying rhythm of: st   Medicines ordered and prescription drug management:  I ordered medication including ivfs/heparin   for PE  Reevaluation of the patient after these medicines showed that the patient improved I have reviewed the patients home medicines and have made adjustments as needed   Test Considered:  ct   Critical Interventions:  Level 1 trauma   Consultations Obtained:  I requested consultation with trauma (Dr. Lyndel),  and discussed lab and imaging findings as well as pertinent plan -he consulted due to level 1 trauma criteria.  No acute trauma, so he will sign off Pt d/w CCM regarding lytics vs IR.  They will see pt and Dr. Maree will admit.   Problem List / ED Course:  Syncope and hypotension with head injury on thinners:  pt met criteria for trauma level 1 activation, so this was called.  Pt's problem is not traumatic, so trauma has signed off. Saddle PE with cor pulmonale:  Pt started  on heparin  until CCM can see pt.  Pt's bp improving with IVFs.     Reevaluation:  After the interventions noted above, I reevaluated the patient and found that they have :improved   Social Determinants of Health:  Lives at home   Dispostion:  After consideration of the diagnostic results and the patients response to treatment, I feel that the patent would benefit from admission.  CRITICAL CARE Performed by: Mliss Boyers   Total critical care time: 45 minutes  Critical care time was exclusive of separately billable procedures and treating other patients.  Critical care was necessary to treat or prevent imminent or life-threatening deterioration.  Critical care was time spent personally by me on the following activities: development of treatment plan with patient and/or surrogate as well as nursing, discussions with consultants, evaluation of patient's response to treatment, examination of patient, obtaining history from patient or surrogate, ordering and performing treatments and interventions, ordering and review of laboratory studies, ordering and review of radiographic studies, pulse oximetry and re-evaluation of patient's condition.        Final diagnoses:  Acute saddle pulmonary embolism with acute cor pulmonale (HCC)  Syncope, unspecified syncope type  Thrombocytopenia (HCC)  AKI (acute kidney injury) (HCC)  Transaminitis    ED Discharge Orders     None          Boyers Mliss, MD 07/12/24 2014

## 2024-07-12 NOTE — ED Notes (Signed)
 CCMD called and notified

## 2024-07-12 NOTE — Consult Note (Signed)
 Chief Complaint: Acute pulmonary embolism  Referring Physician(s): Orlin Fairly, MD  Patient Status: [facility] - ED  History of Present Illness: Travis Rollins is a 58 y.o. male with history of prior DVT and PE presenting after a syncopal event at home with shortness of breath on arrival and concern for possible trauma.  He is on coumadin  for his prior VTE.  No injuries on scans related to trauma.  He is short of breath and states he feels lousy.  Some chest pain.  He is NPO.   No recent surgery or immobilization.  No recent illness or travel.     Past Medical History:  Diagnosis Date   Anemia    Carpal tunnel syndrome    Deep vein thrombosis (HCC) 07/2013, 10/2014, 03/16/2015   LLE (studies at [facility])   Depression    Diabetes mellitus    Hyperlipidemia    Hypertension    Obesity    Osteoarthritis    Peripheral vascular disease (HCC)    6 yrs ago, DVT in Lt knee/ groin   Pulmonary embolism (HCC)    hx of x 3 per patient; most recent 02/2014   Sinusitis    Sleep apnea     Past Surgical History:  Procedure Laterality Date   CARDIAC CATHETERIZATION N/A 08/10/2015   Procedure: Left Heart Cath and Coronary Angiography;  Surgeon: Gordy Bergamo, MD;  Location: [facility] INVASIVE CV LAB;  Service: Cardiovascular;  Laterality: N/A;   CYSTOSCOPY WITH RETROGRADE PYELOGRAM, URETEROSCOPY AND STENT PLACEMENT Bilateral 01/03/2015   Procedure: CYSTOSCOPY WITH RETROGRADE PYELOGRAM, RIGHT URETEROSCOPY AND RIGHT STENT PLACEMENT;  Surgeon: Ricardo Likens, MD;  Location: [facility] ORS;  Service: Urology;  Laterality: Bilateral;   CYSTOSCOPY WITH RETROGRADE PYELOGRAM, URETEROSCOPY AND STENT PLACEMENT Right 03/12/2016   Procedure: CYSTOSCOPY WITH RETROGRADE PYELOGRAM, URETEROSCOPY AND STENT PLACEMENT;  Surgeon: Ricardo Likens, MD;  Location: [facility];  Service: Urology;  Laterality: Right;   ENDOVENOUS ABLATION SAPHENOUS VEIN W/ LASER Left 04/20/2018   endovenous laser ablation left greater saphenous vein by Lynwood Collum  MD    HOLMIUM LASER APPLICATION Right 01/03/2015   Procedure: HOLMIUM LASER APPLICATION;  Surgeon: Ricardo Likens, MD;  Location: [facility] OR[facility];  Service: Urology;  Laterality: Right;   HOLMIUM LASER APPLICATION Right 03/12/2016   Procedure: HOLMIUM LASER APPLICATION;  Surgeon: Ricardo Likens, MD;  Location: [facility];  Service: Urology;  Laterality: Right;   IVC filter   07/2014    TONSILLECTOMY     tubes removd from ears      Allergies: Penicillins  Medications: Prior to Admission medications   Medication Sig Start Date End Date Taking? Authorizing Provider  allopurinol (ZYLOPRIM) 100 MG tablet Take 100 mg by mouth daily.    [provider]  atorvastatin  (LIPITOR) 40 MG tablet Take 40 mg by mouth at bedtime.     [provider]  Capsicum, Cayenne, 455 MG CAPS Take 2 tablets by mouth in the morning and at bedtime.    [provider]  ezetimibe  (ZETIA ) 10 MG tablet Take 10 mg by mouth daily.    [provider]  furosemide  (LASIX ) 40 MG tablet Take 40 mg by mouth every morning.     [provider]  Garlic 1000 MG CAPS Take 1 capsule by mouth in the morning and at bedtime.    [provider]  lisinopril  (PRINIVIL ,ZESTRIL ) 2.5 MG tablet Take 2.5 mg by mouth daily.    [provider]  Magnesium 200 MG CHEW Chew 2 capsules by  mouth in the morning and at bedtime.    [provider]  metoprolol  succinate (TOPROL -XL) 50 MG 24 hr tablet Take 50 mg by mouth at bedtime. Take with or immediately following a meal.    [provider]  polycarbophil (FIBERCON) 625 MG tablet Take 625 mg by mouth in the morning, at noon, and at bedtime.    [provider]  potassium chloride  (K-DUR) 10 MEQ tablet Take 10 mEq by mouth at bedtime.     [provider]  SYNJARDY XR 12.04-999 MG TB24 Take 1 tablet by mouth 2 (two) times daily. 07/13/23   [provider]  tamsulosin  (FLOMAX ) 0.4 MG CAPS capsule Take 0.4 mg by mouth daily  after breakfast.    [provider]  TRESIBA FLEXTOUCH 200 UNIT/ML SOPN Inject 26 Units into the skin at bedtime. 08/25/18   [provider]  triamcinolone cream (KENALOG) 0.1 % SMARTSIG:Sparingly Topical Twice Daily PRN 08/26/21   [provider]  TRULICITY 3 MG/0.5ML SOPN As directed 04/13/23   [provider]  Turmeric (QC TUMERIC COMPLEX) 500 MG CAPS Take 500 mg by mouth daily.    [provider]  Ubiquinol 100 MG CAPS Take 1 capsule by mouth daily.    [provider]  warfarin (COUMADIN ) 4 MG tablet Take 8 mg by mouth daily.    [provider]     Family History  Problem Relation Age of Onset   Kidney disease Father    Diabetes Father    Colon cancer Neg Hx    Esophageal cancer Neg Hx    Rectal cancer Neg Hx    Stomach cancer Neg Hx     Social History   Socioeconomic History   Marital status: Single    Spouse name: Not on file   Number of children: 0   Years of education: Not on file   Highest education level: Not on file  Occupational History   Not on file  Tobacco Use   Smoking status: Never   Smokeless tobacco: Never  Vaping Use   Vaping status: Never Used  Substance and Sexual Activity   Alcohol use: Never   Drug use: No   Sexual activity: Yes    Birth control/protection: Condom  Other Topics Concern   Not on file  Social History Narrative   Not on file   Social Drivers of Health   Financial Resource Strain: Not on file  Food Insecurity: Low Risk  (04/21/2024)   Received from [facility]   Hunger Vital Sign    Within the past 12 months, you worried that your food would run out before you got money to buy more: Never true    Within the past 12 months, the food you bought just didn't last and you didn't have money to get more. : Never true  Transportation Needs: No Transportation Needs (04/21/2024)   Received from [facility]    In the past 12 months, has lack of reliable  transportation kept you from medical appointments, meetings, work or from getting things needed for daily living? : No  Physical Activity: Not on file  Stress: Not on file  Social Connections: Unknown (06/30/2023)   Received from [facility]   Social Network    Social Network: Not on file     Review of Systems: A 12 point ROS discussed and pertinent positives are indicated in the HPI above.  All other systems are negative.  Vital Signs: BP  102/73   Pulse (!) 115   Temp 97.7 F (36.5 C) (Axillary)   Resp 18   Ht 5' 6 (1.676 m)   Wt 113.9 kg   SpO2 100%   BMI 40.53 kg/m    Physical Exam Constitutional:      General: He is in acute distress.  HENT:     Head: Normocephalic.     Mouth/Throat:     Mouth: Mucous membranes are moist.     Comments: MP3 Eyes:     General: No scleral icterus. Cardiovascular:     Rate and Rhythm: Normal rate.  Pulmonary:     Effort: Respiratory distress present.  Abdominal:     General: There is no distension.  Musculoskeletal:     Right lower leg: No edema.     Left lower leg: No edema.  Skin:    General: Skin is warm and dry.     Findings: No erythema.  Neurological:     Mental Status: He is alert and oriented to person, place, and time.     Imaging: CTA Chest: Saddle PE extending into lobar and subsegmental branches bilaterally.   RV:LV = 2  Echocardiogram: Not obtained  Bilateral lower extremity venous duplex: Not obtained  Labs:  BNP: 12 Troponin: 191 LA = 3.6  CBC: Recent Labs    07/07/24 1024 07/12/24 1803 07/12/24 1809  WBC 4.3 5.2  --   HGB 15.0 14.4 16.0  HCT 47.2 46.5 47.0  PLT 79* 67*  --     COAGS: Recent Labs    07/07/24 1035 07/12/24 1803  INR 1.2 1.3*    BMP: Recent Labs    07/07/24 1024 07/12/24 1803 07/12/24 1809  NA 138 138 141  K 4.7 4.6 4.0  CL 112* 109 113*  CO2 15* 16*  --   GLUCOSE 149* 241* 238*  BUN 24* 32* 32*  CALCIUM  9.0 8.8*  --   CREATININE 1.06 1.76* 1.70*   GFRNONAA >60 45*  --     LIVER FUNCTION TESTS: Recent Labs    07/12/24 1803  BILITOT 1.0  AST 360*  ALT 311*  ALKPHOS 85  PROT 6.5  ALBUMIN 3.3*     PESI Score ([employer]alFinder.es): 137, Class V (very high risk)    Assessment and Plan: CHALES PELISSIER is a 58 y.o. male with history of prior venous thromboembolic disease on coumadin  presenting with [employer] high risk acute pulmonary embolism.  Given his presentation and imaging findings on CTA Chest, he would be an excellent candidate for aspiration thrombectomy.    Risks and benefits of pulmonary catheter directed thrombectomy were discussed with the patient including, but not limited to bleeding, infection, vascular injury or contrast induced renal failure, cardiac dysrhythmia, or death.   procedure and to notify your physician if you are concerned that you have suffered a radiation induced injury.    All of the patient's questions were answered, patient is agreeable to proceed.  Consent signed and in chart.      Electronically Signed: Ester JINNY Sides, MD 07/12/2024, 8:46 PM   I spent a total of 20 Minutes in face to face in clinical consultation, greater than 50% of which was counseling/coordinating care for acute pulmonary embolism.

## 2024-07-13 ENCOUNTER — Inpatient Hospital Stay (HOSPITAL_COMMUNITY)

## 2024-07-13 ENCOUNTER — Encounter (HOSPITAL_COMMUNITY)

## 2024-07-13 DIAGNOSIS — I2699 Other pulmonary embolism without acute cor pulmonale: Secondary | ICD-10-CM | POA: Diagnosis not present

## 2024-07-13 DIAGNOSIS — E8729 Other acidosis: Secondary | ICD-10-CM | POA: Diagnosis not present

## 2024-07-13 DIAGNOSIS — I2602 Saddle embolus of pulmonary artery with acute cor pulmonale: Secondary | ICD-10-CM

## 2024-07-13 DIAGNOSIS — E875 Hyperkalemia: Secondary | ICD-10-CM

## 2024-07-13 DIAGNOSIS — R55 Syncope and collapse: Secondary | ICD-10-CM

## 2024-07-13 DIAGNOSIS — J9601 Acute respiratory failure with hypoxia: Secondary | ICD-10-CM | POA: Diagnosis not present

## 2024-07-13 DIAGNOSIS — N179 Acute kidney failure, unspecified: Secondary | ICD-10-CM | POA: Diagnosis not present

## 2024-07-13 DIAGNOSIS — N400 Enlarged prostate without lower urinary tract symptoms: Secondary | ICD-10-CM

## 2024-07-13 LAB — ECHOCARDIOGRAM COMPLETE
AR max vel: 2.34 cm2
AV Area VTI: 2.38 cm2
AV Area mean vel: 2.14 cm2
AV Mean grad: 5 mmHg
AV Peak grad: 8.6 mmHg
Ao pk vel: 1.47 m/s
Area-P 1/2: 4.15 cm2
Height: 66 in
P 1/2 time: 636 ms
S' Lateral: 3.6 cm
Weight: 3619.07 [oz_av]

## 2024-07-13 LAB — CBC
HCT: 43.9 % (ref 39.0–52.0)
HCT: 37.5 % — ABNORMAL LOW (ref 39.0–52.0)
Hemoglobin: 13.8 g/dL (ref 13.0–17.0)
Hemoglobin: 11.8 g/dL — ABNORMAL LOW (ref 13.0–17.0)
MCH: 31.6 pg (ref 26.0–34.0)
MCH: 31.2 pg (ref 26.0–34.0)
MCHC: 31.4 g/dL (ref 30.0–36.0)
MCHC: 31.5 g/dL (ref 30.0–36.0)
MCV: 100.5 fL — ABNORMAL HIGH (ref 80.0–100.0)
MCV: 99.2 fL (ref 80.0–100.0)
Platelets: 54 K/uL — ABNORMAL LOW (ref 150–400)
Platelets: 55 K/uL — ABNORMAL LOW (ref 150–400)
RBC: 4.37 MIL/uL (ref 4.22–5.81)
RBC: 3.78 MIL/uL — ABNORMAL LOW (ref 4.22–5.81)
RDW: 15.5 % (ref 11.5–15.5)
RDW: 15.7 % — ABNORMAL HIGH (ref 11.5–15.5)
WBC: 4 K/uL (ref 4.0–10.5)
WBC: 5.3 K/uL (ref 4.0–10.5)
nRBC: 0 % (ref 0.0–0.2)
nRBC: 0 % (ref 0.0–0.2)

## 2024-07-13 LAB — URINALYSIS, ROUTINE W REFLEX MICROSCOPIC
Bilirubin Urine: NEGATIVE
Glucose, UA: >=500 mg/dL — AB
Ketones, ur: NEGATIVE mg/dL
Leukocytes,Ua: NEGATIVE
Nitrite: NEGATIVE
Protein, ur: 30 mg/dL — AB
Specific Gravity, Urine: 1.031 — ABNORMAL HIGH (ref 1.005–1.030)
pH: 5 (ref 5.0–8.0)

## 2024-07-13 LAB — GLUCOSE, CAPILLARY
Glucose-Capillary: 135 mg/dL — ABNORMAL HIGH (ref 70–99)
Glucose-Capillary: 138 mg/dL — ABNORMAL HIGH (ref 70–99)
Glucose-Capillary: 146 mg/dL — ABNORMAL HIGH (ref 70–99)
Glucose-Capillary: 153 mg/dL — ABNORMAL HIGH (ref 70–99)
Glucose-Capillary: 155 mg/dL — ABNORMAL HIGH (ref 70–99)
Glucose-Capillary: 157 mg/dL — ABNORMAL HIGH (ref 70–99)
Glucose-Capillary: 212 mg/dL — ABNORMAL HIGH (ref 70–99)

## 2024-07-13 LAB — HEPARIN LEVEL (UNFRACTIONATED)
Heparin Unfractionated: >1.1 [IU]/mL — ABNORMAL HIGH (ref 0.30–0.70)
Heparin Unfractionated: >1.1 [IU]/mL — ABNORMAL HIGH (ref 0.30–0.70)
Heparin Unfractionated: 0.78 [IU]/mL — ABNORMAL HIGH (ref 0.30–0.70)

## 2024-07-13 LAB — BASIC METABOLIC PANEL WITH GFR
Anion gap: 9 (ref 5–15)
BUN: 31 mg/dL — ABNORMAL HIGH (ref 6–20)
CO2: 19 mmol/L — ABNORMAL LOW (ref 22–32)
Calcium: 8.5 mg/dL — ABNORMAL LOW (ref 8.9–10.3)
Chloride: 112 mmol/L — ABNORMAL HIGH (ref 98–111)
Creatinine, Ser: 1.38 mg/dL — ABNORMAL HIGH (ref 0.61–1.24)
GFR, Estimated: 60 mL/min — ABNORMAL LOW (ref >60)
Glucose, Bld: 208 mg/dL — ABNORMAL HIGH (ref 70–99)
Potassium: 5.5 mmol/L — ABNORMAL HIGH (ref 3.5–5.1)
Sodium: 140 mmol/L (ref 135–145)

## 2024-07-13 LAB — HIV ANTIBODY (ROUTINE TESTING W REFLEX): HIV Screen 4th Generation wRfx: NONREACTIVE

## 2024-07-13 LAB — TROPONIN I (HIGH SENSITIVITY): Troponin I (High Sensitivity): 1838 ng/L (ref <18)

## 2024-07-13 LAB — MRSA NEXT GEN BY PCR, NASAL: MRSA by PCR Next Gen: NOT DETECTED

## 2024-07-13 LAB — MAGNESIUM: Magnesium: 2.2 mg/dL (ref 1.7–2.4)

## 2024-07-13 LAB — PHOSPHORUS: Phosphorus: 4.1 mg/dL (ref 2.5–4.6)

## 2024-07-13 MED ORDER — INSULIN ASPART 100 UNIT/ML IJ SOLN
0.0000 [IU] | Freq: Three times a day (TID) | INTRAMUSCULAR | Status: DC
  Administered 2024-07-13 (×3): 2 [IU] via SUBCUTANEOUS
  Administered 2024-07-14: 5 [IU] via SUBCUTANEOUS
  Administered 2024-07-14: 3 [IU] via SUBCUTANEOUS
  Administered 2024-07-14: 2 [IU] via SUBCUTANEOUS
  Administered 2024-07-15: 3 [IU] via SUBCUTANEOUS
  Administered 2024-07-15: 6 [IU] via SUBCUTANEOUS
  Administered 2024-07-15: 5 [IU] via SUBCUTANEOUS
  Administered 2024-07-16: 3 [IU] via SUBCUTANEOUS
  Administered 2024-07-16 (×2): 2 [IU] via SUBCUTANEOUS
  Administered 2024-07-17: 5 [IU] via SUBCUTANEOUS
  Administered 2024-07-17: 2 [IU] via SUBCUTANEOUS
  Administered 2024-07-17: 3 [IU] via SUBCUTANEOUS
  Administered 2024-07-18: 2 [IU] via SUBCUTANEOUS
  Administered 2024-07-18 (×3): 3 [IU] via SUBCUTANEOUS
  Administered 2024-07-18: 2 [IU] via SUBCUTANEOUS
  Administered 2024-07-18 – 2024-07-19 (×2): 3 [IU] via SUBCUTANEOUS
  Administered 2024-07-19 (×2): 5 [IU] via SUBCUTANEOUS
  Administered 2024-07-19 (×3): 3 [IU] via SUBCUTANEOUS
  Administered 2024-07-20 (×4): 5 [IU] via SUBCUTANEOUS
  Administered 2024-07-20 (×2): 3 [IU] via SUBCUTANEOUS
  Administered 2024-07-21: 2 [IU] via SUBCUTANEOUS

## 2024-07-13 MED ORDER — INSULIN GLARGINE-YFGN 100 UNIT/ML ~~LOC~~ SOLN
10.0000 [IU] | Freq: Every day | SUBCUTANEOUS | Status: DC
  Administered 2024-07-13 – 2024-07-21 (×12): 10 [IU] via SUBCUTANEOUS
  Filled 2024-07-13 (×10): qty 0.1

## 2024-07-13 MED ORDER — SODIUM ZIRCONIUM CYCLOSILICATE 10 G PO PACK
10.0000 g | PACK | Freq: Once | ORAL | Status: AC
  Administered 2024-07-13: 10 g via ORAL
  Filled 2024-07-13: qty 1

## 2024-07-13 MED ORDER — INSULIN ASPART 100 UNIT/ML IJ SOLN: 0.0000 [IU] | Freq: Three times a day (TID) | INTRAMUSCULAR | Status: DC

## 2024-07-13 MED ORDER — EZETIMIBE 10 MG PO TABS
10.0000 mg | ORAL_TABLET | Freq: Every day | ORAL | Status: DC
  Administered 2024-07-13 – 2024-07-21 (×12): 10 mg via ORAL
  Filled 2024-07-13 (×9): qty 1

## 2024-07-13 MED ORDER — WARFARIN SODIUM 2 MG PO TABS
8.0000 mg | ORAL_TABLET | Freq: Once | ORAL | Status: AC
  Administered 2024-07-13: 8 mg via ORAL
  Filled 2024-07-13: qty 4

## 2024-07-13 MED ORDER — TAMSULOSIN HCL 0.4 MG PO CAPS
0.4000 mg | ORAL_CAPSULE | Freq: Every day | ORAL | Status: DC
  Administered 2024-07-13 – 2024-07-21 (×12): 0.4 mg via ORAL
  Filled 2024-07-13 (×9): qty 1

## 2024-07-13 MED ORDER — INSULIN ASPART 100 UNIT/ML IJ SOLN
0.0000 [IU] | Freq: Every day | INTRAMUSCULAR | Status: DC
  Administered 2024-07-18 – 2024-07-20 (×4): 2 [IU] via SUBCUTANEOUS

## 2024-07-13 MED ORDER — ATORVASTATIN CALCIUM 80 MG PO TABS
80.0000 mg | ORAL_TABLET | Freq: Every day | ORAL | Status: DC
  Administered 2024-07-13 – 2024-07-21 (×12): 80 mg via ORAL
  Filled 2024-07-13 (×9): qty 1

## 2024-07-13 MED ORDER — INSULIN ASPART 100 UNIT/ML IJ SOLN
3.0000 [IU] | Freq: Three times a day (TID) | INTRAMUSCULAR | Status: DC
  Administered 2024-07-13 – 2024-07-19 (×24): 3 [IU] via SUBCUTANEOUS

## 2024-07-13 MED ORDER — WARFARIN - PHARMACIST DOSING INPATIENT: Freq: Every day | Status: DC

## 2024-07-13 MED ORDER — HEPARIN (PORCINE) 25000 UT/250ML-% IV SOLN
1200.0000 [IU]/h | INTRAVENOUS | Status: DC
  Administered 2024-07-13: 1350 [IU]/h via INTRAVENOUS
  Administered 2024-07-14: 1200 [IU]/h via INTRAVENOUS
  Filled 2024-07-13: qty 250

## 2024-07-13 NOTE — Progress Notes (Signed)
 ANTICOAGULATION CONSULT NOTE  Pharmacy Consult for Heparin  Indication: pulmonary embolus  Patient Measurements: Height: 5' 6 (167.6 cm) Weight: 102.6 kg (226 lb 3.1 oz) IBW/kg (Calculated) : 63.8 Heparin  Dosing Weight: 90 kg  Vital Signs: Temp: 97.9 F (36.6 C) (08/06 0000) Temp Source: Axillary (08/05 1800) BP: 105/77 (08/06 0100) Pulse Rate: 92 (08/06 0100)  Labs: Recent Labs    07/12/24 1803 07/12/24 1809 07/13/24 0105  HGB 14.4 16.0 13.8  HCT 46.5 47.0 43.9  PLT 67*  --  54*  LABPROT 16.6*  --   --   INR 1.3*  --   --   HEPARINUNFRC  --   --  >1.10*  CREATININE 1.76* 1.70* 1.38*  TROPONINIHS 191*  --   --     Estimated Creatinine Clearance: 66.2 mL/min (A) (by C-G formula based on SCr of 1.38 mg/dL (H)).   Assessment: 89 yom with a history of DM, PE on warfarin and IVC in place, chronic DVT. Patient is presenting with fell and hypotension. Heparin  per pharmacy consult placed for pulmonary embolus. CTA w/ acute bilateral pulmonary emboli with large clot burden and right heart strain c/w submassive. PT/INR 16.6/1.3; Hgb 16; plt 67  AM: patient s/p IVC filter retrieval/replacement and thrombectomy. Patient was given additional 10,000 units IV in IR ~2200 and received 5500 units bolus at 1930. Heparin  level returned >1.1 (~3h post IR hep bolus) to be expected. Per RN, no signs/symptoms of bleeding, but plts dropped from 67 to 54.  Goal of Therapy:  Heparin  level 0.3-0.7 units/ml Monitor platelets by anticoagulation protocol: Yes   Plan:  Continue heparin  infusion at 1600 units/hr given appropriate dose, level elevated given drawn close to bolus in IR Check anti-Xa level in 6h Daily heparin  level while on heparin  Continue to monitor H&H and platelets  Lynwood Poplar, PharmD, BCPS Clinical Pharmacist 07/13/2024 2:04 AM

## 2024-07-13 NOTE — Progress Notes (Signed)
 Hematoma R buttocks-- marked by nurses. Suspect hematoma is from his fall yesterday. Con't heparin  for now Check CBC this afternoon.  [name], DO 07/13/24 1:53 PM [facility]  For contact information, see Amion. If no response to pager, please call PCCM consult pager. After hours, 7PM- 7AM, please call Elink.

## 2024-07-13 NOTE — Progress Notes (Addendum)
 ANTICOAGULATION CONSULT NOTE  Pharmacy Consult for Heparin  + warfarin Indication: pulmonary embolus  Patient Measurements: Height: 5' 6 (167.6 cm) Weight: 102.6 kg (226 lb 3.1 oz) IBW/kg (Calculated) : 63.8 Heparin  Dosing Weight: 90 kg  Vital Signs: Temp: 98.3 F (36.8 C) (08/06 0700) Temp Source: Axillary (08/06 0700) BP: 108/82 (08/06 0700) Pulse Rate: 87 (08/06 0700)  Labs: Recent Labs    07/12/24 1803 07/12/24 1809 07/13/24 0105  HGB 14.4 16.0 13.8  HCT 46.5 47.0 43.9  PLT 67*  --  54*  LABPROT 16.6*  --   --   INR 1.3*  --   --   HEPARINUNFRC  --   --  >1.10*  CREATININE 1.76* 1.70* 1.38*  TROPONINIHS 191*  --  1,838*    Estimated Creatinine Clearance: 66.2 mL/min (A) (by C-G formula based on SCr of 1.38 mg/dL (H)).   Assessment: 23 yom with a history of DM, PE on warfarin and IVC in place, chronic DVT. Patient is presenting with fell and hypotension. Heparin  per pharmacy consult placed for pulmonary embolus. CTA w/ acute bilateral pulmonary emboli with large clot burden and right heart strain c/w submassive. PT/INR 16.6/1.3; Hgb 16; plt 67. Patient s/p IVC filter retrieval/replacement and thrombectomy on 07/12/24 PM.   PTA warfarin regimen: 4 mg daily INR goal range: 3.0-3.5 (for recurrent pulmonary embolisms)  07/13/24 1200: Heparin  level still remains supratherapeutic (>1.1) on heparin  1600 units/hr. Level appropriately drawn from right arm, heparin  infusing into left arm. Noted patient has possible right gluteal intramuscular hematoma per RN. Hgb stable at 13.8, PLT decreased to 54 this morning. Discussed with MD, plan to re-initiate warfarin today. INR on admission was 1.3, subtherapeutic on PTA regimen.   Goal of Therapy:  Heparin  level 0.3-0.7 units/ml INR Goal: 3.0-3.5 (for recurrent pulmonary embolisms) Monitor platelets by anticoagulation protocol: Yes   Plan:  Pause heparin  for 1 hours, then decrease rate to 1350 units/hr Warfarin 8 mg PO x1  tonight Check anti-Xa level in 6h Daily heparin  level while on heparin  Continue to monitor H&H, platelets, and s/sx of bleeding  Morna Breach, PharmD PGY2 Cardiology Pharmacy Resident 07/13/2024 8:59 AM

## 2024-07-13 NOTE — Progress Notes (Signed)
 Referring Physician(s): Dr. Carlyon Gaskins  Supervising Physician: Johann Sieving  Patient Status:  [facility] - In-pt  Chief Complaint: Prior DVT, PE Syncopal event at home Acute PE  Subjective: Patient resting in bed with complaint of low-level discomfort at his procedure sites. Also complaint of intense tenderness in the right buttocks.  RN states a large hematoma identified this AM.  States his breath has improved and he feels his chest is lighter. Currently on room air.   Allergies: Penicillins  Medications: Prior to Admission medications   Medication Sig Start Date End Date Taking? Authorizing Provider  allopurinol (ZYLOPRIM) 100 MG tablet Take 100 mg by mouth daily.    [provider]  atorvastatin  (LIPITOR) 80 MG tablet Take 80 mg by mouth daily. 04/26/24   [provider]  Capsicum, Cay[place], 455 MG CAPS Take 2 tablets by mouth in the morning and at bedtime.    [provider]  ezetimibe  (ZETIA ) 10 MG tablet Take 10 mg by mouth daily.    [provider]  Febuxostat 80 MG TABS Take 1 tablet by mouth daily. 04/26/24   [provider]  furosemide  (LASIX ) 40 MG tablet Take 40 mg by mouth every morning.     [provider]  Garlic 1000 MG CAPS Take 1 capsule by mouth in the morning and at bedtime.    [provider]  lisinopril  (PRINIVIL ,ZESTRIL ) 2.5 MG tablet Take 2.5 mg by mouth daily.    [provider]  Magnesium 200 MG CHEW Chew 2 capsules by mouth in the morning and at bedtime.    [provider]  metoprolol  succinate (TOPROL -XL) 50 MG 24 hr tablet Take 50 mg by mouth at bedtime. Take with or immediately following a meal.    [provider]  polycarbophil (FIBERCON) 625 MG tablet Take 625 mg by mouth in the morning, at noon, and at bedtime.    [provider]  potassium chloride  (K-DUR) 10 MEQ tablet Take 10 mEq by mouth at bedtime.     [provider]  SYNJARDY XR  12.04-999 MG TB24 Take 1 tablet by mouth 2 (two) times daily. 07/13/23   [provider]  tamsulosin  (FLOMAX ) 0.4 MG CAPS capsule Take 0.4 mg by mouth daily after breakfast.    [provider]  topiramate  (TOPAMAX ) 50 MG tablet Take 50 mg by mouth 2 (two) times daily. 04/26/24   [provider]  TRESIBA FLEXTOUCH 200 UNIT/ML SOPN Inject 26 Units into the skin at bedtime. 08/25/18   [provider]  triamcinolone cream (KENALOG) 0.1 % Apply 1 Application topically 2 (two) times daily as needed (irritation). 08/26/21   [provider]  TRULICITY 3 MG/0.5ML SOPN As directed 04/13/23   [provider]  Turmeric (QC TUMERIC COMPLEX) 500 MG CAPS Take 500 mg by mouth daily.    [provider]  Ubiquinol 100 MG CAPS Take 1 capsule by mouth daily.    [provider]  warfarin (COUMADIN ) 4 MG tablet Take 8 mg by mouth daily.    [provider]     Vital Signs: BP 115/82 (BP Location: Left Arm)   Pulse 84   Temp 98 F (36.7 C) (Oral)   Resp 17   Ht 5' 6 (1.676 m)   Wt 226 lb 3.1 oz (102.6 kg)   SpO2 96%   BMI 36.51 kg/m   Physical Exam NAD, alert Neck: R internal jugular access site intact.  Very small amount of bloody  drainage on dressing which is replaced. Abdomen: soft, non-tender.  Groin: procedure site intact.  Purse-string suture in place.  Small amount of ooze noted from the incision.  Suture to remain in place for now. Dressing replaced.   Imaging: ECHOCARDIOGRAM COMPLETE Result Date: 07/13/2024    ECHOCARDIOGRAM REPORT   Patient Name:   DEMAURION DICIOCCIO Date of Exam: 07/13/2024 Medical Rec #:  [id number]          Height:       66.0 in Accession #:    [id number]         Weight:       226.2 lb Date of Birth:  05-Jan-1966           BSA:          2.107 m Patient Age:    58 years           BP:           108/82 mmHg Patient Gender: M                  HR:           87 bpm. Exam Location:  Inpatient Procedure: 2D Echo, Color  Doppler and Cardiac Doppler (Both Spectral and Color            Flow Doppler were utilized during procedure). Indications:    Pulmonary embolus  History:        Patient has prior history of Echocardiogram examinations, most                 recent 05/21/2021. Signs/Symptoms:Chest Pain.  Sonographer:    Benard Stallion Referring Phys: [id number] NORLEEN BIRCH PAYNE  Sonographer Comments: Technically difficult study due to poor echo windows. IMPRESSIONS  1. Left ventricular ejection fraction, by estimation, is 45 to 50%. The left ventricle has mildly decreased function. Left ventricular endocardial border not optimally defined to evaluate regional wall motion. Left ventricular diastolic parameters were grossly normal.  2. Right ventricular systolic function is mildly reduced. The right ventricular size is normal.  3. The mitral valve is grossly normal. Trivial mitral valve regurgitation. No evidence of mitral stenosis.  4. The aortic valve is grossly normal. Aortic valve regurgitation is mild. No aortic stenosis is present. FINDINGS  Left Ventricle: Left ventricular ejection fraction, by estimation, is 45 to 50%. The left ventricle has mildly decreased function. Left ventricular endocardial border not optimally defined to evaluate regional wall motion. The left ventricular internal cavity size was normal in size. There is no left ventricular hypertrophy. Left ventricular diastolic parameters were normal. Right Ventricle: The right ventricular size is normal. No increase in right ventricular wall thickness. Right ventricular systolic function is mildly reduced. Left Atrium: Left atrial size was normal in size. Right Atrium: Right atrial size was normal in size. Pericardium: There is no evidence of pericardial effusion. Mitral Valve: The mitral valve is grossly normal. Trivial mitral valve regurgitation. No evidence of mitral valve stenosis. Tricuspid Valve: The tricuspid valve is normal in structure. Tricuspid valve  regurgitation is trivial. No evidence of tricuspid stenosis. Aortic Valve: The aortic valve is grossly normal. Aortic valve regurgitation is mild. Aortic regurgitation PHT measures 636 msec. No aortic stenosis is present. Aortic valve mean gradient measures 5.0 mmHg. Aortic valve peak gradient measures 8.6 mmHg. Aortic valve area, by VTI measures 2.38 cm. Pulmonic Valve: The pulmonic valve was normal in structure. Pulmonic valve regurgitation is not visualized. No evidence of pulmonic stenosis. Aorta: The aortic  root is normal in size and structure. Venous: The inferior vena cava was not well visualized. IAS/Shunts: No atrial level shunt detected by color flow Doppler.  LEFT VENTRICLE PLAX 2D LVIDd:         4.60 cm   Diastology LVIDs:         3.60 cm   LV e' medial:    8.27 cm/s LV PW:         0.90 cm   LV E/e' medial:  7.1 LV IVS:        0.90 cm   LV e' lateral:   10.00 cm/s LVOT diam:     2.20 cm   LV E/e' lateral: 5.9 LV SV:         60 LV SV Index:   28 LVOT Area:     3.80 cm  RIGHT VENTRICLE RV S prime:     10.60 cm/s TAPSE (M-mode): 1.9 cm LEFT ATRIUM             Index        RIGHT ATRIUM           Index LA diam:        3.30 cm 1.57 cm/m   RA Area:     20.50 cm LA Vol (A2C):   42.0 ml 19.93 ml/m  RA Volume:   64.50 ml  30.61 ml/m LA Vol (A4C):   43.2 ml 20.50 ml/m LA Biplane Vol: 42.5 ml 20.17 ml/m  AORTIC VALVE AV Area (Vmax):    2.34 cm AV Area (Vmean):   2.14 cm AV Area (VTI):     2.38 cm AV Vmax:           146.50 cm/s AV Vmean:          100.800 cm/s AV VTI:            0.250 m AV Peak Grad:      8.6 mmHg AV Mean Grad:      5.0 mmHg LVOT Vmax:         90.30 cm/s LVOT Vmean:        56.700 cm/s LVOT VTI:          0.157 m LVOT/AV VTI ratio: 0.63 AI PHT:            636 msec  AORTA Ao Root diam: 3.20 cm MITRAL VALVE               TRICUSPID VALVE MV Area (PHT): 4.15 cm    TR Peak grad:   15.8 mmHg MV Decel Time: 183 msec    TR Vmax:        199.00 cm/s MV E velocity: 58.90 cm/s MV A velocity: 79.80 cm/s   SHUNTS MV E/A ratio:  0.74        Systemic VTI:  0.16 m                            Systemic Diam: 2.20 cm Soyla Merck MD Electronically signed by Soyla Merck MD Signature Date/Time: 07/13/2024/11:08:18 AM    Final    IR THROMBECT PRIM MECH INIT (INCLU) MOD SED Result Date: 07/13/2024 INDICATION: [age]-year-old male with history of acute, high risk pulmonary embolism. EXAM: 1. Ultrasound-guided vascular access of the right common femoral vein. 2. Inferior vena cavogram. 3. Ultrasound-guided vascular access of the right internal jugular vein. 4. Inferior vena cava filter retrieval. 5. Pulmonary angiogram. 6. Central manometry. 7. Bilateral pulmonary artery  aspiration thrombectomy. 8. Inferior vena cava filter placement. COMPARISON:  CTA chest from earlier the same day MEDICATIONS: 10,000 units heparin , intravenous ANESTHESIA/SEDATION: Moderate (conscious) sedation was employed during this procedure. A total of Versed  3 mg and Fentanyl  0 mcg was administered intravenously. Moderate Sedation Time: 97 minutes. The patient's level of consciousness and vital signs were monitored continuously by radiology nursing throughout the procedure under my direct supervision. FLUOROSCOPY TIME:  Nine hundred sixteen mGy reference air kerma COMPLICATIONS: None immediate. TECHNIQUE: Informed written consent was obtained from the patient after a thorough discussion of the procedural risks, benefits and alternatives. All questions were addressed. Maximal Sterile Barrier Technique was utilized including caps, mask, sterile gowns, sterile gloves, sterile drape, hand hygiene and skin antiseptic. A timeout was performed prior to the initiation of the procedure. Preprocedure ultrasound evaluation demonstrated patency of the right common femoral vein. The procedure was planned. Subdermal Local anesthesia was administered 1% lidocaine . A small skin nick was made. Under direct ultrasound visualization, the right common femoral vein  was accessed with a 21 gauge micropuncture needle. A permanent ultrasound image was captured and stored in the record. Micropuncture sheath was inserted followed by placement of a Wholey wire was directed to the inferior vena cava under fluoroscopic guidance. An 8 [place] vascular sheath was placed. A pigtail catheter was inserted to the peripheral inferior vena cava and inferior vena cavagram was performed. Inferior vena cavagram was significant for presence of an infrarenal IVC filter with possible nonocclusive thrombus about the apex of the filter with sluggish antegrade flow. Given failure of anticoagulation and indwelling IVC filter with presence of thrombus, decision was made to remove the indwelling filter. Therefore, the right internal jugular vein was accessed with a 21 gauge micropuncture needle. A permanent ultrasound image was captured and stored in the record. A micropuncture sheath was introduced through which a Wholey wire was advanced to the peripheral inferior vena cava. Serial dilation was performed followed by introduction of an argon triple loop snare IVC filter retrieval coaxial kit. The snare was then used to capture the apex of the filter which was retrieved without complication. Repeat inferior vena cavagram was performed which demonstrated patency of the normal caliber inferior vena cava without evidence of filling defect or extravasation. Attention was then turned toward PE thrombectomy. In pre close fashion, 2 ProGlides at the 10 o'clock and 2 o'clock positions. Over the wire, a 24 [place] Inari sheath was then placed and directed to the inferior vena cava. Over the wire, a double angle pigtail catheter was inserted and under fluoroscopic guidance was attempted to be directed through the right atrium and right ventricle to the main pulmonary artery, however due to the size of the right heart this was difficult. Therefore, a Swan-Ganz catheter was inserted with the balloon inflated which was  floated to the main pulmonary artery. Central manometry was performed at this location with pulmonary arterial pressure of 70/38 with a mean of 45 mm Hg. The balloon was deflated and over a 0.025 guidewire the Swan-Ganz catheter was exchanged for a 4 French angled tip glide catheter. A Wholey wire was then inserted and directed into the left inferior pulmonary artery. The catheter was advanced to this location. The catheter was then exchanged for a 6 French angled tip select catheter. The wire was exchanged for a short taper superstiff Amplatz wire. The catheter was removed. A 24 [place] Flowtreiver aspiration catheter was then directed under fluoroscopic guidance to the main pulmonary artery. The inner dilator was  removed. Pulmonary angiogram was then performed which demonstrated similar filling defects in the main and bilateral pulmonary arteries compatible with acute pulmonary embolism. The aspiration catheter was then advanced to the left pulmonary artery. Multiple aspirations were performed which yielded a large volume of acute appearing thrombus. Left pulmonary angiogram was then repeated which demonstrated significantly improved patency and perfusion of the left lung. There is a small volume additional thrombus in the proximal left inferior lobar pulmonary artery. Therefore, the T20 curved aspiration catheter was advanced in coaxial fashion and aspiration was performed which yielded additional small volume acute appearing thrombus. The coaxial system was then directed to the right pulmonary artery in the wire was inserted into the right inferior lobar pulmonary artery. Aspiration thrombectomy was then performed which yielded large volume acute appearing thrombus. The large volume thrombus was affixed to the distal tip of the aspiration catheter and required removal of the coaxial aspiration device over the wire and Ree insertion. At this point, wire access to the right pulmonary artery was lost. Given  significant improvement in vital signs and large volume thrombus, the pulmonary artery was not selected for completion measurements. At this point, the patient's tachycardia improved and oxygen saturations improved. The catheters were removed. Repeat inferior vena cavagram was then performed for planning purposes which demonstrated patency and again no evidence of extravasation. Over the wire, an option elite IVC filter was placed in an infrarenal location under fluoroscopic visualization. The sheath was then removed and the ProGlides were tied and cut. There was insufficient hemostasis, therefore a 0 Prolene pursestring stitch was placed about the right groin access site. Hemostasis was achieved at the right neck and a pressure dressing was applied. The patient tolerated the procedure well and was transferred to the ICU in good condition. IMPRESSION: 1. Inferior vena cavogram demonstrates indwelling inferior vena cava filter with possible adherent thrombus. 2. Technically successful inferior vena cava filter retrieval. 3. Successful bilateral pulmonary artery thrombectomy with removal of large volume acute appearing thrombus. 4. Technically successful inferior vena cava filter placement. Ester Sides, MD [facility] Radiology Electronically Signed   By: Ester Sides M.D.   On: 07/13/2024 08:49   IR US  Guide Vasc Access Right Result Date: 07/13/2024 INDICATION: [age]-year-old male with history of acute, high risk pulmonary embolism. EXAM: 1. Ultrasound-guided vascular access of the right common femoral vein. 2. Inferior vena cavogram. 3. Ultrasound-guided vascular access of the right internal jugular vein. 4. Inferior vena cava filter retrieval. 5. Pulmonary angiogram. 6. Central manometry. 7. Bilateral pulmonary artery aspiration thrombectomy. 8. Inferior vena cava filter placement. COMPARISON:  CTA chest from earlier the same day MEDICATIONS: 10,000 units  heparin , intravenous ANESTHESIA/SEDATION: Moderate (conscious) sedation was employed during this procedure. A total of Versed  3 mg and Fentanyl  0 mcg was administered intravenously. Moderate Sedation Time: 97 minutes. The patient's level of consciousness and vital signs were monitored continuously by radiology nursing throughout the procedure under my direct supervision. FLUOROSCOPY TIME:  Nine hundred sixteen mGy reference air kerma COMPLICATIONS: None immediate. TECHNIQUE: Informed written consent was obtained from the patient after a thorough discussion of the procedural risks, benefits and alternatives. All questions were addressed. Maximal Sterile Barrier Technique was utilized including caps, mask, sterile gowns, sterile gloves, sterile drape, hand hygiene and skin antiseptic. A timeout was performed prior to the initiation of the procedure. Preprocedure ultrasound evaluation demonstrated patency of the right common femoral vein. The procedure was planned. Subdermal Local anesthesia was administered 1% lidocaine . A  small skin nick was made. Under direct ultrasound visualization, the right common femoral vein was accessed with a 21 gauge micropuncture needle. A permanent ultrasound image was captured and stored in the record. Micropuncture sheath was inserted followed by placement of a Wholey wire was directed to the inferior vena cava under fluoroscopic guidance. An 8 [place] vascular sheath was placed. A pigtail catheter was inserted to the peripheral inferior vena cava and inferior vena cavagram was performed. Inferior vena cavagram was significant for presence of an infrarenal IVC filter with possible nonocclusive thrombus about the apex of the filter with sluggish antegrade flow. Given failure of anticoagulation and indwelling IVC filter with presence of thrombus, decision was made to remove the indwelling filter. Therefore, the right internal jugular vein was accessed with a 21 gauge micropuncture needle.  A permanent ultrasound image was captured and stored in the record. A micropuncture sheath was introduced through which a Wholey wire was advanced to the peripheral inferior vena cava. Serial dilation was performed followed by introduction of an argon triple loop snare IVC filter retrieval coaxial kit. The snare was then used to capture the apex of the filter which was retrieved without complication. Repeat inferior vena cavagram was performed which demonstrated patency of the normal caliber inferior vena cava without evidence of filling defect or extravasation. Attention was then turned toward PE thrombectomy. In pre close fashion, 2 ProGlides at the 10 o'clock and 2 o'clock positions. Over the wire, a 24 [place] Inari sheath was then placed and directed to the inferior vena cava. Over the wire, a double angle pigtail catheter was inserted and under fluoroscopic guidance was attempted to be directed through the right atrium and right ventricle to the main pulmonary artery, however due to the size of the right heart this was difficult. Therefore, a Swan-Ganz catheter was inserted with the balloon inflated which was floated to the main pulmonary artery. Central manometry was performed at this location with pulmonary arterial pressure of 70/38 with a mean of 45 mm Hg. The balloon was deflated and over a 0.025 guidewire the Swan-Ganz catheter was exchanged for a 4 French angled tip glide catheter. A Wholey wire was then inserted and directed into the left inferior pulmonary artery. The catheter was advanced to this location. The catheter was then exchanged for a 6 French angled tip select catheter. The wire was exchanged for a short taper superstiff Amplatz wire. The catheter was removed. A 24 [place] Flowtreiver aspiration catheter was then directed under fluoroscopic guidance to the main pulmonary artery. The inner dilator was removed. Pulmonary angiogram was then performed which demonstrated similar filling defects  in the main and bilateral pulmonary arteries compatible with acute pulmonary embolism. The aspiration catheter was then advanced to the left pulmonary artery. Multiple aspirations were performed which yielded a large volume of acute appearing thrombus. Left pulmonary angiogram was then repeated which demonstrated significantly improved patency and perfusion of the left lung. There is a small volume additional thrombus in the proximal left inferior lobar pulmonary artery. Therefore, the T20 curved aspiration catheter was advanced in coaxial fashion and aspiration was performed which yielded additional small volume acute appearing thrombus. The coaxial system was then directed to the right pulmonary artery in the wire was inserted into the right inferior lobar pulmonary artery. Aspiration thrombectomy was then performed which yielded large volume acute appearing thrombus. The large volume thrombus was affixed to the distal tip of the aspiration catheter and required removal of the coaxial aspiration device over the  wire and Ree insertion. At this point, wire access to the right pulmonary artery was lost. Given significant improvement in vital signs and large volume thrombus, the pulmonary artery was not selected for completion measurements. At this point, the patient's tachycardia improved and oxygen saturations improved. The catheters were removed. Repeat inferior vena cavagram was then performed for planning purposes which demonstrated patency and again no evidence of extravasation. Over the wire, an option elite IVC filter was placed in an infrarenal location under fluoroscopic visualization. The sheath was then removed and the ProGlides were tied and cut. There was insufficient hemostasis, therefore a 0 Prolene pursestring stitch was placed about the right groin access site. Hemostasis was achieved at the right neck and a pressure dressing was applied. The patient tolerated the procedure well and was transferred  to the ICU in good condition. IMPRESSION: 1. Inferior vena cavogram demonstrates indwelling inferior vena cava filter with possible adherent thrombus. 2. Technically successful inferior vena cava filter retrieval. 3. Successful bilateral pulmonary artery thrombectomy with removal of large volume acute appearing thrombus. 4. Technically successful inferior vena cava filter placement. Ester Sides, MD [facility] Radiology Electronically Signed   By: Ester Sides M.D.   On: 07/13/2024 08:49   IR US  Guide Vasc Access Right Result Date: 07/13/2024 INDICATION: [age]-year-old male with history of acute, high risk pulmonary embolism. EXAM: 1. Ultrasound-guided vascular access of the right common femoral vein. 2. Inferior vena cavogram. 3. Ultrasound-guided vascular access of the right internal jugular vein. 4. Inferior vena cava filter retrieval. 5. Pulmonary angiogram. 6. Central manometry. 7. Bilateral pulmonary artery aspiration thrombectomy. 8. Inferior vena cava filter placement. COMPARISON:  CTA chest from earlier the same day MEDICATIONS: 10,000 units heparin , intravenous ANESTHESIA/SEDATION: Moderate (conscious) sedation was employed during this procedure. A total of Versed  3 mg and Fentanyl  0 mcg was administered intravenously. Moderate Sedation Time: 97 minutes. The patient's level of consciousness and vital signs were monitored continuously by radiology nursing throughout the procedure under my direct supervision. FLUOROSCOPY TIME:  Nine hundred sixteen mGy reference air kerma COMPLICATIONS: None immediate. TECHNIQUE: Informed written consent was obtained from the patient after a thorough discussion of the procedural risks, benefits and alternatives. All questions were addressed. Maximal Sterile Barrier Technique was utilized including caps, mask, sterile gowns, sterile gloves, sterile drape, hand hygiene and skin antiseptic. A timeout was performed prior  to the initiation of the procedure. Preprocedure ultrasound evaluation demonstrated patency of the right common femoral vein. The procedure was planned. Subdermal Local anesthesia was administered 1% lidocaine . A small skin nick was made. Under direct ultrasound visualization, the right common femoral vein was accessed with a 21 gauge micropuncture needle. A permanent ultrasound image was captured and stored in the record. Micropuncture sheath was inserted followed by placement of a Wholey wire was directed to the inferior vena cava under fluoroscopic guidance. An 8 [place] vascular sheath was placed. A pigtail catheter was inserted to the peripheral inferior vena cava and inferior vena cavagram was performed. Inferior vena cavagram was significant for presence of an infrarenal IVC filter with possible nonocclusive thrombus about the apex of the filter with sluggish antegrade flow. Given failure of anticoagulation and indwelling IVC filter with presence of thrombus, decision was made to remove the indwelling filter. Therefore, the right internal jugular vein was accessed with a 21 gauge micropuncture needle. A permanent ultrasound image was captured and stored in the record. A micropuncture sheath was introduced through which a Wholey wire was advanced  to the peripheral inferior vena cava. Serial dilation was performed followed by introduction of an argon triple loop snare IVC filter retrieval coaxial kit. The snare was then used to capture the apex of the filter which was retrieved without complication. Repeat inferior vena cavagram was performed which demonstrated patency of the normal caliber inferior vena cava without evidence of filling defect or extravasation. Attention was then turned toward PE thrombectomy. In pre close fashion, 2 ProGlides at the 10 o'clock and 2 o'clock positions. Over the wire, a 24 [place] Inari sheath was then placed and directed to the inferior vena cava. Over the wire, a double angle  pigtail catheter was inserted and under fluoroscopic guidance was attempted to be directed through the right atrium and right ventricle to the main pulmonary artery, however due to the size of the right heart this was difficult. Therefore, a Swan-Ganz catheter was inserted with the balloon inflated which was floated to the main pulmonary artery. Central manometry was performed at this location with pulmonary arterial pressure of 70/38 with a mean of 45 mm Hg. The balloon was deflated and over a 0.025 guidewire the Swan-Ganz catheter was exchanged for a 4 French angled tip glide catheter. A Wholey wire was then inserted and directed into the left inferior pulmonary artery. The catheter was advanced to this location. The catheter was then exchanged for a 6 French angled tip select catheter. The wire was exchanged for a short taper superstiff Amplatz wire. The catheter was removed. A 24 [place] Flowtreiver aspiration catheter was then directed under fluoroscopic guidance to the main pulmonary artery. The inner dilator was removed. Pulmonary angiogram was then performed which demonstrated similar filling defects in the main and bilateral pulmonary arteries compatible with acute pulmonary embolism. The aspiration catheter was then advanced to the left pulmonary artery. Multiple aspirations were performed which yielded a large volume of acute appearing thrombus. Left pulmonary angiogram was then repeated which demonstrated significantly improved patency and perfusion of the left lung. There is a small volume additional thrombus in the proximal left inferior lobar pulmonary artery. Therefore, the T20 curved aspiration catheter was advanced in coaxial fashion and aspiration was performed which yielded additional small volume acute appearing thrombus. The coaxial system was then directed to the right pulmonary artery in the wire was inserted into the right inferior lobar pulmonary artery. Aspiration thrombectomy was then  performed which yielded large volume acute appearing thrombus. The large volume thrombus was affixed to the distal tip of the aspiration catheter and required removal of the coaxial aspiration device over the wire and Ree insertion. At this point, wire access to the right pulmonary artery was lost. Given significant improvement in vital signs and large volume thrombus, the pulmonary artery was not selected for completion measurements. At this point, the patient's tachycardia improved and oxygen saturations improved. The catheters were removed. Repeat inferior vena cavagram was then performed for planning purposes which demonstrated patency and again no evidence of extravasation. Over the wire, an option elite IVC filter was placed in an infrarenal location under fluoroscopic visualization. The sheath was then removed and the ProGlides were tied and cut. There was insufficient hemostasis, therefore a 0 Prolene pursestring stitch was placed about the right groin access site. Hemostasis was achieved at the right neck and a pressure dressing was applied. The patient tolerated the procedure well and was transferred to the ICU in good condition. IMPRESSION: 1. Inferior vena cavogram demonstrates indwelling inferior vena cava filter with possible adherent thrombus. 2. Technically  successful inferior vena cava filter retrieval. 3. Successful bilateral pulmonary artery thrombectomy with removal of large volume acute appearing thrombus. 4. Technically successful inferior vena cava filter placement. Ester Sides, MD [facility] Electronically Signed   By: Ester Sides M.D.   On: 07/13/2024 08:49   IR IVC FILTER PLMT / S&I PORTER GUID/MOD SED Result Date: 07/13/2024 INDICATION: [age]-year-old male with history of acute, high risk pulmonary embolism. EXAM: 1. Ultrasound-guided vascular access of the right common femoral vein. 2. Inferior vena cavogram. 3.  Ultrasound-guided vascular access of the right internal jugular vein. 4. Inferior vena cava filter retrieval. 5. Pulmonary angiogram. 6. Central manometry. 7. Bilateral pulmonary artery aspiration thrombectomy. 8. Inferior vena cava filter placement. COMPARISON:  CTA chest from earlier the same day MEDICATIONS: 10,000 units heparin , intravenous ANESTHESIA/SEDATION: Moderate (conscious) sedation was employed during this procedure. A total of Versed  3 mg and Fentanyl  0 mcg was administered intravenously. Moderate Sedation Time: 97 minutes. The patient's level of consciousness and vital signs were monitored continuously by radiology nursing throughout the procedure under my direct supervision. FLUOROSCOPY TIME:  Nine hundred sixteen mGy reference air kerma COMPLICATIONS: None immediate. TECHNIQUE: Informed written consent was obtained from the patient after a thorough discussion of the procedural risks, benefits and alternatives. All questions were addressed. Maximal Sterile Barrier Technique was utilized including caps, mask, sterile gowns, sterile gloves, sterile drape, hand hygiene and skin antiseptic. A timeout was performed prior to the initiation of the procedure. Preprocedure ultrasound evaluation demonstrated patency of the right common femoral vein. The procedure was planned. Subdermal Local anesthesia was administered 1% lidocaine . A small skin nick was made. Under direct ultrasound visualization, the right common femoral vein was accessed with a 21 gauge micropuncture needle. A permanent ultrasound image was captured and stored in the record. Micropuncture sheath was inserted followed by placement of a Wholey wire was directed to the inferior vena cava under fluoroscopic guidance. An 8 [place] vascular sheath was placed. A pigtail catheter was inserted to the peripheral inferior vena cava and inferior vena cavagram was performed. Inferior vena cavagram was significant for presence of an infrarenal IVC filter  with possible nonocclusive thrombus about the apex of the filter with sluggish antegrade flow. Given failure of anticoagulation and indwelling IVC filter with presence of thrombus, decision was made to remove the indwelling filter. Therefore, the right internal jugular vein was accessed with a 21 gauge micropuncture needle. A permanent ultrasound image was captured and stored in the record. A micropuncture sheath was introduced through which a Wholey wire was advanced to the peripheral inferior vena cava. Serial dilation was performed followed by introduction of an argon triple loop snare IVC filter retrieval coaxial kit. The snare was then used to capture the apex of the filter which was retrieved without complication. Repeat inferior vena cavagram was performed which demonstrated patency of the normal caliber inferior vena cava without evidence of filling defect or extravasation. Attention was then turned toward PE thrombectomy. In pre close fashion, 2 ProGlides at the 10 o'clock and 2 o'clock positions. Over the wire, a 24 [place] Inari sheath was then placed and directed to the inferior vena cava. Over the wire, a double angle pigtail catheter was inserted and under fluoroscopic guidance was attempted to be directed through the right atrium and right ventricle to the main pulmonary artery, however due to the size of the right heart this was difficult. Therefore, a Swan-Ganz catheter was inserted with the balloon inflated which was  floated to the main pulmonary artery. Central manometry was performed at this location with pulmonary arterial pressure of 70/38 with a mean of 45 mm Hg. The balloon was deflated and over a 0.025 guidewire the Swan-Ganz catheter was exchanged for a 4 French angled tip glide catheter. A Wholey wire was then inserted and directed into the left inferior pulmonary artery. The catheter was advanced to this location. The catheter was then exchanged for a 6 French angled tip select catheter.  The wire was exchanged for a short taper superstiff Amplatz wire. The catheter was removed. A 24 [place] Flowtreiver aspiration catheter was then directed under fluoroscopic guidance to the main pulmonary artery. The inner dilator was removed. Pulmonary angiogram was then performed which demonstrated similar filling defects in the main and bilateral pulmonary arteries compatible with acute pulmonary embolism. The aspiration catheter was then advanced to the left pulmonary artery. Multiple aspirations were performed which yielded a large volume of acute appearing thrombus. Left pulmonary angiogram was then repeated which demonstrated significantly improved patency and perfusion of the left lung. There is a small volume additional thrombus in the proximal left inferior lobar pulmonary artery. Therefore, the T20 curved aspiration catheter was advanced in coaxial fashion and aspiration was performed which yielded additional small volume acute appearing thrombus. The coaxial system was then directed to the right pulmonary artery in the wire was inserted into the right inferior lobar pulmonary artery. Aspiration thrombectomy was then performed which yielded large volume acute appearing thrombus. The large volume thrombus was affixed to the distal tip of the aspiration catheter and required removal of the coaxial aspiration device over the wire and Ree insertion. At this point, wire access to the right pulmonary artery was lost. Given significant improvement in vital signs and large volume thrombus, the pulmonary artery was not selected for completion measurements. At this point, the patient's tachycardia improved and oxygen saturations improved. The catheters were removed. Repeat inferior vena cavagram was then performed for planning purposes which demonstrated patency and again no evidence of extravasation. Over the wire, an option elite IVC filter was placed in an infrarenal location under fluoroscopic visualization. The  sheath was then removed and the ProGlides were tied and cut. There was insufficient hemostasis, therefore a 0 Prolene pursestring stitch was placed about the right groin access site. Hemostasis was achieved at the right neck and a pressure dressing was applied. The patient tolerated the procedure well and was transferred to the ICU in good condition. IMPRESSION: 1. Inferior vena cavogram demonstrates indwelling inferior vena cava filter with possible adherent thrombus. 2. Technically successful inferior vena cava filter retrieval. 3. Successful bilateral pulmonary artery thrombectomy with removal of large volume acute appearing thrombus. 4. Technically successful inferior vena cava filter placement. Ester Sides, MD [facility] Electronically Signed   By: Ester Sides M.D.   On: 07/13/2024 08:49   IR IVC Filter Retrieval / S&I /Img Guid/Mod Sed Result Date: 07/13/2024 INDICATION: [age]-year-old male with history of acute, high risk pulmonary embolism. EXAM: 1. Ultrasound-guided vascular access of the right common femoral vein. 2. Inferior vena cavogram. 3. Ultrasound-guided vascular access of the right internal jugular vein. 4. Inferior vena cava filter retrieval. 5. Pulmonary angiogram. 6. Central manometry. 7. Bilateral pulmonary artery aspiration thrombectomy. 8. Inferior vena cava filter placement. COMPARISON:  CTA chest from earlier the same day MEDICATIONS: 10,000 units heparin , intravenous ANESTHESIA/SEDATION: Moderate (conscious) sedation was employed during this procedure. A total of Versed  3 mg and Fentanyl  0  mcg was administered intravenously. Moderate Sedation Time: 97 minutes. The patient's level of consciousness and vital signs were monitored continuously by radiology nursing throughout the procedure under my direct supervision. FLUOROSCOPY TIME:  Nine hundred sixteen mGy reference air kerma COMPLICATIONS: None immediate. TECHNIQUE:  Informed written consent was obtained from the patient after a thorough discussion of the procedural risks, benefits and alternatives. All questions were addressed. Maximal Sterile Barrier Technique was utilized including caps, mask, sterile gowns, sterile gloves, sterile drape, hand hygiene and skin antiseptic. A timeout was performed prior to the initiation of the procedure. Preprocedure ultrasound evaluation demonstrated patency of the right common femoral vein. The procedure was planned. Subdermal Local anesthesia was administered 1% lidocaine . A small skin nick was made. Under direct ultrasound visualization, the right common femoral vein was accessed with a 21 gauge micropuncture needle. A permanent ultrasound image was captured and stored in the record. Micropuncture sheath was inserted followed by placement of a Wholey wire was directed to the inferior vena cava under fluoroscopic guidance. An 8 [place] vascular sheath was placed. A pigtail catheter was inserted to the peripheral inferior vena cava and inferior vena cavagram was performed. Inferior vena cavagram was significant for presence of an infrarenal IVC filter with possible nonocclusive thrombus about the apex of the filter with sluggish antegrade flow. Given failure of anticoagulation and indwelling IVC filter with presence of thrombus, decision was made to remove the indwelling filter. Therefore, the right internal jugular vein was accessed with a 21 gauge micropuncture needle. A permanent ultrasound image was captured and stored in the record. A micropuncture sheath was introduced through which a Wholey wire was advanced to the peripheral inferior vena cava. Serial dilation was performed followed by introduction of an argon triple loop snare IVC filter retrieval coaxial kit. The snare was then used to capture the apex of the filter which was retrieved without complication. Repeat inferior vena cavagram was performed which demonstrated patency of  the normal caliber inferior vena cava without evidence of filling defect or extravasation. Attention was then turned toward PE thrombectomy. In pre close fashion, 2 ProGlides at the 10 o'clock and 2 o'clock positions. Over the wire, a 24 [place] Inari sheath was then placed and directed to the inferior vena cava. Over the wire, a double angle pigtail catheter was inserted and under fluoroscopic guidance was attempted to be directed through the right atrium and right ventricle to the main pulmonary artery, however due to the size of the right heart this was difficult. Therefore, a Swan-Ganz catheter was inserted with the balloon inflated which was floated to the main pulmonary artery. Central manometry was performed at this location with pulmonary arterial pressure of 70/38 with a mean of 45 mm Hg. The balloon was deflated and over a 0.025 guidewire the Swan-Ganz catheter was exchanged for a 4 French angled tip glide catheter. A Wholey wire was then inserted and directed into the left inferior pulmonary artery. The catheter was advanced to this location. The catheter was then exchanged for a 6 French angled tip select catheter. The wire was exchanged for a short taper superstiff Amplatz wire. The catheter was removed. A 24 [place] Flowtreiver aspiration catheter was then directed under fluoroscopic guidance to the main pulmonary artery. The inner dilator was removed. Pulmonary angiogram was then performed which demonstrated similar filling defects in the main and bilateral pulmonary arteries compatible with acute pulmonary embolism. The aspiration catheter was then advanced to the left pulmonary artery. Multiple aspirations were performed which yielded  a large volume of acute appearing thrombus. Left pulmonary angiogram was then repeated which demonstrated significantly improved patency and perfusion of the left lung. There is a small volume additional thrombus in the proximal left inferior lobar pulmonary artery.  Therefore, the T20 curved aspiration catheter was advanced in coaxial fashion and aspiration was performed which yielded additional small volume acute appearing thrombus. The coaxial system was then directed to the right pulmonary artery in the wire was inserted into the right inferior lobar pulmonary artery. Aspiration thrombectomy was then performed which yielded large volume acute appearing thrombus. The large volume thrombus was affixed to the distal tip of the aspiration catheter and required removal of the coaxial aspiration device over the wire and Ree insertion. At this point, wire access to the right pulmonary artery was lost. Given significant improvement in vital signs and large volume thrombus, the pulmonary artery was not selected for completion measurements. At this point, the patient's tachycardia improved and oxygen saturations improved. The catheters were removed. Repeat inferior vena cavagram was then performed for planning purposes which demonstrated patency and again no evidence of extravasation. Over the wire, an option elite IVC filter was placed in an infrarenal location under fluoroscopic visualization. The sheath was then removed and the ProGlides were tied and cut. There was insufficient hemostasis, therefore a 0 Prolene pursestring stitch was placed about the right groin access site. Hemostasis was achieved at the right neck and a pressure dressing was applied. The patient tolerated the procedure well and was transferred to the ICU in good condition. IMPRESSION: 1. Inferior vena cavogram demonstrates indwelling inferior vena cava filter with possible adherent thrombus. 2. Technically successful inferior vena cava filter retrieval. 3. Successful bilateral pulmonary artery thrombectomy with removal of large volume acute appearing thrombus. 4. Technically successful inferior vena cava filter placement. Ester Sides, MD [facility]  Electronically Signed   By: Ester Sides M.D.   On: 07/13/2024 08:49   IR Venocavagram Ivc Result Date: 07/13/2024 INDICATION: [age]-year-old male with history of acute, high risk pulmonary embolism. EXAM: 1. Ultrasound-guided vascular access of the right common femoral vein. 2. Inferior vena cavogram. 3. Ultrasound-guided vascular access of the right internal jugular vein. 4. Inferior vena cava filter retrieval. 5. Pulmonary angiogram. 6. Central manometry. 7. Bilateral pulmonary artery aspiration thrombectomy. 8. Inferior vena cava filter placement. COMPARISON:  CTA chest from earlier the same day MEDICATIONS: 10,000 units heparin , intravenous ANESTHESIA/SEDATION: Moderate (conscious) sedation was employed during this procedure. A total of Versed  3 mg and Fentanyl  0 mcg was administered intravenously. Moderate Sedation Time: 97 minutes. The patient's level of consciousness and vital signs were monitored continuously by radiology nursing throughout the procedure under my direct supervision. FLUOROSCOPY TIME:  Nine hundred sixteen mGy reference air kerma COMPLICATIONS: None immediate. TECHNIQUE: Informed written consent was obtained from the patient after a thorough discussion of the procedural risks, benefits and alternatives. All questions were addressed. Maximal Sterile Barrier Technique was utilized including caps, mask, sterile gowns, sterile gloves, sterile drape, hand hygiene and skin antiseptic. A timeout was performed prior to the initiation of the procedure. Preprocedure ultrasound evaluation demonstrated patency of the right common femoral vein. The procedure was planned. Subdermal Local anesthesia was administered 1% lidocaine . A small skin nick was made. Under direct ultrasound visualization, the right common femoral vein was accessed with a 21 gauge micropuncture needle. A permanent ultrasound image was captured and stored in the record. Micropuncture sheath was inserted followed by placement  of a  Wholey wire was directed to the inferior vena cava under fluoroscopic guidance. An 8 [place] vascular sheath was placed. A pigtail catheter was inserted to the peripheral inferior vena cava and inferior vena cavagram was performed. Inferior vena cavagram was significant for presence of an infrarenal IVC filter with possible nonocclusive thrombus about the apex of the filter with sluggish antegrade flow. Given failure of anticoagulation and indwelling IVC filter with presence of thrombus, decision was made to remove the indwelling filter. Therefore, the right internal jugular vein was accessed with a 21 gauge micropuncture needle. A permanent ultrasound image was captured and stored in the record. A micropuncture sheath was introduced through which a Wholey wire was advanced to the peripheral inferior vena cava. Serial dilation was performed followed by introduction of an argon triple loop snare IVC filter retrieval coaxial kit. The snare was then used to capture the apex of the filter which was retrieved without complication. Repeat inferior vena cavagram was performed which demonstrated patency of the normal caliber inferior vena cava without evidence of filling defect or extravasation. Attention was then turned toward PE thrombectomy. In pre close fashion, 2 ProGlides at the 10 o'clock and 2 o'clock positions. Over the wire, a 24 [place] Inari sheath was then placed and directed to the inferior vena cava. Over the wire, a double angle pigtail catheter was inserted and under fluoroscopic guidance was attempted to be directed through the right atrium and right ventricle to the main pulmonary artery, however due to the size of the right heart this was difficult. Therefore, a Swan-Ganz catheter was inserted with the balloon inflated which was floated to the main pulmonary artery. Central manometry was performed at this location with pulmonary arterial pressure of 70/38 with a mean of 45 mm Hg. The balloon was  deflated and over a 0.025 guidewire the Swan-Ganz catheter was exchanged for a 4 French angled tip glide catheter. A Wholey wire was then inserted and directed into the left inferior pulmonary artery. The catheter was advanced to this location. The catheter was then exchanged for a 6 French angled tip select catheter. The wire was exchanged for a short taper superstiff Amplatz wire. The catheter was removed. A 24 [place] Flowtreiver aspiration catheter was then directed under fluoroscopic guidance to the main pulmonary artery. The inner dilator was removed. Pulmonary angiogram was then performed which demonstrated similar filling defects in the main and bilateral pulmonary arteries compatible with acute pulmonary embolism. The aspiration catheter was then advanced to the left pulmonary artery. Multiple aspirations were performed which yielded a large volume of acute appearing thrombus. Left pulmonary angiogram was then repeated which demonstrated significantly improved patency and perfusion of the left lung. There is a small volume additional thrombus in the proximal left inferior lobar pulmonary artery. Therefore, the T20 curved aspiration catheter was advanced in coaxial fashion and aspiration was performed which yielded additional small volume acute appearing thrombus. The coaxial system was then directed to the right pulmonary artery in the wire was inserted into the right inferior lobar pulmonary artery. Aspiration thrombectomy was then performed which yielded large volume acute appearing thrombus. The large volume thrombus was affixed to the distal tip of the aspiration catheter and required removal of the coaxial aspiration device over the wire and Ree insertion. At this point, wire access to the right pulmonary artery was lost. Given significant improvement in vital signs and large volume thrombus, the pulmonary artery was not selected for completion measurements. At this point, the patient's tachycardia  improved  and oxygen saturations improved. The catheters were removed. Repeat inferior vena cavagram was then performed for planning purposes which demonstrated patency and again no evidence of extravasation. Over the wire, an option elite IVC filter was placed in an infrarenal location under fluoroscopic visualization. The sheath was then removed and the ProGlides were tied and cut. There was insufficient hemostasis, therefore a 0 Prolene pursestring stitch was placed about the right groin access site. Hemostasis was achieved at the right neck and a pressure dressing was applied. The patient tolerated the procedure well and was transferred to the ICU in good condition. IMPRESSION: 1. Inferior vena cavogram demonstrates indwelling inferior vena cava filter with possible adherent thrombus. 2. Technically successful inferior vena cava filter retrieval. 3. Successful bilateral pulmonary artery thrombectomy with removal of large volume acute appearing thrombus. 4. Technically successful inferior vena cava filter placement. Ester Sides, MD [facility] Radiology Electronically Signed   By: Ester Sides M.D.   On: 07/13/2024 08:49   IR THROMBECT PRIM MECH ADD (INCLU) MOD SED Result Date: 07/13/2024 INDICATION: [age]-year-old male with history of acute, high risk pulmonary embolism. EXAM: 1. Ultrasound-guided vascular access of the right common femoral vein. 2. Inferior vena cavogram. 3. Ultrasound-guided vascular access of the right internal jugular vein. 4. Inferior vena cava filter retrieval. 5. Pulmonary angiogram. 6. Central manometry. 7. Bilateral pulmonary artery aspiration thrombectomy. 8. Inferior vena cava filter placement. COMPARISON:  CTA chest from earlier the same day MEDICATIONS: 10,000 units heparin , intravenous ANESTHESIA/SEDATION: Moderate (conscious) sedation was employed during this procedure. A total of Versed  3 mg and Fentanyl  0 mcg was  administered intravenously. Moderate Sedation Time: 97 minutes. The patient's level of consciousness and vital signs were monitored continuously by radiology nursing throughout the procedure under my direct supervision. FLUOROSCOPY TIME:  Nine hundred sixteen mGy reference air kerma COMPLICATIONS: None immediate. TECHNIQUE: Informed written consent was obtained from the patient after a thorough discussion of the procedural risks, benefits and alternatives. All questions were addressed. Maximal Sterile Barrier Technique was utilized including caps, mask, sterile gowns, sterile gloves, sterile drape, hand hygiene and skin antiseptic. A timeout was performed prior to the initiation of the procedure. Preprocedure ultrasound evaluation demonstrated patency of the right common femoral vein. The procedure was planned. Subdermal Local anesthesia was administered 1% lidocaine . A small skin nick was made. Under direct ultrasound visualization, the right common femoral vein was accessed with a 21 gauge micropuncture needle. A permanent ultrasound image was captured and stored in the record. Micropuncture sheath was inserted followed by placement of a Wholey wire was directed to the inferior vena cava under fluoroscopic guidance. An 8 [place] vascular sheath was placed. A pigtail catheter was inserted to the peripheral inferior vena cava and inferior vena cavagram was performed. Inferior vena cavagram was significant for presence of an infrarenal IVC filter with possible nonocclusive thrombus about the apex of the filter with sluggish antegrade flow. Given failure of anticoagulation and indwelling IVC filter with presence of thrombus, decision was made to remove the indwelling filter. Therefore, the right internal jugular vein was accessed with a 21 gauge micropuncture needle. A permanent ultrasound image was captured and stored in the record. A micropuncture sheath was introduced through which a Wholey wire was advanced to the  peripheral inferior vena cava. Serial dilation was performed followed by introduction of an argon triple loop snare IVC filter retrieval coaxial kit. The snare was then used to capture the apex of the filter which was retrieved without  complication. Repeat inferior vena cavagram was performed which demonstrated patency of the normal caliber inferior vena cava without evidence of filling defect or extravasation. Attention was then turned toward PE thrombectomy. In pre close fashion, 2 ProGlides at the 10 o'clock and 2 o'clock positions. Over the wire, a 24 [place] Inari sheath was then placed and directed to the inferior vena cava. Over the wire, a double angle pigtail catheter was inserted and under fluoroscopic guidance was attempted to be directed through the right atrium and right ventricle to the main pulmonary artery, however due to the size of the right heart this was difficult. Therefore, a Swan-Ganz catheter was inserted with the balloon inflated which was floated to the main pulmonary artery. Central manometry was performed at this location with pulmonary arterial pressure of 70/38 with a mean of 45 mm Hg. The balloon was deflated and over a 0.025 guidewire the Swan-Ganz catheter was exchanged for a 4 French angled tip glide catheter. A Wholey wire was then inserted and directed into the left inferior pulmonary artery. The catheter was advanced to this location. The catheter was then exchanged for a 6 French angled tip select catheter. The wire was exchanged for a short taper superstiff Amplatz wire. The catheter was removed. A 24 [place] Flowtreiver aspiration catheter was then directed under fluoroscopic guidance to the main pulmonary artery. The inner dilator was removed. Pulmonary angiogram was then performed which demonstrated similar filling defects in the main and bilateral pulmonary arteries compatible with acute pulmonary embolism. The aspiration catheter was then advanced to the left pulmonary  artery. Multiple aspirations were performed which yielded a large volume of acute appearing thrombus. Left pulmonary angiogram was then repeated which demonstrated significantly improved patency and perfusion of the left lung. There is a small volume additional thrombus in the proximal left inferior lobar pulmonary artery. Therefore, the T20 curved aspiration catheter was advanced in coaxial fashion and aspiration was performed which yielded additional small volume acute appearing thrombus. The coaxial system was then directed to the right pulmonary artery in the wire was inserted into the right inferior lobar pulmonary artery. Aspiration thrombectomy was then performed which yielded large volume acute appearing thrombus. The large volume thrombus was affixed to the distal tip of the aspiration catheter and required removal of the coaxial aspiration device over the wire and Ree insertion. At this point, wire access to the right pulmonary artery was lost. Given significant improvement in vital signs and large volume thrombus, the pulmonary artery was not selected for completion measurements. At this point, the patient's tachycardia improved and oxygen saturations improved. The catheters were removed. Repeat inferior vena cavagram was then performed for planning purposes which demonstrated patency and again no evidence of extravasation. Over the wire, an option elite IVC filter was placed in an infrarenal location under fluoroscopic visualization. The sheath was then removed and the ProGlides were tied and cut. There was insufficient hemostasis, therefore a 0 Prolene pursestring stitch was placed about the right groin access site. Hemostasis was achieved at the right neck and a pressure dressing was applied. The patient tolerated the procedure well and was transferred to the ICU in good condition. IMPRESSION: 1. Inferior vena cavogram demonstrates indwelling inferior vena cava filter with possible adherent thrombus.  2. Technically successful inferior vena cava filter retrieval. 3. Successful bilateral pulmonary artery thrombectomy with removal of large volume acute appearing thrombus. 4. Technically successful inferior vena cava filter placement. Ester Sides, MD [facility] Radiology Electronically Signed  By: Ester Sides M.D.   On: 07/13/2024 08:49   IR THROMBECT PRIM MECH ADD (INCLU) MOD SED Result Date: 07/13/2024 INDICATION: [age]-year-old male with history of acute, high risk pulmonary embolism. EXAM: 1. Ultrasound-guided vascular access of the right common femoral vein. 2. Inferior vena cavogram. 3. Ultrasound-guided vascular access of the right internal jugular vein. 4. Inferior vena cava filter retrieval. 5. Pulmonary angiogram. 6. Central manometry. 7. Bilateral pulmonary artery aspiration thrombectomy. 8. Inferior vena cava filter placement. COMPARISON:  CTA chest from earlier the same day MEDICATIONS: 10,000 units heparin , intravenous ANESTHESIA/SEDATION: Moderate (conscious) sedation was employed during this procedure. A total of Versed  3 mg and Fentanyl  0 mcg was administered intravenously. Moderate Sedation Time: 97 minutes. The patient's level of consciousness and vital signs were monitored continuously by radiology nursing throughout the procedure under my direct supervision. FLUOROSCOPY TIME:  Nine hundred sixteen mGy reference air kerma COMPLICATIONS: None immediate. TECHNIQUE: Informed written consent was obtained from the patient after a thorough discussion of the procedural risks, benefits and alternatives. All questions were addressed. Maximal Sterile Barrier Technique was utilized including caps, mask, sterile gowns, sterile gloves, sterile drape, hand hygiene and skin antiseptic. A timeout was performed prior to the initiation of the procedure. Preprocedure ultrasound evaluation demonstrated patency of the right common femoral vein. The  procedure was planned. Subdermal Local anesthesia was administered 1% lidocaine . A small skin nick was made. Under direct ultrasound visualization, the right common femoral vein was accessed with a 21 gauge micropuncture needle. A permanent ultrasound image was captured and stored in the record. Micropuncture sheath was inserted followed by placement of a Wholey wire was directed to the inferior vena cava under fluoroscopic guidance. An 8 [place] vascular sheath was placed. A pigtail catheter was inserted to the peripheral inferior vena cava and inferior vena cavagram was performed. Inferior vena cavagram was significant for presence of an infrarenal IVC filter with possible nonocclusive thrombus about the apex of the filter with sluggish antegrade flow. Given failure of anticoagulation and indwelling IVC filter with presence of thrombus, decision was made to remove the indwelling filter. Therefore, the right internal jugular vein was accessed with a 21 gauge micropuncture needle. A permanent ultrasound image was captured and stored in the record. A micropuncture sheath was introduced through which a Wholey wire was advanced to the peripheral inferior vena cava. Serial dilation was performed followed by introduction of an argon triple loop snare IVC filter retrieval coaxial kit. The snare was then used to capture the apex of the filter which was retrieved without complication. Repeat inferior vena cavagram was performed which demonstrated patency of the normal caliber inferior vena cava without evidence of filling defect or extravasation. Attention was then turned toward PE thrombectomy. In pre close fashion, 2 ProGlides at the 10 o'clock and 2 o'clock positions. Over the wire, a 24 [place] Inari sheath was then placed and directed to the inferior vena cava. Over the wire, a double angle pigtail catheter was inserted and under fluoroscopic guidance was attempted to be directed through the right atrium and right  ventricle to the main pulmonary artery, however due to the size of the right heart this was difficult. Therefore, a Swan-Ganz catheter was inserted with the balloon inflated which was floated to the main pulmonary artery. Central manometry was performed at this location with pulmonary arterial pressure of 70/38 with a mean of 45 mm Hg. The balloon was deflated and over a 0.025 guidewire the Swan-Ganz catheter was exchanged for a 4  French angled tip glide catheter. A Wholey wire was then inserted and directed into the left inferior pulmonary artery. The catheter was advanced to this location. The catheter was then exchanged for a 6 French angled tip select catheter. The wire was exchanged for a short taper superstiff Amplatz wire. The catheter was removed. A 24 [place] Flowtreiver aspiration catheter was then directed under fluoroscopic guidance to the main pulmonary artery. The inner dilator was removed. Pulmonary angiogram was then performed which demonstrated similar filling defects in the main and bilateral pulmonary arteries compatible with acute pulmonary embolism. The aspiration catheter was then advanced to the left pulmonary artery. Multiple aspirations were performed which yielded a large volume of acute appearing thrombus. Left pulmonary angiogram was then repeated which demonstrated significantly improved patency and perfusion of the left lung. There is a small volume additional thrombus in the proximal left inferior lobar pulmonary artery. Therefore, the T20 curved aspiration catheter was advanced in coaxial fashion and aspiration was performed which yielded additional small volume acute appearing thrombus. The coaxial system was then directed to the right pulmonary artery in the wire was inserted into the right inferior lobar pulmonary artery. Aspiration thrombectomy was then performed which yielded large volume acute appearing thrombus. The large volume thrombus was affixed to the distal tip of the  aspiration catheter and required removal of the coaxial aspiration device over the wire and Ree insertion. At this point, wire access to the right pulmonary artery was lost. Given significant improvement in vital signs and large volume thrombus, the pulmonary artery was not selected for completion measurements. At this point, the patient's tachycardia improved and oxygen saturations improved. The catheters were removed. Repeat inferior vena cavagram was then performed for planning purposes which demonstrated patency and again no evidence of extravasation. Over the wire, an option elite IVC filter was placed in an infrarenal location under fluoroscopic visualization. The sheath was then removed and the ProGlides were tied and cut. There was insufficient hemostasis, therefore a 0 Prolene pursestring stitch was placed about the right groin access site. Hemostasis was achieved at the right neck and a pressure dressing was applied. The patient tolerated the procedure well and was transferred to the ICU in good condition. IMPRESSION: 1. Inferior vena cavogram demonstrates indwelling inferior vena cava filter with possible adherent thrombus. 2. Technically successful inferior vena cava filter retrieval. 3. Successful bilateral pulmonary artery thrombectomy with removal of large volume acute appearing thrombus. 4. Technically successful inferior vena cava filter placement. Ester Sides, MD [facility] Radiology Electronically Signed   By: Ester Sides M.D.   On: 07/13/2024 08:49   IR Angiogram Pulmonary Bilateral Selective Result Date: 07/13/2024 INDICATION: [age]-year-old male with history of acute, high risk pulmonary embolism. EXAM: 1. Ultrasound-guided vascular access of the right common femoral vein. 2. Inferior vena cavogram. 3. Ultrasound-guided vascular access of the right internal jugular vein. 4. Inferior vena cava filter retrieval. 5. Pulmonary angiogram.  6. Central manometry. 7. Bilateral pulmonary artery aspiration thrombectomy. 8. Inferior vena cava filter placement. COMPARISON:  CTA chest from earlier the same day MEDICATIONS: 10,000 units heparin , intravenous ANESTHESIA/SEDATION: Moderate (conscious) sedation was employed during this procedure. A total of Versed  3 mg and Fentanyl  0 mcg was administered intravenously. Moderate Sedation Time: 97 minutes. The patient's level of consciousness and vital signs were monitored continuously by radiology nursing throughout the procedure under my direct supervision. FLUOROSCOPY TIME:  Nine hundred sixteen mGy reference air kerma COMPLICATIONS: None immediate. TECHNIQUE: Informed written consent  was obtained from the patient after a thorough discussion of the procedural risks, benefits and alternatives. All questions were addressed. Maximal Sterile Barrier Technique was utilized including caps, mask, sterile gowns, sterile gloves, sterile drape, hand hygiene and skin antiseptic. A timeout was performed prior to the initiation of the procedure. Preprocedure ultrasound evaluation demonstrated patency of the right common femoral vein. The procedure was planned. Subdermal Local anesthesia was administered 1% lidocaine . A small skin nick was made. Under direct ultrasound visualization, the right common femoral vein was accessed with a 21 gauge micropuncture needle. A permanent ultrasound image was captured and stored in the record. Micropuncture sheath was inserted followed by placement of a Wholey wire was directed to the inferior vena cava under fluoroscopic guidance. An 8 [place] vascular sheath was placed. A pigtail catheter was inserted to the peripheral inferior vena cava and inferior vena cavagram was performed. Inferior vena cavagram was significant for presence of an infrarenal IVC filter with possible nonocclusive thrombus about the apex of the filter with sluggish antegrade flow. Given failure of anticoagulation and  indwelling IVC filter with presence of thrombus, decision was made to remove the indwelling filter. Therefore, the right internal jugular vein was accessed with a 21 gauge micropuncture needle. A permanent ultrasound image was captured and stored in the record. A micropuncture sheath was introduced through which a Wholey wire was advanced to the peripheral inferior vena cava. Serial dilation was performed followed by introduction of an argon triple loop snare IVC filter retrieval coaxial kit. The snare was then used to capture the apex of the filter which was retrieved without complication. Repeat inferior vena cavagram was performed which demonstrated patency of the normal caliber inferior vena cava without evidence of filling defect or extravasation. Attention was then turned toward PE thrombectomy. In pre close fashion, 2 ProGlides at the 10 o'clock and 2 o'clock positions. Over the wire, a 24 [place] Inari sheath was then placed and directed to the inferior vena cava. Over the wire, a double angle pigtail catheter was inserted and under fluoroscopic guidance was attempted to be directed through the right atrium and right ventricle to the main pulmonary artery, however due to the size of the right heart this was difficult. Therefore, a Swan-Ganz catheter was inserted with the balloon inflated which was floated to the main pulmonary artery. Central manometry was performed at this location with pulmonary arterial pressure of 70/38 with a mean of 45 mm Hg. The balloon was deflated and over a 0.025 guidewire the Swan-Ganz catheter was exchanged for a 4 French angled tip glide catheter. A Wholey wire was then inserted and directed into the left inferior pulmonary artery. The catheter was advanced to this location. The catheter was then exchanged for a 6 French angled tip select catheter. The wire was exchanged for a short taper superstiff Amplatz wire. The catheter was removed. A 24 [place] Flowtreiver aspiration  catheter was then directed under fluoroscopic guidance to the main pulmonary artery. The inner dilator was removed. Pulmonary angiogram was then performed which demonstrated similar filling defects in the main and bilateral pulmonary arteries compatible with acute pulmonary embolism. The aspiration catheter was then advanced to the left pulmonary artery. Multiple aspirations were performed which yielded a large volume of acute appearing thrombus. Left pulmonary angiogram was then repeated which demonstrated significantly improved patency and perfusion of the left lung. There is a small volume additional thrombus in the proximal left inferior lobar pulmonary artery. Therefore, the T20 curved aspiration catheter was advanced  in coaxial fashion and aspiration was performed which yielded additional small volume acute appearing thrombus. The coaxial system was then directed to the right pulmonary artery in the wire was inserted into the right inferior lobar pulmonary artery. Aspiration thrombectomy was then performed which yielded large volume acute appearing thrombus. The large volume thrombus was affixed to the distal tip of the aspiration catheter and required removal of the coaxial aspiration device over the wire and Ree insertion. At this point, wire access to the right pulmonary artery was lost. Given significant improvement in vital signs and large volume thrombus, the pulmonary artery was not selected for completion measurements. At this point, the patient's tachycardia improved and oxygen saturations improved. The catheters were removed. Repeat inferior vena cavagram was then performed for planning purposes which demonstrated patency and again no evidence of extravasation. Over the wire, an option elite IVC filter was placed in an infrarenal location under fluoroscopic visualization. The sheath was then removed and the ProGlides were tied and cut. There was insufficient hemostasis, therefore a 0 Prolene  pursestring stitch was placed about the right groin access site. Hemostasis was achieved at the right neck and a pressure dressing was applied. The patient tolerated the procedure well and was transferred to the ICU in good condition. IMPRESSION: 1. Inferior vena cavogram demonstrates indwelling inferior vena cava filter with possible adherent thrombus. 2. Technically successful inferior vena cava filter retrieval. 3. Successful bilateral pulmonary artery thrombectomy with removal of large volume acute appearing thrombus. 4. Technically successful inferior vena cava filter placement. Ester Sides, MD [facility] Radiology Electronically Signed   By: Ester Sides M.D.   On: 07/13/2024 08:49   IR Angiogram Selective Each Additional Vessel Result Date: 07/13/2024 INDICATION: [age]-year-old male with history of acute, high risk pulmonary embolism. EXAM: 1. Ultrasound-guided vascular access of the right common femoral vein. 2. Inferior vena cavogram. 3. Ultrasound-guided vascular access of the right internal jugular vein. 4. Inferior vena cava filter retrieval. 5. Pulmonary angiogram. 6. Central manometry. 7. Bilateral pulmonary artery aspiration thrombectomy. 8. Inferior vena cava filter placement. COMPARISON:  CTA chest from earlier the same day MEDICATIONS: 10,000 units heparin , intravenous ANESTHESIA/SEDATION: Moderate (conscious) sedation was employed during this procedure. A total of Versed  3 mg and Fentanyl  0 mcg was administered intravenously. Moderate Sedation Time: 97 minutes. The patient's level of consciousness and vital signs were monitored continuously by radiology nursing throughout the procedure under my direct supervision. FLUOROSCOPY TIME:  Nine hundred sixteen mGy reference air kerma COMPLICATIONS: None immediate. TECHNIQUE: Informed written consent was obtained from the patient after a thorough discussion of the procedural risks, benefits and  alternatives. All questions were addressed. Maximal Sterile Barrier Technique was utilized including caps, mask, sterile gowns, sterile gloves, sterile drape, hand hygiene and skin antiseptic. A timeout was performed prior to the initiation of the procedure. Preprocedure ultrasound evaluation demonstrated patency of the right common femoral vein. The procedure was planned. Subdermal Local anesthesia was administered 1% lidocaine . A small skin nick was made. Under direct ultrasound visualization, the right common femoral vein was accessed with a 21 gauge micropuncture needle. A permanent ultrasound image was captured and stored in the record. Micropuncture sheath was inserted followed by placement of a Wholey wire was directed to the inferior vena cava under fluoroscopic guidance. An 8 [place] vascular sheath was placed. A pigtail catheter was inserted to the peripheral inferior vena cava and inferior vena cavagram was performed. Inferior vena cavagram was significant for presence of an  infrarenal IVC filter with possible nonocclusive thrombus about the apex of the filter with sluggish antegrade flow. Given failure of anticoagulation and indwelling IVC filter with presence of thrombus, decision was made to remove the indwelling filter. Therefore, the right internal jugular vein was accessed with a 21 gauge micropuncture needle. A permanent ultrasound image was captured and stored in the record. A micropuncture sheath was introduced through which a Wholey wire was advanced to the peripheral inferior vena cava. Serial dilation was performed followed by introduction of an argon triple loop snare IVC filter retrieval coaxial kit. The snare was then used to capture the apex of the filter which was retrieved without complication. Repeat inferior vena cavagram was performed which demonstrated patency of the normal caliber inferior vena cava without evidence of filling defect or extravasation. Attention was then turned toward  PE thrombectomy. In pre close fashion, 2 ProGlides at the 10 o'clock and 2 o'clock positions. Over the wire, a 24 [place] Inari sheath was then placed and directed to the inferior vena cava. Over the wire, a double angle pigtail catheter was inserted and under fluoroscopic guidance was attempted to be directed through the right atrium and right ventricle to the main pulmonary artery, however due to the size of the right heart this was difficult. Therefore, a Swan-Ganz catheter was inserted with the balloon inflated which was floated to the main pulmonary artery. Central manometry was performed at this location with pulmonary arterial pressure of 70/38 with a mean of 45 mm Hg. The balloon was deflated and over a 0.025 guidewire the Swan-Ganz catheter was exchanged for a 4 French angled tip glide catheter. A Wholey wire was then inserted and directed into the left inferior pulmonary artery. The catheter was advanced to this location. The catheter was then exchanged for a 6 French angled tip select catheter. The wire was exchanged for a short taper superstiff Amplatz wire. The catheter was removed. A 24 [place] Flowtreiver aspiration catheter was then directed under fluoroscopic guidance to the main pulmonary artery. The inner dilator was removed. Pulmonary angiogram was then performed which demonstrated similar filling defects in the main and bilateral pulmonary arteries compatible with acute pulmonary embolism. The aspiration catheter was then advanced to the left pulmonary artery. Multiple aspirations were performed which yielded a large volume of acute appearing thrombus. Left pulmonary angiogram was then repeated which demonstrated significantly improved patency and perfusion of the left lung. There is a small volume additional thrombus in the proximal left inferior lobar pulmonary artery. Therefore, the T20 curved aspiration catheter was advanced in coaxial fashion and aspiration was performed which yielded  additional small volume acute appearing thrombus. The coaxial system was then directed to the right pulmonary artery in the wire was inserted into the right inferior lobar pulmonary artery. Aspiration thrombectomy was then performed which yielded large volume acute appearing thrombus. The large volume thrombus was affixed to the distal tip of the aspiration catheter and required removal of the coaxial aspiration device over the wire and Ree insertion. At this point, wire access to the right pulmonary artery was lost. Given significant improvement in vital signs and large volume thrombus, the pulmonary artery was not selected for completion measurements. At this point, the patient's tachycardia improved and oxygen saturations improved. The catheters were removed. Repeat inferior vena cavagram was then performed for planning purposes which demonstrated patency and again no evidence of extravasation. Over the wire, an option elite IVC filter was placed in an infrarenal location under fluoroscopic visualization. The  sheath was then removed and the ProGlides were tied and cut. There was insufficient hemostasis, therefore a 0 Prolene pursestring stitch was placed about the right groin access site. Hemostasis was achieved at the right neck and a pressure dressing was applied. The patient tolerated the procedure well and was transferred to the ICU in good condition. IMPRESSION: 1. Inferior vena cavogram demonstrates indwelling inferior vena cava filter with possible adherent thrombus. 2. Technically successful inferior vena cava filter retrieval. 3. Successful bilateral pulmonary artery thrombectomy with removal of large volume acute appearing thrombus. 4. Technically successful inferior vena cava filter placement. Ester Sides, MD [facility] Radiology Electronically Signed   By: Ester Sides M.D.   On: 07/13/2024 08:49   IR Angiogram Selective Each Additional  Vessel Result Date: 07/13/2024 INDICATION: [age]-year-old male with history of acute, high risk pulmonary embolism. EXAM: 1. Ultrasound-guided vascular access of the right common femoral vein. 2. Inferior vena cavogram. 3. Ultrasound-guided vascular access of the right internal jugular vein. 4. Inferior vena cava filter retrieval. 5. Pulmonary angiogram. 6. Central manometry. 7. Bilateral pulmonary artery aspiration thrombectomy. 8. Inferior vena cava filter placement. COMPARISON:  CTA chest from earlier the same day MEDICATIONS: 10,000 units heparin , intravenous ANESTHESIA/SEDATION: Moderate (conscious) sedation was employed during this procedure. A total of Versed  3 mg and Fentanyl  0 mcg was administered intravenously. Moderate Sedation Time: 97 minutes. The patient's level of consciousness and vital signs were monitored continuously by radiology nursing throughout the procedure under my direct supervision. FLUOROSCOPY TIME:  Nine hundred sixteen mGy reference air kerma COMPLICATIONS: None immediate. TECHNIQUE: Informed written consent was obtained from the patient after a thorough discussion of the procedural risks, benefits and alternatives. All questions were addressed. Maximal Sterile Barrier Technique was utilized including caps, mask, sterile gowns, sterile gloves, sterile drape, hand hygiene and skin antiseptic. A timeout was performed prior to the initiation of the procedure. Preprocedure ultrasound evaluation demonstrated patency of the right common femoral vein. The procedure was planned. Subdermal Local anesthesia was administered 1% lidocaine . A small skin nick was made. Under direct ultrasound visualization, the right common femoral vein was accessed with a 21 gauge micropuncture needle. A permanent ultrasound image was captured and stored in the record. Micropuncture sheath was inserted followed by placement of a Wholey wire was directed to the inferior vena cava under fluoroscopic guidance. An  8 [place] vascular sheath was placed. A pigtail catheter was inserted to the peripheral inferior vena cava and inferior vena cavagram was performed. Inferior vena cavagram was significant for presence of an infrarenal IVC filter with possible nonocclusive thrombus about the apex of the filter with sluggish antegrade flow. Given failure of anticoagulation and indwelling IVC filter with presence of thrombus, decision was made to remove the indwelling filter. Therefore, the right internal jugular vein was accessed with a 21 gauge micropuncture needle. A permanent ultrasound image was captured and stored in the record. A micropuncture sheath was introduced through which a Wholey wire was advanced to the peripheral inferior vena cava. Serial dilation was performed followed by introduction of an argon triple loop snare IVC filter retrieval coaxial kit. The snare was then used to capture the apex of the filter which was retrieved without complication. Repeat inferior vena cavagram was performed which demonstrated patency of the normal caliber inferior vena cava without evidence of filling defect or extravasation. Attention was then turned toward PE thrombectomy. In pre close fashion, 2 ProGlides at the 10 o'clock and 2 o'clock positions. Over the  wire, a 24 [place] Inari sheath was then placed and directed to the inferior vena cava. Over the wire, a double angle pigtail catheter was inserted and under fluoroscopic guidance was attempted to be directed through the right atrium and right ventricle to the main pulmonary artery, however due to the size of the right heart this was difficult. Therefore, a Swan-Ganz catheter was inserted with the balloon inflated which was floated to the main pulmonary artery. Central manometry was performed at this location with pulmonary arterial pressure of 70/38 with a mean of 45 mm Hg. The balloon was deflated and over a 0.025 guidewire the Swan-Ganz catheter was exchanged for a 4 French  angled tip glide catheter. A Wholey wire was then inserted and directed into the left inferior pulmonary artery. The catheter was advanced to this location. The catheter was then exchanged for a 6 French angled tip select catheter. The wire was exchanged for a short taper superstiff Amplatz wire. The catheter was removed. A 24 [place] Flowtreiver aspiration catheter was then directed under fluoroscopic guidance to the main pulmonary artery. The inner dilator was removed. Pulmonary angiogram was then performed which demonstrated similar filling defects in the main and bilateral pulmonary arteries compatible with acute pulmonary embolism. The aspiration catheter was then advanced to the left pulmonary artery. Multiple aspirations were performed which yielded a large volume of acute appearing thrombus. Left pulmonary angiogram was then repeated which demonstrated significantly improved patency and perfusion of the left lung. There is a small volume additional thrombus in the proximal left inferior lobar pulmonary artery. Therefore, the T20 curved aspiration catheter was advanced in coaxial fashion and aspiration was performed which yielded additional small volume acute appearing thrombus. The coaxial system was then directed to the right pulmonary artery in the wire was inserted into the right inferior lobar pulmonary artery. Aspiration thrombectomy was then performed which yielded large volume acute appearing thrombus. The large volume thrombus was affixed to the distal tip of the aspiration catheter and required removal of the coaxial aspiration device over the wire and Ree insertion. At this point, wire access to the right pulmonary artery was lost. Given significant improvement in vital signs and large volume thrombus, the pulmonary artery was not selected for completion measurements. At this point, the patient's tachycardia improved and oxygen saturations improved. The catheters were removed. Repeat inferior vena  cavagram was then performed for planning purposes which demonstrated patency and again no evidence of extravasation. Over the wire, an option elite IVC filter was placed in an infrarenal location under fluoroscopic visualization. The sheath was then removed and the ProGlides were tied and cut. There was insufficient hemostasis, therefore a 0 Prolene pursestring stitch was placed about the right groin access site. Hemostasis was achieved at the right neck and a pressure dressing was applied. The patient tolerated the procedure well and was transferred to the ICU in good condition. IMPRESSION: 1. Inferior vena cavogram demonstrates indwelling inferior vena cava filter with possible adherent thrombus. 2. Technically successful inferior vena cava filter retrieval. 3. Successful bilateral pulmonary artery thrombectomy with removal of large volume acute appearing thrombus. 4. Technically successful inferior vena cava filter placement. Ester Sides, MD [facility] Radiology Electronically Signed   By: Ester Sides M.D.   On: 07/13/2024 08:49   CT CERVICAL SPINE WO CONTRAST Result Date: 07/12/2024 CLINICAL DATA:  Blunt poly trauma. EXAM: CT CERVICAL SPINE WITHOUT CONTRAST TECHNIQUE: Multidetector CT imaging of the cervical spine was performed without intravenous contrast.  Multiplanar CT image reconstructions were also generated. RADIATION DOSE REDUCTION: This exam was performed according to the departmental dose-optimization program which includes automated exposure control, adjustment of the mA and/or kV according to patient size and/or use of iterative reconstruction technique. COMPARISON:  None Available. FINDINGS: Alignment: Straightening of normal lordosis. No traumatic subluxation. Skull base and vertebrae: No acute fracture. Vertebral body heights are maintained. The dens and skull base are intact. Soft tissues and spinal canal: No prevertebral fluid or swelling. No  visible canal hematoma. Disc levels: Anterior and posterior spurring extend from C4-C5 through a C7-T1. Mild multilevel facet hypertrophy. Spinal canal narrowing at C4-C5 and C6-C7. Upper chest: Assessed on concurrent chest CT, reported separately. Other: None. IMPRESSION: Degenerative change in the cervical spine without acute fracture or subluxation. Electronically Signed   By: Andrea Gasman M.D.   On: 07/12/2024 18:48   CT Angio Chest/Abd/Pel for Dissection W and/or Wo Contrast Result Date: 07/12/2024 CLINICAL DATA:  Provided history: Acute aortic syndrome (AAS) suspected Found unresponsive on floor.  Hypotensive. EXAM: CT ANGIOGRAPHY CHEST, ABDOMEN AND PELVIS TECHNIQUE: Non-contrast CT of the chest was initially obtained. Multidetector CT imaging through the chest, abdomen and pelvis was performed using the standard protocol during bolus administration of intravenous contrast. Multiplanar reconstructed images and MIPs were obtained and reviewed to evaluate the vascular anatomy. RADIATION DOSE REDUCTION: This exam was performed according to the departmental dose-optimization program which includes automated exposure control, adjustment of the mA and/or kV according to patient size and/or use of iterative reconstruction technique. CONTRAST:  OMNIPAQUE  IOHEXOL  350 MG/ML SOLN COMPARISON:  Chest CTA 07/07/2024, abdominopelvic venogram 05/21/2021 FINDINGS: CTA CHEST FINDINGS Cardiovascular: No aortic hematoma on noncontrast exam. No dissection or evidence of acute aortic injury. Mild aortic atherosclerosis. Acute bilateral pulmonary emboli that are new from prior exam. Small saddle embolus is new, with large clot burden in the distal main pulmonary arteries extending into many lobar and segmental branches. 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 There is right heart strain with RV to LV ratio of 2. The heart is upper normal in size.  There is no pericardial effusion. Mediastinum/Nodes: No mediastinal hemorrhage or hematoma. Small mediastinal lymph nodes are not enlarged by size criteria. Decompressed esophagus. No pneumomediastinum. No visible thyroid  nodule. Lungs/Pleura: No pneumothorax or pulmonary contusion. Mild heterogeneous pulmonary parenchyma. Scattered areas of scarring and subpleural reticulation, unchanged from recent exam. No evidence of pulmonary infarct or focal airspace disease. No pleural fluid. Trachea and central airways are clear. Musculoskeletal: No acute fracture of the ribs, sternum, included clavicles or shoulder girdles. Diffuse degenerative change in the thoracic spine without thoracic spine fracture. Partial fusion of the posterior elements of T12-L1. No confluent body wall contusion. Review of the MIP images confirms the above findings. CTA ABDOMEN AND PELVIS FINDINGS VASCULAR Aorta: No aortic aneurysm or dissection. There is periaortic and retroperitoneal stranding at the L2 level, also at the level of IVC filter, but no discrete aortic injury. No aortic irregularity, aortic contours are smooth. Mild aortic atherosclerosis. Celiac: Patent without evidence of aneurysm, dissection, vasculitis or significant stenosis. SMA: Patent without evidence of aneurysm, dissection, vasculitis or significant stenosis. Renals: Both renal arteries are patent without evidence of aneurysm, dissection, vasculitis, fibromuscular dysplasia or significant stenosis. IMA: Patent without evidence of aneurysm, dissection, vasculitis or significant stenosis. Inflow: Patent without evidence of aneurysm, dissection, vasculitis or significant stenosis. Veins: The venous structures are not well assessed on this arterial  phase exam. IVC filter in place. There is retroperitoneal stranding and edema at the level of the filter. Review of the MIP images confirms the above findings. NON-VASCULAR Hepatobiliary: Arterial phase imaging limits detailed  assessment. The liver is enlarged with steatosis. There is no evidence of perihepatic hematoma or focal liver abnormality. No evidence of liver injury. Unremarkable gallbladder. Pancreas: No evidence of pancreatic injury. No ductal dilatation or inflammation. Spleen: No perisplenic hematoma.  Normal arterial phase enhancement. Adrenals/Urinary Tract: No adrenal hemorrhage. No evidence of renal injury. No hydronephrosis. Nondistended urinary bladder. Stomach/Bowel: No evidence of bowel injury or inflammation. No bowel wall thickening. Small to moderate colonic stool burden. Occasional colonic diverticula without diverticulitis. Lymphatic: No adenopathy. Reproductive: Prostate is unremarkable. Other: Retroperitoneal stranding adjacent to the aorta and IVC at the L2 level at the level of IVC filter. There is no other free fluid. No free air. Moderate fat containing umbilical hernia. No confluent body wall contusion. Musculoskeletal: No acute fracture of the pelvis or lumbar spine. Diffuse lumbar degenerative change. Review of the MIP images confirms the above findings. IMPRESSION: 1. Acute bilateral pulmonary emboli with large clot burden and right heart strain, (RV/LV Ratio = 2) consistent with at least submassive (intermediate risk) PE. The presence of right heart strain has been associated with an increased risk of morbidity and mortality. Please refer to the Code PE Focused order set in EPIC. 2. Retroperitoneal stranding adjacent to the abdominal aorta and IVC, at the level of IVC filter. There is no evidence of discrete abdominal aortic injury, however stranding etiology is indeterminate in etiology. The IVC is not well assessed on this venous phase exam. Consider a follow-up exam including venous phase imaging. 3. IVC filter in place. 4. No other evidence of acute traumatic injury to the chest, abdomen, or pelvis. 5. Hepatomegaly and hepatic steatosis. Aortic Atherosclerosis (ICD10-I70.0). Critical  Value/emergent results were called by telephone at the time of interpretation on 07/12/2024 at 6:27 pm to Dr Lyndel, who verbally acknowledged these results. Electronically Signed   By: Andrea Gasman M.D.   On: 07/12/2024 18:46   CT HEAD WO CONTRAST Result Date: 07/12/2024 CLINICAL DATA:  Head trauma, found on floor with hematoma to back of head. Hypotensive. EXAM: CT HEAD WITHOUT CONTRAST TECHNIQUE: Contiguous axial images were obtained from the base of the skull through the vertex without intravenous contrast. RADIATION DOSE REDUCTION: This exam was performed according to the departmental dose-optimization program which includes automated exposure control, adjustment of the mA and/or kV according to patient size and/or use of iterative reconstruction technique. COMPARISON:  None Available. FINDINGS: Brain: No intracranial hemorrhage, mass effect, or midline shift. No hydrocephalus. The basilar cisterns are patent. No evidence of territorial infarct or acute ischemia. No extra-axial or intracranial fluid collection. Vascular: Atherosclerosis of skullbase vasculature without hyperdense vessel or abnormal calcification. Skull: No fracture or focal lesion. Sinuses/Orbits: No acute fracture or acute finding. Other: Mild right parietal scalp contusion. IMPRESSION: Mild right parietal scalp contusion. No acute intracranial abnormality. No skull fracture. Electronically Signed   By: Andrea Gasman M.D.   On: 07/12/2024 18:34    Labs:  CBC: Recent Labs    07/07/24 1024 07/12/24 1803 07/12/24 1809 07/13/24 0105  WBC 4.3 5.2  --  4.0  HGB 15.0 14.4 16.0 13.8  HCT 47.2 46.5 47.0 43.9  PLT 79* 67*  --  54*    COAGS: Recent Labs    07/07/24 1035 07/12/24 1803  INR 1.2 1.3*    BMP: Recent  Labs    07/07/24 1024 07/12/24 1803 07/12/24 1809 07/13/24 0105  NA 138 138 141 140  K 4.7 4.6 4.0 5.5*  CL 112* 109 113* 112*  CO2 15* 16*  --  19*  GLUCOSE 149* 241* 238* 208*  BUN 24* 32* 32*  31*  CALCIUM  9.0 8.8*  --  8.5*  CREATININE 1.06 1.76* 1.70* 1.38*  GFRNONAA >60 45*  --  60*    LIVER FUNCTION TESTS: Recent Labs    07/12/24 1803  BILITOT 1.0  AST 360*  ALT 311*  ALKPHOS 85  PROT 6.5  ALBUMIN 3.3*    Assessment and Plan: Acute pulmonary embolus s/p mechanical thrombectomy 07/12/24 by Dr. Jennefer Patient with subjective improvement in his breathing and WOB after thrombectomy. Now on room air after non-rebreather use yesterday.  Remains in heparin  gtt today.  Small amount of ongoing sanguinous vs. Serosanguinous drainage on his right groin dressing.  Replaced dressing and will leave suture for additional 24 hrs.  Patient may stand to pivot to commode, but otherwise bed rest advised until suture removed tomorrow.    Electronically Signed: Mackay Hanauer Sue-Ellen Ashea Winiarski, PA 07/13/2024, 3:56 PM   I spent a total of 15 Minutes at the the patient's bedside AND on the patient's hospital floor or unit, greater than 50% of which was counseling/coordinating care for acute pulmonary embolus.

## 2024-07-13 NOTE — TOC Initial Note (Signed)
 Transition of Care [facility]) - Initial/Assessment Note    Patient Details  Name: Travis Rollins MRN: [id number] Date of Birth: 1966-05-06  Transition of Care [facility]) CM/SW Contact:    Sudie Erminio Deems, RN Phone Number: 07/13/2024, 12:46 PM  Clinical Narrative: Patient presented post  syncopal episode, shortness of breath, and saddle PE. PTA patient reports that he was from home with his mother and brothers. Patient has DME cane in the home. Patient states PCP is [facility] and the clinic provides transportation to appointments. Patient states he gets his medications delivered from [facility]. Case Manager will continue to follow for disposition needs as the patient progresses.                    Expected Discharge Plan: Home/Self Care Barriers to Discharge: Continued Medical Work up   Patient Goals and CMS Choice Patient states their goals for this hospitalization and ongoing recovery are:: plans to return home once stable   Choice offered to / list presented to : NA      Expected Discharge Plan and Services In-house Referral: NA Discharge Planning Services: CM Consult Post Acute Care Choice: NA Living arrangements for the past 2 months: Apartment                   DME Agency: NA   Prior Living Arrangements/Services Living arrangements for the past 2 months: Apartment Lives with:: Parents, Siblings Patient language and need for interpreter reviewed:: Yes Do you feel safe going back to the place where you live?: Yes      Need for Family Participation in Patient Care: Yes (Comment) Care giver support system in place?: Yes (comment) Current home services: DME (cane) Criminal Activity/Legal Involvement Pertinent to Current Situation/Hospitalization: No - Comment as needed  Permission Sought/Granted Permission sought to share information with : Family Supports, Case Manager   Emotional Assessment Appearance:: Appears stated  age Attitude/Demeanor/Rapport: Engaged Affect (typically observed): Appropriate Orientation: : Oriented to Self, Oriented to Place Alcohol / Substance Use: Not Applicable Psych Involvement: No (comment)  Admission diagnosis:  Thrombocytopenia (HCC) [D69.6] Transaminitis [R74.01] Acute pulmonary embolism (HCC) [I26.99] AKI (acute kidney injury) (HCC) [N17.9] Syncope, unspecified syncope type [R55] Acute saddle pulmonary embolism with acute cor pulmonale (HCC) [I26.02] Patient Active Problem List   Diagnosis Date Noted   Acute pulmonary embolism (HCC) 07/12/2024   Chondromalacia patellae, right knee 12/14/2023   Leukocytopenia 08/05/2021   Type 2 diabetes mellitus without complication, with long-term current use of insulin  (HCC)    Bilateral pulmonary embolism (HCC) 05/20/2021   Coronary artery disease involving native coronary artery of native heart without angina pectoris 09/03/2020   Mixed hyperlipidemia 09/03/2020   Essential hypertension 09/03/2020   Chronic deep vein thrombosis (DVT) of popliteal vein of left lower extremity (HCC) 04/06/2018   Varicose veins of left lower extremity with complications 04/06/2018   Atypical chest pain 08/09/2015   Thrombocytopenia (HCC) 02/26/2014   Hyperkalemia 02/25/2014   Acute respiratory failure with hypoxia (HCC) 02/25/2014   Morbid obesity (HCC) 03/20/2012   DM (diabetes mellitus), type 2 with renal complications (HCC) 03/20/2012   Venous stasis 03/20/2012   PCP:  [name], NP Pharmacy:   [facility] - [place], [place] - [address] [place] [address] Phone: [phone] Fax: [phone]   Social Drivers of Health (SDOH) Social History: SDOH Screenings   Food Insecurity: Low Risk  (04/21/2024)   Received from [facility]  Housing: Low  Risk  (04/21/2024)   Received from [facility]  Transportation Needs: No Transportation Needs (04/21/2024)   Received from [facility]  Utilities: Low  Risk  (04/21/2024)   Received from [facility]  Social Connections: Unknown (06/30/2023)   Received from [facility]  Tobacco Use: Low Risk  (05/13/2024)   SDOH Interventions:     Readmission Risk Interventions     No data to display

## 2024-07-13 NOTE — Progress Notes (Signed)
 Transition of Care [facility]) - CAGE-AID Screening   Patient Details  Name: Travis Rollins MRN: [id number] Date of Birth: 29-Apr-1966  Transition of Care [facility]) CM/SW Contact:    Bernardino Mayotte, RN Phone Number: 07/13/2024, 8:37 PM   Clinical Narrative:  Patient denies the use of alcohol and illicit substances. Resources not given at this time.  CAGE-AID Screening:    Have You Ever Felt You Ought to Cut Down on Your Drinking or Drug Use?: No Have People Annoyed You By Critizing Your Drinking Or Drug Use?: No Have You Felt Bad Or Guilty About Your Drinking Or Drug Use?: No Have You Ever Had a Drink or Used Drugs First Thing In The Morning to Steady Your Nerves or to Get Rid of a Hangover?: No CAGE-AID Score: 0  Substance Abuse Education Offered: No

## 2024-07-13 NOTE — Progress Notes (Signed)
 NAME:  Travis Rollins, MRN:  [id number], DOB:  [birth year], LOS: 1 ADMISSION DATE:  07/12/2024, CONSULTATION DATE:  07/12/2024 REFERRING MD:  Mliss Boyers, MD, CHIEF COMPLAINT:  Syncopal Episode  History of Present Illness:  58 y/o male with PMH for Obesity, DMT2 on insulin , HTN, DVT/PEs in the past on Coumadin  and s/p IVC filter, Depression who presented as a level; I trauma after having a syncopal episode.  He says he went out of his apartment to do his laundry as he normally dose once a month to the laundry mat when he suddenly began to have SOB, light headedness, says it all went white.  He was found by a neighbor who then call his month with who he lives and mother called 911.  Hematoma to back of his head and BP 70/30, he was diaphoretic, and c/o chest pains.  He was brought to ED again as a Level I trauma, however, no head bleed and no signs of trauma.  He was found to have a saddle PE with right heart strian.  His INR was 1.3 and he has been compliant with his Coumadin .  He says he gets his INR checked once every 3 months.   His Troponin was 191, LA 3.5, Cr 1.70.  He was given 1 liter NS bolus and starting on Heparin  drip, noting Platelets of 67 (chronically low but not this low). Patient seen on 7/31 in ED for SOB and back pain and thought he had a PE at that time, but CTA then on 7/31 showed old Pes. Pertinent  Medical History   Obesity, DMT2 on insulin , HTN, DVT/Pes in the past on Coumadin , Depression  Significant Hospital Events: Including procedures, antibiotic start and stop dates in addition to other pertinent events   8/5:  Admit to ICU, mechanical thrombectomy with bilateral clot retrieved.   Interim History / Subjective:  Today he feels improved. He wants to eat. No CP or SOB.   Objective    Blood pressure 108/82, pulse 87, temperature (!) 96.7 F (35.9 C), temperature source Oral, resp. rate 17, height 5' 6 (1.676 m), weight 102.6 kg, SpO2 100%. PAP: (70)/(38) 70/38       Intake/Output Summary (Last 24 hours) at 07/13/2024 0718 Last data filed at 07/13/2024 0700 Gross per 24 hour  Intake 234.33 ml  Output 1250 ml  Net -1015.67 ml   Filed Weights   07/12/24 1907 07/13/24 0000 07/13/24 0402  Weight: 113.9 kg 102.6 kg 102.6 kg    Examination: General: middle aged man lying in bed in NAD HENT: [place]/AT, eyes anicteric Lungs:  breathing comfortably on 2L [place], CTAB, no conversational dyspnea Cardiovascular: S1S2, RRR Abdomen: obese, soft, NT Extremities: mild blood on R femoral dressing but no signs of active bleeding or hematoma Neuro: awake, alert, answering questions approprietly, moving all extremities  K+ 5.5 Bicarb 19 BUN 31 Cr 1.38 BNP  12 Trop 1838 WBC 4 H/H 13.8/43.9 Platelets 54 UA 0-5 WBC INR 1.3 at admission  Resolved problem list   Assessment and Plan  Cardiac syncope due to obstructive shock- saddle emboli, s/p mechanical thrombectomy -con't heparin ; restart warfarin to start bridge. Anticipate he will eventually need lovenox  to complete bridge -echo  Saddle pulmonary emboli; massive PE with 2 syncopal episodes before intervention. History of antiphospholipid Ab syndrome per patient, but no documented history of this in his chart. Subtherapeutic on coumadin . Troponin elevation due to PE.  -coumadin  restart + heparin  bridge -echo -con't hematology regarding thrombocytopenia and ongoing  management of APS. Not sure he is attached to a coumadin  clinic or who is managing his warfarin [facility] primary care?) -ambulate today to assess or orthostatic changes in BP and HR -Needs lifelong AC and to be therapeutic on coumadin . Question if we can get him into a coumadin  clinic-- he is a patient of heart care (Dr. Patwardin) -needs to keep up with age appropriate cancer screenings as OP  Acute respiratory failure with hypoxia -wean supplemental O2 as able  HLD -restart PTA statin, zetia   AKI due to massive PE, improving -strict  I/O -renally dose meds, avoid nephrotoxins -montior  Lactic Acidosis due to shock -no additional monitoring needed  DMT  2 -add semglee  10 units daily -add mealtime insulin  aspart 3 units TIDAC -SSI PRN -goal BG 140-180 while admitted -A1c pending  HTN by history -hold on PTA antihypertensives for now given low normal BPs  Morbid Obesity -recommend modest weight loss as a long-term goal  Thrombocytopenia -Hematology consult  BPH -resume PTA tamuslosin  Hyperkalemia -lokelma  -recheck tomorrow  If he is stable out of bed today can transfer tot he floor later today.   Best Practice (right click and Reselect all SmartList Selections daily)   Diet/type: Regular consistency (see orders) DVT prophylaxis systemic heparin , coumadin  Pressure ulcer(s): N/A GI prophylaxis: N/A Lines: N/A Foley:  N/A Code Status:  full code   Labs   CBC: Recent Labs  Lab 07/07/24 1024 07/12/24 1803 07/12/24 1809 07/13/24 0105  WBC 4.3 5.2  --  4.0  NEUTROABS 2.4  --   --   --   HGB 15.0 14.4 16.0 13.8  HCT 47.2 46.5 47.0 43.9  MCV 98.3 100.9*  --  100.5*  PLT 79* 67*  --  54*    Basic Metabolic Panel: Recent Labs  Lab 07/07/24 1024 07/12/24 1803 07/12/24 1809 07/13/24 0105  NA 138 138 141 140  K 4.7 4.6 4.0 5.5*  CL 112* 109 113* 112*  CO2 15* 16*  --  19*  GLUCOSE 149* 241* 238* 208*  BUN 24* 32* 32* 31*  CREATININE 1.06 1.76* 1.70* 1.38*  CALCIUM  9.0 8.8*  --  8.5*  MG  --   --   --  2.2  PHOS  --   --   --  4.1   GFR: Estimated Creatinine Clearance: 66.2 mL/min (A) (by C-G formula based on SCr of 1.38 mg/dL (H)). Recent Labs  Lab 07/07/24 1024 07/12/24 1803 07/12/24 1809 07/12/24 1858 07/13/24 0105  WBC 4.3 5.2  --   --  4.0  LATICACIDVEN  --   --  3.5* 3.6*  --     Liver Function Tests: Recent Labs  Lab 07/12/24 1803  AST 360*  ALT 311*  ALKPHOS 85  BILITOT 1.0  PROT 6.5  ALBUMIN 3.3*    Critical care time:      [name], DO  07/13/24 8:56 AM [facility]  For contact information, see Amion. If no response to pager, please call PCCM consult pager. After hours, 7PM- 7AM, please call Elink.

## 2024-07-13 NOTE — Progress Notes (Signed)
 eLink Physician-Brief Progress Note Patient Name: Travis Rollins DOB: 12-Jul-1966 MRN: [id number]   Date of Service  07/13/2024  HPI/Events of Note  58 year old male with a history of diabetes, hypertension, DVT/PEs on chronic Coumadin  with IVC filter in place who presented after syncopal episode with dyspnea and lightheadedness found to have pulmonary embolus status post thrombectomy in the setting of obstructive shock.  Patient has normal vitals on a heparin  infusion.  Saturating 100% on room air.  Results show metabolic acidosis, hyperglycemia, elevated creatinine, transaminitis, and imaging consistent with acute pulmonary embolus and RV distention.  Status post IVC filter retrieval, thrombectomy, and venogram.  eICU Interventions  Maintain systemic anticoagulation with heparin , likely needs coagulopathy workup as an outpatient  Echocardiogram can consider vasopressors if needed to maintain MAP greater than 65  No documented contraindications to systemic thrombolysis  DVT treatment with heparin  GI prophylaxis not indicated      Intervention Category Evaluation Type: New Patient Evaluation  Travis Rollins 07/13/2024, 12:42 AM

## 2024-07-13 NOTE — Progress Notes (Signed)
 ANTICOAGULATION CONSULT NOTE  Pharmacy Consult for Heparin  + warfarin Indication: pulmonary embolus  Patient Measurements: Height: 5' 6 (167.6 cm) Weight: 102.6 kg (226 lb 3.1 oz) IBW/kg (Calculated) : 63.8 Heparin  Dosing Weight: 90 kg  Vital Signs: Temp: 98.9 F (37.2 C) (08/06 1954) Temp Source: Oral (08/06 1954) BP: 131/84 (08/06 1924) Pulse Rate: 99 (08/06 1924)  Labs: Recent Labs    07/12/24 1803 07/12/24 1809 07/13/24 0105 07/13/24 0942 07/13/24 1557 07/13/24 1906  HGB 14.4 16.0 13.8  --  11.8*  --   HCT 46.5 47.0 43.9  --  37.5*  --   PLT 67*  --  54*  --  55*  --   LABPROT 16.6*  --   --   --   --   --   INR 1.3*  --   --   --   --   --   HEPARINUNFRC  --   --  >1.10* >1.10*  --  0.78*  CREATININE 1.76* 1.70* 1.38*  --   --   --   TROPONINIHS 191*  --  1,838*  --   --   --     Estimated Creatinine Clearance: 66.2 mL/min (A) (by C-G formula based on SCr of 1.38 mg/dL (H)).   Assessment: 2 yom with a history of DM, PE on warfarin and IVC in place, chronic DVT. Patient is presenting with fell and hypotension. Heparin  per pharmacy consult placed for pulmonary embolus. CTA w/ acute bilateral pulmonary emboli with large clot burden and right heart strain c/w submassive. PT/INR 16.6/1.3; Hgb 16; plt 67. Patient s/p IVC filter retrieval/replacement and thrombectomy on 07/12/24 PM.   PTA warfarin regimen: 4 mg daily INR goal range: 3.0-3.5 (for recurrent pulmonary embolisms)  07/13/24 1200: Heparin  level still remains supratherapeutic (>1.1) on heparin  1600 units/hr. Level appropriately drawn from right arm, heparin  infusing into left arm. Noted patient has possible right gluteal intramuscular hematoma per RN. Hgb stable at 13.8, PLT decreased to 54 this morning. Discussed with MD, plan to re-initiate warfarin today. INR on admission was 1.3, subtherapeutic on PTA regimen.   Heparin  level is down but still above goal. We will decrease rate and check a level in AM.    Goal of Therapy:  Heparin  level 0.3-0.7 units/ml INR Goal: 3.0-3.5 (for recurrent pulmonary embolisms) Monitor platelets by anticoagulation protocol: Yes   Plan:  Decrease heparin  to 1200 units/hr Check anti-Xa level in AM Daily heparin  level while on heparin  Continue to monitor H&H, platelets, and s/sx of bleeding  Sergio Batch, PharmD, BCIDP, AAHIVP, CPP Infectious Disease Pharmacist 07/13/2024 8:05 PM

## 2024-07-13 NOTE — Progress Notes (Signed)
*  PRELIMINARY RESULTS* Echocardiogram 2D Echocardiogram has been performed.  Travis Rollins 07/13/2024, 9:03 AM

## 2024-07-13 NOTE — Consult Note (Cosign Needed)
 [facility]  [phone] [phone]    HEMATOLOGY CONSULTATION  PURPOSE OF CONSULTATION/CHIEF COMPLAINT: Recurrent clots  Referring MD: Dr. Gretta   HPI: Travis Rollins. [name] is a 58 year old male patient who was admitted on 07/12/2024 after syncopal episode.  Patient with history of thrombosis on Coumadin  and status post IVC filter.  He was found by her neighbor and was brought into the ED as a trauma patient.  Workup was done and patient found to have a saddle PE with right heart strain.  Therefore hematology evaluation has been requested.  Of note, patient was seen by hematology/Dr. Timmy 3 years ago and was lost to follow-up.   Patient is seen and assessed today.  He is awake alert and oriented x 3.  He began to feel very short of breath and blacked out and was found by his neighbor who called the ambulance.   Medical history as stated is significant for previous PE, hypertension, diabetes, AKI. Surgical history includes IVC filter over 10 years ago, and thrombectomy. Family history is noncontributory. Social history is noncontributory.  Patient denies tobacco use, alcohol use, illicit or recreational drug use.  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  Reports he feels much better today than when he arrived and does not feel short of breath.     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 - IVC filter retrieval and filter placement thrombectomy 07/12/2024 - States he has previously been on Xarelto, Eliquis, and more recently Coumadin .  Reports he has been compliant in taking dosages as he was instructed. -Asked patient to clarify who has been ordering his Coumadin  as he has been lost to follow-up with hematology and he states his PCP at [facility] has been following his INR every 3 months or  so. - On IV heparin , continue per protocol - Cardio and critical care teams following -Hematology/Dr. Timmy will make further evaluation and treatment recommendations.  Thrombocytopenia - Platelet count 54K - Monitor closely for active bleeding - No transfusional intervention required at this time.  Transfuse for counts <20 K or <50 K with active bleeding. - Monitor CBC with differential  Renal insufficiency - Improving - Elevated creatinine and BUN, decreased GFR - Avoid nephrotoxic agents - Monitor renal function  Hypertension Hyperlipidemia Diabetes - Monitor blood pressure - Monitor blood glucose levels  Past Medical History:  Diagnosis Date   Anemia    Carpal tunnel syndrome    Deep vein thrombosis (HCC) 07/2013, 10/2014, 03/16/2015   LLE (studies at [facility])   Depression    Diabetes mellitus    Hyperlipidemia    Hypertension    Obesity    Osteoarthritis    Peripheral vascular disease (HCC)    6 yrs ago, DVT in Lt knee/ groin   Pulmonary embolism (HCC)    hx of x 3 per patient; most recent 02/2014   Sinusitis    Sleep apnea   :  Past Surgical History:  Procedure Laterality Date   CARDIAC CATHETERIZATION N/A 08/10/2015   Procedure: Left Heart Cath and Coronary Angiography;  Surgeon: Gordy Bergamo, MD;  Location: [facility] INVASIVE CV LAB;  Service: Cardiovascular;  Laterality: N/A;   CYSTOSCOPY WITH RETROGRADE PYELOGRAM, URETEROSCOPY AND STENT PLACEMENT Bilateral 01/03/2015   Procedure: CYSTOSCOPY WITH RETROGRADE PYELOGRAM, RIGHT URETEROSCOPY AND RIGHT  STENT PLACEMENT;  Surgeon: Ricardo Likens, MD;  Location: [facility] ORS;  Service: Urology;  Laterality: Bilateral;   CYSTOSCOPY WITH RETROGRADE PYELOGRAM, URETEROSCOPY AND STENT PLACEMENT Right 03/12/2016   Procedure: CYSTOSCOPY WITH RETROGRADE PYELOGRAM, URETEROSCOPY AND STENT PLACEMENT;  Surgeon: Ricardo Likens, MD;  Location: [facility];  Service: Urology;  Laterality: Right;   ENDOVENOUS ABLATION SAPHENOUS VEIN W/ LASER Left 04/20/2018    endovenous laser ablation left greater saphenous vein by Lynwood Collum MD    HOLMIUM LASER APPLICATION Right 01/03/2015   Procedure: HOLMIUM LASER APPLICATION;  Surgeon: Ricardo Likens, MD;  Location: [facility];  Service: Urology;  Laterality: Right;   HOLMIUM LASER APPLICATION Right 03/12/2016   Procedure: HOLMIUM LASER APPLICATION;  Surgeon: Ricardo Likens, MD;  Location: [facility];  Service: Urology;  Laterality: Right;   IR ANGIOGRAM PULMONARY BILATERAL SELECTIVE  07/12/2024   IR ANGIOGRAM SELECTIVE EACH ADDITIONAL VESSEL  07/12/2024   IR ANGIOGRAM SELECTIVE EACH ADDITIONAL VESSEL  07/12/2024   IR IVC FILTER PLMT / S&I /IMG GUID/MOD SED  07/12/2024   IR IVC FILTER RETRIEVAL / S&I /IMG GUID/MOD SED  07/12/2024   IR THROMBECT PRIM MECH ADD (INCLU) MOD SED  07/12/2024   IR THROMBECT PRIM MECH ADD (INCLU) MOD SED  07/12/2024   IR THROMBECT PRIM MECH INIT (INCLU) MOD SED  07/12/2024   IR US  GUIDE VASC ACCESS RIGHT  07/12/2024   IR US  GUIDE VASC ACCESS RIGHT  07/12/2024   IR VENOCAVAGRAM IVC  07/12/2024   IVC filter   07/2014    TONSILLECTOMY     tubes removd from ears    :  Allergies  Allergen Reactions   Penicillins Hives    Has patient had a PCN reaction causing immediate rash, facial/tongue/throat swelling, SOB or lightheadedness with hypotension: No Has patient had a PCN reaction causing severe rash involving mucus membranes or skin necrosis: No Has patient had a PCN reaction that required hospitalization No Has patient had a PCN reaction occurring within the last 10 years: No If all of the above answers are NO, then may proceed with Cephalosporin use.  :   Family History  Problem Relation Age of Onset   Kidney disease Father    Diabetes Father    Colon cancer Neg Hx    Esophageal cancer Neg Hx    Rectal cancer Neg Hx    Stomach cancer Neg Hx   :   Social History   Socioeconomic History   Marital status: Single    Spouse name: Not on file   Number of children: 0   Years of education: Not on  file   Highest education level: Not on file  Occupational History   Not on file  Tobacco Use   Smoking status: Never   Smokeless tobacco: Never  Vaping Use   Vaping status: Never Used  Substance and Sexual Activity   Alcohol use: Never   Drug use: No   Sexual activity: Yes    Birth control/protection: Condom  Other Topics Concern   Not on file  Social History Narrative   Not on file   Social Drivers of Health   Financial Resource Strain: Not on file  Food Insecurity: Low Risk  (04/21/2024)   Received from [facility]   Hunger Vital Sign    Within the past 12 months, you worried that your food would run out before you got money to buy more: Never true    Within the past 12 months, the food you bought just  didn't last and you didn't have money to get more. : Never true  Transportation Needs: No Transportation Needs (04/21/2024)   Received from [facility]    In the past 12 months, has lack of reliable transportation kept you from medical appointments, meetings, work or from getting things needed for daily living? : No  Physical Activity: Not on file  Stress: Not on file  Social Connections: Unknown (06/30/2023)   Received from [facility]   Social Network    Social Network: Not on file  Intimate Partner Violence: Unknown (06/30/2023)   Received from [facility]   HITS    Physically Hurt: Not on file    Insult or Talk Down To: Not on file    Threaten Physical Harm: Not on file    Scream or Curse: Not on file  :   CURRENT MEDS: Current Facility-Administered Medications  Medication Dose Route Frequency Provider Last Rate Last Admin   atorvastatin  (LIPITOR) tablet 80 mg  80 mg Oral Daily Gretta Leita SQUIBB, DO   80 mg at 07/13/24 9093   Chlorhexidine  Gluconate Cloth 2 % PADS 6 each  6 each Topical Daily Ilah Corean HERO, PA-C   6 each at 07/13/24 0000   docusate sodium  (COLACE) capsule 100 mg  100 mg Oral BID PRN Ilah Corean HERO, PA-C        ezetimibe  (ZETIA ) tablet 10 mg  10 mg Oral Daily Gretta Leita P, DO   10 mg at 07/13/24 9093   heparin  ADULT infusion 100 units/mL (25000 units/273mL)  1,350 Units/hr Intravenous Continuous Serena Morna SAUNDERS, RPH 13.5 mL/hr at 07/13/24 1333 1,350 Units/hr at 07/13/24 1333   insulin  aspart (novoLOG ) injection 0-15 Units  0-15 Units Subcutaneous TID WC Cyndy Ozell DASEN, RPH   2 Units at 07/13/24 1214   insulin  aspart (novoLOG ) injection 0-5 Units  0-5 Units Subcutaneous QHS Gretta Leita P, DO       insulin  aspart (novoLOG ) injection 3 Units  3 Units Subcutaneous TID WC Gretta Leita SQUIBB, DO   3 Units at 07/13/24 1215   insulin  glargine-yfgn (SEMGLEE ) injection 10 Units  10 Units Subcutaneous Daily Gretta Leita SQUIBB, DO   10 Units at 07/13/24 9092   polyethylene glycol (MIRALAX  / GLYCOLAX ) packet 17 g  17 g Oral Daily PRN Ilah Corean HERO, PA-C       tamsulosin  (FLOMAX ) capsule 0.4 mg  0.4 mg Oral Daily Gretta Leita SQUIBB, DO   0.4 mg at 07/13/24 9093   warfarin (COUMADIN ) tablet 8 mg  8 mg Oral ONCE-1600 Serena Morna SAUNDERS, [facility]       Warfarin - Pharmacist Dosing Inpatient   Does not apply q1600 Serena Morna SAUNDERS, [facility]        PHYSICAL EXAMINATION: ECOG PERFORMANCE STATUS: 3 - Symptomatic, >50% confined to bed  Vitals:   07/13/24 1000 07/13/24 1100  BP: 116/60 115/82  Pulse: 88 84  Resp: 17 17  Temp:  97.7 F (36.5 C)  SpO2: 98% 96%   Filed Weights   07/12/24 1907 07/13/24 0000 07/13/24 0402  Weight: 251 lb 1.7 oz (113.9 kg) 226 lb 3.1 oz (102.6 kg) 226 lb 3.1 oz (102.6 kg)    GENERAL: alert, no distress and comfortable SKIN: skin color, texture, turgor are normal, no rashes or significant lesions EYES: normal, conjunctiva are pink and non-injected, sclera clear OROPHARYNX: no exudate, no erythema and lips, buccal mucosa, and tongue normal  NECK: supple, thyroid  normal size, non-tender, without nodularity LYMPH:  no palpable lymphadenopathy in the cervical, axillary or inguinal LUNGS:  clear to auscultation and percussion with normal breathing effort HEART: regular rate & rhythm and no murmurs and no lower extremity edema ABDOMEN: abdomen soft, non-tender and normal bowel sounds MUSCULOSKELETAL: no cyanosis of digits and no clubbing  PSYCH: alert & oriented x 3 with fluent speech NEURO: no focal motor/sensory deficits   LABS: Lab Results  Component Value Date   WBC 4.0 07/13/2024   HGB 13.8 07/13/2024   HCT 43.9 07/13/2024   MCV 100.5 (H) 07/13/2024   PLT 54 (L) 07/13/2024    Lab Results  Component Value Date   WBC 4.0 07/13/2024   HGB 13.8 07/13/2024   HCT 43.9 07/13/2024   PLT 54 (L) 07/13/2024   GLUCOSE 208 (H) 07/13/2024   CHOL 209 (H) 03/22/2012   TRIG 166 (H) 03/22/2012   HDL 44 03/22/2012   LDLCALC 132 (H) 03/22/2012   ALT 311 (H) 07/12/2024   AST 360 (H) 07/12/2024   NA 140 07/13/2024   K 5.5 (H) 07/13/2024   CL 112 (H) 07/13/2024   CREATININE 1.38 (H) 07/13/2024   BUN 31 (H) 07/13/2024   CO2 19 (L) 07/13/2024   INR 1.3 (H) 07/12/2024   HGBA1C 7.6 (H) 05/21/2021    ECHOCARDIOGRAM COMPLETE Result Date: 07/13/2024    ECHOCARDIOGRAM REPORT   Patient Name:   BRANDUN PINN Date of Exam: 07/13/2024 Medical Rec #:  [id number]          Height:       66.0 in Accession #:    [id number]         Weight:       226.2 lb Date of Birth:  1966/06/15           BSA:          2.107 m Patient Age:    58 years           BP:           108/82 mmHg Patient Gender: M                  HR:           87 bpm. Exam Location:  Inpatient Procedure: 2D Echo, Color Doppler and Cardiac Doppler (Both Spectral and Color            Flow Doppler were utilized during procedure). Indications:    Pulmonary embolus  History:        Patient has prior history of Echocardiogram examinations, most                 recent 05/21/2021. Signs/Symptoms:Chest Pain.  Sonographer:    Benard Stallion Referring Phys: [id number] NORLEEN BIRCH PAYNE  Sonographer Comments: Technically difficult study due to poor echo  windows. IMPRESSIONS  1. Left ventricular ejection fraction, by estimation, is 45 to 50%. The left ventricle has mildly decreased function. Left ventricular endocardial border not optimally defined to evaluate regional wall motion. Left ventricular diastolic parameters were grossly normal.  2. Right ventricular systolic function is mildly reduced. The right ventricular size is normal.  3. The mitral valve is grossly normal. Trivial mitral valve regurgitation. No evidence of mitral stenosis.  4. The aortic valve is grossly normal. Aortic valve regurgitation is mild. No aortic stenosis is present. FINDINGS  Left Ventricle: Left ventricular ejection fraction, by estimation, is 45 to 50%. The left ventricle has mildly decreased function. Left ventricular endocardial border not optimally defined to evaluate  regional wall motion. The left ventricular internal cavity size was normal in size. There is no left ventricular hypertrophy. Left ventricular diastolic parameters were normal. Right Ventricle: The right ventricular size is normal. No increase in right ventricular wall thickness. Right ventricular systolic function is mildly reduced. Left Atrium: Left atrial size was normal in size. Right Atrium: Right atrial size was normal in size. Pericardium: There is no evidence of pericardial effusion. Mitral Valve: The mitral valve is grossly normal. Trivial mitral valve regurgitation. No evidence of mitral valve stenosis. Tricuspid Valve: The tricuspid valve is normal in structure. Tricuspid valve regurgitation is trivial. No evidence of tricuspid stenosis. Aortic Valve: The aortic valve is grossly normal. Aortic valve regurgitation is mild. Aortic regurgitation PHT measures 636 msec. No aortic stenosis is present. Aortic valve mean gradient measures 5.0 mmHg. Aortic valve peak gradient measures 8.6 mmHg. Aortic valve area, by VTI measures 2.38 cm. Pulmonic Valve: The pulmonic valve was normal in structure. Pulmonic valve  regurgitation is not visualized. No evidence of pulmonic stenosis. Aorta: The aortic root is normal in size and structure. Venous: The inferior vena cava was not well visualized. IAS/Shunts: No atrial level shunt detected by color flow Doppler.  LEFT VENTRICLE PLAX 2D LVIDd:         4.60 cm   Diastology LVIDs:         3.60 cm   LV e' medial:    8.27 cm/s LV PW:         0.90 cm   LV E/e' medial:  7.1 LV IVS:        0.90 cm   LV e' lateral:   10.00 cm/s LVOT diam:     2.20 cm   LV E/e' lateral: 5.9 LV SV:         60 LV SV Index:   28 LVOT Area:     3.80 cm  RIGHT VENTRICLE RV S prime:     10.60 cm/s TAPSE (M-mode): 1.9 cm LEFT ATRIUM             Index        RIGHT ATRIUM           Index LA diam:        3.30 cm 1.57 cm/m   RA Area:     20.50 cm LA Vol (A2C):   42.0 ml 19.93 ml/m  RA Volume:   64.50 ml  30.61 ml/m LA Vol (A4C):   43.2 ml 20.50 ml/m LA Biplane Vol: 42.5 ml 20.17 ml/m  AORTIC VALVE AV Area (Vmax):    2.34 cm AV Area (Vmean):   2.14 cm AV Area (VTI):     2.38 cm AV Vmax:           146.50 cm/s AV Vmean:          100.800 cm/s AV VTI:            0.250 m AV Peak Grad:      8.6 mmHg AV Mean Grad:      5.0 mmHg LVOT Vmax:         90.30 cm/s LVOT Vmean:        56.700 cm/s LVOT VTI:          0.157 m LVOT/AV VTI ratio: 0.63 AI PHT:            636 msec  AORTA Ao Root diam: 3.20 cm MITRAL VALVE  TRICUSPID VALVE MV Area (PHT): 4.15 cm    TR Peak grad:   15.8 mmHg MV Decel Time: 183 msec    TR Vmax:        199.00 cm/s MV E velocity: 58.90 cm/s MV A velocity: 79.80 cm/s  SHUNTS MV E/A ratio:  0.74        Systemic VTI:  0.16 m                            Systemic Diam: 2.20 cm Soyla Merck MD Electronically signed by Soyla Merck MD Signature Date/Time: 07/13/2024/11:08:18 AM    Final    IR THROMBECT PRIM MECH INIT (INCLU) MOD SED Result Date: 07/13/2024 INDICATION: [age]-year-old male with history of acute, high risk pulmonary embolism. EXAM: 1. Ultrasound-guided vascular access of the  right common femoral vein. 2. Inferior vena cavogram. 3. Ultrasound-guided vascular access of the right internal jugular vein. 4. Inferior vena cava filter retrieval. 5. Pulmonary angiogram. 6. Central manometry. 7. Bilateral pulmonary artery aspiration thrombectomy. 8. Inferior vena cava filter placement. COMPARISON:  CTA chest from earlier the same day MEDICATIONS: 10,000 units heparin , intravenous ANESTHESIA/SEDATION: Moderate (conscious) sedation was employed during this procedure. A total of Versed  3 mg and Fentanyl  0 mcg was administered intravenously. Moderate Sedation Time: 97 minutes. The patient's level of consciousness and vital signs were monitored continuously by radiology nursing throughout the procedure under my direct supervision. FLUOROSCOPY TIME:  Nine hundred sixteen mGy reference air kerma COMPLICATIONS: None immediate. TECHNIQUE: Informed written consent was obtained from the patient after a thorough discussion of the procedural risks, benefits and alternatives. All questions were addressed. Maximal Sterile Barrier Technique was utilized including caps, mask, sterile gowns, sterile gloves, sterile drape, hand hygiene and skin antiseptic. A timeout was performed prior to the initiation of the procedure. Preprocedure ultrasound evaluation demonstrated patency of the right common femoral vein. The procedure was planned. Subdermal Local anesthesia was administered 1% lidocaine . A small skin nick was made. Under direct ultrasound visualization, the right common femoral vein was accessed with a 21 gauge micropuncture needle. A permanent ultrasound image was captured and stored in the record. Micropuncture sheath was inserted followed by placement of a Wholey wire was directed to the inferior vena cava under fluoroscopic guidance. An 8 [place] vascular sheath was placed. A pigtail catheter was inserted to the peripheral inferior vena cava and inferior vena cavagram was performed. Inferior vena cavagram  was significant for presence of an infrarenal IVC filter with possible nonocclusive thrombus about the apex of the filter with sluggish antegrade flow. Given failure of anticoagulation and indwelling IVC filter with presence of thrombus, decision was made to remove the indwelling filter. Therefore, the right internal jugular vein was accessed with a 21 gauge micropuncture needle. A permanent ultrasound image was captured and stored in the record. A micropuncture sheath was introduced through which a Wholey wire was advanced to the peripheral inferior vena cava. Serial dilation was performed followed by introduction of an argon triple loop snare IVC filter retrieval coaxial kit. The snare was then used to capture the apex of the filter which was retrieved without complication. Repeat inferior vena cavagram was performed which demonstrated patency of the normal caliber inferior vena cava without evidence of filling defect or extravasation. Attention was then turned toward PE thrombectomy. In pre close fashion, 2 ProGlides at the 10 o'clock and 2 o'clock positions. Over the wire, a 24 [place] Inari sheath was then placed and  directed to the inferior vena cava. Over the wire, a double angle pigtail catheter was inserted and under fluoroscopic guidance was attempted to be directed through the right atrium and right ventricle to the main pulmonary artery, however due to the size of the right heart this was difficult. Therefore, a Swan-Ganz catheter was inserted with the balloon inflated which was floated to the main pulmonary artery. Central manometry was performed at this location with pulmonary arterial pressure of 70/38 with a mean of 45 mm Hg. The balloon was deflated and over a 0.025 guidewire the Swan-Ganz catheter was exchanged for a 4 French angled tip glide catheter. A Wholey wire was then inserted and directed into the left inferior pulmonary artery. The catheter was advanced to this location. The catheter was  then exchanged for a 6 French angled tip select catheter. The wire was exchanged for a short taper superstiff Amplatz wire. The catheter was removed. A 24 [place] Flowtreiver aspiration catheter was then directed under fluoroscopic guidance to the main pulmonary artery. The inner dilator was removed. Pulmonary angiogram was then performed which demonstrated similar filling defects in the main and bilateral pulmonary arteries compatible with acute pulmonary embolism. The aspiration catheter was then advanced to the left pulmonary artery. Multiple aspirations were performed which yielded a large volume of acute appearing thrombus. Left pulmonary angiogram was then repeated which demonstrated significantly improved patency and perfusion of the left lung. There is a small volume additional thrombus in the proximal left inferior lobar pulmonary artery. Therefore, the T20 curved aspiration catheter was advanced in coaxial fashion and aspiration was performed which yielded additional small volume acute appearing thrombus. The coaxial system was then directed to the right pulmonary artery in the wire was inserted into the right inferior lobar pulmonary artery. Aspiration thrombectomy was then performed which yielded large volume acute appearing thrombus. The large volume thrombus was affixed to the distal tip of the aspiration catheter and required removal of the coaxial aspiration device over the wire and Ree insertion. At this point, wire access to the right pulmonary artery was lost. Given significant improvement in vital signs and large volume thrombus, the pulmonary artery was not selected for completion measurements. At this point, the patient's tachycardia improved and oxygen saturations improved. The catheters were removed. Repeat inferior vena cavagram was then performed for planning purposes which demonstrated patency and again no evidence of extravasation. Over the wire, an option elite IVC filter was placed in an  infrarenal location under fluoroscopic visualization. The sheath was then removed and the ProGlides were tied and cut. There was insufficient hemostasis, therefore a 0 Prolene pursestring stitch was placed about the right groin access site. Hemostasis was achieved at the right neck and a pressure dressing was applied. The patient tolerated the procedure well and was transferred to the ICU in good condition. IMPRESSION: 1. Inferior vena cavogram demonstrates indwelling inferior vena cava filter with possible adherent thrombus. 2. Technically successful inferior vena cava filter retrieval. 3. Successful bilateral pulmonary artery thrombectomy with removal of large volume acute appearing thrombus. 4. Technically successful inferior vena cava filter placement. Ester Sides, MD [facility] Electronically Signed   By: Ester Sides M.D.   On: 07/13/2024 08:49   IR US  Guide Vasc Access Right Result Date: 07/13/2024 INDICATION: [age]-year-old male with history of acute, high risk pulmonary embolism. EXAM: 1. Ultrasound-guided vascular access of the right common femoral vein. 2. Inferior vena cavogram. 3. Ultrasound-guided vascular access of the right internal  jugular vein. 4. Inferior vena cava filter retrieval. 5. Pulmonary angiogram. 6. Central manometry. 7. Bilateral pulmonary artery aspiration thrombectomy. 8. Inferior vena cava filter placement. COMPARISON:  CTA chest from earlier the same day MEDICATIONS: 10,000 units heparin , intravenous ANESTHESIA/SEDATION: Moderate (conscious) sedation was employed during this procedure. A total of Versed  3 mg and Fentanyl  0 mcg was administered intravenously. Moderate Sedation Time: 97 minutes. The patient's level of consciousness and vital signs were monitored continuously by radiology nursing throughout the procedure under my direct supervision. FLUOROSCOPY TIME:  Nine hundred sixteen mGy reference air kerma  COMPLICATIONS: None immediate. TECHNIQUE: Informed written consent was obtained from the patient after a thorough discussion of the procedural risks, benefits and alternatives. All questions were addressed. Maximal Sterile Barrier Technique was utilized including caps, mask, sterile gowns, sterile gloves, sterile drape, hand hygiene and skin antiseptic. A timeout was performed prior to the initiation of the procedure. Preprocedure ultrasound evaluation demonstrated patency of the right common femoral vein. The procedure was planned. Subdermal Local anesthesia was administered 1% lidocaine . A small skin nick was made. Under direct ultrasound visualization, the right common femoral vein was accessed with a 21 gauge micropuncture needle. A permanent ultrasound image was captured and stored in the record. Micropuncture sheath was inserted followed by placement of a Wholey wire was directed to the inferior vena cava under fluoroscopic guidance. An 8 [place] vascular sheath was placed. A pigtail catheter was inserted to the peripheral inferior vena cava and inferior vena cavagram was performed. Inferior vena cavagram was significant for presence of an infrarenal IVC filter with possible nonocclusive thrombus about the apex of the filter with sluggish antegrade flow. Given failure of anticoagulation and indwelling IVC filter with presence of thrombus, decision was made to remove the indwelling filter. Therefore, the right internal jugular vein was accessed with a 21 gauge micropuncture needle. A permanent ultrasound image was captured and stored in the record. A micropuncture sheath was introduced through which a Wholey wire was advanced to the peripheral inferior vena cava. Serial dilation was performed followed by introduction of an argon triple loop snare IVC filter retrieval coaxial kit. The snare was then used to capture the apex of the filter which was retrieved without complication. Repeat inferior vena cavagram was  performed which demonstrated patency of the normal caliber inferior vena cava without evidence of filling defect or extravasation. Attention was then turned toward PE thrombectomy. In pre close fashion, 2 ProGlides at the 10 o'clock and 2 o'clock positions. Over the wire, a 24 [place] Inari sheath was then placed and directed to the inferior vena cava. Over the wire, a double angle pigtail catheter was inserted and under fluoroscopic guidance was attempted to be directed through the right atrium and right ventricle to the main pulmonary artery, however due to the size of the right heart this was difficult. Therefore, a Swan-Ganz catheter was inserted with the balloon inflated which was floated to the main pulmonary artery. Central manometry was performed at this location with pulmonary arterial pressure of 70/38 with a mean of 45 mm Hg. The balloon was deflated and over a 0.025 guidewire the Swan-Ganz catheter was exchanged for a 4 French angled tip glide catheter. A Wholey wire was then inserted and directed into the left inferior pulmonary artery. The catheter was advanced to this location. The catheter was then exchanged for a 6 French angled tip select catheter. The wire was exchanged for a short taper superstiff Amplatz wire. The catheter was removed. A 24 [place]  Flowtreiver aspiration catheter was then directed under fluoroscopic guidance to the main pulmonary artery. The inner dilator was removed. Pulmonary angiogram was then performed which demonstrated similar filling defects in the main and bilateral pulmonary arteries compatible with acute pulmonary embolism. The aspiration catheter was then advanced to the left pulmonary artery. Multiple aspirations were performed which yielded a large volume of acute appearing thrombus. Left pulmonary angiogram was then repeated which demonstrated significantly improved patency and perfusion of the left lung. There is a small volume additional thrombus in the proximal  left inferior lobar pulmonary artery. Therefore, the T20 curved aspiration catheter was advanced in coaxial fashion and aspiration was performed which yielded additional small volume acute appearing thrombus. The coaxial system was then directed to the right pulmonary artery in the wire was inserted into the right inferior lobar pulmonary artery. Aspiration thrombectomy was then performed which yielded large volume acute appearing thrombus. The large volume thrombus was affixed to the distal tip of the aspiration catheter and required removal of the coaxial aspiration device over the wire and Ree insertion. At this point, wire access to the right pulmonary artery was lost. Given significant improvement in vital signs and large volume thrombus, the pulmonary artery was not selected for completion measurements. At this point, the patient's tachycardia improved and oxygen saturations improved. The catheters were removed. Repeat inferior vena cavagram was then performed for planning purposes which demonstrated patency and again no evidence of extravasation. Over the wire, an option elite IVC filter was placed in an infrarenal location under fluoroscopic visualization. The sheath was then removed and the ProGlides were tied and cut. There was insufficient hemostasis, therefore a 0 Prolene pursestring stitch was placed about the right groin access site. Hemostasis was achieved at the right neck and a pressure dressing was applied. The patient tolerated the procedure well and was transferred to the ICU in good condition. IMPRESSION: 1. Inferior vena cavogram demonstrates indwelling inferior vena cava filter with possible adherent thrombus. 2. Technically successful inferior vena cava filter retrieval. 3. Successful bilateral pulmonary artery thrombectomy with removal of large volume acute appearing thrombus. 4. Technically successful inferior vena cava filter placement. Ester Sides, MD [facility] Radiology Electronically Signed   By: Ester Sides M.D.   On: 07/13/2024 08:49   IR US  Guide Vasc Access Right Result Date: 07/13/2024 INDICATION: [age]-year-old male with history of acute, high risk pulmonary embolism. EXAM: 1. Ultrasound-guided vascular access of the right common femoral vein. 2. Inferior vena cavogram. 3. Ultrasound-guided vascular access of the right internal jugular vein. 4. Inferior vena cava filter retrieval. 5. Pulmonary angiogram. 6. Central manometry. 7. Bilateral pulmonary artery aspiration thrombectomy. 8. Inferior vena cava filter placement. COMPARISON:  CTA chest from earlier the same day MEDICATIONS: 10,000 units heparin , intravenous ANESTHESIA/SEDATION: Moderate (conscious) sedation was employed during this procedure. A total of Versed  3 mg and Fentanyl  0 mcg was administered intravenously. Moderate Sedation Time: 97 minutes. The patient's level of consciousness and vital signs were monitored continuously by radiology nursing throughout the procedure under my direct supervision. FLUOROSCOPY TIME:  Nine hundred sixteen mGy reference air kerma COMPLICATIONS: None immediate. TECHNIQUE: Informed written consent was obtained from the patient after a thorough discussion of the procedural risks, benefits and alternatives. All questions were addressed. Maximal Sterile Barrier Technique was utilized including caps, mask, sterile gowns, sterile gloves, sterile drape, hand hygiene and skin antiseptic. A timeout was performed prior to the initiation of the procedure. Preprocedure ultrasound evaluation demonstrated patency  of the right common femoral vein. The procedure was planned. Subdermal Local anesthesia was administered 1% lidocaine . A small skin nick was made. Under direct ultrasound visualization, the right common femoral vein was accessed with a 21 gauge micropuncture needle. A permanent ultrasound image was captured and stored in the record.  Micropuncture sheath was inserted followed by placement of a Wholey wire was directed to the inferior vena cava under fluoroscopic guidance. An 8 [place] vascular sheath was placed. A pigtail catheter was inserted to the peripheral inferior vena cava and inferior vena cavagram was performed. Inferior vena cavagram was significant for presence of an infrarenal IVC filter with possible nonocclusive thrombus about the apex of the filter with sluggish antegrade flow. Given failure of anticoagulation and indwelling IVC filter with presence of thrombus, decision was made to remove the indwelling filter. Therefore, the right internal jugular vein was accessed with a 21 gauge micropuncture needle. A permanent ultrasound image was captured and stored in the record. A micropuncture sheath was introduced through which a Wholey wire was advanced to the peripheral inferior vena cava. Serial dilation was performed followed by introduction of an argon triple loop snare IVC filter retrieval coaxial kit. The snare was then used to capture the apex of the filter which was retrieved without complication. Repeat inferior vena cavagram was performed which demonstrated patency of the normal caliber inferior vena cava without evidence of filling defect or extravasation. Attention was then turned toward PE thrombectomy. In pre close fashion, 2 ProGlides at the 10 o'clock and 2 o'clock positions. Over the wire, a 24 [place] Inari sheath was then placed and directed to the inferior vena cava. Over the wire, a double angle pigtail catheter was inserted and under fluoroscopic guidance was attempted to be directed through the right atrium and right ventricle to the main pulmonary artery, however due to the size of the right heart this was difficult. Therefore, a Swan-Ganz catheter was inserted with the balloon inflated which was floated to the main pulmonary artery. Central manometry was performed at this location with pulmonary arterial pressure  of 70/38 with a mean of 45 mm Hg. The balloon was deflated and over a 0.025 guidewire the Swan-Ganz catheter was exchanged for a 4 French angled tip glide catheter. A Wholey wire was then inserted and directed into the left inferior pulmonary artery. The catheter was advanced to this location. The catheter was then exchanged for a 6 French angled tip select catheter. The wire was exchanged for a short taper superstiff Amplatz wire. The catheter was removed. A 24 [place] Flowtreiver aspiration catheter was then directed under fluoroscopic guidance to the main pulmonary artery. The inner dilator was removed. Pulmonary angiogram was then performed which demonstrated similar filling defects in the main and bilateral pulmonary arteries compatible with acute pulmonary embolism. The aspiration catheter was then advanced to the left pulmonary artery. Multiple aspirations were performed which yielded a large volume of acute appearing thrombus. Left pulmonary angiogram was then repeated which demonstrated significantly improved patency and perfusion of the left lung. There is a small volume additional thrombus in the proximal left inferior lobar pulmonary artery. Therefore, the T20 curved aspiration catheter was advanced in coaxial fashion and aspiration was performed which yielded additional small volume acute appearing thrombus. The coaxial system was then directed to the right pulmonary artery in the wire was inserted into the right inferior lobar pulmonary artery. Aspiration thrombectomy was then performed which yielded large volume acute appearing thrombus. The large volume thrombus was affixed  to the distal tip of the aspiration catheter and required removal of the coaxial aspiration device over the wire and Ree insertion. At this point, wire access to the right pulmonary artery was lost. Given significant improvement in vital signs and large volume thrombus, the pulmonary artery was not selected for completion  measurements. At this point, the patient's tachycardia improved and oxygen saturations improved. The catheters were removed. Repeat inferior vena cavagram was then performed for planning purposes which demonstrated patency and again no evidence of extravasation. Over the wire, an option elite IVC filter was placed in an infrarenal location under fluoroscopic visualization. The sheath was then removed and the ProGlides were tied and cut. There was insufficient hemostasis, therefore a 0 Prolene pursestring stitch was placed about the right groin access site. Hemostasis was achieved at the right neck and a pressure dressing was applied. The patient tolerated the procedure well and was transferred to the ICU in good condition. IMPRESSION: 1. Inferior vena cavogram demonstrates indwelling inferior vena cava filter with possible adherent thrombus. 2. Technically successful inferior vena cava filter retrieval. 3. Successful bilateral pulmonary artery thrombectomy with removal of large volume acute appearing thrombus. 4. Technically successful inferior vena cava filter placement. Ester Sides, MD [facility] Radiology Electronically Signed   By: Ester Sides M.D.   On: 07/13/2024 08:49   IR IVC FILTER PLMT / S&I PORTER GUID/MOD SED Result Date: 07/13/2024 INDICATION: [age]-year-old male with history of acute, high risk pulmonary embolism. EXAM: 1. Ultrasound-guided vascular access of the right common femoral vein. 2. Inferior vena cavogram. 3. Ultrasound-guided vascular access of the right internal jugular vein. 4. Inferior vena cava filter retrieval. 5. Pulmonary angiogram. 6. Central manometry. 7. Bilateral pulmonary artery aspiration thrombectomy. 8. Inferior vena cava filter placement. COMPARISON:  CTA chest from earlier the same day MEDICATIONS: 10,000 units heparin , intravenous ANESTHESIA/SEDATION: Moderate (conscious) sedation was employed during this procedure.  A total of Versed  3 mg and Fentanyl  0 mcg was administered intravenously. Moderate Sedation Time: 97 minutes. The patient's level of consciousness and vital signs were monitored continuously by radiology nursing throughout the procedure under my direct supervision. FLUOROSCOPY TIME:  Nine hundred sixteen mGy reference air kerma COMPLICATIONS: None immediate. TECHNIQUE: Informed written consent was obtained from the patient after a thorough discussion of the procedural risks, benefits and alternatives. All questions were addressed. Maximal Sterile Barrier Technique was utilized including caps, mask, sterile gowns, sterile gloves, sterile drape, hand hygiene and skin antiseptic. A timeout was performed prior to the initiation of the procedure. Preprocedure ultrasound evaluation demonstrated patency of the right common femoral vein. The procedure was planned. Subdermal Local anesthesia was administered 1% lidocaine . A small skin nick was made. Under direct ultrasound visualization, the right common femoral vein was accessed with a 21 gauge micropuncture needle. A permanent ultrasound image was captured and stored in the record. Micropuncture sheath was inserted followed by placement of a Wholey wire was directed to the inferior vena cava under fluoroscopic guidance. An 8 [place] vascular sheath was placed. A pigtail catheter was inserted to the peripheral inferior vena cava and inferior vena cavagram was performed. Inferior vena cavagram was significant for presence of an infrarenal IVC filter with possible nonocclusive thrombus about the apex of the filter with sluggish antegrade flow. Given failure of anticoagulation and indwelling IVC filter with presence of thrombus, decision was made to remove the indwelling filter. Therefore, the right internal jugular vein was accessed with a 21 gauge micropuncture needle. A permanent  ultrasound image was captured and stored in the record. A micropuncture sheath was introduced  through which a Wholey wire was advanced to the peripheral inferior vena cava. Serial dilation was performed followed by introduction of an argon triple loop snare IVC filter retrieval coaxial kit. The snare was then used to capture the apex of the filter which was retrieved without complication. Repeat inferior vena cavagram was performed which demonstrated patency of the normal caliber inferior vena cava without evidence of filling defect or extravasation. Attention was then turned toward PE thrombectomy. In pre close fashion, 2 ProGlides at the 10 o'clock and 2 o'clock positions. Over the wire, a 24 [place] Inari sheath was then placed and directed to the inferior vena cava. Over the wire, a double angle pigtail catheter was inserted and under fluoroscopic guidance was attempted to be directed through the right atrium and right ventricle to the main pulmonary artery, however due to the size of the right heart this was difficult. Therefore, a Swan-Ganz catheter was inserted with the balloon inflated which was floated to the main pulmonary artery. Central manometry was performed at this location with pulmonary arterial pressure of 70/38 with a mean of 45 mm Hg. The balloon was deflated and over a 0.025 guidewire the Swan-Ganz catheter was exchanged for a 4 French angled tip glide catheter. A Wholey wire was then inserted and directed into the left inferior pulmonary artery. The catheter was advanced to this location. The catheter was then exchanged for a 6 French angled tip select catheter. The wire was exchanged for a short taper superstiff Amplatz wire. The catheter was removed. A 24 [place] Flowtreiver aspiration catheter was then directed under fluoroscopic guidance to the main pulmonary artery. The inner dilator was removed. Pulmonary angiogram was then performed which demonstrated similar filling defects in the main and bilateral pulmonary arteries compatible with acute pulmonary embolism. The aspiration  catheter was then advanced to the left pulmonary artery. Multiple aspirations were performed which yielded a large volume of acute appearing thrombus. Left pulmonary angiogram was then repeated which demonstrated significantly improved patency and perfusion of the left lung. There is a small volume additional thrombus in the proximal left inferior lobar pulmonary artery. Therefore, the T20 curved aspiration catheter was advanced in coaxial fashion and aspiration was performed which yielded additional small volume acute appearing thrombus. The coaxial system was then directed to the right pulmonary artery in the wire was inserted into the right inferior lobar pulmonary artery. Aspiration thrombectomy was then performed which yielded large volume acute appearing thrombus. The large volume thrombus was affixed to the distal tip of the aspiration catheter and required removal of the coaxial aspiration device over the wire and Ree insertion. At this point, wire access to the right pulmonary artery was lost. Given significant improvement in vital signs and large volume thrombus, the pulmonary artery was not selected for completion measurements. At this point, the patient's tachycardia improved and oxygen saturations improved. The catheters were removed. Repeat inferior vena cavagram was then performed for planning purposes which demonstrated patency and again no evidence of extravasation. Over the wire, an option elite IVC filter was placed in an infrarenal location under fluoroscopic visualization. The sheath was then removed and the ProGlides were tied and cut. There was insufficient hemostasis, therefore a 0 Prolene pursestring stitch was placed about the right groin access site. Hemostasis was achieved at the right neck and a pressure dressing was applied. The patient tolerated the procedure well and was transferred to the  ICU in good condition. IMPRESSION: 1. Inferior vena cavogram demonstrates indwelling inferior  vena cava filter with possible adherent thrombus. 2. Technically successful inferior vena cava filter retrieval. 3. Successful bilateral pulmonary artery thrombectomy with removal of large volume acute appearing thrombus. 4. Technically successful inferior vena cava filter placement. Ester Sides, MD [facility] Radiology Electronically Signed   By: Ester Sides M.D.   On: 07/13/2024 08:49   IR IVC Filter Retrieval / S&I /Img Guid/Mod Sed Result Date: 07/13/2024 INDICATION: [age]-year-old male with history of acute, high risk pulmonary embolism. EXAM: 1. Ultrasound-guided vascular access of the right common femoral vein. 2. Inferior vena cavogram. 3. Ultrasound-guided vascular access of the right internal jugular vein. 4. Inferior vena cava filter retrieval. 5. Pulmonary angiogram. 6. Central manometry. 7. Bilateral pulmonary artery aspiration thrombectomy. 8. Inferior vena cava filter placement. COMPARISON:  CTA chest from earlier the same day MEDICATIONS: 10,000 units heparin , intravenous ANESTHESIA/SEDATION: Moderate (conscious) sedation was employed during this procedure. A total of Versed  3 mg and Fentanyl  0 mcg was administered intravenously. Moderate Sedation Time: 97 minutes. The patient's level of consciousness and vital signs were monitored continuously by radiology nursing throughout the procedure under my direct supervision. FLUOROSCOPY TIME:  Nine hundred sixteen mGy reference air kerma COMPLICATIONS: None immediate. TECHNIQUE: Informed written consent was obtained from the patient after a thorough discussion of the procedural risks, benefits and alternatives. All questions were addressed. Maximal Sterile Barrier Technique was utilized including caps, mask, sterile gowns, sterile gloves, sterile drape, hand hygiene and skin antiseptic. A timeout was performed prior to the initiation of the procedure. Preprocedure ultrasound evaluation  demonstrated patency of the right common femoral vein. The procedure was planned. Subdermal Local anesthesia was administered 1% lidocaine . A small skin nick was made. Under direct ultrasound visualization, the right common femoral vein was accessed with a 21 gauge micropuncture needle. A permanent ultrasound image was captured and stored in the record. Micropuncture sheath was inserted followed by placement of a Wholey wire was directed to the inferior vena cava under fluoroscopic guidance. An 8 [place] vascular sheath was placed. A pigtail catheter was inserted to the peripheral inferior vena cava and inferior vena cavagram was performed. Inferior vena cavagram was significant for presence of an infrarenal IVC filter with possible nonocclusive thrombus about the apex of the filter with sluggish antegrade flow. Given failure of anticoagulation and indwelling IVC filter with presence of thrombus, decision was made to remove the indwelling filter. Therefore, the right internal jugular vein was accessed with a 21 gauge micropuncture needle. A permanent ultrasound image was captured and stored in the record. A micropuncture sheath was introduced through which a Wholey wire was advanced to the peripheral inferior vena cava. Serial dilation was performed followed by introduction of an argon triple loop snare IVC filter retrieval coaxial kit. The snare was then used to capture the apex of the filter which was retrieved without complication. Repeat inferior vena cavagram was performed which demonstrated patency of the normal caliber inferior vena cava without evidence of filling defect or extravasation. Attention was then turned toward PE thrombectomy. In pre close fashion, 2 ProGlides at the 10 o'clock and 2 o'clock positions. Over the wire, a 24 [place] Inari sheath was then placed and directed to the inferior vena cava. Over the wire, a double angle pigtail catheter was inserted and under fluoroscopic guidance was  attempted to be directed through the right atrium and right ventricle to the main pulmonary artery, however due  to the size of the right heart this was difficult. Therefore, a Swan-Ganz catheter was inserted with the balloon inflated which was floated to the main pulmonary artery. Central manometry was performed at this location with pulmonary arterial pressure of 70/38 with a mean of 45 mm Hg. The balloon was deflated and over a 0.025 guidewire the Swan-Ganz catheter was exchanged for a 4 French angled tip glide catheter. A Wholey wire was then inserted and directed into the left inferior pulmonary artery. The catheter was advanced to this location. The catheter was then exchanged for a 6 French angled tip select catheter. The wire was exchanged for a short taper superstiff Amplatz wire. The catheter was removed. A 24 [place] Flowtreiver aspiration catheter was then directed under fluoroscopic guidance to the main pulmonary artery. The inner dilator was removed. Pulmonary angiogram was then performed which demonstrated similar filling defects in the main and bilateral pulmonary arteries compatible with acute pulmonary embolism. The aspiration catheter was then advanced to the left pulmonary artery. Multiple aspirations were performed which yielded a large volume of acute appearing thrombus. Left pulmonary angiogram was then repeated which demonstrated significantly improved patency and perfusion of the left lung. There is a small volume additional thrombus in the proximal left inferior lobar pulmonary artery. Therefore, the T20 curved aspiration catheter was advanced in coaxial fashion and aspiration was performed which yielded additional small volume acute appearing thrombus. The coaxial system was then directed to the right pulmonary artery in the wire was inserted into the right inferior lobar pulmonary artery. Aspiration thrombectomy was then performed which yielded large volume acute appearing thrombus. The  large volume thrombus was affixed to the distal tip of the aspiration catheter and required removal of the coaxial aspiration device over the wire and Ree insertion. At this point, wire access to the right pulmonary artery was lost. Given significant improvement in vital signs and large volume thrombus, the pulmonary artery was not selected for completion measurements. At this point, the patient's tachycardia improved and oxygen saturations improved. The catheters were removed. Repeat inferior vena cavagram was then performed for planning purposes which demonstrated patency and again no evidence of extravasation. Over the wire, an option elite IVC filter was placed in an infrarenal location under fluoroscopic visualization. The sheath was then removed and the ProGlides were tied and cut. There was insufficient hemostasis, therefore a 0 Prolene pursestring stitch was placed about the right groin access site. Hemostasis was achieved at the right neck and a pressure dressing was applied. The patient tolerated the procedure well and was transferred to the ICU in good condition. IMPRESSION: 1. Inferior vena cavogram demonstrates indwelling inferior vena cava filter with possible adherent thrombus. 2. Technically successful inferior vena cava filter retrieval. 3. Successful bilateral pulmonary artery thrombectomy with removal of large volume acute appearing thrombus. 4. Technically successful inferior vena cava filter placement. Ester Sides, MD [facility] Radiology Electronically Signed   By: Ester Sides M.D.   On: 07/13/2024 08:49   IR Venocavagram Ivc Result Date: 07/13/2024 INDICATION: [age]-year-old male with history of acute, high risk pulmonary embolism. EXAM: 1. Ultrasound-guided vascular access of the right common femoral vein. 2. Inferior vena cavogram. 3. Ultrasound-guided vascular access of the right internal jugular vein. 4. Inferior vena cava filter  retrieval. 5. Pulmonary angiogram. 6. Central manometry. 7. Bilateral pulmonary artery aspiration thrombectomy. 8. Inferior vena cava filter placement. COMPARISON:  CTA chest from earlier the same day MEDICATIONS: 10,000 units heparin , intravenous ANESTHESIA/SEDATION: Moderate (  conscious) sedation was employed during this procedure. A total of Versed  3 mg and Fentanyl  0 mcg was administered intravenously. Moderate Sedation Time: 97 minutes. The patient's level of consciousness and vital signs were monitored continuously by radiology nursing throughout the procedure under my direct supervision. FLUOROSCOPY TIME:  Nine hundred sixteen mGy reference air kerma COMPLICATIONS: None immediate. TECHNIQUE: Informed written consent was obtained from the patient after a thorough discussion of the procedural risks, benefits and alternatives. All questions were addressed. Maximal Sterile Barrier Technique was utilized including caps, mask, sterile gowns, sterile gloves, sterile drape, hand hygiene and skin antiseptic. A timeout was performed prior to the initiation of the procedure. Preprocedure ultrasound evaluation demonstrated patency of the right common femoral vein. The procedure was planned. Subdermal Local anesthesia was administered 1% lidocaine . A small skin nick was made. Under direct ultrasound visualization, the right common femoral vein was accessed with a 21 gauge micropuncture needle. A permanent ultrasound image was captured and stored in the record. Micropuncture sheath was inserted followed by placement of a Wholey wire was directed to the inferior vena cava under fluoroscopic guidance. An 8 [place] vascular sheath was placed. A pigtail catheter was inserted to the peripheral inferior vena cava and inferior vena cavagram was performed. Inferior vena cavagram was significant for presence of an infrarenal IVC filter with possible nonocclusive thrombus about the apex of the filter with sluggish antegrade flow.  Given failure of anticoagulation and indwelling IVC filter with presence of thrombus, decision was made to remove the indwelling filter. Therefore, the right internal jugular vein was accessed with a 21 gauge micropuncture needle. A permanent ultrasound image was captured and stored in the record. A micropuncture sheath was introduced through which a Wholey wire was advanced to the peripheral inferior vena cava. Serial dilation was performed followed by introduction of an argon triple loop snare IVC filter retrieval coaxial kit. The snare was then used to capture the apex of the filter which was retrieved without complication. Repeat inferior vena cavagram was performed which demonstrated patency of the normal caliber inferior vena cava without evidence of filling defect or extravasation. Attention was then turned toward PE thrombectomy. In pre close fashion, 2 ProGlides at the 10 o'clock and 2 o'clock positions. Over the wire, a 24 [place] Inari sheath was then placed and directed to the inferior vena cava. Over the wire, a double angle pigtail catheter was inserted and under fluoroscopic guidance was attempted to be directed through the right atrium and right ventricle to the main pulmonary artery, however due to the size of the right heart this was difficult. Therefore, a Swan-Ganz catheter was inserted with the balloon inflated which was floated to the main pulmonary artery. Central manometry was performed at this location with pulmonary arterial pressure of 70/38 with a mean of 45 mm Hg. The balloon was deflated and over a 0.025 guidewire the Swan-Ganz catheter was exchanged for a 4 French angled tip glide catheter. A Wholey wire was then inserted and directed into the left inferior pulmonary artery. The catheter was advanced to this location. The catheter was then exchanged for a 6 French angled tip select catheter. The wire was exchanged for a short taper superstiff Amplatz wire. The catheter was removed. A  24 [place] Flowtreiver aspiration catheter was then directed under fluoroscopic guidance to the main pulmonary artery. The inner dilator was removed. Pulmonary angiogram was then performed which demonstrated similar filling defects in the main and bilateral pulmonary arteries compatible with acute pulmonary embolism. The  aspiration catheter was then advanced to the left pulmonary artery. Multiple aspirations were performed which yielded a large volume of acute appearing thrombus. Left pulmonary angiogram was then repeated which demonstrated significantly improved patency and perfusion of the left lung. There is a small volume additional thrombus in the proximal left inferior lobar pulmonary artery. Therefore, the T20 curved aspiration catheter was advanced in coaxial fashion and aspiration was performed which yielded additional small volume acute appearing thrombus. The coaxial system was then directed to the right pulmonary artery in the wire was inserted into the right inferior lobar pulmonary artery. Aspiration thrombectomy was then performed which yielded large volume acute appearing thrombus. The large volume thrombus was affixed to the distal tip of the aspiration catheter and required removal of the coaxial aspiration device over the wire and Ree insertion. At this point, wire access to the right pulmonary artery was lost. Given significant improvement in vital signs and large volume thrombus, the pulmonary artery was not selected for completion measurements. At this point, the patient's tachycardia improved and oxygen saturations improved. The catheters were removed. Repeat inferior vena cavagram was then performed for planning purposes which demonstrated patency and again no evidence of extravasation. Over the wire, an option elite IVC filter was placed in an infrarenal location under fluoroscopic visualization. The sheath was then removed and the ProGlides were tied and cut. There was insufficient  hemostasis, therefore a 0 Prolene pursestring stitch was placed about the right groin access site. Hemostasis was achieved at the right neck and a pressure dressing was applied. The patient tolerated the procedure well and was transferred to the ICU in good condition. IMPRESSION: 1. Inferior vena cavogram demonstrates indwelling inferior vena cava filter with possible adherent thrombus. 2. Technically successful inferior vena cava filter retrieval. 3. Successful bilateral pulmonary artery thrombectomy with removal of large volume acute appearing thrombus. 4. Technically successful inferior vena cava filter placement. Ester Sides, MD [facility] Radiology Electronically Signed   By: Ester Sides M.D.   On: 07/13/2024 08:49   IR THROMBECT PRIM MECH ADD (INCLU) MOD SED Result Date: 07/13/2024 INDICATION: [age]-year-old male with history of acute, high risk pulmonary embolism. EXAM: 1. Ultrasound-guided vascular access of the right common femoral vein. 2. Inferior vena cavogram. 3. Ultrasound-guided vascular access of the right internal jugular vein. 4. Inferior vena cava filter retrieval. 5. Pulmonary angiogram. 6. Central manometry. 7. Bilateral pulmonary artery aspiration thrombectomy. 8. Inferior vena cava filter placement. COMPARISON:  CTA chest from earlier the same day MEDICATIONS: 10,000 units heparin , intravenous ANESTHESIA/SEDATION: Moderate (conscious) sedation was employed during this procedure. A total of Versed  3 mg and Fentanyl  0 mcg was administered intravenously. Moderate Sedation Time: 97 minutes. The patient's level of consciousness and vital signs were monitored continuously by radiology nursing throughout the procedure under my direct supervision. FLUOROSCOPY TIME:  Nine hundred sixteen mGy reference air kerma COMPLICATIONS: None immediate. TECHNIQUE: Informed written consent was obtained from the patient after a thorough discussion of the  procedural risks, benefits and alternatives. All questions were addressed. Maximal Sterile Barrier Technique was utilized including caps, mask, sterile gowns, sterile gloves, sterile drape, hand hygiene and skin antiseptic. A timeout was performed prior to the initiation of the procedure. Preprocedure ultrasound evaluation demonstrated patency of the right common femoral vein. The procedure was planned. Subdermal Local anesthesia was administered 1% lidocaine . A small skin nick was made. Under direct ultrasound visualization, the right common femoral vein was accessed with a 21 gauge micropuncture needle.  A permanent ultrasound image was captured and stored in the record. Micropuncture sheath was inserted followed by placement of a Wholey wire was directed to the inferior vena cava under fluoroscopic guidance. An 8 [place] vascular sheath was placed. A pigtail catheter was inserted to the peripheral inferior vena cava and inferior vena cavagram was performed. Inferior vena cavagram was significant for presence of an infrarenal IVC filter with possible nonocclusive thrombus about the apex of the filter with sluggish antegrade flow. Given failure of anticoagulation and indwelling IVC filter with presence of thrombus, decision was made to remove the indwelling filter. Therefore, the right internal jugular vein was accessed with a 21 gauge micropuncture needle. A permanent ultrasound image was captured and stored in the record. A micropuncture sheath was introduced through which a Wholey wire was advanced to the peripheral inferior vena cava. Serial dilation was performed followed by introduction of an argon triple loop snare IVC filter retrieval coaxial kit. The snare was then used to capture the apex of the filter which was retrieved without complication. Repeat inferior vena cavagram was performed which demonstrated patency of the normal caliber inferior vena cava without evidence of filling defect or extravasation.  Attention was then turned toward PE thrombectomy. In pre close fashion, 2 ProGlides at the 10 o'clock and 2 o'clock positions. Over the wire, a 24 [place] Inari sheath was then placed and directed to the inferior vena cava. Over the wire, a double angle pigtail catheter was inserted and under fluoroscopic guidance was attempted to be directed through the right atrium and right ventricle to the main pulmonary artery, however due to the size of the right heart this was difficult. Therefore, a Swan-Ganz catheter was inserted with the balloon inflated which was floated to the main pulmonary artery. Central manometry was performed at this location with pulmonary arterial pressure of 70/38 with a mean of 45 mm Hg. The balloon was deflated and over a 0.025 guidewire the Swan-Ganz catheter was exchanged for a 4 French angled tip glide catheter. A Wholey wire was then inserted and directed into the left inferior pulmonary artery. The catheter was advanced to this location. The catheter was then exchanged for a 6 French angled tip select catheter. The wire was exchanged for a short taper superstiff Amplatz wire. The catheter was removed. A 24 [place] Flowtreiver aspiration catheter was then directed under fluoroscopic guidance to the main pulmonary artery. The inner dilator was removed. Pulmonary angiogram was then performed which demonstrated similar filling defects in the main and bilateral pulmonary arteries compatible with acute pulmonary embolism. The aspiration catheter was then advanced to the left pulmonary artery. Multiple aspirations were performed which yielded a large volume of acute appearing thrombus. Left pulmonary angiogram was then repeated which demonstrated significantly improved patency and perfusion of the left lung. There is a small volume additional thrombus in the proximal left inferior lobar pulmonary artery. Therefore, the T20 curved aspiration catheter was advanced in coaxial fashion and aspiration  was performed which yielded additional small volume acute appearing thrombus. The coaxial system was then directed to the right pulmonary artery in the wire was inserted into the right inferior lobar pulmonary artery. Aspiration thrombectomy was then performed which yielded large volume acute appearing thrombus. The large volume thrombus was affixed to the distal tip of the aspiration catheter and required removal of the coaxial aspiration device over the wire and Ree insertion. At this point, wire access to the right pulmonary artery was lost. Given significant improvement in vital signs  and large volume thrombus, the pulmonary artery was not selected for completion measurements. At this point, the patient's tachycardia improved and oxygen saturations improved. The catheters were removed. Repeat inferior vena cavagram was then performed for planning purposes which demonstrated patency and again no evidence of extravasation. Over the wire, an option elite IVC filter was placed in an infrarenal location under fluoroscopic visualization. The sheath was then removed and the ProGlides were tied and cut. There was insufficient hemostasis, therefore a 0 Prolene pursestring stitch was placed about the right groin access site. Hemostasis was achieved at the right neck and a pressure dressing was applied. The patient tolerated the procedure well and was transferred to the ICU in good condition. IMPRESSION: 1. Inferior vena cavogram demonstrates indwelling inferior vena cava filter with possible adherent thrombus. 2. Technically successful inferior vena cava filter retrieval. 3. Successful bilateral pulmonary artery thrombectomy with removal of large volume acute appearing thrombus. 4. Technically successful inferior vena cava filter placement. Ester Sides, MD [facility] Radiology Electronically Signed   By: Ester Sides M.D.   On: 07/13/2024 08:49   IR THROMBECT PRIM MECH  ADD (INCLU) MOD SED Result Date: 07/13/2024 INDICATION: [age]-year-old male with history of acute, high risk pulmonary embolism. EXAM: 1. Ultrasound-guided vascular access of the right common femoral vein. 2. Inferior vena cavogram. 3. Ultrasound-guided vascular access of the right internal jugular vein. 4. Inferior vena cava filter retrieval. 5. Pulmonary angiogram. 6. Central manometry. 7. Bilateral pulmonary artery aspiration thrombectomy. 8. Inferior vena cava filter placement. COMPARISON:  CTA chest from earlier the same day MEDICATIONS: 10,000 units heparin , intravenous ANESTHESIA/SEDATION: Moderate (conscious) sedation was employed during this procedure. A total of Versed  3 mg and Fentanyl  0 mcg was administered intravenously. Moderate Sedation Time: 97 minutes. The patient's level of consciousness and vital signs were monitored continuously by radiology nursing throughout the procedure under my direct supervision. FLUOROSCOPY TIME:  Nine hundred sixteen mGy reference air kerma COMPLICATIONS: None immediate. TECHNIQUE: Informed written consent was obtained from the patient after a thorough discussion of the procedural risks, benefits and alternatives. All questions were addressed. Maximal Sterile Barrier Technique was utilized including caps, mask, sterile gowns, sterile gloves, sterile drape, hand hygiene and skin antiseptic. A timeout was performed prior to the initiation of the procedure. Preprocedure ultrasound evaluation demonstrated patency of the right common femoral vein. The procedure was planned. Subdermal Local anesthesia was administered 1% lidocaine . A small skin nick was made. Under direct ultrasound visualization, the right common femoral vein was accessed with a 21 gauge micropuncture needle. A permanent ultrasound image was captured and stored in the record. Micropuncture sheath was inserted followed by placement of a Wholey wire was directed to the inferior vena cava under fluoroscopic  guidance. An 8 [place] vascular sheath was placed. A pigtail catheter was inserted to the peripheral inferior vena cava and inferior vena cavagram was performed. Inferior vena cavagram was significant for presence of an infrarenal IVC filter with possible nonocclusive thrombus about the apex of the filter with sluggish antegrade flow. Given failure of anticoagulation and indwelling IVC filter with presence of thrombus, decision was made to remove the indwelling filter. Therefore, the right internal jugular vein was accessed with a 21 gauge micropuncture needle. A permanent ultrasound image was captured and stored in the record. A micropuncture sheath was introduced through which a Wholey wire was advanced to the peripheral inferior vena cava. Serial dilation was performed followed by introduction of an argon triple loop snare IVC  filter retrieval coaxial kit. The snare was then used to capture the apex of the filter which was retrieved without complication. Repeat inferior vena cavagram was performed which demonstrated patency of the normal caliber inferior vena cava without evidence of filling defect or extravasation. Attention was then turned toward PE thrombectomy. In pre close fashion, 2 ProGlides at the 10 o'clock and 2 o'clock positions. Over the wire, a 24 [place] Inari sheath was then placed and directed to the inferior vena cava. Over the wire, a double angle pigtail catheter was inserted and under fluoroscopic guidance was attempted to be directed through the right atrium and right ventricle to the main pulmonary artery, however due to the size of the right heart this was difficult. Therefore, a Swan-Ganz catheter was inserted with the balloon inflated which was floated to the main pulmonary artery. Central manometry was performed at this location with pulmonary arterial pressure of 70/38 with a mean of 45 mm Hg. The balloon was deflated and over a 0.025 guidewire the Swan-Ganz catheter was exchanged for a  4 French angled tip glide catheter. A Wholey wire was then inserted and directed into the left inferior pulmonary artery. The catheter was advanced to this location. The catheter was then exchanged for a 6 French angled tip select catheter. The wire was exchanged for a short taper superstiff Amplatz wire. The catheter was removed. A 24 [place] Flowtreiver aspiration catheter was then directed under fluoroscopic guidance to the main pulmonary artery. The inner dilator was removed. Pulmonary angiogram was then performed which demonstrated similar filling defects in the main and bilateral pulmonary arteries compatible with acute pulmonary embolism. The aspiration catheter was then advanced to the left pulmonary artery. Multiple aspirations were performed which yielded a large volume of acute appearing thrombus. Left pulmonary angiogram was then repeated which demonstrated significantly improved patency and perfusion of the left lung. There is a small volume additional thrombus in the proximal left inferior lobar pulmonary artery. Therefore, the T20 curved aspiration catheter was advanced in coaxial fashion and aspiration was performed which yielded additional small volume acute appearing thrombus. The coaxial system was then directed to the right pulmonary artery in the wire was inserted into the right inferior lobar pulmonary artery. Aspiration thrombectomy was then performed which yielded large volume acute appearing thrombus. The large volume thrombus was affixed to the distal tip of the aspiration catheter and required removal of the coaxial aspiration device over the wire and Ree insertion. At this point, wire access to the right pulmonary artery was lost. Given significant improvement in vital signs and large volume thrombus, the pulmonary artery was not selected for completion measurements. At this point, the patient's tachycardia improved and oxygen saturations improved. The catheters were removed. Repeat  inferior vena cavagram was then performed for planning purposes which demonstrated patency and again no evidence of extravasation. Over the wire, an option elite IVC filter was placed in an infrarenal location under fluoroscopic visualization. The sheath was then removed and the ProGlides were tied and cut. There was insufficient hemostasis, therefore a 0 Prolene pursestring stitch was placed about the right groin access site. Hemostasis was achieved at the right neck and a pressure dressing was applied. The patient tolerated the procedure well and was transferred to the ICU in good condition. IMPRESSION: 1. Inferior vena cavogram demonstrates indwelling inferior vena cava filter with possible adherent thrombus. 2. Technically successful inferior vena cava filter retrieval. 3. Successful bilateral pulmonary artery thrombectomy with removal of large volume acute appearing thrombus.  4. Technically successful inferior vena cava filter placement. Ester Sides, MD [facility] Radiology Electronically Signed   By: Ester Sides M.D.   On: 07/13/2024 08:49   IR Angiogram Pulmonary Bilateral Selective Result Date: 07/13/2024 INDICATION: [age]-year-old male with history of acute, high risk pulmonary embolism. EXAM: 1. Ultrasound-guided vascular access of the right common femoral vein. 2. Inferior vena cavogram. 3. Ultrasound-guided vascular access of the right internal jugular vein. 4. Inferior vena cava filter retrieval. 5. Pulmonary angiogram. 6. Central manometry. 7. Bilateral pulmonary artery aspiration thrombectomy. 8. Inferior vena cava filter placement. COMPARISON:  CTA chest from earlier the same day MEDICATIONS: 10,000 units heparin , intravenous ANESTHESIA/SEDATION: Moderate (conscious) sedation was employed during this procedure. A total of Versed  3 mg and Fentanyl  0 mcg was administered intravenously. Moderate Sedation Time: 97 minutes. The patient's level  of consciousness and vital signs were monitored continuously by radiology nursing throughout the procedure under my direct supervision. FLUOROSCOPY TIME:  Nine hundred sixteen mGy reference air kerma COMPLICATIONS: None immediate. TECHNIQUE: Informed written consent was obtained from the patient after a thorough discussion of the procedural risks, benefits and alternatives. All questions were addressed. Maximal Sterile Barrier Technique was utilized including caps, mask, sterile gowns, sterile gloves, sterile drape, hand hygiene and skin antiseptic. A timeout was performed prior to the initiation of the procedure. Preprocedure ultrasound evaluation demonstrated patency of the right common femoral vein. The procedure was planned. Subdermal Local anesthesia was administered 1% lidocaine . A small skin nick was made. Under direct ultrasound visualization, the right common femoral vein was accessed with a 21 gauge micropuncture needle. A permanent ultrasound image was captured and stored in the record. Micropuncture sheath was inserted followed by placement of a Wholey wire was directed to the inferior vena cava under fluoroscopic guidance. An 8 [place] vascular sheath was placed. A pigtail catheter was inserted to the peripheral inferior vena cava and inferior vena cavagram was performed. Inferior vena cavagram was significant for presence of an infrarenal IVC filter with possible nonocclusive thrombus about the apex of the filter with sluggish antegrade flow. Given failure of anticoagulation and indwelling IVC filter with presence of thrombus, decision was made to remove the indwelling filter. Therefore, the right internal jugular vein was accessed with a 21 gauge micropuncture needle. A permanent ultrasound image was captured and stored in the record. A micropuncture sheath was introduced through which a Wholey wire was advanced to the peripheral inferior vena cava. Serial dilation was performed followed by  introduction of an argon triple loop snare IVC filter retrieval coaxial kit. The snare was then used to capture the apex of the filter which was retrieved without complication. Repeat inferior vena cavagram was performed which demonstrated patency of the normal caliber inferior vena cava without evidence of filling defect or extravasation. Attention was then turned toward PE thrombectomy. In pre close fashion, 2 ProGlides at the 10 o'clock and 2 o'clock positions. Over the wire, a 24 [place] Inari sheath was then placed and directed to the inferior vena cava. Over the wire, a double angle pigtail catheter was inserted and under fluoroscopic guidance was attempted to be directed through the right atrium and right ventricle to the main pulmonary artery, however due to the size of the right heart this was difficult. Therefore, a Swan-Ganz catheter was inserted with the balloon inflated which was floated to the main pulmonary artery. Central manometry was performed at this location with pulmonary arterial pressure of 70/38 with a mean of 45  mm Hg. The balloon was deflated and over a 0.025 guidewire the Swan-Ganz catheter was exchanged for a 4 French angled tip glide catheter. A Wholey wire was then inserted and directed into the left inferior pulmonary artery. The catheter was advanced to this location. The catheter was then exchanged for a 6 French angled tip select catheter. The wire was exchanged for a short taper superstiff Amplatz wire. The catheter was removed. A 24 [place] Flowtreiver aspiration catheter was then directed under fluoroscopic guidance to the main pulmonary artery. The inner dilator was removed. Pulmonary angiogram was then performed which demonstrated similar filling defects in the main and bilateral pulmonary arteries compatible with acute pulmonary embolism. The aspiration catheter was then advanced to the left pulmonary artery. Multiple aspirations were performed which yielded a large volume of  acute appearing thrombus. Left pulmonary angiogram was then repeated which demonstrated significantly improved patency and perfusion of the left lung. There is a small volume additional thrombus in the proximal left inferior lobar pulmonary artery. Therefore, the T20 curved aspiration catheter was advanced in coaxial fashion and aspiration was performed which yielded additional small volume acute appearing thrombus. The coaxial system was then directed to the right pulmonary artery in the wire was inserted into the right inferior lobar pulmonary artery. Aspiration thrombectomy was then performed which yielded large volume acute appearing thrombus. The large volume thrombus was affixed to the distal tip of the aspiration catheter and required removal of the coaxial aspiration device over the wire and Ree insertion. At this point, wire access to the right pulmonary artery was lost. Given significant improvement in vital signs and large volume thrombus, the pulmonary artery was not selected for completion measurements. At this point, the patient's tachycardia improved and oxygen saturations improved. The catheters were removed. Repeat inferior vena cavagram was then performed for planning purposes which demonstrated patency and again no evidence of extravasation. Over the wire, an option elite IVC filter was placed in an infrarenal location under fluoroscopic visualization. The sheath was then removed and the ProGlides were tied and cut. There was insufficient hemostasis, therefore a 0 Prolene pursestring stitch was placed about the right groin access site. Hemostasis was achieved at the right neck and a pressure dressing was applied. The patient tolerated the procedure well and was transferred to the ICU in good condition. IMPRESSION: 1. Inferior vena cavogram demonstrates indwelling inferior vena cava filter with possible adherent thrombus. 2. Technically successful inferior vena cava filter retrieval. 3. Successful  bilateral pulmonary artery thrombectomy with removal of large volume acute appearing thrombus. 4. Technically successful inferior vena cava filter placement. Ester Sides, MD [facility] Radiology Electronically Signed   By: Ester Sides M.D.   On: 07/13/2024 08:49   IR Angiogram Selective Each Additional Vessel Result Date: 07/13/2024 INDICATION: [age]-year-old male with history of acute, high risk pulmonary embolism. EXAM: 1. Ultrasound-guided vascular access of the right common femoral vein. 2. Inferior vena cavogram. 3. Ultrasound-guided vascular access of the right internal jugular vein. 4. Inferior vena cava filter retrieval. 5. Pulmonary angiogram. 6. Central manometry. 7. Bilateral pulmonary artery aspiration thrombectomy. 8. Inferior vena cava filter placement. COMPARISON:  CTA chest from earlier the same day MEDICATIONS: 10,000 units heparin , intravenous ANESTHESIA/SEDATION: Moderate (conscious) sedation was employed during this procedure. A total of Versed  3 mg and Fentanyl  0 mcg was administered intravenously. Moderate Sedation Time: 97 minutes. The patient's level of consciousness and vital signs were monitored continuously by radiology nursing throughout the procedure under  my direct supervision. FLUOROSCOPY TIME:  Nine hundred sixteen mGy reference air kerma COMPLICATIONS: None immediate. TECHNIQUE: Informed written consent was obtained from the patient after a thorough discussion of the procedural risks, benefits and alternatives. All questions were addressed. Maximal Sterile Barrier Technique was utilized including caps, mask, sterile gowns, sterile gloves, sterile drape, hand hygiene and skin antiseptic. A timeout was performed prior to the initiation of the procedure. Preprocedure ultrasound evaluation demonstrated patency of the right common femoral vein. The procedure was planned. Subdermal Local anesthesia was administered 1% lidocaine .  A small skin nick was made. Under direct ultrasound visualization, the right common femoral vein was accessed with a 21 gauge micropuncture needle. A permanent ultrasound image was captured and stored in the record. Micropuncture sheath was inserted followed by placement of a Wholey wire was directed to the inferior vena cava under fluoroscopic guidance. An 8 [place] vascular sheath was placed. A pigtail catheter was inserted to the peripheral inferior vena cava and inferior vena cavagram was performed. Inferior vena cavagram was significant for presence of an infrarenal IVC filter with possible nonocclusive thrombus about the apex of the filter with sluggish antegrade flow. Given failure of anticoagulation and indwelling IVC filter with presence of thrombus, decision was made to remove the indwelling filter. Therefore, the right internal jugular vein was accessed with a 21 gauge micropuncture needle. A permanent ultrasound image was captured and stored in the record. A micropuncture sheath was introduced through which a Wholey wire was advanced to the peripheral inferior vena cava. Serial dilation was performed followed by introduction of an argon triple loop snare IVC filter retrieval coaxial kit. The snare was then used to capture the apex of the filter which was retrieved without complication. Repeat inferior vena cavagram was performed which demonstrated patency of the normal caliber inferior vena cava without evidence of filling defect or extravasation. Attention was then turned toward PE thrombectomy. In pre close fashion, 2 ProGlides at the 10 o'clock and 2 o'clock positions. Over the wire, a 24 [place] Inari sheath was then placed and directed to the inferior vena cava. Over the wire, a double angle pigtail catheter was inserted and under fluoroscopic guidance was attempted to be directed through the right atrium and right ventricle to the main pulmonary artery, however due to the size of the right heart this  was difficult. Therefore, a Swan-Ganz catheter was inserted with the balloon inflated which was floated to the main pulmonary artery. Central manometry was performed at this location with pulmonary arterial pressure of 70/38 with a mean of 45 mm Hg. The balloon was deflated and over a 0.025 guidewire the Swan-Ganz catheter was exchanged for a 4 French angled tip glide catheter. A Wholey wire was then inserted and directed into the left inferior pulmonary artery. The catheter was advanced to this location. The catheter was then exchanged for a 6 French angled tip select catheter. The wire was exchanged for a short taper superstiff Amplatz wire. The catheter was removed. A 24 [place] Flowtreiver aspiration catheter was then directed under fluoroscopic guidance to the main pulmonary artery. The inner dilator was removed. Pulmonary angiogram was then performed which demonstrated similar filling defects in the main and bilateral pulmonary arteries compatible with acute pulmonary embolism. The aspiration catheter was then advanced to the left pulmonary artery. Multiple aspirations were performed which yielded a large volume of acute appearing thrombus. Left pulmonary angiogram was then repeated which demonstrated significantly improved patency and perfusion of the left lung. There is a  small volume additional thrombus in the proximal left inferior lobar pulmonary artery. Therefore, the T20 curved aspiration catheter was advanced in coaxial fashion and aspiration was performed which yielded additional small volume acute appearing thrombus. The coaxial system was then directed to the right pulmonary artery in the wire was inserted into the right inferior lobar pulmonary artery. Aspiration thrombectomy was then performed which yielded large volume acute appearing thrombus. The large volume thrombus was affixed to the distal tip of the aspiration catheter and required removal of the coaxial aspiration device over the wire and  Ree insertion. At this point, wire access to the right pulmonary artery was lost. Given significant improvement in vital signs and large volume thrombus, the pulmonary artery was not selected for completion measurements. At this point, the patient's tachycardia improved and oxygen saturations improved. The catheters were removed. Repeat inferior vena cavagram was then performed for planning purposes which demonstrated patency and again no evidence of extravasation. Over the wire, an option elite IVC filter was placed in an infrarenal location under fluoroscopic visualization. The sheath was then removed and the ProGlides were tied and cut. There was insufficient hemostasis, therefore a 0 Prolene pursestring stitch was placed about the right groin access site. Hemostasis was achieved at the right neck and a pressure dressing was applied. The patient tolerated the procedure well and was transferred to the ICU in good condition. IMPRESSION: 1. Inferior vena cavogram demonstrates indwelling inferior vena cava filter with possible adherent thrombus. 2. Technically successful inferior vena cava filter retrieval. 3. Successful bilateral pulmonary artery thrombectomy with removal of large volume acute appearing thrombus. 4. Technically successful inferior vena cava filter placement. Ester Sides, MD [facility] Radiology Electronically Signed   By: Ester Sides M.D.   On: 07/13/2024 08:49   IR Angiogram Selective Each Additional Vessel Result Date: 07/13/2024 INDICATION: [age]-year-old male with history of acute, high risk pulmonary embolism. EXAM: 1. Ultrasound-guided vascular access of the right common femoral vein. 2. Inferior vena cavogram. 3. Ultrasound-guided vascular access of the right internal jugular vein. 4. Inferior vena cava filter retrieval. 5. Pulmonary angiogram. 6. Central manometry. 7. Bilateral pulmonary artery aspiration thrombectomy. 8. Inferior  vena cava filter placement. COMPARISON:  CTA chest from earlier the same day MEDICATIONS: 10,000 units heparin , intravenous ANESTHESIA/SEDATION: Moderate (conscious) sedation was employed during this procedure. A total of Versed  3 mg and Fentanyl  0 mcg was administered intravenously. Moderate Sedation Time: 97 minutes. The patient's level of consciousness and vital signs were monitored continuously by radiology nursing throughout the procedure under my direct supervision. FLUOROSCOPY TIME:  Nine hundred sixteen mGy reference air kerma COMPLICATIONS: None immediate. TECHNIQUE: Informed written consent was obtained from the patient after a thorough discussion of the procedural risks, benefits and alternatives. All questions were addressed. Maximal Sterile Barrier Technique was utilized including caps, mask, sterile gowns, sterile gloves, sterile drape, hand hygiene and skin antiseptic. A timeout was performed prior to the initiation of the procedure. Preprocedure ultrasound evaluation demonstrated patency of the right common femoral vein. The procedure was planned. Subdermal Local anesthesia was administered 1% lidocaine . A small skin nick was made. Under direct ultrasound visualization, the right common femoral vein was accessed with a 21 gauge micropuncture needle. A permanent ultrasound image was captured and stored in the record. Micropuncture sheath was inserted followed by placement of a Wholey wire was directed to the inferior vena cava under fluoroscopic guidance. An 8 [place] vascular sheath was placed. A pigtail catheter was inserted to  the peripheral inferior vena cava and inferior vena cavagram was performed. Inferior vena cavagram was significant for presence of an infrarenal IVC filter with possible nonocclusive thrombus about the apex of the filter with sluggish antegrade flow. Given failure of anticoagulation and indwelling IVC filter with presence of thrombus, decision was made to remove the indwelling  filter. Therefore, the right internal jugular vein was accessed with a 21 gauge micropuncture needle. A permanent ultrasound image was captured and stored in the record. A micropuncture sheath was introduced through which a Wholey wire was advanced to the peripheral inferior vena cava. Serial dilation was performed followed by introduction of an argon triple loop snare IVC filter retrieval coaxial kit. The snare was then used to capture the apex of the filter which was retrieved without complication. Repeat inferior vena cavagram was performed which demonstrated patency of the normal caliber inferior vena cava without evidence of filling defect or extravasation. Attention was then turned toward PE thrombectomy. In pre close fashion, 2 ProGlides at the 10 o'clock and 2 o'clock positions. Over the wire, a 24 [place] Inari sheath was then placed and directed to the inferior vena cava. Over the wire, a double angle pigtail catheter was inserted and under fluoroscopic guidance was attempted to be directed through the right atrium and right ventricle to the main pulmonary artery, however due to the size of the right heart this was difficult. Therefore, a Swan-Ganz catheter was inserted with the balloon inflated which was floated to the main pulmonary artery. Central manometry was performed at this location with pulmonary arterial pressure of 70/38 with a mean of 45 mm Hg. The balloon was deflated and over a 0.025 guidewire the Swan-Ganz catheter was exchanged for a 4 French angled tip glide catheter. A Wholey wire was then inserted and directed into the left inferior pulmonary artery. The catheter was advanced to this location. The catheter was then exchanged for a 6 French angled tip select catheter. The wire was exchanged for a short taper superstiff Amplatz wire. The catheter was removed. A 24 [place] Flowtreiver aspiration catheter was then directed under fluoroscopic guidance to the main pulmonary artery. The inner  dilator was removed. Pulmonary angiogram was then performed which demonstrated similar filling defects in the main and bilateral pulmonary arteries compatible with acute pulmonary embolism. The aspiration catheter was then advanced to the left pulmonary artery. Multiple aspirations were performed which yielded a large volume of acute appearing thrombus. Left pulmonary angiogram was then repeated which demonstrated significantly improved patency and perfusion of the left lung. There is a small volume additional thrombus in the proximal left inferior lobar pulmonary artery. Therefore, the T20 curved aspiration catheter was advanced in coaxial fashion and aspiration was performed which yielded additional small volume acute appearing thrombus. The coaxial system was then directed to the right pulmonary artery in the wire was inserted into the right inferior lobar pulmonary artery. Aspiration thrombectomy was then performed which yielded large volume acute appearing thrombus. The large volume thrombus was affixed to the distal tip of the aspiration catheter and required removal of the coaxial aspiration device over the wire and Ree insertion. At this point, wire access to the right pulmonary artery was lost. Given significant improvement in vital signs and large volume thrombus, the pulmonary artery was not selected for completion measurements. At this point, the patient's tachycardia improved and oxygen saturations improved. The catheters were removed. Repeat inferior vena cavagram was then performed for planning purposes which demonstrated patency and again no evidence  of extravasation. Over the wire, an option elite IVC filter was placed in an infrarenal location under fluoroscopic visualization. The sheath was then removed and the ProGlides were tied and cut. There was insufficient hemostasis, therefore a 0 Prolene pursestring stitch was placed about the right groin access site. Hemostasis was achieved at the right  neck and a pressure dressing was applied. The patient tolerated the procedure well and was transferred to the ICU in good condition. IMPRESSION: 1. Inferior vena cavogram demonstrates indwelling inferior vena cava filter with possible adherent thrombus. 2. Technically successful inferior vena cava filter retrieval. 3. Successful bilateral pulmonary artery thrombectomy with removal of large volume acute appearing thrombus. 4. Technically successful inferior vena cava filter placement. Ester Sides, MD [facility] Radiology Electronically Signed   By: Ester Sides M.D.   On: 07/13/2024 08:49   CT CERVICAL SPINE WO CONTRAST Result Date: 07/12/2024 CLINICAL DATA:  Blunt poly trauma. EXAM: CT CERVICAL SPINE WITHOUT CONTRAST TECHNIQUE: Multidetector CT imaging of the cervical spine was performed without intravenous contrast. Multiplanar CT image reconstructions were also generated. RADIATION DOSE REDUCTION: This exam was performed according to the departmental dose-optimization program which includes automated exposure control, adjustment of the mA and/or kV according to patient size and/or use of iterative reconstruction technique. COMPARISON:  None Available. FINDINGS: Alignment: Straightening of normal lordosis. No traumatic subluxation. Skull base and vertebrae: No acute fracture. Vertebral body heights are maintained. The dens and skull base are intact. Soft tissues and spinal canal: No prevertebral fluid or swelling. No visible canal hematoma. Disc levels: Anterior and posterior spurring extend from C4-C5 through a C7-T1. Mild multilevel facet hypertrophy. Spinal canal narrowing at C4-C5 and C6-C7. Upper chest: Assessed on concurrent chest CT, reported separately. Other: None. IMPRESSION: Degenerative change in the cervical spine without acute fracture or subluxation. Electronically Signed   By: Andrea Gasman M.D.   On: 07/12/2024 18:48   CT Angio  Chest/Abd/Pel for Dissection W and/or Wo Contrast Result Date: 07/12/2024 CLINICAL DATA:  Provided history: Acute aortic syndrome (AAS) suspected Found unresponsive on floor.  Hypotensive. EXAM: CT ANGIOGRAPHY CHEST, ABDOMEN AND PELVIS TECHNIQUE: Non-contrast CT of the chest was initially obtained. Multidetector CT imaging through the chest, abdomen and pelvis was performed using the standard protocol during bolus administration of intravenous contrast. Multiplanar reconstructed images and MIPs were obtained and reviewed to evaluate the vascular anatomy. RADIATION DOSE REDUCTION: This exam was performed according to the departmental dose-optimization program which includes automated exposure control, adjustment of the mA and/or kV according to patient size and/or use of iterative reconstruction technique. CONTRAST:  OMNIPAQUE  IOHEXOL  350 MG/ML SOLN COMPARISON:  Chest CTA 07/07/2024, abdominopelvic venogram 05/21/2021 FINDINGS: CTA CHEST FINDINGS Cardiovascular: No aortic hematoma on noncontrast exam. No dissection or evidence of acute aortic injury. Mild aortic atherosclerosis. Acute bilateral pulmonary emboli that are new from prior exam. Small saddle embolus is new, with large clot burden in the distal main pulmonary arteries extending into many lobar and segmental branches. 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 There is right heart strain with RV to LV ratio of 2. The heart is upper normal in size. There is no pericardial effusion. Mediastinum/Nodes: No mediastinal hemorrhage or hematoma. Small mediastinal lymph nodes are not enlarged by size criteria. Decompressed esophagus. No pneumomediastinum. No visible thyroid  nodule. Lungs/Pleura: No pneumothorax or pulmonary contusion. Mild heterogeneous pulmonary parenchyma. Scattered areas of scarring and subpleural reticulation, unchanged from recent  exam. No evidence of pulmonary  infarct or focal airspace disease. No pleural fluid. Trachea and central airways are clear. Musculoskeletal: No acute fracture of the ribs, sternum, included clavicles or shoulder girdles. Diffuse degenerative change in the thoracic spine without thoracic spine fracture. Partial fusion of the posterior elements of T12-L1. No confluent body wall contusion. Review of the MIP images confirms the above findings. CTA ABDOMEN AND PELVIS FINDINGS VASCULAR Aorta: No aortic aneurysm or dissection. There is periaortic and retroperitoneal stranding at the L2 level, also at the level of IVC filter, but no discrete aortic injury. No aortic irregularity, aortic contours are smooth. Mild aortic atherosclerosis. Celiac: Patent without evidence of aneurysm, dissection, vasculitis or significant stenosis. SMA: Patent without evidence of aneurysm, dissection, vasculitis or significant stenosis. Renals: Both renal arteries are patent without evidence of aneurysm, dissection, vasculitis, fibromuscular dysplasia or significant stenosis. IMA: Patent without evidence of aneurysm, dissection, vasculitis or significant stenosis. Inflow: Patent without evidence of aneurysm, dissection, vasculitis or significant stenosis. Veins: The venous structures are not well assessed on this arterial phase exam. IVC filter in place. There is retroperitoneal stranding and edema at the level of the filter. Review of the MIP images confirms the above findings. NON-VASCULAR Hepatobiliary: Arterial phase imaging limits detailed assessment. The liver is enlarged with steatosis. There is no evidence of perihepatic hematoma or focal liver abnormality. No evidence of liver injury. Unremarkable gallbladder. Pancreas: No evidence of pancreatic injury. No ductal dilatation or inflammation. Spleen: No perisplenic hematoma.  Normal arterial phase enhancement. Adrenals/Urinary Tract: No adrenal hemorrhage. No evidence of renal injury. No hydronephrosis. Nondistended  urinary bladder. Stomach/Bowel: No evidence of bowel injury or inflammation. No bowel wall thickening. Small to moderate colonic stool burden. Occasional colonic diverticula without diverticulitis. Lymphatic: No adenopathy. Reproductive: Prostate is unremarkable. Other: Retroperitoneal stranding adjacent to the aorta and IVC at the L2 level at the level of IVC filter. There is no other free fluid. No free air. Moderate fat containing umbilical hernia. No confluent body wall contusion. Musculoskeletal: No acute fracture of the pelvis or lumbar spine. Diffuse lumbar degenerative change. Review of the MIP images confirms the above findings. IMPRESSION: 1. Acute bilateral pulmonary emboli with large clot burden and right heart strain, (RV/LV Ratio = 2) consistent with at least submassive (intermediate risk) PE. The presence of right heart strain has been associated with an increased risk of morbidity and mortality. Please refer to the Code PE Focused order set in EPIC. 2. Retroperitoneal stranding adjacent to the abdominal aorta and IVC, at the level of IVC filter. There is no evidence of discrete abdominal aortic injury, however stranding etiology is indeterminate in etiology. The IVC is not well assessed on this venous phase exam. Consider a follow-up exam including venous phase imaging. 3. IVC filter in place. 4. No other evidence of acute traumatic injury to the chest, abdomen, or pelvis. 5. Hepatomegaly and hepatic steatosis. Aortic Atherosclerosis (ICD10-I70.0). Critical Value/emergent results were called by telephone at the time of interpretation on 07/12/2024 at 6:27 pm to Dr Lyndel, who verbally acknowledged these results. Electronically Signed   By: Andrea Gasman M.D.   On: 07/12/2024 18:46   CT HEAD WO CONTRAST Result Date: 07/12/2024 CLINICAL DATA:  Head trauma, found on floor with hematoma to back of head. Hypotensive. EXAM: CT HEAD WITHOUT CONTRAST TECHNIQUE: Contiguous axial images were  obtained from the base of the skull through the vertex without intravenous contrast. RADIATION DOSE REDUCTION: This exam was performed according to the departmental dose-optimization program  which includes automated exposure control, adjustment of the mA and/or kV according to patient size and/or use of iterative reconstruction technique. COMPARISON:  None Available. FINDINGS: Brain: No intracranial hemorrhage, mass effect, or midline shift. No hydrocephalus. The basilar cisterns are patent. No evidence of territorial infarct or acute ischemia. No extra-axial or intracranial fluid collection. Vascular: Atherosclerosis of skullbase vasculature without hyperdense vessel or abnormal calcification. Skull: No fracture or focal lesion. Sinuses/Orbits: No acute fracture or acute finding. Other: Mild right parietal scalp contusion. IMPRESSION: Mild right parietal scalp contusion. No acute intracranial abnormality. No skull fracture. Electronically Signed   By: Andrea Gasman M.D.   On: 07/12/2024 18:34   CT Angio Chest Pulmonary Embolism (PE) W or WO Contrast Result Date: 07/07/2024 CLINICAL DATA:  shortness of breath with hx of PE EXAM: CT ANGIOGRAPHY CHEST WITH CONTRAST TECHNIQUE: Multidetector CT imaging of the chest was performed using the standard protocol during bolus administration of intravenous contrast. Multiplanar CT image reconstructions and MIPs were obtained to evaluate the vascular anatomy. RADIATION DOSE REDUCTION: This exam was performed according to the departmental dose-optimization program which includes automated exposure control, adjustment of the mA and/or kV according to patient size and/or use of iterative reconstruction technique. CONTRAST:  75mL OMNIPAQUE  IOHEXOL  350 MG/ML SOLN COMPARISON:  May 20, 2023, May 24, 2021 FINDINGS: Pulmonary Embolism: No acute pulmonary embolism. Web-like filling defect within the proximal segmental right middle lobe branch (axial 77) and within the left lower  lobe pulmonary artery (axial 72), consistent with chronic pulmonary emboli changes. Cardiovascular: No cardiomegaly or pericardial effusion.Densely calcified LAD atherosclerosis.No aortic aneurysm. Scattered calcified aortic atherosclerosis. Mediastinum/Nodes: No mediastinal mass. No mediastinal, hilar, or axillary lymphadenopathy. Lungs/Pleura: The midline trachea and bronchi are patent. Posterior bibasilar dependent atelectasis. No focal airspace consolidation, pleural effusion, or pneumothorax. Mild pleuroparenchymal scarring in both lung apices. Minimal subpleural reticulation within the anterior aspects of both upper lobes, possibly postinfectious fibrosis in scarring. Musculoskeletal: No acute fracture or destructive bone lesion. Multilevel degenerative disc disease of the spine. Upper Abdomen: No acute abnormality in the partially visualized upper abdomen. Geographic hepatic steatosis. Review of the MIP images confirms the above findings. IMPRESSION: 1. No acute intrathoracic abnormality; specifically, no pulmonary embolism, pneumonia, or pleural effusion. 2. 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 3. Extensive, dense calcified atherosclerosis throughout the LAD. Aortic Atherosclerosis (ICD10-I70.0). Electronically Signed   By: Rogelia Myers M.D.   On: 07/07/2024 13:18     The total time spent in the appointment was 55 minutes encounter with patients including review of chart and various tests results, discussions about plan of care and coordination of care plan   All questions were answered. The patient knows to call the clinic with any problems, questions or concerns. No barriers to learning was detected.  Thank you for the courtesy of this consultation, Olam Travis Brunner, NP  8/6/20252:29 PM  ADDENDUM: I agree with the above assessment.  I really wonder if he was even taking anticoagulation.  I think when he came in, his INR was not him close to being  therapeutic.  There is not much else that we can do for him.  Again, questions whether or not he will take anticoagulation when he goes home.  I think the only positive hypercoagulable study was in the lupus anticoagulant.  That was 3 years ago.  Regardless of whether or not he has a lupus anticoagulant, he will need lifelong anticoagulation.  I am not sure exactly what he  has been on in the past.  Maybe, we might be able to utilize one of the newer anticoagulants.  Of note, he does have progressive thrombocytopenia.  This minor relative to hepatic steatosis.  It probably would not hurt to get a ultrasound of his abdomen to see what is going on.  He may have splenomegaly..  We will follow along.  Again, no much that we need to do right now.   Jeralyn Crease, MD   Proverbs 3:5-6

## 2024-07-14 ENCOUNTER — Inpatient Hospital Stay (HOSPITAL_COMMUNITY)

## 2024-07-14 ENCOUNTER — Telehealth: Payer: Self-pay | Admitting: Critical Care Medicine

## 2024-07-14 ENCOUNTER — Other Ambulatory Visit (HOSPITAL_COMMUNITY): Payer: Self-pay

## 2024-07-14 ENCOUNTER — Telehealth (HOSPITAL_COMMUNITY): Payer: Self-pay | Admitting: Pharmacy Technician

## 2024-07-14 ENCOUNTER — Telehealth: Payer: Self-pay

## 2024-07-14 DIAGNOSIS — E1165 Type 2 diabetes mellitus with hyperglycemia: Secondary | ICD-10-CM

## 2024-07-14 DIAGNOSIS — Z86711 Personal history of pulmonary embolism: Secondary | ICD-10-CM

## 2024-07-14 DIAGNOSIS — E785 Hyperlipidemia, unspecified: Secondary | ICD-10-CM | POA: Diagnosis not present

## 2024-07-14 DIAGNOSIS — I2602 Saddle embolus of pulmonary artery with acute cor pulmonale: Secondary | ICD-10-CM | POA: Diagnosis not present

## 2024-07-14 DIAGNOSIS — D62 Acute posthemorrhagic anemia: Secondary | ICD-10-CM

## 2024-07-14 DIAGNOSIS — N179 Acute kidney failure, unspecified: Secondary | ICD-10-CM | POA: Diagnosis not present

## 2024-07-14 LAB — CBC
HCT: 35.8 % — ABNORMAL LOW (ref 39.0–52.0)
Hemoglobin: 11.3 g/dL — ABNORMAL LOW (ref 13.0–17.0)
MCH: 32 pg (ref 26.0–34.0)
MCHC: 31.6 g/dL (ref 30.0–36.0)
MCV: 101.4 fL — ABNORMAL HIGH (ref 80.0–100.0)
Platelets: 53 K/uL — ABNORMAL LOW (ref 150–400)
RBC: 3.53 MIL/uL — ABNORMAL LOW (ref 4.22–5.81)
RDW: 15.8 % — ABNORMAL HIGH (ref 11.5–15.5)
WBC: 5.6 K/uL (ref 4.0–10.5)
nRBC: 0 % (ref 0.0–0.2)

## 2024-07-14 LAB — POCT ACTIVATED CLOTTING TIME: Activated Clotting Time: 170 s

## 2024-07-14 LAB — COMPREHENSIVE METABOLIC PANEL WITH GFR
ALT: 194 U/L — ABNORMAL HIGH (ref 0–44)
AST: 75 U/L — ABNORMAL HIGH (ref 15–41)
Albumin: 3.1 g/dL — ABNORMAL LOW (ref 3.5–5.0)
Alkaline Phosphatase: 68 U/L (ref 38–126)
Anion gap: 8 (ref 5–15)
BUN: 20 mg/dL (ref 6–20)
CO2: 20 mmol/L — ABNORMAL LOW (ref 22–32)
Calcium: 8.3 mg/dL — ABNORMAL LOW (ref 8.9–10.3)
Chloride: 109 mmol/L (ref 98–111)
Creatinine, Ser: 1 mg/dL (ref 0.61–1.24)
GFR, Estimated: >60 mL/min (ref >60)
Glucose, Bld: 174 mg/dL — ABNORMAL HIGH (ref 70–99)
Potassium: 4.4 mmol/L (ref 3.5–5.1)
Sodium: 137 mmol/L (ref 135–145)
Total Bilirubin: 0.5 mg/dL (ref 0.0–1.2)
Total Protein: 6.1 g/dL — ABNORMAL LOW (ref 6.5–8.1)

## 2024-07-14 LAB — PROTIME-INR
INR: 1.2 (ref 0.8–1.2)
Prothrombin Time: 15.4 s — ABNORMAL HIGH (ref 11.4–15.2)

## 2024-07-14 LAB — HEMOGLOBIN A1C
Hgb A1c MFr Bld: 8.4 % — ABNORMAL HIGH (ref 4.8–5.6)
Mean Plasma Glucose: 194 mg/dL

## 2024-07-14 LAB — HEPARIN LEVEL (UNFRACTIONATED): Heparin Unfractionated: 0.41 [IU]/mL (ref 0.30–0.70)

## 2024-07-14 LAB — GLUCOSE, CAPILLARY
Glucose-Capillary: 129 mg/dL — ABNORMAL HIGH (ref 70–99)
Glucose-Capillary: 153 mg/dL — ABNORMAL HIGH (ref 70–99)
Glucose-Capillary: 211 mg/dL — ABNORMAL HIGH (ref 70–99)
Glucose-Capillary: 165 mg/dL — ABNORMAL HIGH (ref 70–99)

## 2024-07-14 LAB — TYPE AND SCREEN
ABO/RH(D): O POS
Antibody Screen: NEGATIVE

## 2024-07-14 MED ORDER — ENOXAPARIN SODIUM 100 MG/ML IJ SOSY
100.0000 mg | PREFILLED_SYRINGE | Freq: Two times a day (BID) | INTRAMUSCULAR | Status: DC
  Administered 2024-07-14 – 2024-07-21 (×21): 100 mg via SUBCUTANEOUS
  Filled 2024-07-14 (×15): qty 1

## 2024-07-14 MED ORDER — WARFARIN SODIUM 4 MG PO TABS
8.0000 mg | ORAL_TABLET | Freq: Once | ORAL | Status: AC
  Administered 2024-07-14: 8 mg via ORAL
  Filled 2024-07-14: qty 2

## 2024-07-14 MED ORDER — OXYCODONE HCL 5 MG PO TABS
5.0000 mg | ORAL_TABLET | Freq: Four times a day (QID) | ORAL | Status: DC | PRN
  Administered 2024-07-14 – 2024-07-20 (×17): 5 mg via ORAL
  Filled 2024-07-14 (×12): qty 1

## 2024-07-14 MED ORDER — LIDOCAINE 5 % EX PTCH
1.0000 | MEDICATED_PATCH | CUTANEOUS | Status: DC
  Administered 2024-07-14 – 2024-07-20 (×10): 1 via TRANSDERMAL
  Filled 2024-07-14 (×7): qty 1

## 2024-07-14 NOTE — Progress Notes (Signed)
 BLE venous exam is completed. Travis Rollins, RVT

## 2024-07-14 NOTE — Plan of Care (Signed)

## 2024-07-14 NOTE — Progress Notes (Signed)
 Referring Provider(s): Gretta Lapine   Supervising Physician: Jenna Hacker  Patient Status:  [facility] - In-pt  Chief Complaint:  Pulmonary embolism s/p pulmonary arteriogram and thrombectomy; IVC filter retrieval and placement Dr. Jennefer 07/12/24  Subjective:  Pt states he is feeling well today, some tenderness at right groin site. Family at bedside.  All pt questions answered.    Allergies: Penicillins  Medications: Prior to Admission medications   Medication Sig Start Date End Date Taking? Authorizing Provider  allopurinol (ZYLOPRIM) 100 MG tablet Take 100 mg by mouth daily.   Yes [provider]  atorvastatin  (LIPITOR) 80 MG tablet Take 80 mg by mouth daily. 04/26/24  Yes [provider]  Capsicum, Cayenne, 455 MG CAPS Take 2 tablets by mouth in the morning and at bedtime.   Yes [provider]  co-enzyme Q-10 30 MG capsule Take 30 mg by mouth daily.   Yes [provider]  ezetimibe  (ZETIA ) 10 MG tablet Take 10 mg by mouth daily.   Yes [provider]  Febuxostat 80 MG TABS Take 1 tablet by mouth daily. 04/26/24  Yes [provider]  furosemide  (LASIX ) 40 MG tablet Take 40 mg by mouth every morning.    Yes [provider]  L-ARGININE PO Take 1 tablet by mouth daily.   Yes [provider]  lisinopril  (PRINIVIL ,ZESTRIL ) 2.5 MG tablet Take 2.5 mg by mouth daily.   Yes [provider]  Magnesium 200 MG CHEW Chew 2 capsules by mouth in the morning and at bedtime.   Yes [provider]  metoprolol  succinate (TOPROL -XL) 50 MG 24 hr tablet Take 50 mg by mouth at bedtime. Take with or immediately following a meal.   Yes [provider]  omega-3 acid ethyl esters (LOVAZA) 1 g capsule Take 1 g by mouth daily.   Yes [provider]  potassium chloride  (K-DUR) 10 MEQ tablet Take 10 mEq by mouth at bedtime.    Yes [provider]  SYNJARDY XR 12.04-999 MG TB24 Take 1 tablet by  mouth 2 (two) times daily. 07/13/23  Yes [provider]  tamsulosin  (FLOMAX ) 0.4 MG CAPS capsule Take 0.4 mg by mouth daily after breakfast.   Yes [provider]  topiramate  (TOPAMAX ) 50 MG tablet Take 50 mg by mouth 2 (two) times daily. 04/26/24  Yes [provider]  TRULICITY 3 MG/0.5ML SOPN Inject 3 mg into the skin once a week. 04/13/23  Yes [provider]  Turmeric (QC TUMERIC COMPLEX) 500 MG CAPS Take 500 mg by mouth daily.   Yes [provider]  Ubiquinol 100 MG CAPS Take 1 capsule by mouth daily.   Yes [provider]  warfarin (COUMADIN ) 4 MG tablet Take 4 mg by mouth daily.   Yes [provider]  TRESIBA FLEXTOUCH 200 UNIT/ML SOPN Inject 26 Units into the skin at bedtime. 08/25/18   [provider]     Vital Signs: BP 127/82   Pulse 96   Temp 98.2 F (36.8 C) (Oral)   Resp 18   Ht 5' 6 (1.676 m)   Wt 225 lb 12 oz (102.4 kg)   SpO2 100%   BMI 36.44 kg/m   Physical Exam Constitutional:      Appearance: Normal appearance.  Skin:    Comments: Right common femoral access site purse-string suture intact  Skin clean, dry, without signs of infection  Hemostasis achieved   Neurological:     Mental Status: He is alert  and oriented to person, place, and time.  Psychiatric:        Mood and Affect: Mood normal.        Behavior: Behavior normal.      Labs:  CBC: Recent Labs    07/12/24 1803 07/12/24 1809 07/13/24 0105 07/13/24 1557 07/14/24 0604  WBC 5.2  --  4.0 5.3 5.6  HGB 14.4 16.0 13.8 11.8* 11.3*  HCT 46.5 47.0 43.9 37.5* 35.8*  PLT 67*  --  54* 55* 53*    COAGS: Recent Labs    07/07/24 1035 07/12/24 1803 07/14/24 0604  INR 1.2 1.3* 1.2    BMP: Recent Labs    07/07/24 1024 07/12/24 1803 07/12/24 1809 07/13/24 0105 07/14/24 1012  NA 138 138 141 140 137  K 4.7 4.6 4.0 5.5* 4.4  CL 112* 109 113* 112* 109  CO2 15* 16*  --  19* 20*  GLUCOSE 149* 241* 238* 208* 174*  BUN 24*  32* 32* 31* 20  CALCIUM  9.0 8.8*  --  8.5* 8.3*  CREATININE 1.06 1.76* 1.70* 1.38* 1.00  GFRNONAA >60 45*  --  60* >60    LIVER FUNCTION TESTS: Recent Labs    07/12/24 1803 07/14/24 1012  BILITOT 1.0 0.5  AST 360* 75*  ALT 311* 194*  ALKPHOS 85 68  PROT 6.5 6.1*  ALBUMIN 3.3* 3.1*    Assessment and Plan:  Pulmonary embolism s/p pulmonary arteriogram and thrombectomy; IVC filter retrieval and placement Dr. Jennefer 07/12/24  Pt seen today to assess right common femoral access site.    Right common femoral access site clean, dry, without signs of infection.  Dressed appropriately. Hemostasis achieved.    Using a suture removal kit, the right common femoral purse-string suture of the drain was cut and removed without complication. No bleeding was noted on removal of purse-string suture.    Some raw skin noted over skin at left groin site from panus friction, nurse aware and will continue to monitor and dress appropriately in order to avoid long term open wound.    The right common femoral access site was cleaned and dressed appropriately.    Please call IR with any questions.    Electronically Signed: Lavanda JAYSON Jurist, PA-C 07/14/2024, 1:34 PM    I spent a total of 15 Minutes at the the patient's bedside AND on the patient's hospital floor or unit, greater than 50% of which was counseling/coordinating care for pulmonary thrombectomy, IVC filter retrieval and replacement.

## 2024-07-14 NOTE — Progress Notes (Signed)
 eLink Physician-Brief Progress Note Patient Name: Travis Rollins DOB: 11-06-1966 MRN: [id number]   Date of Service  07/14/2024  HPI/Events of Note  58 y/o male with PMH for Obesity, DMT2 on insulin , HTN, DVT/PEs in the past on Coumadin  and s/p IVC filter, Depression - pain in the buttocks over hematoma.  eICU Interventions  Add lidocaine  patch, add oxycodone  PRN     Intervention Category Minor Interventions: Routine modifications to care plan (e.g. PRN medications for pain, fever)  Travis Rollins 07/14/2024, 8:42 PM

## 2024-07-14 NOTE — Telephone Encounter (Signed)
 Pharmacy Patient Advocate Encounter  Insurance verification completed.   The patient is insured through [employer] claim for WARFARIN. Currently a quantity of 30 is a 30 day supply and the co-pay is $0 . The current 30 day co-pay is, $0.  No PA needed at this time.  This test claim was processed through [facility]- copay amounts may vary at other pharmacies due to pharmacy/plan contracts, or as the patient moves through the different stages of their insurance plan.

## 2024-07-14 NOTE — Telephone Encounter (Signed)
 Previously, Hematology/oncology recommended transition from Lovenox  to Coumadin , since recurrent PEs have happened while on various DOAC's. Also, when the pt saw Dr Elmira on 09/25/23 he stated Continue follow-up with hematology/oncology. With this being a non-cardiac indication, would defer to hematology/oncology or outpatient primary care for follow-up post hospitalization.

## 2024-07-14 NOTE — Telephone Encounter (Signed)
 Patient scheduled.

## 2024-07-14 NOTE — Telephone Encounter (Signed)
 OP pulmonology follow up requested.  Travis SHAUNNA Gaskins, DO 07/14/24 11:33 AM  Pulmonary & Critical Care

## 2024-07-14 NOTE — Telephone Encounter (Signed)
-----   Message from Morna JONELLE Breach sent at 07/13/2024  9:30 AM EDT ----- Regarding: New Coumadin  Clinic Referral Good morning,   This is a patient's of Dr. Elmira who has been admitted to hospital for a recurrent PE with a possible history antiphospholipid syndrome on warfarin (INR goal 3.0-3.5). Dr. Gretta is wanting to refer patient for outpatient clinic follow-up. INR on admission was 1.3, restarting warfarin this admission.  If you need any additional information from me, please let me know.   Thank you,   Morna Breach, PharmD PGY2 Cardiology Pharmacy Resident 07/13/2024 9:39 AM

## 2024-07-14 NOTE — Progress Notes (Signed)
 NAME:  Travis Rollins, MRN:  [id number], DOB:  1966/11/17, LOS: 2 ADMISSION DATE:  07/12/2024, CONSULTATION DATE:  07/12/2024 REFERRING MD:  Mliss Boyers, MD, CHIEF COMPLAINT:  Syncopal Episode  History of Present Illness:  58 y/o male with PMH for Obesity, DMT2 on insulin , HTN, DVT/PEs in the past on Coumadin  and s/p IVC filter, Depression who presented as a level; I trauma after having a syncopal episode.  He says he went out of his apartment to do his laundry as he normally dose once a month to the laundry mat when he suddenly began to have SOB, light headedness, says it all went white.  He was found by a neighbor who then call his month with who he lives and mother called 911.  Hematoma to back of his head and BP 70/30, he was diaphoretic, and c/o chest pains.  He was brought to ED again as a Level I trauma, however, no head bleed and no signs of trauma.  He was found to have a saddle PE with right heart strian.  His INR was 1.3 and he has been compliant with his Coumadin .  He says he gets his INR checked once every 3 months.   His Troponin was 191, LA 3.5, Cr 1.70.  He was given 1 liter NS bolus and starting on Heparin  drip, noting Platelets of 67 (chronically low but not this low). Patient seen on 7/31 in ED for SOB and back pain and thought he had a PE at that time, but CTA then on 7/31 showed old Pes. Pertinent  Medical History   Obesity, DMT2 on insulin , HTN, DVT/Pes in the past on Coumadin , Depression  Significant Hospital Events: Including procedures, antibiotic start and stop dates in addition to other pertinent events   8/5:  Admit to ICU, mechanical thrombectomy with bilateral clot retrieved.   Interim History / Subjective:  Today he denies SOB, CP. Has some pain from buttock hematoma. Nurses found left posterior shoulder hematoma too.  Objective    Blood pressure (!) 91/57, pulse (!) 114, temperature 98.7 F (37.1 C), temperature source Oral, resp. rate (!) 22, height 5' 6  (1.676 m), weight 102.4 kg, SpO2 93%.        Intake/Output Summary (Last 24 hours) at 07/14/2024 0856 Last data filed at 07/14/2024 0800 Gross per 24 hour  Intake 278.4 ml  Output 2975 ml  Net -2696.6 ml   Filed Weights   07/13/24 0000 07/13/24 0402 07/14/24 0500  Weight: 102.6 kg 102.6 kg 102.4 kg    Examination: General:  middle aged man sitting up in bed in NAD HENT:  [place]/AT eyes anicteric  Lungs:  breathing comfortably on 2L , CTAB, no conversational dyspnea Cardiovascular: S1S2, RRR Abdomen: obese, soft, NT MSK: hematoma posterior left shoulder- smaller than the one on the buttocks Neuro: Awake, alert, moving extremities.  WBC 5.6 H/H 11.3/35.8 Platelets 53 INR 1.2 US  abd: coarse hepatic texture  Resolved problem list  Acute respiratory failure with hypoxia Lactic acidosis due to shock  Assessment and Plan  Cardiac syncope due to obstructive shock- saddle emboli, s/p mechanical thrombectomy Saddle pulmonary emboli; massive PE with 2 syncopal episodes before intervention. History of antiphospholipid Ab syndrome per patient, but no documented history of this in his chart. Subtherapeutic on coumadin . Troponin elevation due to PE.  -switch heparin  to lovenox ; will need to do levels to determine optimal dosing per pharmacy -coumadin  lovenox  -will plan for follow up with [facility]'s coumadin  clinic (follows with Dr. Patwardin).  Concerns about his previous follow up and coumadin  dosing.  -con't ambulating -Needs lifelong AC and to be therapeutic on coumadin .   -needs to keep up with age appropriate cancer screenings as OP  HLD -PTA statin, zetia   AKI due to massive PE, improving -strict I/O -recheck BMP   Controlled hyperglycemia, with uncontrolled DM2; A1c 8.4 -con't semglee  10 units daily -mealtime insulin  3 units TIDAC -SSI PRN -goal BG 140-180  HTN by history -con't holding PTA antihypertensives  Morbid Obesity -recommend long term weight  loss  Thrombocytopenia, chronic -Hematology consulted> appreciate their recommendations  BPH -con't PTA tamsulosin   Hyperkalemia -waiting on recheck  Anemia due to blood loss from hematomas- stable with ~2g drop this admission -ok to con't AC -con't daily CBC -no need acutely for transfusion  Hematoma R buttocks-- suspect this is traumatic from his syncopal episode at presentation  Stable to transfer to the floor today. TRH to assume care tomorrow. Barriers to extubation is [facility] plan-- need to ensure his adequate lovenox  dose and coumadin  dosing.   Best Practice (right click and Reselect all SmartList Selections daily)   Diet/type: Regular consistency (see orders) DVT prophylaxis systemic heparin , coumadin  Pressure ulcer(s): N/A GI prophylaxis: N/A Lines: N/A Foley:  N/A Code Status:  full code   Labs   CBC: Recent Labs  Lab 07/07/24 1024 07/12/24 1803 07/12/24 1809 07/13/24 0105 07/13/24 1557 07/14/24 0604  WBC 4.3 5.2  --  4.0 5.3 5.6  NEUTROABS 2.4  --   --   --   --   --   HGB 15.0 14.4 16.0 13.8 11.8* 11.3*  HCT 47.2 46.5 47.0 43.9 37.5* 35.8*  MCV 98.3 100.9*  --  100.5* 99.2 101.4*  PLT 79* 67*  --  54* 55* 53*    Basic Metabolic Panel: Recent Labs  Lab 07/07/24 1024 07/12/24 1803 07/12/24 1809 07/13/24 0105  NA 138 138 141 140  K 4.7 4.6 4.0 5.5*  CL 112* 109 113* 112*  CO2 15* 16*  --  19*  GLUCOSE 149* 241* 238* 208*  BUN 24* 32* 32* 31*  CREATININE 1.06 1.76* 1.70* 1.38*  CALCIUM  9.0 8.8*  --  8.5*  MG  --   --   --  2.2  PHOS  --   --   --  4.1   GFR: Estimated Creatinine Clearance: 66.2 mL/min (A) (by C-G formula based on SCr of 1.38 mg/dL (H)). Recent Labs  Lab 07/12/24 1803 07/12/24 1809 07/12/24 1858 07/13/24 0105 07/13/24 1557 07/14/24 0604  WBC 5.2  --   --  4.0 5.3 5.6  LATICACIDVEN  --  3.5* 3.6*  --   --   --     Liver Function Tests: Recent Labs  Lab 07/12/24 1803  AST 360*  ALT 311*  ALKPHOS 85  BILITOT 1.0   PROT 6.5  ALBUMIN 3.3*    Critical care time:      [name], DO 07/14/24 11:28 AM [facility]  For contact information, see Amion. If no response to pager, please call PCCM consult pager. After hours, 7PM- 7AM, please call [facility].

## 2024-07-14 NOTE — Progress Notes (Signed)
 ANTICOAGULATION CONSULT NOTE  Pharmacy Consult for Heparin  + warfarin Indication: pulmonary embolus  Patient Measurements: Height: 5' 6 (167.6 cm) Weight: 102.4 kg (225 lb 12 oz) IBW/kg (Calculated) : 63.8 Heparin  Dosing Weight: 90 kg  Vital Signs: Temp: 97.9 F (36.6 C) (08/07 0323) Temp Source: Axillary (08/07 0323) BP: 91/57 (08/07 0500) Pulse Rate: 114 (08/07 0700)  Labs: Recent Labs    07/12/24 1803 07/12/24 1803 07/12/24 1809 07/13/24 0105 07/13/24 0942 07/13/24 1557 07/13/24 1906 07/14/24 0604  HGB 14.4  --  16.0 13.8  --  11.8*  --  11.3*  HCT 46.5  --  47.0 43.9  --  37.5*  --  35.8*  PLT 67*  --   --  54*  --  55*  --  53*  LABPROT 16.6*  --   --   --   --   --   --  15.4*  INR 1.3*  --   --   --   --   --   --  1.2  HEPARINUNFRC  --    < >  --  >1.10* >1.10*  --  0.78* 0.41  CREATININE 1.76*  --  1.70* 1.38*  --   --   --   --   TROPONINIHS 191*  --   --  1,838*  --   --   --   --    < > = values in this interval not displayed.    Estimated Creatinine Clearance: 66.2 mL/min (A) (by C-G formula based on SCr of 1.38 mg/dL (H)).   Assessment: 41 yom with a history of DM, PE on warfarin and IVC in place, chronic DVT. Patient is presenting with fell and hypotension. Heparin  per pharmacy consult placed for pulmonary embolus. CTA w/ acute bilateral pulmonary emboli with large clot burden and right heart strain c/w submassive. PT/INR 16.6/1.3; Hgb 16; plt 67. Patient s/p IVC filter retrieval/replacement and thrombectomy on 07/12/24 PM.   PTA warfarin regimen: 4 mg daily INR goal range: 3.0-3.5 (for recurrent pulmonary embolisms)  07/14/24 AM: Heparin  level this morning 0.41, therapeutic. Hgb remains stable at 11.3, PLT stable low at 53. Noted patient still has right gluteal hematoma and new left shoulder hematoma overnight. Discussed with MD, plan to transition patient to enoxaparin  bridging while restarting warfarin. INR remains subtherapeutic at 1.2 after 8 mg x1  yesterday.   Goal of Therapy:  Anti-Xa level 0.6-1.0 4 hours after enoxaparin  dose INR Goal: 3.0-3.5 (for recurrent pulmonary embolisms) Monitor platelets by anticoagulation protocol: Yes   Plan:  Stop heparin , start enoxaparin  100 mg (~1 mg/kg) subcutaneously every 12 hours Consider checking anti-Xa level on 07/16/24 Warfarin 8 mg x1 Daily INR, H&H, platelets, and s/sx of bleeding  Morna Breach, PharmD PGY2 Cardiology Pharmacy Resident 07/14/2024 8:30 AM

## 2024-07-14 NOTE — Telephone Encounter (Signed)
 Patient Product/process development scientist completed.    The patient is insured through [facility]. Patient has Medicare and is not eligible for a copay card, but may be able to apply for patient assistance or Medicare RX Payment Plan (Patient Must reach out to their plan, if eligible for payment plan), if available.    Ran test claim for enoxaparin  (Lovenox ) 100 mg/ml and the current 30 day co-pay is $0.00.   This test claim was processed through [facility]- copay amounts may vary at other pharmacies due to pharmacy/plan contracts, or as the patient moves through the different stages of their insurance plan.     Reyes Sharps, CPHT Pharmacy Technician III Certified Patient Advocate [facility] Patient Advocate Team Direct Number: [phone]  Fax: [phone]

## 2024-07-15 ENCOUNTER — Encounter (HOSPITAL_COMMUNITY): Payer: Self-pay

## 2024-07-15 ENCOUNTER — Other Ambulatory Visit: Payer: Self-pay

## 2024-07-15 DIAGNOSIS — I2602 Saddle embolus of pulmonary artery with acute cor pulmonale: Secondary | ICD-10-CM | POA: Diagnosis not present

## 2024-07-15 DIAGNOSIS — I2692 Saddle embolus of pulmonary artery without acute cor pulmonale: Secondary | ICD-10-CM | POA: Diagnosis not present

## 2024-07-15 LAB — BASIC METABOLIC PANEL WITH GFR
Anion gap: 10 (ref 5–15)
BUN: 13 mg/dL (ref 6–20)
CO2: 23 mmol/L (ref 22–32)
Calcium: 8.3 mg/dL — ABNORMAL LOW (ref 8.9–10.3)
Chloride: 109 mmol/L (ref 98–111)
Creatinine, Ser: 1.04 mg/dL (ref 0.61–1.24)
GFR, Estimated: >60 mL/min (ref >60)
Glucose, Bld: 127 mg/dL — ABNORMAL HIGH (ref 70–99)
Potassium: 4.1 mmol/L (ref 3.5–5.1)
Sodium: 142 mmol/L (ref 135–145)

## 2024-07-15 LAB — PROTIME-INR
INR: 1.3 — ABNORMAL HIGH (ref 0.8–1.2)
Prothrombin Time: 17.4 s — ABNORMAL HIGH (ref 11.4–15.2)

## 2024-07-15 LAB — CBC
HCT: 34.6 % — ABNORMAL LOW (ref 39.0–52.0)
Hemoglobin: 11.1 g/dL — ABNORMAL LOW (ref 13.0–17.0)
MCH: 31.6 pg (ref 26.0–34.0)
MCHC: 32.1 g/dL (ref 30.0–36.0)
MCV: 98.6 fL (ref 80.0–100.0)
Platelets: 65 K/uL — ABNORMAL LOW (ref 150–400)
RBC: 3.51 MIL/uL — ABNORMAL LOW (ref 4.22–5.81)
RDW: 15.3 % (ref 11.5–15.5)
WBC: 5 K/uL (ref 4.0–10.5)
nRBC: 0 % (ref 0.0–0.2)

## 2024-07-15 LAB — GLUCOSE, CAPILLARY
Glucose-Capillary: 201 mg/dL — ABNORMAL HIGH (ref 70–99)
Glucose-Capillary: 171 mg/dL — ABNORMAL HIGH (ref 70–99)
Glucose-Capillary: 165 mg/dL — ABNORMAL HIGH (ref 70–99)
Glucose-Capillary: 134 mg/dL — ABNORMAL HIGH (ref 70–99)

## 2024-07-15 MED ORDER — WARFARIN SODIUM 4 MG PO TABS
8.0000 mg | ORAL_TABLET | Freq: Once | ORAL | Status: AC
  Administered 2024-07-15: 8 mg via ORAL
  Filled 2024-07-15: qty 2

## 2024-07-15 MED ORDER — ACETAMINOPHEN 325 MG PO TABS
650.0000 mg | ORAL_TABLET | Freq: Four times a day (QID) | ORAL | Status: DC | PRN
  Administered 2024-07-15 – 2024-07-20 (×4): 650 mg via ORAL
  Filled 2024-07-15 (×3): qty 2

## 2024-07-15 NOTE — Progress Notes (Signed)
 PROGRESS NOTE    REGINAL WOJCICKI  FMW:[id number] DOB: 1966/10/29 DOA: 07/12/2024 PCP: [name], NP   Brief Narrative:  58 year old male with history of obesity, diabetes mellitus type 2 on insulin , hypertension, DVT/PEs in the past on Coumadin  with history of IVC replacement, depression, hyperlipidemia presented as a level 1 trauma after having a syncopal episode.  On presentation, he was hypotensive, and was found to have saddle PE with right heart strain with INR of 1.3, troponin of 0.91, lactic acid of 3.5, creatinine of 1.7, platelets of 67.  He was started on heparin  drip and admitted to ICU under PCCM service.  He underwent mechanical thrombectomy with bilateral clot retrieval, IVC filter removal and placement by IR on 07/12/2024.  Hematology/oncology was also consulted.  He was switched to therapeutic dose of Lovenox  and Coumadin  started.  He has been transferred to TRH service from 07/14/2024 onwards.  Assessment & Plan:   Cardiac syncope due to obstructive shock: Present on admission Saddle pulmonary emboli/massive PE with 2 syncopal episodes Chronic DVT of left femoral vein with an acute on chronic DVT of popliteal vein and acute DVT of right gastrocnemius and right popliteal vein History of antiphospholipid antibody syndrome per patient, but no documented history of this in the chart Troponin elevation due to PE - He was started on heparin  drip and admitted to ICU under PCCM service.  He underwent mechanical thrombectomy with bilateral clot retrieval, IVC filter removal and placement by IR on 07/12/2024.  Hematology/oncology was also consulted.  He was switched to therapeutic dose of Lovenox  and Coumadin  started.  He has been transferred to TRH service from 07/14/2024 onwards. - INR still subtherapeutic at 1.3 today.  Pharmacy following.  Continue Lovenox  and Coumadin .  Will need follow-up with Coumadin  clinic.  Needs lifelong anticoagulation. - Ultrasound duplex showed bilateral lower  extremity DVTs as above  AKI due to massive PE and shock - Improved  Thrombocytopenia - No labs today.  Monitor.  No signs of bleeding.  Hypertension Hyperlipidemia - Continue statin and Zetia .  Blood pressure on the lower side.  Antihypertensives on hold.  Diabetes mellitus type 2 with hyperglycemia -Carb modified diet.  Continue long-acting insulin  along with CBGs with SSI and short acting insulin  with meals  BPH -Continue tamsulosin   Morbid obesity - Outpatient follow-up  Hyperkalemia - Resolved  Acute metabolic acidosis -improved  Acute blood loss anemia due to blood loss from hematoma - Hemoglobin currently stable.  Monitor intermittently  Macrocytosis -Questionable cause.  Right buttock hematoma - Possibly traumatic from his syncopal episode at presentation  Physical deconditioning - PT eval   DVT prophylaxis: Therapeutic Lovenox  and Coumadin  Code Status: Full Family Communication: None at bedside Disposition Plan: Status is: Inpatient Remains inpatient appropriate because: Of severity of illness  Consultants: PCCM/IR/oncology  Procedures: As above  Antimicrobials: None   Subjective: Patient seen and examined at bedside.  Complains of cough and buttock pain.  No fever, worsening abdominal pain or vomiting reported.  Objective: Vitals:   07/14/24 2046 07/15/24 0645 07/15/24 0715 07/15/24 0810  BP: 115/74 108/62  119/71  Pulse: 97 96  (!) 101  Resp: 18     Temp: 98.5 F (36.9 C) 97.7 F (36.5 C)  98.3 F (36.8 C)  TempSrc:  Oral  Oral  SpO2: 100% 99%  97%  Weight:   116.5 kg   Height:        Intake/Output Summary (Last 24 hours) at 07/15/2024 1015 Last data filed at 07/15/2024 0800  Gross per 24 hour  Intake 12 ml  Output 2325 ml  Net -2313 ml   Filed Weights   07/13/24 0402 07/14/24 0500 07/15/24 0715  Weight: 102.6 kg 102.4 kg 116.5 kg    Examination:  General exam: Appears calm and comfortable.  Currently on room  air. Respiratory system: Bilateral decreased breath sounds at bases with scattered crackles Cardiovascular system: S1 & S2 heard, Rate controlled Gastrointestinal system: Abdomen is morbidly obese, nondistended, soft and nontender. Normal bowel sounds heard. Extremities: No cyanosis, clubbing; bilateral lower extremity edema present Central nervous system: Alert and oriented.  Slow to respond.  No focal neurological deficits. Moving extremities Skin: No rashes, lesions or ulcers Psychiatry: Flat affect.  Not agitated.   Data Reviewed: I have personally reviewed following labs and imaging studies  CBC: Recent Labs  Lab 07/12/24 1803 07/12/24 1809 07/13/24 0105 07/13/24 1557 07/14/24 0604  WBC 5.2  --  4.0 5.3 5.6  HGB 14.4 16.0 13.8 11.8* 11.3*  HCT 46.5 47.0 43.9 37.5* 35.8*  MCV 100.9*  --  100.5* 99.2 101.4*  PLT 67*  --  54* 55* 53*   Basic Metabolic Panel: Recent Labs  Lab 07/12/24 1803 07/12/24 1809 07/13/24 0105 07/14/24 1012 07/15/24 0620  NA 138 141 140 137 142  K 4.6 4.0 5.5* 4.4 4.1  CL 109 113* 112* 109 109  CO2 16*  --  19* 20* 23  GLUCOSE 241* 238* 208* 174* 127*  BUN 32* 32* 31* 20 13  CREATININE 1.76* 1.70* 1.38* 1.00 1.04  CALCIUM  8.8*  --  8.5* 8.3* 8.3*  MG  --   --  2.2  --   --   PHOS  --   --  4.1  --   --    GFR: Estimated Creatinine Clearance: 94.1 mL/min (by C-G formula based on SCr of 1.04 mg/dL). Liver Function Tests: Recent Labs  Lab 07/12/24 1803 07/14/24 1012  AST 360* 75*  ALT 311* 194*  ALKPHOS 85 68  BILITOT 1.0 0.5  PROT 6.5 6.1*  ALBUMIN 3.3* 3.1*   No results for input(s): LIPASE, AMYLASE in the last 168 hours. No results for input(s): AMMONIA in the last 168 hours. Coagulation Profile: Recent Labs  Lab 07/12/24 1803 07/14/24 0604 07/15/24 0620  INR 1.3* 1.2 1.3*   Cardiac Enzymes: No results for input(s): CKTOTAL, CKMB, CKMBINDEX, TROPONINI in the last 168 hours. BNP (last 3 results) No results  for input(s): PROBNP in the last 8760 hours. HbA1C: Recent Labs    07/13/24 0105  HGBA1C 8.4*   CBG: Recent Labs  Lab 07/14/24 0604 07/14/24 1206 07/14/24 1653 07/14/24 2045 07/15/24 0759  GLUCAP 129* 153* 211* 165* 201*   Lipid Profile: No results for input(s): CHOL, HDL, LDLCALC, TRIG, CHOLHDL, LDLDIRECT in the last 72 hours. Thyroid  Function Tests: No results for input(s): TSH, T4TOTAL, FREET4, T3FREE, THYROIDAB in the last 72 hours. Anemia Panel: No results for input(s): VITAMINB12, FOLATE, FERRITIN, TIBC, IRON, RETICCTPCT in the last 72 hours. Sepsis Labs: Recent Labs  Lab 07/12/24 1809 07/12/24 1858  LATICACIDVEN 3.5* 3.6*    Recent Results (from the past 240 hours)  MRSA Next Gen by PCR, Nasal     Status: None   Collection Time: 07/12/24 11:55 PM   Specimen: Nasal Mucosa; Nasal Swab  Result Value Ref Range Status   MRSA by PCR Next Gen NOT DETECTED NOT DETECTED Final    Comment: (NOTE) The GeneXpert MRSA Assay (FDA approved for NASAL specimens only), is  one component of a comprehensive MRSA colonization surveillance program. It is not intended to diagnose MRSA infection nor to guide or monitor treatment for MRSA infections. Test performance is not FDA approved in patients less than 66 years old. Performed at [facility], [address], [place], [place] [address]          Radiology Studies: VAS US  LOWER EXTREMITY VENOUS (DVT) Result Date: 07/14/2024  Lower Venous DVT Study Patient Name:  AYRON FILLINGER  Date of Exam:   07/14/2024 Medical Rec #: [id number]           Accession #:    [id number] Date of Birth: [birth year]            Patient Gender: M Patient Age:   86 years Exam Location:  [facility] Procedure:      VAS US  LOWER EXTREMITY VENOUS (DVT) Referring Phys: JOHN PAYNE --------------------------------------------------------------------------------  Indications: Pulmonary embolism.  Risk Factors: Hx of  DVT and PE. Performing Technologist: Elmarie Lindau, RVT  Examination Guidelines: A complete evaluation includes B-mode imaging, spectral Doppler, color Doppler, and power Doppler as needed of all accessible portions of each vessel. Bilateral testing is considered an integral part of a complete examination. Limited examinations for reoccurring indications may be performed as noted. The reflux portion of the exam is performed with the patient in reverse Trendelenburg.  +---------+---------------+---------+-----------+----------+-------------------+ RIGHT    CompressibilityPhasicitySpontaneityPropertiesThrombus Aging      +---------+---------------+---------+-----------+----------+-------------------+ CFV                                                   bandage and pain    +---------+---------------+---------+-----------+----------+-------------------+ SFJ                                                   Not well visualized +---------+---------------+---------+-----------+----------+-------------------+ FV Prox  Full                                                             +---------+---------------+---------+-----------+----------+-------------------+ FV Mid   Full                                                             +---------+---------------+---------+-----------+----------+-------------------+ FV DistalFull                                                             +---------+---------------+---------+-----------+----------+-------------------+ PFV      Full                                                             +---------+---------------+---------+-----------+----------+-------------------+  POP      Partial                                      Age Indeterminate   +---------+---------------+---------+-----------+----------+-------------------+ PTV      Full                                                              +---------+---------------+---------+-----------+----------+-------------------+ PERO     Full                                                             +---------+---------------+---------+-----------+----------+-------------------+ Gastroc  None                                         Acute               +---------+---------------+---------+-----------+----------+-------------------+   +---------+---------------+---------+-----------+----------+-------------------+ LEFT     CompressibilityPhasicitySpontaneityPropertiesThrombus Aging      +---------+---------------+---------+-----------+----------+-------------------+ CFV      Full           Yes      Yes                                      +---------+---------------+---------+-----------+----------+-------------------+ SFJ      Full                                                             +---------+---------------+---------+-----------+----------+-------------------+ FV Prox  Full                                                             +---------+---------------+---------+-----------+----------+-------------------+ FV Mid   Partial                                      Chronic             +---------+---------------+---------+-----------+----------+-------------------+ FV DistalFull                                                             +---------+---------------+---------+-----------+----------+-------------------+ PFV      Full                                                             +---------+---------------+---------+-----------+----------+-------------------+  POP      Partial                                      acute on chronic    +---------+---------------+---------+-----------+----------+-------------------+ PTV      Full                                                             +---------+---------------+---------+-----------+----------+-------------------+  PERO                                                  Not well visualized +---------+---------------+---------+-----------+----------+-------------------+     Summary: RIGHT: - Findings consistent with acute deep vein thrombosis involving the gastrocnemius, and right popliteal vein.   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  *See table(s) above for measurements and observations. Electronically signed by Debby Robertson on 07/14/2024 at 5:14:16 PM.    Final    US  Abdomen Complete Result Date: 07/14/2024 CLINICAL DATA:  Thrombocytopenia. EXAM: ABDOMEN ULTRASOUND COMPLETE COMPARISON:  CT scan 07/12/2024 FINDINGS: Gallbladder: No gallstones or wall thickening visualized. No sonographic Murphy sign noted by sonographer. Common bile duct: Diameter: Could not be visualized. Liver: Coarsening of hepatic echotexture noted. Right hepatic lobe not well visualized. Portal vein is patent on color Doppler imaging with normal direction of blood flow towards the liver. IVC: Not well visualized due to body habitus, immobility, and bowel gas. Pancreas: Not well visualized due to body habitus, immobility, and bowel gas. Spleen: 10.3 cm craniocaudal length. Partially obscured by bowel gas. Right Kidney: Length: 11.4 cm. Echogenicity within normal limits. No mass or hydronephrosis visualized. Left Kidney: Length: 11.0 cm. Echogenicity within normal limits. No mass or hydronephrosis visualized. Abdominal aorta: Not well visualized due to body habitus, immobility, and bowel gas. Other findings: None. IMPRESSION: 1. Limited study due to body habitus, immobility, and bowel gas. 2. Coarsening of hepatic echotexture. Right hepatic lobe not well visualized. 3. No evidence for splenomegaly. Electronically Signed   By: Camellia Candle M.D.   On: 07/14/2024 09:13        Scheduled Meds:  atorvastatin   80 mg Oral Daily    Chlorhexidine  Gluconate Cloth  6 each Topical Daily   enoxaparin  (LOVENOX ) injection  100 mg Subcutaneous BID   ezetimibe   10 mg Oral Daily   insulin  aspart  0-15 Units Subcutaneous TID WC   insulin  aspart  0-5 Units Subcutaneous QHS   insulin  aspart  3 Units Subcutaneous TID WC   insulin  glargine-yfgn  10 Units Subcutaneous Daily   lidocaine   1 patch Transdermal Q24H   tamsulosin   0.4 mg Oral Daily   Warfarin - Pharmacist Dosing Inpatient   Does not apply q1600   Continuous Infusions:        Sophie Mao, MD [facility] 07/15/2024, 10:15 AM

## 2024-07-15 NOTE — Progress Notes (Signed)
 Travis Rollins is doing okay.  He is on Lovenox  right now.  His INR is still not therapeutic.  INR is only 1.3.  His platelets are 65,000.  Again, have to believe that the thrombocytopenia is secondary to his hepatic steatosis.  He has had no problems bleeding although he does have a hematoma in the right buttock area.  His electrolytes show sodium 142.  Potassium 4.1.  BUN 13 creatinine 1.04.  Calcium  8.3.  He had an echocardiogram 2 days ago.  He does have element of cardiomyopathy.  His ejection fraction is 45-50%.  His valves appear to be okay.  Again, I would clearly try to get his INR between 3-3.5.  This is good to be incredibly challenging to keep him at that level.  I suspect that at some point, he may need to consider being on injectable anticoagulation.  He is currently on Lovenox  for right now.  Because of his size, doing injectable anticoagulation might be a little bit tricky.  I know Arixtra would be dosed at 15 mg daily.  Hopefully, he will be able to maintain at therapeutic Coumadin  level.  I know that pharmacy is trying to get him to be therapeutic.   Jeralyn Crease, MD

## 2024-07-15 NOTE — Progress Notes (Signed)
 ANTICOAGULATION CONSULT NOTE  Pharmacy Consult for Heparin  + warfarin Indication: pulmonary embolus  Patient Measurements: Height: 5' 6 (167.6 cm) Weight: 116.5 kg (256 lb 14.4 oz) IBW/kg (Calculated) : 63.8 Heparin  Dosing Weight: 90 kg  Vital Signs: Temp: 98.3 F (36.8 C) (08/08 0810) Temp Source: Oral (08/08 0810) BP: 119/71 (08/08 0810) Pulse Rate: 101 (08/08 0810)  Labs: Recent Labs    07/12/24 1803 07/12/24 1809 07/13/24 0105 07/13/24 0942 07/13/24 1557 07/13/24 1906 07/14/24 0604 07/14/24 1012 07/15/24 0620 07/15/24 1235  HGB 14.4   < > 13.8  --  11.8*  --  11.3*  --   --  11.1*  HCT 46.5   < > 43.9  --  37.5*  --  35.8*  --   --  34.6*  PLT 67*  --  54*  --  55*  --  53*  --   --  65*  LABPROT 16.6*  --   --   --   --   --  15.4*  --  17.4*  --   INR 1.3*  --   --   --   --   --  1.2  --  1.3*  --   HEPARINUNFRC  --    < > >1.10* >1.10*  --  0.78* 0.41  --   --   --   CREATININE 1.76*   < > 1.38*  --   --   --   --  1.00 1.04  --   TROPONINIHS 191*  --  1,838*  --   --   --   --   --   --   --    < > = values in this interval not displayed.    Estimated Creatinine Clearance: 94.1 mL/min (by C-G formula based on SCr of 1.04 mg/dL).   Assessment: 77 yom with a history of DM, PE on warfarin and IVC in place, chronic DVT. Patient is presenting with fell and hypotension. Heparin  per pharmacy consult placed for pulmonary embolus. CTA w/ acute bilateral pulmonary emboli with large clot burden and right heart strain c/w submassive. Patient s/p IVC filter retrieval/replacement and thrombectomy on 07/12/24 PM.   PTA warfarin regimen: 4 mg daily INR goal range: 3.0-3.5 (for recurrent pulmonary embolisms)  07/15/24: Day 3 Warfarin. INR 1.3 Platelets 65  Goal of Therapy:  Anti-Xa level 0.6-1.0 4 hours after enoxaparin  dose INR Goal: 3.0-3.5 (for recurrent pulmonary embolisms) Monitor platelets by anticoagulation protocol: Yes   Plan:  Continue LMWH 100 mg BID, plan  for LMWH anti-Xa tomorrow Warfarin 8 mg x1 Daily INR, H&H, platelets, and s/sx of bleeding  Larraine Brazier, PharmD Clinical Pharmacist

## 2024-07-15 NOTE — Plan of Care (Signed)
  Problem: Education: Goal: Ability to describe self-care measures that may prevent or decrease complications (Diabetes Survival Skills Education) will improve Outcome: Progressing Goal: Individualized Educational Video(s) Outcome: Progressing   Problem: Coping: Goal: Ability to adjust to condition or change in health will improve Outcome: Progressing   Problem: Fluid Volume: Goal: Ability to maintain a balanced intake and output will improve Outcome: Progressing   Problem: Health Behavior/Discharge Planning: Goal: Ability to identify and utilize available resources and services will improve Outcome: Progressing Goal: Ability to manage health-related needs will improve Outcome: Progressing   Problem: Metabolic: Goal: Ability to maintain appropriate glucose levels will improve Outcome: Progressing   Problem: Nutritional: Goal: Maintenance of adequate nutrition will improve Outcome: Progressing Goal: Progress toward achieving an optimal weight will improve Outcome: Progressing   Problem: Skin Integrity: Goal: Risk for impaired skin integrity will decrease Outcome: Progressing   Problem: Tissue Perfusion: Goal: Adequacy of tissue perfusion will improve Outcome: Progressing   Problem: Education: Goal: Knowledge of General Education information will improve Description: Including pain rating scale, medication(s)/side effects and non-pharmacologic comfort measures Outcome: Progressing   Problem: Health Behavior/Discharge Planning: Goal: Ability to manage health-related needs will improve Outcome: Progressing   Problem: Clinical Measurements: Goal: Ability to maintain clinical measurements within normal limits will improve Outcome: Progressing Goal: Will remain free from infection Outcome: Progressing Goal: Diagnostic test results will improve Outcome: Progressing Goal: Respiratory complications will improve Outcome: Progressing Goal: Cardiovascular complication will  be avoided Outcome: Progressing   Problem: Clinical Measurements: Goal: Ability to maintain clinical measurements within normal limits will improve Outcome: Progressing Goal: Will remain free from infection Outcome: Progressing Goal: Diagnostic test results will improve Outcome: Progressing Goal: Respiratory complications will improve Outcome: Progressing Goal: Cardiovascular complication will be avoided Outcome: Progressing   Problem: Activity: Goal: Risk for activity intolerance will decrease Outcome: Progressing   Problem: Nutrition: Goal: Adequate nutrition will be maintained Outcome: Progressing   Problem: Coping: Goal: Level of anxiety will decrease Outcome: Progressing   Problem: Elimination: Goal: Will not experience complications related to bowel motility Outcome: Progressing Goal: Will not experience complications related to urinary retention Outcome: Progressing   Problem: Pain Managment: Goal: General experience of comfort will improve and/or be controlled Outcome: Progressing   Problem: Safety: Goal: Ability to remain free from injury will improve Outcome: Progressing   Problem: Skin Integrity: Goal: Risk for impaired skin integrity will decrease Outcome: Progressing

## 2024-07-16 DIAGNOSIS — I2602 Saddle embolus of pulmonary artery with acute cor pulmonale: Secondary | ICD-10-CM | POA: Diagnosis not present

## 2024-07-16 LAB — CBC WITH DIFFERENTIAL/PLATELET
Abs Immature Granulocytes: 0.04 K/uL (ref 0.00–0.07)
Basophils Absolute: 0 K/uL (ref 0.0–0.1)
Basophils Relative: 0 %
Eosinophils Absolute: 0.1 K/uL (ref 0.0–0.5)
Eosinophils Relative: 2 %
HCT: 31.2 % — ABNORMAL LOW (ref 39.0–52.0)
Hemoglobin: 10 g/dL — ABNORMAL LOW (ref 13.0–17.0)
Immature Granulocytes: 1 %
Lymphocytes Relative: 23 %
Lymphs Abs: 0.9 K/uL (ref 0.7–4.0)
MCH: 31.3 pg (ref 26.0–34.0)
MCHC: 32.1 g/dL (ref 30.0–36.0)
MCV: 97.8 fL (ref 80.0–100.0)
Monocytes Absolute: 0.6 K/uL (ref 0.1–1.0)
Monocytes Relative: 14 %
Neutro Abs: 2.4 K/uL (ref 1.7–7.7)
Neutrophils Relative %: 60 %
Platelets: 67 K/uL — ABNORMAL LOW (ref 150–400)
RBC: 3.19 MIL/uL — ABNORMAL LOW (ref 4.22–5.81)
RDW: 15.2 % (ref 11.5–15.5)
WBC: 4 K/uL (ref 4.0–10.5)
nRBC: 0 % (ref 0.0–0.2)

## 2024-07-16 LAB — BASIC METABOLIC PANEL WITH GFR
Anion gap: 8 (ref 5–15)
BUN: 12 mg/dL (ref 6–20)
CO2: 23 mmol/L (ref 22–32)
Calcium: 8.2 mg/dL — ABNORMAL LOW (ref 8.9–10.3)
Chloride: 104 mmol/L (ref 98–111)
Creatinine, Ser: 1.02 mg/dL (ref 0.61–1.24)
GFR, Estimated: >60 mL/min (ref >60)
Glucose, Bld: 133 mg/dL — ABNORMAL HIGH (ref 70–99)
Potassium: 3.9 mmol/L (ref 3.5–5.1)
Sodium: 135 mmol/L (ref 135–145)

## 2024-07-16 LAB — PROTIME-INR
INR: 1.5 — ABNORMAL HIGH (ref 0.8–1.2)
Prothrombin Time: 19.1 s — ABNORMAL HIGH (ref 11.4–15.2)

## 2024-07-16 LAB — GLUCOSE, CAPILLARY
Glucose-Capillary: 136 mg/dL — ABNORMAL HIGH (ref 70–99)
Glucose-Capillary: 149 mg/dL — ABNORMAL HIGH (ref 70–99)
Glucose-Capillary: 174 mg/dL — ABNORMAL HIGH (ref 70–99)
Glucose-Capillary: 159 mg/dL — ABNORMAL HIGH (ref 70–99)

## 2024-07-16 LAB — MAGNESIUM: Magnesium: 1.9 mg/dL (ref 1.7–2.4)

## 2024-07-16 LAB — HEPARIN ANTI-XA: Heparin LMW: 1.01 [IU]/mL

## 2024-07-16 MED ORDER — WARFARIN SODIUM 4 MG PO TABS
8.0000 mg | ORAL_TABLET | Freq: Once | ORAL | Status: AC
  Administered 2024-07-16: 8 mg via ORAL
  Filled 2024-07-16: qty 2

## 2024-07-16 MED ORDER — ORAL CARE MOUTH RINSE: 15.0000 mL | OROMUCOSAL | Status: DC | PRN

## 2024-07-16 NOTE — Evaluation (Signed)
 Occupational Therapy Evaluation Patient Details Name: Travis Rollins MRN: [id number] DOB: [birth year] Today's Date: 07/16/2024   History of Present Illness   58 year old male presented as a level 1 trauma after having a syncopal episode, R buttock hematoma, chronic DVT of BLEs, pulmonary embolism. with history of obesity, diabetes mellitus type 2 on insulin , hypertension, DVT/PEs in the past on Coumadin  with history of IVC replacement, depression, hyperlipidemia, hypertension, DMII, BPH, hyperkalemia, macrocytosis,     Clinical Impressions Pt c/o 10/10 pain to R  buttocks at rest, increases with activity. Pt lives with mother and two brothers in first floor apartment, level entry. PLOF independent. Pt currently limited due to pain in R bottom, mod A to assist to EOB, mod A for LB ADLs, able to stand with increased effort at [facility], take small steps to recliner. Pt has good BUE strength and ROM. Pt would benefit from [facility] and RW for return home, has difficulty standing from low surfaces due to pain. Will continue to see acutely to progress as able, HHOT recommended to maximize safety and functional independence in home setting.      If plan is discharge home, recommend the following:   A lot of help with walking and/or transfers;A little help with bathing/dressing/bathroom;Assistance with cooking/housework;Assist for transportation;Help with stairs or ramp for entrance     Functional Status Assessment   Patient has had a recent decline in their functional status and demonstrates the ability to make significant improvements in function in a reasonable and predictable amount of time.     Equipment Recommendations   BSC/3in1;Other (comment) (RW)     Recommendations for Other Services         Precautions/Restrictions   Precautions Precautions: Fall Restrictions Weight Bearing Restrictions Per Provider Order: No     Mobility Bed Mobility Overal bed mobility: Needs  Assistance Bed Mobility: Supine to Sit     Supine to sit: Mod assist     General bed mobility comments: mod A due to pain in buttocks, help with sitting up    Transfers Overall transfer level: Needs assistance Equipment used: None Transfers: Sit to/from Stand, Bed to chair/wheelchair/BSC Sit to Stand: Contact guard assist     Step pivot transfers: Contact guard assist     General transfer comment: CGA without AD, would benefit from RW      Balance Overall balance assessment: Mild deficits observed, not formally tested                                         ADL either performed or assessed with clinical judgement   ADL Overall ADL's : Needs assistance/impaired Eating/Feeding: Independent   Grooming: Set up;Sitting   Upper Body Bathing: Set up;Sitting   Lower Body Bathing: Moderate assistance;Sitting/lateral leans;Sit to/from stand   Upper Body Dressing : Set up;Sitting   Lower Body Dressing: Moderate assistance;Sitting/lateral leans;Sit to/from stand   Toilet Transfer: Contact guard assist;BSC/3in1   Toileting- Clothing Manipulation and Hygiene: Set up;Sitting/lateral lean;Sit to/from stand         General ADL Comments: Pt needs increased help with LB ADLs due to pain in buttocks, difficulty leaning side to side or reaching down to BLEs. good UE function.     Vision Baseline Vision/History: 0 No visual deficits Ability to See in Adequate Light: 0 Adequate Patient Visual Report: No change from baseline       Perception  Praxis         Pertinent Vitals/Pain Pain Assessment Pain Assessment: 0-10 Pain Score: 10-Worst pain ever Pain Location: R buttocks all the time Pain Descriptors / Indicators: Aching, Discomfort Pain Intervention(s): Monitored during session     Extremity/Trunk Assessment Upper Extremity Assessment Upper Extremity Assessment: Overall WFL for tasks assessed           Communication  Communication Communication: No apparent difficulties   Cognition Arousal: Alert Behavior During Therapy: WFL for tasks assessed/performed Cognition: No apparent impairments                               Following commands: Intact       Cueing  General Comments   Cueing Techniques: Verbal cues      Exercises     Shoulder Instructions      Home Living Family/patient expects to be discharged to:: Private residence Living Arrangements: Parent;Other relatives (mother and two brothers) Available Help at Discharge: Family;Available 24 hours/day Type of Home: Apartment       Home Layout: One level     Bathroom Shower/Tub: Tub/shower unit         Home Equipment: [place] - single point   Additional Comments: PT lives with mother and two brothers.      Prior Functioning/Environment Prior Level of Function : Independent/Modified Independent             Mobility Comments: used cane for a couple months after knee injury, but recently stopped using it ADLs Comments: ind    OT Problem List: Decreased strength;Decreased range of motion;Impaired balance (sitting and/or standing);Pain;Obesity   OT Treatment/Interventions: Self-care/ADL training;Energy conservation;DME and/or AE instruction;Therapeutic activities;Patient/family education;Balance training      OT Goals(Current goals can be found in the care plan section)   Acute Rehab OT Goals Patient Stated Goal: to manage pain OT Goal Formulation: With patient Time For Goal Achievement: 07/30/24 Potential to Achieve Goals: Good   OT Frequency:  Min 2X/week    Co-evaluation              AM-PAC OT 6 Clicks Daily Activity     Outcome Measure Help from another person eating meals?: None Help from another person taking care of personal grooming?: A Little Help from another person toileting, which includes using toliet, bedpan, or urinal?: A Little Help from another person bathing (including  washing, rinsing, drying)?: A Lot Help from another person to put on and taking off regular upper body clothing?: A Little Help from another person to put on and taking off regular lower body clothing?: A Lot 6 Click Score: 17   End of Session Nurse Communication: Mobility status  Activity Tolerance: Patient limited by pain Patient left: in chair;with call bell/phone within reach  OT Visit Diagnosis: Unsteadiness on feet (R26.81);Other abnormalities of gait and mobility (R26.89);Muscle weakness (generalized) (M62.81);Repeated falls (R29.6);Pain Pain - Right/Left: Right Pain - part of body:  (buttocks)                Time: 8891-8871 OT Time Calculation (min): 20 min Charges:  OT General Charges $OT Visit: 1 Visit OT Evaluation $OT Eval Low Complexity: 1 Low  [address], OTR/L   Elouise JONELLE Bott 07/16/2024, 11:39 AM

## 2024-07-16 NOTE — Progress Notes (Signed)
 PROGRESS NOTE    Travis Rollins  FMW:[id number] DOB: 06-23-1966 DOA: 07/12/2024 PCP: [name], NP   Brief Narrative:  58 year old male with history of obesity, diabetes mellitus type 2 on insulin , hypertension, DVT/PEs in the past on Coumadin  with history of IVC replacement, depression, hyperlipidemia presented as a level 1 trauma after having a syncopal episode.  On presentation, he was hypotensive, and was found to have saddle PE with right heart strain with INR of 1.3, troponin of 0.91, lactic acid of 3.5, creatinine of 1.7, platelets of 67.  He was started on heparin  drip and admitted to ICU under PCCM service.  He underwent mechanical thrombectomy with bilateral clot retrieval, IVC filter removal and placement by IR on 07/12/2024.  Hematology/oncology was also consulted.  He was switched to therapeutic dose of Lovenox  and Coumadin  started.  He has been transferred to TRH service from 07/14/2024 onwards.  Assessment & Plan:   Cardiac syncope due to obstructive shock: Present on admission Saddle pulmonary emboli/massive PE with 2 syncopal episodes Chronic DVT of left femoral vein with an acute on chronic DVT of popliteal vein and acute DVT of right gastrocnemius and right popliteal vein History of antiphospholipid antibody syndrome per patient, but no documented history of this in the chart Troponin elevation due to PE - He was started on heparin  drip and admitted to ICU under PCCM service.  He underwent mechanical thrombectomy with bilateral clot retrieval, IVC filter removal and placement by IR on 07/12/2024.  Hematology/oncology following.  He was switched to therapeutic dose of Lovenox  and Coumadin  started.  He has been transferred to TRH service from 07/14/2024 onwards. - INR still subtherapeutic at 1.5 today.  Pharmacy following.  Continue Lovenox  and Coumadin .  Will need follow-up with Coumadin  clinic.  Needs lifelong anticoagulation. - Ultrasound duplex showed bilateral lower extremity  DVTs as above  AKI due to massive PE and shock - Improved  Thrombocytopenia - Platelets 67 today.  Monitor.  No signs of bleeding.  Hypertension Hyperlipidemia - Continue statin and Zetia .  Blood pressure on the lower side.  Antihypertensives on hold.  Diabetes mellitus type 2 with hyperglycemia -Carb modified diet.  Continue long-acting insulin  along with CBGs with SSI and short acting insulin  with meals  BPH -Continue tamsulosin   Morbid obesity - Outpatient follow-up  Hyperkalemia - Resolved  Acute metabolic acidosis -improved  Acute blood loss anemia due to blood loss from hematoma - Hemoglobin currently stable.  Monitor intermittently  Macrocytosis - Resolved  Right buttock hematoma - Possibly traumatic from his syncopal episode at presentation  Physical deconditioning - PT eval   DVT prophylaxis: Therapeutic Lovenox  and Coumadin  Code Status: Full Family Communication: None at bedside Disposition Plan: Status is: Inpatient Remains inpatient appropriate because: Of severity of illness  Consultants: PCCM/IR/oncology  Procedures: As above  Antimicrobials: None   Subjective: Patient seen and examined at bedside.  Still complains of buttock pain with intermittent shortness of breath and cough.  No chest pain, abdominal pain, vomiting reported. Objective: Vitals:   07/15/24 2201 07/16/24 0528 07/16/24 0710 07/16/24 0724  BP: 119/73 102/67  105/67  Pulse: 99 92  83  Resp: 20 17  18   Temp: 98.5 F (36.9 C) 98.7 F (37.1 C)  98.8 F (37.1 C)  TempSrc: Oral Oral  Oral  SpO2: 98% 97%  99%  Weight:   116.2 kg   Height:        Intake/Output Summary (Last 24 hours) at 07/16/2024 0836 Last data filed at 07/16/2024  0600 Gross per 24 hour  Intake 480 ml  Output 1100 ml  Net -620 ml   Filed Weights   07/14/24 0500 07/15/24 0715 07/16/24 0710  Weight: 102.4 kg 116.5 kg 116.2 kg    Examination:  General: On room air.  No distress ENT/neck: No  thyromegaly.  JVD is not elevated  respiratory: Decreased breath sounds at bases bilaterally with some crackles; no wheezing  CVS: S1-S2 heard, rate controlled currently Abdominal: Soft, morbidly obese, nontender, slightly distended; no organomegaly, bowel sounds are heard Extremities: Trace lower extremity edema; no cyanosis  CNS: Awake and alert.  Still slow to respond.  No focal neurologic deficit.  Moves extremities Lymph: No obvious lymphadenopathy Skin: No obvious ecchymosis/lesions  psych: Mostly flat affect.  Not agitated currently. musculoskeletal: No obvious joint swelling/deformity    Data Reviewed: I have personally reviewed following labs and imaging studies  CBC: Recent Labs  Lab 07/13/24 0105 07/13/24 1557 07/14/24 0604 07/15/24 1235 07/16/24 0447  WBC 4.0 5.3 5.6 5.0 4.0  NEUTROABS  --   --   --   --  2.4  HGB 13.8 11.8* 11.3* 11.1* 10.0*  HCT 43.9 37.5* 35.8* 34.6* 31.2*  MCV 100.5* 99.2 101.4* 98.6 97.8  PLT 54* 55* 53* 65* 67*   Basic Metabolic Panel: Recent Labs  Lab 07/12/24 1803 07/12/24 1809 07/13/24 0105 07/14/24 1012 07/15/24 0620 07/16/24 0447  NA 138 141 140 137 142 135  K 4.6 4.0 5.5* 4.4 4.1 3.9  CL 109 113* 112* 109 109 104  CO2 16*  --  19* 20* 23 23  GLUCOSE 241* 238* 208* 174* 127* 133*  BUN 32* 32* 31* 20 13 12   CREATININE 1.76* 1.70* 1.38* 1.00 1.04 1.02  CALCIUM  8.8*  --  8.5* 8.3* 8.3* 8.2*  MG  --   --  2.2  --   --  1.9  PHOS  --   --  4.1  --   --   --    GFR: Estimated Creatinine Clearance: 95.8 mL/min (by C-G formula based on SCr of 1.02 mg/dL). Liver Function Tests: Recent Labs  Lab 07/12/24 1803 07/14/24 1012  AST 360* 75*  ALT 311* 194*  ALKPHOS 85 68  BILITOT 1.0 0.5  PROT 6.5 6.1*  ALBUMIN 3.3* 3.1*   No results for input(s): LIPASE, AMYLASE in the last 168 hours. No results for input(s): AMMONIA in the last 168 hours. Coagulation Profile: Recent Labs  Lab 07/12/24 1803 07/14/24 0604  07/15/24 0620 07/16/24 0447  INR 1.3* 1.2 1.3* 1.5*   Cardiac Enzymes: No results for input(s): CKTOTAL, CKMB, CKMBINDEX, TROPONINI in the last 168 hours. BNP (last 3 results) No results for input(s): PROBNP in the last 8760 hours. HbA1C: No results for input(s): HGBA1C in the last 72 hours.  CBG: Recent Labs  Lab 07/15/24 0759 07/15/24 1121 07/15/24 1613 07/15/24 2205 07/16/24 0656  GLUCAP 201* 171* 165* 134* 136*   Lipid Profile: No results for input(s): CHOL, HDL, LDLCALC, TRIG, CHOLHDL, LDLDIRECT in the last 72 hours. Thyroid  Function Tests: No results for input(s): TSH, T4TOTAL, FREET4, T3FREE, THYROIDAB in the last 72 hours. Anemia Panel: No results for input(s): VITAMINB12, FOLATE, FERRITIN, TIBC, IRON, RETICCTPCT in the last 72 hours. Sepsis Labs: Recent Labs  Lab 07/12/24 1809 07/12/24 1858  LATICACIDVEN 3.5* 3.6*    Recent Results (from the past 240 hours)  MRSA Next Gen by PCR, Nasal     Status: None   Collection Time: 07/12/24 11:55 PM  Specimen: Nasal Mucosa; Nasal Swab  Result Value Ref Range Status   MRSA by PCR Next Gen NOT DETECTED NOT DETECTED Final    Comment: (NOTE) The GeneXpert MRSA Assay (FDA approved for NASAL specimens only), is one component of a comprehensive MRSA colonization surveillance program. It is not intended to diagnose MRSA infection nor to guide or monitor treatment for MRSA infections. Test performance is not FDA approved in patients less than 30 years old. Performed at [facility], [address] [place], [place] [address]          Radiology Studies: VAS US  LOWER EXTREMITY VENOUS (DVT) Result Date: 07/14/2024  Lower Venous DVT Study Patient Name:  ILAI HILLER  Date of Exam:   07/14/2024 Medical Rec #: [id number]           Accession #:    [id number] Date of Birth: Aug 27, 1966            Patient Gender: M Patient Age:   89 years Exam Location:  [facility]  Procedure:      VAS US  LOWER EXTREMITY VENOUS (DVT) Referring Phys: JOHN PAYNE --------------------------------------------------------------------------------  Indications: Pulmonary embolism.  Risk Factors: Hx of DVT and PE. Performing Technologist: Elmarie Lindau, RVT  Examination Guidelines: A complete evaluation includes B-mode imaging, spectral Doppler, color Doppler, and power Doppler as needed of all accessible portions of each vessel. Bilateral testing is considered an integral part of a complete examination. Limited examinations for reoccurring indications may be performed as noted. The reflux portion of the exam is performed with the patient in reverse Trendelenburg.  +---------+---------------+---------+-----------+----------+-------------------+ RIGHT    CompressibilityPhasicitySpontaneityPropertiesThrombus Aging      +---------+---------------+---------+-----------+----------+-------------------+ CFV                                                   bandage and pain    +---------+---------------+---------+-----------+----------+-------------------+ SFJ                                                   Not well visualized +---------+---------------+---------+-----------+----------+-------------------+ FV Prox  Full                                                             +---------+---------------+---------+-----------+----------+-------------------+ FV Mid   Full                                                             +---------+---------------+---------+-----------+----------+-------------------+ FV DistalFull                                                             +---------+---------------+---------+-----------+----------+-------------------+ PFV  Full                                                             +---------+---------------+---------+-----------+----------+-------------------+ POP      Partial                                       Age Indeterminate   +---------+---------------+---------+-----------+----------+-------------------+ PTV      Full                                                             +---------+---------------+---------+-----------+----------+-------------------+ PERO     Full                                                             +---------+---------------+---------+-----------+----------+-------------------+ Gastroc  None                                         Acute               +---------+---------------+---------+-----------+----------+-------------------+   +---------+---------------+---------+-----------+----------+-------------------+ LEFT     CompressibilityPhasicitySpontaneityPropertiesThrombus Aging      +---------+---------------+---------+-----------+----------+-------------------+ CFV      Full           Yes      Yes                                      +---------+---------------+---------+-----------+----------+-------------------+ SFJ      Full                                                             +---------+---------------+---------+-----------+----------+-------------------+ FV Prox  Full                                                             +---------+---------------+---------+-----------+----------+-------------------+ FV Mid   Partial                                      Chronic             +---------+---------------+---------+-----------+----------+-------------------+ FV DistalFull                                                             +---------+---------------+---------+-----------+----------+-------------------+  PFV      Full                                                             +---------+---------------+---------+-----------+----------+-------------------+ POP      Partial                                      acute on chronic     +---------+---------------+---------+-----------+----------+-------------------+ PTV      Full                                                             +---------+---------------+---------+-----------+----------+-------------------+ PERO                                                  Not well visualized +---------+---------------+---------+-----------+----------+-------------------+     Summary: RIGHT: - Findings consistent with acute deep vein thrombosis involving the gastrocnemius, and right popliteal vein.   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  *See table(s) above for measurements and observations. Electronically signed by Debby Robertson on 07/14/2024 at 5:14:16 PM.    Final         Scheduled Meds:  atorvastatin   80 mg Oral Daily   Chlorhexidine  Gluconate Cloth  6 each Topical Daily   enoxaparin  (LOVENOX ) injection  100 mg Subcutaneous BID   ezetimibe   10 mg Oral Daily   insulin  aspart  0-15 Units Subcutaneous TID WC   insulin  aspart  0-5 Units Subcutaneous QHS   insulin  aspart  3 Units Subcutaneous TID WC   insulin  glargine-yfgn  10 Units Subcutaneous Daily   lidocaine   1 patch Transdermal Q24H   tamsulosin   0.4 mg Oral Daily   warfarin  8 mg Oral ONCE-1600   Warfarin - Pharmacist Dosing Inpatient   Does not apply q1600   Continuous Infusions:        Sophie Mao, MD [facility] 07/16/2024, 8:36 AM

## 2024-07-16 NOTE — Progress Notes (Addendum)
 ANTICOAGULATION CONSULT NOTE  Pharmacy Consult for Heparin  + warfarin Indication: pulmonary embolus  Patient Measurements: Height: 5' 6 (167.6 cm) Weight: 116.2 kg (256 lb 2.8 oz) IBW/kg (Calculated) : 63.8 Heparin  Dosing Weight: 90 kg  Vital Signs: Temp: 98.8 F (37.1 C) (08/09 0724) Temp Source: Oral (08/09 0724) BP: 105/67 (08/09 0724) Pulse Rate: 83 (08/09 0724)  Labs: Recent Labs    07/13/24 0942 07/13/24 1557 07/13/24 1906 07/14/24 0604 07/14/24 1012 07/15/24 0620 07/15/24 1235 07/16/24 0447  HGB  --    < >  --  11.3*  --   --  11.1* 10.0*  HCT  --    < >  --  35.8*  --   --  34.6* 31.2*  PLT  --    < >  --  53*  --   --  65* 67*  LABPROT  --   --   --  15.4*  --  17.4*  --  19.1*  INR  --   --   --  1.2  --  1.3*  --  1.5*  HEPARINUNFRC >1.10*  --  0.78* 0.41  --   --   --   --   CREATININE  --   --   --   --  1.00 1.04  --  1.02   < > = values in this interval not displayed.    Estimated Creatinine Clearance: 95.8 mL/min (by C-G formula based on SCr of 1.02 mg/dL).   Assessment: 69 yom with a history of DM, PE on warfarin and IVC in place, chronic DVT. Patient is presenting with fell and hypotension. Heparin  per pharmacy consult placed for pulmonary embolus. CTA w/ acute bilateral pulmonary emboli with large clot burden and right heart strain c/w submassive. Patient s/p IVC filter retrieval/replacement and thrombectomy on 07/12/24 PM.   PTA warfarin regimen: 4 mg daily INR goal range: 3.0-3.5 (for recurrent pulmonary embolisms)  07/16/24: Day 4 Warfarin. INR 1.3>1.5 Platelets 65>67  Goal of Therapy:  Anti-Xa Rollins 0.6-1.0 4 hours after enoxaparin  dose INR Goal: 3.0-3.5 (for recurrent pulmonary embolisms) Monitor platelets by anticoagulation protocol: Yes   Plan:  Continue LMWH 100 mg BID, plan for LMWH anti-Xa today Warfarin 8 mg x1 again today Daily INR, H&H, platelets, and s/sx of bleeding  Travis Rollins, PharmD, BCPS, FNKF Clinical  Pharmacist [place] Please utilize Amion for appropriate phone number to reach the unit pharmacist [facility])  Addendum: Travis Rollins drawn at ~3.5 hours post dose. Goal range is 0.6-1 at 4 hours post dose. Will continue current dose and frequency.   Travis Rollins, PharmD, BCPS, FNKF Clinical Pharmacist [place] Please utilize Amion for appropriate phone number to reach the unit pharmacist [facility])

## 2024-07-16 NOTE — Plan of Care (Signed)

## 2024-07-16 NOTE — Evaluation (Signed)
 Physical Therapy Evaluation Patient Details Name: Travis Rollins MRN: [id number] DOB: 08/15/1966 Today's Date: 07/16/2024  History of Present Illness  58 year old male presented as a level 1 trauma after having a syncopal episode, R buttock hematoma, chronic DVT of BLEs, pulmonary embolism. with history of obesity, diabetes mellitus type 2 on insulin , hypertension, DVT/PEs in the past on Coumadin  with history of IVC replacement, depression, hyperlipidemia, hypertension, DMII, BPH, hyperkalemia, macrocytosis,  Clinical Impression  Patient presents with decreased mobility due to pain under R buttock, decreased balance, decreased activity tolerance and generalized weakness.  He was previously independent, currently CGA/S for sit to stand with greatly increased time due to pain and ambulated in hallway 400' with RW and CGA to S.  Patient will benefit from skilled PT in the acute setting to allow return home with family support and HHPT.       If plan is discharge home, recommend the following: A little help with walking and/or transfers;A little help with bathing/dressing/bathroom   Can travel by private vehicle        Equipment Recommendations None recommended by PT  Recommendations for Other Services       Functional Status Assessment Patient has had a recent decline in their functional status and demonstrates the ability to make significant improvements in function in a reasonable and predictable amount of time.     Precautions / Restrictions Precautions Precautions: Fall Restrictions Weight Bearing Restrictions Per Provider Order: No      Mobility  Bed Mobility               General bed mobility comments: up in recliner    Transfers Overall transfer level: Needs assistance Equipment used: Rolling walker (2 wheels) Transfers: Sit to/from Stand Sit to Stand: Supervision, Contact guard assist           General transfer comment: increased time, heavy UE support and  A for balance/safety    Ambulation/Gait Ambulation/Gait assistance: Contact guard assist, Supervision Gait Distance (Feet): 400 Feet Assistive device: Rolling walker (2 wheels) Gait Pattern/deviations: Step-through pattern, Wide base of support, Decreased stride length       General Gait Details: stiff legged gait with wide BOS around walker leg on one side; though mobilizing with very little help, happy to be up on his feet  Stairs            Wheelchair Mobility     Tilt Bed    Modified Rankin (Stroke Patients Only)       Balance Overall balance assessment: Needs assistance   Sitting balance-Leahy Scale: Good       Standing balance-Leahy Scale: Fair Standing balance comment: can stand static without UE support                             Pertinent Vitals/Pain Pain Assessment Pain Assessment: Faces Faces Pain Scale: Hurts whole lot Pain Location: R buttocks esp with sit<>stand Pain Descriptors / Indicators: Discomfort, Grimacing, Guarding Pain Intervention(s): Monitored during session    Home Living Family/patient expects to be discharged to:: Private residence Living Arrangements: Parent;Other relatives (mother and two brothers) Available Help at Discharge: Family;Available 24 hours/day Type of Home: Apartment Home Access: Level entry       Home Layout: One level Home Equipment: Cane - single Librarian, academic (2 wheels) Additional Comments: PT lives with mother and two brothers.    Prior Function Prior Level of Function : Independent/Modified Independent  Mobility Comments: independent       Extremity/Trunk Assessment   Upper Extremity Assessment Upper Extremity Assessment: Defer to OT evaluation    Lower Extremity Assessment Lower Extremity Assessment: RLE deficits/detail RLE Deficits / Details: AROM in chair WFL though painful with leg elevation and not able to fully lift hip       Communication    Communication Communication: No apparent difficulties    Cognition Arousal: Alert Behavior During Therapy: WFL for tasks assessed/performed                             Following commands: Intact       Cueing Cueing Techniques: Verbal cues     General Comments General comments (skin integrity, edema, etc.): HR max 140's, SpO2 97% on RA though dyspnea present; RN Aware HR 113 at rest    Exercises     Assessment/Plan    PT Assessment Patient needs continued PT services  PT Problem List Decreased strength;Decreased balance;Decreased mobility;Decreased activity tolerance;Decreased knowledge of use of DME;Pain       PT Treatment Interventions DME instruction;Gait training;Functional mobility training;Therapeutic activities;Therapeutic exercise;Balance training;Patient/family education    PT Goals (Current goals can be found in the Care Plan section)  Acute Rehab PT Goals Patient Stated Goal: return home to independent PT Goal Formulation: With patient Time For Goal Achievement: 07/30/24 Potential to Achieve Goals: Good    Frequency Min 2X/week     Co-evaluation               AM-PAC PT 6 Clicks Mobility  Outcome Measure Help needed turning from your back to your side while in a flat bed without using bedrails?: A Little Help needed moving from lying on your back to sitting on the side of a flat bed without using bedrails?: A Little Help needed moving to and from a bed to a chair (including a wheelchair)?: A Little Help needed standing up from a chair using your arms (e.g., wheelchair or bedside chair)?: A Little Help needed to walk in hospital room?: A Little Help needed climbing 3-5 steps with a railing? : Total 6 Click Score: 16    End of Session Equipment Utilized During Treatment: Gait belt Activity Tolerance: Patient limited by fatigue Patient left: in chair;with call bell/phone within reach   PT Visit Diagnosis: Other abnormalities of gait  and mobility (R26.89);History of falling (Z91.81)    Time: 1540-1611 PT Time Calculation (min) (ACUTE ONLY): 31 min   Charges:   PT Evaluation $PT Eval Low Complexity: 1 Low PT Treatments $Gait Training: 8-22 mins PT General Charges $$ ACUTE PT VISIT: 1 Visit         Micheline Rollins, PT Acute Rehabilitation Services Office:[phone] 07/16/2024   Travis Rollins 07/16/2024, 5:48 PM

## 2024-07-17 DIAGNOSIS — I2602 Saddle embolus of pulmonary artery with acute cor pulmonale: Secondary | ICD-10-CM | POA: Diagnosis not present

## 2024-07-17 LAB — CBC WITH DIFFERENTIAL/PLATELET
Abs Immature Granulocytes: 0.03 K/uL (ref 0.00–0.07)
Basophils Absolute: 0 K/uL (ref 0.0–0.1)
Basophils Relative: 0 %
Eosinophils Absolute: 0.1 K/uL (ref 0.0–0.5)
Eosinophils Relative: 3 %
HCT: 31.1 % — ABNORMAL LOW (ref 39.0–52.0)
Hemoglobin: 9.9 g/dL — ABNORMAL LOW (ref 13.0–17.0)
Immature Granulocytes: 1 %
Lymphocytes Relative: 21 %
Lymphs Abs: 0.7 K/uL (ref 0.7–4.0)
MCH: 31.6 pg (ref 26.0–34.0)
MCHC: 31.8 g/dL (ref 30.0–36.0)
MCV: 99.4 fL (ref 80.0–100.0)
Monocytes Absolute: 0.8 K/uL (ref 0.1–1.0)
Monocytes Relative: 22 %
Neutro Abs: 1.8 K/uL (ref 1.7–7.7)
Neutrophils Relative %: 53 %
Platelets: 80 K/uL — ABNORMAL LOW (ref 150–400)
RBC: 3.13 MIL/uL — ABNORMAL LOW (ref 4.22–5.81)
RDW: 15.2 % (ref 11.5–15.5)
WBC: 3.5 K/uL — ABNORMAL LOW (ref 4.0–10.5)
nRBC: 0.6 % — ABNORMAL HIGH (ref 0.0–0.2)

## 2024-07-17 LAB — BASIC METABOLIC PANEL WITH GFR
Anion gap: 8 (ref 5–15)
BUN: 14 mg/dL (ref 6–20)
CO2: 20 mmol/L — ABNORMAL LOW (ref 22–32)
Calcium: 8.1 mg/dL — ABNORMAL LOW (ref 8.9–10.3)
Chloride: 106 mmol/L (ref 98–111)
Creatinine, Ser: 1.06 mg/dL (ref 0.61–1.24)
GFR, Estimated: >60 mL/min (ref >60)
Glucose, Bld: 156 mg/dL — ABNORMAL HIGH (ref 70–99)
Potassium: 4.4 mmol/L (ref 3.5–5.1)
Sodium: 134 mmol/L — ABNORMAL LOW (ref 135–145)

## 2024-07-17 LAB — GLUCOSE, CAPILLARY
Glucose-Capillary: 129 mg/dL — ABNORMAL HIGH (ref 70–99)
Glucose-Capillary: 207 mg/dL — ABNORMAL HIGH (ref 70–99)
Glucose-Capillary: 156 mg/dL — ABNORMAL HIGH (ref 70–99)
Glucose-Capillary: 144 mg/dL — ABNORMAL HIGH (ref 70–99)

## 2024-07-17 LAB — MAGNESIUM: Magnesium: 2 mg/dL (ref 1.7–2.4)

## 2024-07-17 LAB — PROTIME-INR
INR: 1.5 — ABNORMAL HIGH (ref 0.8–1.2)
Prothrombin Time: 19.1 s — ABNORMAL HIGH (ref 11.4–15.2)

## 2024-07-17 MED ORDER — WARFARIN SODIUM 10 MG PO TABS
10.0000 mg | ORAL_TABLET | Freq: Once | ORAL | Status: AC
  Administered 2024-07-17: 10 mg via ORAL
  Filled 2024-07-17: qty 1

## 2024-07-17 NOTE — Progress Notes (Signed)
 ANTICOAGULATION CONSULT NOTE  Pharmacy Consult for Heparin  + warfarin Indication: pulmonary embolus  Patient Measurements: Height: 5' 6 (167.6 cm) Weight: 117.9 kg (260 lb) IBW/kg (Calculated) : 63.8 Heparin  Dosing Weight: 90 kg  Vital Signs: Temp: 98.5 F (36.9 C) (08/10 0708) Temp Source: Oral (08/10 0708) BP: 101/67 (08/10 0708) Pulse Rate: 88 (08/10 0708)  Labs: Recent Labs    07/14/24 1012 07/15/24 0620 07/15/24 1235 07/15/24 1235 07/16/24 0447 07/16/24 1055 07/17/24 0742  HGB  --   --  11.1*   < > 10.0*  --  9.9*  HCT  --   --  34.6*  --  31.2*  --  31.1*  PLT  --   --  65*  --  67*  --  80*  LABPROT  --  17.4*  --   --  19.1*  --  19.1*  INR  --  1.3*  --   --  1.5*  --  1.5*  HEPRLOWMOCWT  --   --   --   --   --  1.01  --   CREATININE 1.00 1.04  --   --  1.02  --   --    < > = values in this interval not displayed.    Estimated Creatinine Clearance: 96.5 mL/min (by C-G formula based on SCr of 1.02 mg/dL).   Assessment: 33 yom with a history of DM, PE on warfarin and IVC in place, chronic DVT. Patient is presenting with fell and hypotension. Heparin  per pharmacy consult placed for pulmonary embolus. CTA w/ acute bilateral pulmonary emboli with large clot burden and right heart strain c/w submassive. Patient s/p IVC filter retrieval/replacement and thrombectomy on 07/12/24 PM.   PTA warfarin regimen: 4 mg daily INR goal range: 3.0-3.5 (for recurrent pulmonary embolisms)  07/17/24: Day 5 Warfarin. INR 1.3>1.5>1.5 Platelets 65>67>80  Goal of Therapy:  Anti-Xa level 0.6-1.0 4 hours after enoxaparin  dose INR Goal: 3.0-3.5 (for recurrent pulmonary embolisms) Monitor platelets by anticoagulation protocol: Yes   Plan:  Continue LMWH 100 mg BID Warfarin 10 mg x1 today Daily INR, H&H, platelets, and s/sx of bleeding  Donny Alert, PharmD, [facility] Clinical Pharmacist Please see AMION for all Pharmacists' Contact Phone Numbers 07/17/2024, 9:00 AM

## 2024-07-17 NOTE — Plan of Care (Signed)

## 2024-07-17 NOTE — Progress Notes (Signed)
 PROGRESS NOTE    Travis Rollins  FMW:[id number] DOB: 1966-10-09 DOA: 07/12/2024 PCP: [name], NP   Brief Narrative:  58 year old male with history of obesity, diabetes mellitus type 2 on insulin , hypertension, DVT/PEs in the past on Coumadin  with history of IVC replacement, depression, hyperlipidemia presented as a level 1 trauma after having a syncopal episode.  On presentation, he was hypotensive, and was found to have saddle PE with right heart strain with INR of 1.3, troponin of 0.91, lactic acid of 3.5, creatinine of 1.7, platelets of 67.  He was started on heparin  drip and admitted to ICU under PCCM service.  He underwent mechanical thrombectomy with bilateral clot retrieval, IVC filter removal and placement by IR on 07/12/2024.  Hematology/oncology was also consulted.  He was switched to therapeutic dose of Lovenox  and Coumadin  started.  He has been transferred to TRH service from 07/14/2024 onwards.  Assessment & Plan:   Cardiac syncope due to obstructive shock: Present on admission Saddle pulmonary emboli/massive PE with 2 syncopal episodes Chronic DVT of left femoral vein with an acute on chronic DVT of popliteal vein and acute DVT of right gastrocnemius and right popliteal vein History of antiphospholipid antibody syndrome per patient, but no documented history of this in the chart Troponin elevation due to PE - He was started on heparin  drip and admitted to ICU under PCCM service.  He underwent mechanical thrombectomy with bilateral clot retrieval, IVC filter removal and placement by IR on 07/12/2024.  Hematology/oncology following.  He was switched to therapeutic dose of Lovenox  and Coumadin  started.  He has been transferred to TRH service from 07/14/2024 onwards. - INR pending today.  Pharmacy following.  Continue Lovenox  and Coumadin .  Will need follow-up with Coumadin  clinic.  Needs lifelong anticoagulation. - Ultrasound duplex showed bilateral lower extremity DVTs as above  AKI  due to massive PE and shock - Improved  Thrombocytopenia - Platelets pending today.  Monitor.  No signs of bleeding.  Hypertension Hyperlipidemia - Continue statin and Zetia .  Blood pressure on the lower side.  Antihypertensives on hold.  Diabetes mellitus type 2 with hyperglycemia -Carb modified diet.  Continue long-acting insulin  along with CBGs with SSI and short acting insulin  with meals  BPH -Continue tamsulosin   Morbid obesity - Outpatient follow-up  Hyperkalemia - Resolved  Acute metabolic acidosis -improved  Acute blood loss anemia due to blood loss from hematoma - Hemoglobin currently stable.  Monitor intermittently  Macrocytosis - Resolved  Right buttock hematoma - Possibly traumatic from his syncopal episode at presentation  Physical deconditioning - PT recommending home health PT  DVT prophylaxis: Therapeutic Lovenox  and Coumadin  Code Status: Full Family Communication: None at bedside Disposition Plan: Status is: Inpatient Remains inpatient appropriate because: Of severity of illness  Consultants: PCCM/IR/oncology  Procedures: As above  Antimicrobials: None   Subjective: Patient seen and examined at bedside.  No fever, vomiting, abdominal pain reported.  Complains of intermittent cough. Objective: Vitals:   07/16/24 1736 07/16/24 2001 07/17/24 0433 07/17/24 0708  BP: 116/69 (!) 111/58  101/67  Pulse: (!) 111 (!) 104  88  Resp:  (!) 24    Temp: 98.9 F (37.2 C) 99.9 F (37.7 C)  98.5 F (36.9 C)  TempSrc: Oral Oral  Oral  SpO2: 99% 98%  98%  Weight:   117.9 kg   Height:        Intake/Output Summary (Last 24 hours) at 07/17/2024 0752 Last data filed at 07/17/2024 0600 Gross per 24 hour  Intake 360 ml  Output 350 ml  Net 10 ml   Filed Weights   07/15/24 0715 07/16/24 0710 07/17/24 0433  Weight: 116.5 kg 116.2 kg 117.9 kg    Examination:  General: Remains on room air.  No acute distress.   ENT/neck: No JVD elevation or palpable  neck masses noted respiratory: Bilateral decreased breath sounds at bases with scattered crackles and intermittent tachypnea  CVS: S1-S2 heard, intermittently tachycardic Abdominal: Soft, morbidly obese, nontender, distended mildly; no organomegaly, bowel sounds are heard normally Extremities: No clubbing; mild lower extremity edema present CNS: Remains slow to respond.  Alert.  No focal neurologic deficit.  Able to move extremities Lymph: No obvious palpable lymphadenopathy Skin: No obvious petechia/rashes  psych: Affect is mostly flat.  Not agitated currently.   Musculoskeletal: No obvious joint tenderness/erythema   Data Reviewed: I have personally reviewed following labs and imaging studies  CBC: Recent Labs  Lab 07/13/24 0105 07/13/24 1557 07/14/24 0604 07/15/24 1235 07/16/24 0447  WBC 4.0 5.3 5.6 5.0 4.0  NEUTROABS  --   --   --   --  2.4  HGB 13.8 11.8* 11.3* 11.1* 10.0*  HCT 43.9 37.5* 35.8* 34.6* 31.2*  MCV 100.5* 99.2 101.4* 98.6 97.8  PLT 54* 55* 53* 65* 67*   Basic Metabolic Panel: Recent Labs  Lab 07/12/24 1803 07/12/24 1809 07/13/24 0105 07/14/24 1012 07/15/24 0620 07/16/24 0447  NA 138 141 140 137 142 135  K 4.6 4.0 5.5* 4.4 4.1 3.9  CL 109 113* 112* 109 109 104  CO2 16*  --  19* 20* 23 23  GLUCOSE 241* 238* 208* 174* 127* 133*  BUN 32* 32* 31* 20 13 12   CREATININE 1.76* 1.70* 1.38* 1.00 1.04 1.02  CALCIUM  8.8*  --  8.5* 8.3* 8.3* 8.2*  MG  --   --  2.2  --   --  1.9  PHOS  --   --  4.1  --   --   --    GFR: Estimated Creatinine Clearance: 96.5 mL/min (by C-G formula based on SCr of 1.02 mg/dL). Liver Function Tests: Recent Labs  Lab 07/12/24 1803 07/14/24 1012  AST 360* 75*  ALT 311* 194*  ALKPHOS 85 68  BILITOT 1.0 0.5  PROT 6.5 6.1*  ALBUMIN 3.3* 3.1*   No results for input(s): LIPASE, AMYLASE in the last 168 hours. No results for input(s): AMMONIA in the last 168 hours. Coagulation Profile: Recent Labs  Lab 07/12/24 1803  07/14/24 0604 07/15/24 0620 07/16/24 0447  INR 1.3* 1.2 1.3* 1.5*   Cardiac Enzymes: No results for input(s): CKTOTAL, CKMB, CKMBINDEX, TROPONINI in the last 168 hours. BNP (last 3 results) No results for input(s): PROBNP in the last 8760 hours. HbA1C: No results for input(s): HGBA1C in the last 72 hours.  CBG: Recent Labs  Lab 07/16/24 0656 07/16/24 1153 07/16/24 1714 07/16/24 2237 07/17/24 0707  GLUCAP 136* 149* 174* 159* 129*   Lipid Profile: No results for input(s): CHOL, HDL, LDLCALC, TRIG, CHOLHDL, LDLDIRECT in the last 72 hours. Thyroid  Function Tests: No results for input(s): TSH, T4TOTAL, FREET4, T3FREE, THYROIDAB in the last 72 hours. Anemia Panel: No results for input(s): VITAMINB12, FOLATE, FERRITIN, TIBC, IRON, RETICCTPCT in the last 72 hours. Sepsis Labs: Recent Labs  Lab 07/12/24 1809 07/12/24 1858  LATICACIDVEN 3.5* 3.6*    Recent Results (from the past 240 hours)  MRSA Next Gen by PCR, Nasal     Status: None   Collection Time: 07/12/24 11:55  PM   Specimen: Nasal Mucosa; Nasal Swab  Result Value Ref Range Status   MRSA by PCR Next Gen NOT DETECTED NOT DETECTED Final    Comment: (NOTE) The GeneXpert MRSA Assay (FDA approved for NASAL specimens only), is one component of a comprehensive MRSA colonization surveillance program. It is not intended to diagnose MRSA infection nor to guide or monitor treatment for MRSA infections. Test performance is not FDA approved in patients less than 52 years old. Performed at [facility], [address], [place], [place] [address]          Radiology Studies: No results found.       Scheduled Meds:  atorvastatin   80 mg Oral Daily   Chlorhexidine  Gluconate Cloth  6 each Topical Daily   enoxaparin  (LOVENOX ) injection  100 mg Subcutaneous BID   ezetimibe   10 mg Oral Daily   insulin  aspart  0-15 Units Subcutaneous TID WC   insulin  aspart  0-5 Units  Subcutaneous QHS   insulin  aspart  3 Units Subcutaneous TID WC   insulin  glargine-yfgn  10 Units Subcutaneous Daily   lidocaine   1 patch Transdermal Q24H   tamsulosin   0.4 mg Oral Daily   Warfarin - Pharmacist Dosing Inpatient   Does not apply q1600   Continuous Infusions:        Sophie Mao, MD [facility] 07/17/2024, 7:52 AM

## 2024-07-18 DIAGNOSIS — I2602 Saddle embolus of pulmonary artery with acute cor pulmonale: Secondary | ICD-10-CM | POA: Diagnosis not present

## 2024-07-18 LAB — CBC WITH DIFFERENTIAL/PLATELET
Abs Granulocyte: 2.5 K/uL (ref 1.5–6.5)
Abs Immature Granulocytes: 0.04 K/uL (ref 0.00–0.07)
Basophils Absolute: 0 K/uL (ref 0.0–0.1)
Basophils Relative: 1 %
Eosinophils Absolute: 0 K/uL (ref 0.0–0.5)
Eosinophils Relative: 1 %
HCT: 29.7 % — ABNORMAL LOW (ref 39.0–52.0)
Hemoglobin: 9.8 g/dL — ABNORMAL LOW (ref 13.0–17.0)
Immature Granulocytes: 1 %
Lymphocytes Relative: 21 %
Lymphs Abs: 0.9 K/uL (ref 0.7–4.0)
MCH: 31.7 pg (ref 26.0–34.0)
MCHC: 33 g/dL (ref 30.0–36.0)
MCV: 96.1 fL (ref 80.0–100.0)
Monocytes Absolute: 0.9 K/uL (ref 0.1–1.0)
Monocytes Relative: 20 %
Neutro Abs: 2.5 K/uL (ref 1.7–7.7)
Neutrophils Relative %: 56 %
Platelets: 78 K/uL — ABNORMAL LOW (ref 150–400)
RBC: 3.09 MIL/uL — ABNORMAL LOW (ref 4.22–5.81)
RDW: 15.3 % (ref 11.5–15.5)
WBC: 4.3 K/uL (ref 4.0–10.5)
nRBC: 0 % (ref 0.0–0.2)

## 2024-07-18 LAB — BASIC METABOLIC PANEL WITH GFR
Anion gap: 8 (ref 5–15)
BUN: 18 mg/dL (ref 6–20)
CO2: 22 mmol/L (ref 22–32)
Calcium: 7.9 mg/dL — ABNORMAL LOW (ref 8.9–10.3)
Chloride: 104 mmol/L (ref 98–111)
Creatinine, Ser: 1.18 mg/dL (ref 0.61–1.24)
GFR, Estimated: >60 mL/min (ref >60)
Glucose, Bld: 118 mg/dL — ABNORMAL HIGH (ref 70–99)
Potassium: 4.2 mmol/L (ref 3.5–5.1)
Sodium: 134 mmol/L — ABNORMAL LOW (ref 135–145)

## 2024-07-18 LAB — GLUCOSE, CAPILLARY
Glucose-Capillary: 124 mg/dL — ABNORMAL HIGH (ref 70–99)
Glucose-Capillary: 194 mg/dL — ABNORMAL HIGH (ref 70–99)
Glucose-Capillary: 186 mg/dL — ABNORMAL HIGH (ref 70–99)
Glucose-Capillary: 176 mg/dL — ABNORMAL HIGH (ref 70–99)
Glucose-Capillary: 228 mg/dL — ABNORMAL HIGH (ref 70–99)

## 2024-07-18 LAB — PROTIME-INR
INR: 1.6 — ABNORMAL HIGH (ref 0.8–1.2)
Prothrombin Time: 20.3 s — ABNORMAL HIGH (ref 11.4–15.2)

## 2024-07-18 LAB — MAGNESIUM: Magnesium: 1.8 mg/dL (ref 1.7–2.4)

## 2024-07-18 MED ORDER — WARFARIN SODIUM 10 MG PO TABS
10.0000 mg | ORAL_TABLET | Freq: Once | ORAL | Status: AC
  Administered 2024-07-18 (×2): 10 mg via ORAL
  Filled 2024-07-18: qty 1

## 2024-07-18 NOTE — Progress Notes (Signed)
 PROGRESS NOTE    Travis Rollins  FMW:[id number] DOB: [birth year] DOA: 07/12/2024 PCP: [name], NP   Brief Narrative:  58 year old male with history of obesity, diabetes mellitus type 2 on insulin , hypertension, DVT/PEs in the past on Coumadin  with history of IVC replacement, depression, hyperlipidemia presented as a level 1 trauma after having a syncopal episode.  On presentation, he was hypotensive, and was found to have saddle PE with right heart strain with INR of 1.3, troponin of 0.91, lactic acid of 3.5, creatinine of 1.7, platelets of 67.  He was started on heparin  drip and admitted to ICU under PCCM service.  He underwent mechanical thrombectomy with bilateral clot retrieval, IVC filter removal and placement by IR on 07/12/2024.  Hematology/oncology was also consulted.  He was switched to therapeutic dose of Lovenox  and Coumadin  started.  He has been transferred to TRH service from 07/14/2024 onwards.  Assessment & Plan:   Cardiac syncope due to obstructive shock: Present on admission Saddle pulmonary emboli/massive PE with 2 syncopal episodes Chronic DVT of left femoral vein with an acute on chronic DVT of popliteal vein and acute DVT of right gastrocnemius and right popliteal vein History of antiphospholipid antibody syndrome per patient, but no documented history of this in the chart Troponin elevation due to PE - He was started on heparin  drip and admitted to ICU under PCCM service.  He underwent mechanical thrombectomy with bilateral clot retrieval, IVC filter removal and placement by IR on 07/12/2024.  Hematology/oncology following.  He was switched to therapeutic dose of Lovenox  and Coumadin  started.  He has been transferred to TRH service from 07/14/2024 onwards. - INR 1.6 today.  Pharmacy following.  Continue Lovenox  and Coumadin .  Will need follow-up with Coumadin  clinic.  Needs lifelong anticoagulation. - Ultrasound duplex showed bilateral lower extremity DVTs as above  AKI due  to massive PE and shock - Improved  Thrombocytopenia - Platelets 78 today.  Monitor.  No signs of bleeding.  Hypertension Hyperlipidemia - Continue statin and Zetia .  Blood pressure on the lower side.  Antihypertensives on hold.  Diabetes mellitus type 2 with hyperglycemia -Carb modified diet.  Continue long-acting insulin  along with CBGs with SSI and short acting insulin  with meals  BPH -Continue tamsulosin   Morbid obesity - Outpatient follow-up  Hyperkalemia - Resolved  Hyponatremia - Mild.  Monitor.  Encourage oral intake.  Acute metabolic acidosis -improved  Acute blood loss anemia due to blood loss from hematoma - Hemoglobin currently stable.  Monitor intermittently  Macrocytosis - Resolved  Right buttock hematoma - Possibly traumatic from his syncopal episode at presentation  Physical deconditioning - PT recommending home health PT  DVT prophylaxis: Therapeutic Lovenox  and Coumadin  Code Status: Full Family Communication: None at bedside Disposition Plan: Status is: Inpatient Remains inpatient appropriate because: Of severity of illness  Consultants: PCCM/IR/oncology  Procedures: As above  Antimicrobials: None   Subjective: Patient seen and examined at bedside.  Had fever this morning.  Complains of intermittent shortness of breath and cough.  No abdominal pain, chest pain reported.   Objective: Vitals:   07/17/24 0708 07/17/24 1537 07/17/24 2121 07/18/24 0530  BP: 101/67 120/60 112/71 102/66  Pulse: 88 100 100 95  Resp:   15 18  Temp: 98.5 F (36.9 C) 98 F (36.7 C) 98.4 F (36.9 C) (!) 100.7 F (38.2 C)  TempSrc: Oral Oral Oral Oral  SpO2: 98% 100% 97% 96%  Weight:    118.8 kg  Height:  Intake/Output Summary (Last 24 hours) at 07/18/2024 0730 Last data filed at 07/18/2024 0600 Gross per 24 hour  Intake 240 ml  Output 900 ml  Net -660 ml   Filed Weights   07/16/24 0710 07/17/24 0433 07/18/24 0530  Weight: 116.2 kg 117.9 kg  118.8 kg    Examination:  General: Currently in no distress and on room air.  Chronically ill and deconditioned looking.   ENT/neck: No palpable thyromegaly or elevated JVD noted respiratory: Bilateral decreased breath sounds at bases with some crackles CVS: Rate currently controlled; S1 and S2 are heard Abdominal: Soft, morbidly obese, nontender, distended slightly; no organomegaly, normal bowel sounds are heard  extremities: Trace lower extremity edema; no cyanosis  CNS: Awake; still slow to respond.  No obvious focal neurologic deficits noted lymph: No palpable lymphadenopathy Skin: No obvious ecchymosis/rashes  psych: Show no signs of agitation with mostly flat affect.  Musculoskeletal: No obvious joint tenderness/swelling  Data Reviewed: I have personally reviewed following labs and imaging studies  CBC: Recent Labs  Lab 07/14/24 0604 07/15/24 1235 07/16/24 0447 07/17/24 0742 07/18/24 0410  WBC 5.6 5.0 4.0 3.5* 4.3  NEUTROABS  --   --  2.4 1.8 2.5  HGB 11.3* 11.1* 10.0* 9.9* 9.8*  HCT 35.8* 34.6* 31.2* 31.1* 29.7*  MCV 101.4* 98.6 97.8 99.4 96.1  PLT 53* 65* 67* 80* 78*   Basic Metabolic Panel: Recent Labs  Lab 07/13/24 0105 07/14/24 1012 07/15/24 0620 07/16/24 0447 07/17/24 0742 07/18/24 0410  NA 140 137 142 135 134* 134*  K 5.5* 4.4 4.1 3.9 4.4 4.2  CL 112* 109 109 104 106 104  CO2 19* 20* 23 23 20* 22  GLUCOSE 208* 174* 127* 133* 156* 118*  BUN 31* 20 13 12 14 18   CREATININE 1.38* 1.00 1.04 1.02 1.06 1.18  CALCIUM  8.5* 8.3* 8.3* 8.2* 8.1* 7.9*  MG 2.2  --   --  1.9 2.0 1.8  PHOS 4.1  --   --   --   --   --    GFR: Estimated Creatinine Clearance: 83.8 mL/min (by C-G formula based on SCr of 1.18 mg/dL). Liver Function Tests: Recent Labs  Lab 07/12/24 1803 07/14/24 1012  AST 360* 75*  ALT 311* 194*  ALKPHOS 85 68  BILITOT 1.0 0.5  PROT 6.5 6.1*  ALBUMIN 3.3* 3.1*   No results for input(s): LIPASE, AMYLASE in the last 168 hours. No results  for input(s): AMMONIA in the last 168 hours. Coagulation Profile: Recent Labs  Lab 07/14/24 0604 07/15/24 0620 07/16/24 0447 07/17/24 0742 07/18/24 0410  INR 1.2 1.3* 1.5* 1.5* 1.6*   Cardiac Enzymes: No results for input(s): CKTOTAL, CKMB, CKMBINDEX, TROPONINI in the last 168 hours. BNP (last 3 results) No results for input(s): PROBNP in the last 8760 hours. HbA1C: No results for input(s): HGBA1C in the last 72 hours.  CBG: Recent Labs  Lab 07/16/24 2237 07/17/24 0707 07/17/24 1121 07/17/24 1647 07/17/24 2120  GLUCAP 159* 129* 207* 156* 144*   Lipid Profile: No results for input(s): CHOL, HDL, LDLCALC, TRIG, CHOLHDL, LDLDIRECT in the last 72 hours. Thyroid  Function Tests: No results for input(s): TSH, T4TOTAL, FREET4, T3FREE, THYROIDAB in the last 72 hours. Anemia Panel: No results for input(s): VITAMINB12, FOLATE, FERRITIN, TIBC, IRON, RETICCTPCT in the last 72 hours. Sepsis Labs: Recent Labs  Lab 07/12/24 1809 07/12/24 1858  LATICACIDVEN 3.5* 3.6*    Recent Results (from the past 240 hours)  MRSA Next Gen by PCR, Nasal  Status: None   Collection Time: 07/12/24 11:55 PM   Specimen: Nasal Mucosa; Nasal Swab  Result Value Ref Range Status   MRSA by PCR Next Gen NOT DETECTED NOT DETECTED Final    Comment: (NOTE) The GeneXpert MRSA Assay (FDA approved for NASAL specimens only), is one component of a comprehensive MRSA colonization surveillance program. It is not intended to diagnose MRSA infection nor to guide or monitor treatment for MRSA infections. Test performance is not FDA approved in patients less than 56 years old. Performed at [facility], [address] [place], [place] [address]          Radiology Studies: No results found.       Scheduled Meds:  atorvastatin   80 mg Oral Daily   Chlorhexidine  Gluconate Cloth  6 each Topical Daily   enoxaparin  (LOVENOX ) injection  100 mg  Subcutaneous BID   ezetimibe   10 mg Oral Daily   insulin  aspart  0-15 Units Subcutaneous TID WC   insulin  aspart  0-5 Units Subcutaneous QHS   insulin  aspart  3 Units Subcutaneous TID WC   insulin  glargine-yfgn  10 Units Subcutaneous Daily   lidocaine   1 patch Transdermal Q24H   tamsulosin   0.4 mg Oral Daily   warfarin  10 mg Oral ONCE-1600   Warfarin - Pharmacist Dosing Inpatient   Does not apply q1600   Continuous Infusions:        Sophie Mao, MD [facility] 07/18/2024, 7:30 AM

## 2024-07-18 NOTE — Progress Notes (Signed)
 ANTICOAGULATION CONSULT NOTE  Pharmacy Consult for Heparin  + warfarin Indication: pulmonary embolus  Patient Measurements: Height: 5' 6 (167.6 cm) Weight: 118.8 kg (261 lb 14.5 oz) IBW/kg (Calculated) : 63.8 Heparin  Dosing Weight: 90 kg  Vital Signs: Temp: 100.7 F (38.2 C) (08/11 0530) Temp Source: Oral (08/11 0530) BP: 102/66 (08/11 0530) Pulse Rate: 95 (08/11 0530)  Labs: Recent Labs    07/16/24 0447 07/16/24 1055 07/17/24 0742 07/18/24 0410  HGB 10.0*  --  9.9* 9.8*  HCT 31.2*  --  31.1* 29.7*  PLT 67*  --  80* 78*  LABPROT 19.1*  --  19.1* 20.3*  INR 1.5*  --  1.5* 1.6*  HEPRLOWMOCWT  --  1.01  --   --   CREATININE 1.02  --  1.06 1.18    Estimated Creatinine Clearance: 83.8 mL/min (by C-G formula based on SCr of 1.18 mg/dL).   Assessment: 66 yom with a history of DM, PE on warfarin and IVC in place, chronic DVT. Patient is presenting with fell and hypotension. Heparin  per pharmacy consult placed for pulmonary embolus. CTA w/ acute bilateral pulmonary emboli with large clot burden and right heart strain c/w submassive. Patient s/p IVC filter retrieval/replacement and thrombectomy on 07/12/24 PM.   PTA warfarin regimen: 4 mg daily INR goal range: 3.0-3.5 (for recurrent pulmonary embolisms)  07/18/24: Day 6 Warfarin. INR 1.3>1.5>1.5>1.6 Platelets 65>67>80>78 Hgb stable.   INR climbing slowly. Already at 2.5X home dose. Will repeat higher dose today  Goal of Therapy:  Anti-Xa level 0.6-1.0 4 hours after enoxaparin  dose INR Goal: 3.0-3.5 (for recurrent pulmonary embolisms) Monitor platelets by anticoagulation protocol: Yes   Plan:  Continue LMWH 100 mg BID Warfarin 10 mg x1 today Daily INR, H&H, platelets, and s/sx of bleeding  Juanell Saffo A. Lyle, PharmD, BCPS, FNKF Clinical Pharmacist [place] Please utilize Amion for appropriate phone number to reach the unit pharmacist [facility])  07/18/2024, 7:18 AM

## 2024-07-18 NOTE — Progress Notes (Signed)
 Occupational Therapy Treatment Patient Details Name: Travis Rollins MRN: [id number] DOB: 08-Jan-1966 Today's Date: 07/18/2024   History of present illness 58 year old male presented as a level 1 trauma after having a syncopal episode, R buttock hematoma, chronic DVT of BLEs, pulmonary embolism. with history of obesity, diabetes mellitus type 2 on insulin , hypertension, DVT/PEs in the past on Coumadin  with history of IVC replacement, depression, hyperlipidemia, hypertension, DMII, BPH, hyperkalemia, macrocytosis,   OT comments  Pt sleeping in chair upon arrival. Ambulated with CGA and RW for toileting and completed one grooming activity at sink. Changed gown with set up. Pt reports improved pain and wants to address adaptive equipment use closer to time of discharge. Remained up in chair at end of session. Continue to recommend HHOT.      If plan is discharge home, recommend the following:  Assistance with cooking/housework;Assist for transportation;Help with stairs or ramp for entrance;A little help with walking and/or transfers;A lot of help with bathing/dressing/bathroom   Equipment Recommendations  BSC/3in1 (bariatric)    Recommendations for Other Services      Precautions / Restrictions Precautions Precautions: Fall Recall of Precautions/Restrictions: Intact Restrictions Weight Bearing Restrictions Per Provider Order: No       Mobility Bed Mobility               General bed mobility comments: up in recliner, returned to recliner    Transfers Overall transfer level: Needs assistance Equipment used: Rolling walker (2 wheels) Transfers: Sit to/from Stand Sit to Stand: Contact guard assist           General transfer comment: slow to rise, heavy dependence on UEs to push into standing     Balance Overall balance assessment: Needs assistance   Sitting balance-Leahy Scale: Good       Standing balance-Leahy Scale: Fair Standing balance comment: can stand  static without UE support at sink                           ADL either performed or assessed with clinical judgement   ADL Overall ADL's : Needs assistance/impaired     Grooming: Wash/dry hands;Standing;Contact guard assist           Upper Body Dressing : Set up;Sitting       Toilet Transfer: Contact guard assist;Ambulation;Rolling walker (2 wheels)           Functional mobility during ADLs: Contact guard assist;Rolling walker (2 wheels) General ADL Comments: Educated in availability of AE for LB bathing and dressing. Pt reports pain is better and anticipates as it continues to improve that it will not be necessary. Prefers to revisit use of AE prior to d/c.    Extremity/Trunk Assessment              Vision       Restaurant manager, fast food Communication: No apparent difficulties   Cognition Arousal: Alert Behavior During Therapy: WFL for tasks assessed/performed Cognition: No apparent impairments                               Following commands: Intact        Cueing   Cueing Techniques: Verbal cues  Exercises      Shoulder Instructions       General Comments      Pertinent Vitals/ Pain  Pain Assessment Pain Assessment: Faces Faces Pain Scale: Hurts little more Pain Location: R buttocks esp with sit<>stand Pain Descriptors / Indicators: Discomfort, Grimacing, Guarding Pain Intervention(s): Monitored during session, Repositioned  Home Living                                          Prior Functioning/Environment              Frequency  Min 2X/week        Progress Toward Goals  OT Goals(current goals can now be found in the care plan section)  Progress towards OT goals: Progressing toward goals  Acute Rehab OT Goals OT Goal Formulation: With patient Time For Goal Achievement: 07/30/24 Potential to Achieve Goals: Good  Plan      Co-evaluation                  AM-PAC OT 6 Clicks Daily Activity     Outcome Measure   Help from another person eating meals?: None Help from another person taking care of personal grooming?: A Little Help from another person toileting, which includes using toliet, bedpan, or urinal?: A Little Help from another person bathing (including washing, rinsing, drying)?: A Lot Help from another person to put on and taking off regular upper body clothing?: A Little Help from another person to put on and taking off regular lower body clothing?: A Lot 6 Click Score: 17    End of Session Equipment Utilized During Treatment: Gait belt;Rolling walker (2 wheels)  OT Visit Diagnosis: Unsteadiness on feet (R26.81);Other abnormalities of gait and mobility (R26.89);Muscle weakness (generalized) (M62.81);Repeated falls (R29.6);Pain   Activity Tolerance Patient tolerated treatment well   Patient Left in chair;with call bell/phone within reach   Nurse Communication          Time: 8751-8692 OT Time Calculation (min): 19 min  Charges: OT General Charges $OT Visit: 1 Visit OT Treatments $Self Care/Home Management : 8-22 mins  Mliss HERO, OTR/L Acute Rehabilitation Services Office: [phone]   [name] 07/18/2024, 1:20 PM

## 2024-07-18 NOTE — Care Management Obs Status (Signed)
 MEDICARE OBSERVATION STATUS NOTIFICATION   Patient Details  Name: Travis Rollins MRN: [id number] Date of Birth: 29-Jan-1966   Medicare Observation Status Notification Given:       Claretta Deed 07/18/2024, 4:26 PM

## 2024-07-18 NOTE — Progress Notes (Signed)
 We are still awaiting his INR to become therapeutic.  It is only 1.6 today.  Pharmacy is managing this.  He does have thrombocytopenia but this is stable at 78,000.  His white cell count is 4.3.  Hemoglobin 9.8.  There is been no bleeding.  Again, we need to await the INR to become therapeutic.   Jeralyn Crease, MD  Isaiah 60:1

## 2024-07-18 NOTE — Plan of Care (Signed)
 Patient is aware of plan of care.

## 2024-07-18 NOTE — Progress Notes (Signed)
 Mobility Specialist Progress Note:   07/18/24 1347  Mobility  Activity Ambulated with assistance  Level of Assistance Standby assist, set-up cues, supervision of patient - no hands on  Assistive Device Front wheel walker  Distance Ambulated (ft) 300 ft  Activity Response Tolerated well  Mobility Referral Yes  Mobility visit 1 Mobility  Mobility Specialist Start Time (ACUTE ONLY) 0900  Mobility Specialist Stop Time (ACUTE ONLY) 0910  Mobility Specialist Time Calculation (min) (ACUTE ONLY) 10 min   Received pt in chair having no complaints and agreeable to mobility. Pt was asymptomatic throughout ambulation and returned to room w/o fault. Left in chair w/ call bell in reach and all needs met.   Thersia Minder Mobility Specialist  Please contact vis Secure Chat or  Rehab Office [phone] \

## 2024-07-19 ENCOUNTER — Inpatient Hospital Stay (HOSPITAL_COMMUNITY)

## 2024-07-19 DIAGNOSIS — I2602 Saddle embolus of pulmonary artery with acute cor pulmonale: Secondary | ICD-10-CM | POA: Diagnosis not present

## 2024-07-19 DIAGNOSIS — I724 Aneurysm of artery of lower extremity: Secondary | ICD-10-CM | POA: Diagnosis not present

## 2024-07-19 LAB — BASIC METABOLIC PANEL WITH GFR
Anion gap: 7 (ref 5–15)
BUN: 18 mg/dL (ref 6–20)
CO2: 22 mmol/L (ref 22–32)
Calcium: 7.8 mg/dL — ABNORMAL LOW (ref 8.9–10.3)
Chloride: 105 mmol/L (ref 98–111)
Creatinine, Ser: 1.02 mg/dL (ref 0.61–1.24)
GFR, Estimated: >60 mL/min (ref >60)
Glucose, Bld: 136 mg/dL — ABNORMAL HIGH (ref 70–99)
Potassium: 4 mmol/L (ref 3.5–5.1)
Sodium: 134 mmol/L — ABNORMAL LOW (ref 135–145)

## 2024-07-19 LAB — PROTIME-INR
INR: 2 — ABNORMAL HIGH (ref 0.8–1.2)
Prothrombin Time: 23.4 s — ABNORMAL HIGH (ref 11.4–15.2)

## 2024-07-19 LAB — CBC WITH DIFFERENTIAL/PLATELET
Abs Immature Granulocytes: 0.03 K/uL (ref 0.00–0.07)
Basophils Absolute: 0 K/uL (ref 0.0–0.1)
Basophils Relative: 0 %
Eosinophils Absolute: 0 K/uL (ref 0.0–0.5)
Eosinophils Relative: 1 %
HCT: 30 % — ABNORMAL LOW (ref 39.0–52.0)
Hemoglobin: 9.6 g/dL — ABNORMAL LOW (ref 13.0–17.0)
Immature Granulocytes: 1 %
Lymphocytes Relative: 27 %
Lymphs Abs: 1.2 K/uL (ref 0.7–4.0)
MCH: 31.1 pg (ref 26.0–34.0)
MCHC: 32 g/dL (ref 30.0–36.0)
MCV: 97.1 fL (ref 80.0–100.0)
Monocytes Absolute: 0.7 K/uL (ref 0.1–1.0)
Monocytes Relative: 16 %
Neutro Abs: 2.3 K/uL (ref 1.7–7.7)
Neutrophils Relative %: 55 %
Platelets: 82 K/uL — ABNORMAL LOW (ref 150–400)
RBC: 3.09 MIL/uL — ABNORMAL LOW (ref 4.22–5.81)
RDW: 15.2 % (ref 11.5–15.5)
WBC: 4.2 K/uL (ref 4.0–10.5)
nRBC: 0 % (ref 0.0–0.2)

## 2024-07-19 LAB — GLUCOSE, CAPILLARY
Glucose-Capillary: 204 mg/dL — ABNORMAL HIGH (ref 70–99)
Glucose-Capillary: 199 mg/dL — ABNORMAL HIGH (ref 70–99)
Glucose-Capillary: 199 mg/dL — ABNORMAL HIGH (ref 70–99)
Glucose-Capillary: 166 mg/dL — ABNORMAL HIGH (ref 70–99)

## 2024-07-19 LAB — MAGNESIUM: Magnesium: 2 mg/dL (ref 1.7–2.4)

## 2024-07-19 MED ORDER — INSULIN ASPART 100 UNIT/ML IJ SOLN
6.0000 [IU] | Freq: Three times a day (TID) | INTRAMUSCULAR | Status: DC
  Administered 2024-07-19 – 2024-07-21 (×9): 6 [IU] via SUBCUTANEOUS

## 2024-07-19 MED ORDER — WARFARIN SODIUM 2.5 MG PO TABS
12.5000 mg | ORAL_TABLET | Freq: Once | ORAL | Status: AC
  Administered 2024-07-19 (×2): 12.5 mg via ORAL
  Filled 2024-07-19: qty 1

## 2024-07-19 NOTE — Progress Notes (Signed)
 Mobility Specialist Progress Note:   07/19/24 1200  Mobility  Activity Ambulated with assistance  Level of Assistance Standby assist, set-up cues, supervision of patient - no hands on  Assistive Device Front wheel walker  Distance Ambulated (ft) 150 ft  Activity Response Tolerated well  Mobility Referral Yes  Mobility visit 1 Mobility  Mobility Specialist Start Time (ACUTE ONLY) [id number]  Mobility Specialist Stop Time (ACUTE ONLY) 0919  Mobility Specialist Time Calculation (min) (ACUTE ONLY) 14 min   Received pt in bed having no complaints and agreeable to mobility. Pt was asymptomatic throughout ambulation and returned to room w/o fault. Left in bed w/ call bell in reach and all needs met.   Thersia Minder Mobility Specialist  Please contact vis Secure Chat or  Rehab Office [phone]

## 2024-07-19 NOTE — Progress Notes (Signed)
 R/O pseudo exam is completed. Travis Rollins, RVT

## 2024-07-19 NOTE — Progress Notes (Signed)
 Physical Therapy Treatment Patient Details Name: Travis Rollins MRN: [id number] DOB: 09-04-1966 Today's Date: 07/19/2024   History of Present Illness 58 year old male presented as a level 1 trauma after having a syncopal episode, R buttock hematoma, chronic DVT of BLEs, pulmonary embolism. with history of obesity, diabetes mellitus type 2 on insulin , hypertension, DVT/PEs in the past on Coumadin  with history of IVC replacement, depression, hyperlipidemia, hypertension, DMII, BPH, hyperkalemia, macrocytosis,    PT Comments  Continuing work on functional mobility and activity tolerance;  pt agreeable to get up and move for second walk today; Good use of RW (tells us  he has one at home), and shows good self-monitor; REcommend 3in1, given incr effort with sit to stand; will continue to follow   If plan is discharge home, recommend the following: A little help with walking and/or transfers;A little help with bathing/dressing/bathroom   Can travel by private vehicle        Equipment Recommendations  BSC/3in1    Recommendations for Other Services       Precautions / Restrictions Precautions Precautions: Fall Recall of Precautions/Restrictions: Intact Restrictions Weight Bearing Restrictions Per Provider Order: No     Mobility  Bed Mobility               General bed mobility comments: up in recliner, returned to recliner    Transfers Overall transfer level: Needs assistance Equipment used: Rolling walker (2 wheels) Transfers: Sit to/from Stand Sit to Stand: Contact guard assist           General transfer comment: slow to rise, heavy dependence on UEs to push into standing    Ambulation/Gait Ambulation/Gait assistance: Contact guard assist, Supervision Gait Distance (Feet): 300 Feet Assistive device: Rolling walker (2 wheels) Gait Pattern/deviations: Step-through pattern, Wide base of support, Decreased stride length       General Gait Details: stiff legged  gait with wide BOS around walker leg on one side; though mobilizing with very little help, happy to be up on his feet   Stairs             Wheelchair Mobility     Tilt Bed    Modified Rankin (Stroke Patients Only)       Balance     Sitting balance-Leahy Scale: Good       Standing balance-Leahy Scale: Fair Standing balance comment: can stand static without UE support at sink                            Communication Communication Communication: No apparent difficulties  Cognition Arousal: Alert Behavior During Therapy: WFL for tasks assessed/performed   PT - Cognitive impairments: No apparent impairments                         Following commands: Intact      Cueing Cueing Techniques: Verbal cues  Exercises      General Comments General comments (skin integrity, edema, etc.): HR Max 138, O2 sat 96%on room air end of session though dyspnea present      Pertinent Vitals/Pain Pain Assessment Pain Assessment: Faces Faces Pain Scale: Hurts little more Pain Location: R buttocks esp with sit<>stand Pain Descriptors / Indicators: Discomfort, Grimacing, Guarding Pain Intervention(s): Monitored during session    Home Living                          Prior Function  PT Goals (current goals can now be found in the care plan section) Acute Rehab PT Goals Patient Stated Goal: return home to independent PT Goal Formulation: With patient Time For Goal Achievement: 07/30/24 Potential to Achieve Goals: Good Progress towards PT goals: Progressing toward goals    Frequency    Min 2X/week      PT Plan      Co-evaluation              AM-PAC PT 6 Clicks Mobility   Outcome Measure  Help needed turning from your back to your side while in a flat bed without using bedrails?: A Little Help needed moving from lying on your back to sitting on the side of a flat bed without using bedrails?: A Little Help  needed moving to and from a bed to a chair (including a wheelchair)?: A Little Help needed standing up from a chair using your arms (e.g., wheelchair or bedside chair)?: A Little Help needed to walk in hospital room?: A Little Help needed climbing 3-5 steps with a railing? : Total 6 Click Score: 16    End of Session Equipment Utilized During Treatment: Gait belt Activity Tolerance: Patient tolerated treatment well Patient left: in chair;with call bell/phone within reach Nurse Communication: Mobility status PT Visit Diagnosis: Other abnormalities of gait and mobility (R26.89);History of falling (Z91.81)     Time: 8962-8897 PT Time Calculation (min) (ACUTE ONLY): 25 min  Charges:    $Gait Training: 23-37 mins PT General Charges $$ ACUTE PT VISIT: 1 Visit                     Silvano Currier, PT  Acute Rehabilitation Services Office [phone] Secure Chat welcomed    Silvano VEAR Currier 07/19/2024, 3:57 PM

## 2024-07-19 NOTE — Progress Notes (Signed)
 PROGRESS NOTE    Travis Rollins  FMW:[id number] DOB: Apr 20, 1966 DOA: 07/12/2024 PCP: [name], NP   Brief Narrative:  58 year old male with history of obesity, diabetes mellitus type 2 on insulin , hypertension, DVT/PEs in the past on Coumadin  with history of IVC replacement, depression, hyperlipidemia presented as a level 1 trauma after having a syncopal episode.  On presentation, he was hypotensive, and was found to have saddle PE with right heart strain with INR of 1.3, troponin of 0.91, lactic acid of 3.5, creatinine of 1.7, platelets of 67.  He was started on heparin  drip and admitted to ICU under PCCM service.  He underwent mechanical thrombectomy with bilateral clot retrieval, IVC filter removal and placement by IR on 07/12/2024.  Hematology/oncology was also consulted.  He was switched to therapeutic dose of Lovenox  and Coumadin  started.  He has been transferred to TRH service from 07/14/2024 onwards.  Assessment & Plan:   Cardiac syncope due to obstructive shock: Present on admission Saddle pulmonary emboli/massive PE with 2 syncopal episodes Chronic DVT of left femoral vein with an acute on chronic DVT of popliteal vein and acute DVT of right gastrocnemius and right popliteal vein History of antiphospholipid antibody syndrome per patient, but no documented history of this in the chart Troponin elevation due to PE - He was started on heparin  drip and admitted to ICU under PCCM service.  He underwent mechanical thrombectomy with bilateral clot retrieval, IVC filter removal and placement by IR on 07/12/2024.  Hematology/oncology following: Recommending INR to be between 3-3.5.  He was switched to therapeutic dose of Lovenox  and Coumadin  started.  He has been transferred to TRH service from 07/14/2024 onwards. - INR improving to 2 today.  Pharmacy following.  Continue Lovenox  and Coumadin .  Will need follow-up with Coumadin  clinic.  Needs lifelong anticoagulation. - Ultrasound duplex showed  bilateral lower extremity DVTs as above  AKI due to massive PE and shock - Improved  Thrombocytopenia - Platelets 82 today.  Monitor.  No signs of bleeding.  Hypertension Hyperlipidemia - Continue statin and Zetia .  Blood pressure on the lower side.  Antihypertensives on hold.  Diabetes mellitus type 2 with hyperglycemia -Carb modified diet.  Continue long-acting insulin  along with CBGs with SSI and short acting insulin  with meals  BPH -Continue tamsulosin   Morbid obesity - Outpatient follow-up  Hyperkalemia - Resolved  Hyponatremia - Mild.  Monitor.  Encourage oral intake.  Acute metabolic acidosis -improved  Acute blood loss anemia due to blood loss from hematoma - Hemoglobin currently stable.  Monitor intermittently  Macrocytosis - Resolved  Right buttock hematoma - Possibly traumatic from his syncopal episode at presentation  Physical deconditioning - PT recommending home health PT  DVT prophylaxis: Therapeutic Lovenox  and Coumadin  Code Status: Full Family Communication: None at bedside Disposition Plan: Status is: Inpatient Remains inpatient appropriate because: Of severity of illness  Consultants: PCCM/IR/oncology  Procedures: As above  Antimicrobials: None   Subjective: Patient seen and examined at bedside.  Denies worsening shortness with no fever, vomiting objective: Vitals:   07/18/24 1613 07/18/24 2053 07/19/24 0456 07/19/24 0500  BP: 111/68 130/71 104/65   Pulse: (!) 104 98 91   Resp: 20 18 18    Temp: 99.8 F (37.7 C) 98 F (36.7 C) 98.2 F (36.8 C)   TempSrc:      SpO2: 98% 98% 97%   Weight:    120 kg  Height:        Intake/Output Summary (Last 24 hours) at 07/19/2024 [phone]  Last data filed at 07/18/2024 2300 Gross per 24 hour  Intake 220 ml  Output 850 ml  Net -630 ml   Filed Weights   07/17/24 0433 07/18/24 0530 07/19/24 0500  Weight: 117.9 kg 118.8 kg 120 kg    Examination:  General: Remains on room air.  Currently no  acute distress.  Chronically ill and deconditioned looking.   ENT/neck: No elevation or palpable neck masses noted  respiratory: Decreased breath sounds at bases bilaterally with scattered crackles CVS: S 1 S2 heard; rate mostly controlled  abdominal: Soft, morbidly obese, nontender, mildly distended; no organomegaly, bowel sounds are heard  extremities: No clubbing; mild lower extremity edema present  CNS: Remains slow to respond.  Alert.  No focal deficits noted.   lymph: No obvious palpable lymphadenopathy Skin: No obvious petechia/lesions psych: Not agitated currently.  Affect is flat.  Musculoskeletal: No obvious joint swelling/deformity  Data Reviewed: I have personally reviewed following labs and imaging studies  CBC: Recent Labs  Lab 07/15/24 1235 07/16/24 0447 07/17/24 0742 07/18/24 0410 07/19/24 0459  WBC 5.0 4.0 3.5* 4.3 4.2  NEUTROABS  --  2.4 1.8 2.5 2.3  HGB 11.1* 10.0* 9.9* 9.8* 9.6*  HCT 34.6* 31.2* 31.1* 29.7* 30.0*  MCV 98.6 97.8 99.4 96.1 97.1  PLT 65* 67* 80* 78* 82*   Basic Metabolic Panel: Recent Labs  Lab 07/13/24 0105 07/14/24 1012 07/15/24 0620 07/16/24 0447 07/17/24 0742 07/18/24 0410 07/19/24 0459  NA 140   < > 142 135 134* 134* 134*  K 5.5*   < > 4.1 3.9 4.4 4.2 4.0  CL 112*   < > 109 104 106 104 105  CO2 19*   < > 23 23 20* 22 22  GLUCOSE 208*   < > 127* 133* 156* 118* 136*  BUN 31*   < > 13 12 14 18 18   CREATININE 1.38*   < > 1.04 1.02 1.06 1.18 1.02  CALCIUM  8.5*   < > 8.3* 8.2* 8.1* 7.9* 7.8*  MG 2.2  --   --  1.9 2.0 1.8 2.0  PHOS 4.1  --   --   --   --   --   --    < > = values in this interval not displayed.   GFR: Estimated Creatinine Clearance: 97.5 mL/min (by C-G formula based on SCr of 1.02 mg/dL). Liver Function Tests: Recent Labs  Lab 07/12/24 1803 07/14/24 1012  AST 360* 75*  ALT 311* 194*  ALKPHOS 85 68  BILITOT 1.0 0.5  PROT 6.5 6.1*  ALBUMIN 3.3* 3.1*   No results for input(s): LIPASE, AMYLASE in the  last 168 hours. No results for input(s): AMMONIA in the last 168 hours. Coagulation Profile: Recent Labs  Lab 07/15/24 0620 07/16/24 0447 07/17/24 0742 07/18/24 0410 07/19/24 0459  INR 1.3* 1.5* 1.5* 1.6* 2.0*   Cardiac Enzymes: No results for input(s): CKTOTAL, CKMB, CKMBINDEX, TROPONINI in the last 168 hours. BNP (last 3 results) No results for input(s): PROBNP in the last 8760 hours. HbA1C: No results for input(s): HGBA1C in the last 72 hours.  CBG: Recent Labs  Lab 07/18/24 0814 07/18/24 1109 07/18/24 1133 07/18/24 1612 07/18/24 2049  GLUCAP 124* 194* 186* 176* 228*   Lipid Profile: No results for input(s): CHOL, HDL, LDLCALC, TRIG, CHOLHDL, LDLDIRECT in the last 72 hours. Thyroid  Function Tests: No results for input(s): TSH, T4TOTAL, FREET4, T3FREE, THYROIDAB in the last 72 hours. Anemia Panel: No results for input(s): VITAMINB12, FOLATE, FERRITIN, TIBC,  IRON, RETICCTPCT in the last 72 hours. Sepsis Labs: Recent Labs  Lab 07/12/24 1809 07/12/24 1858  LATICACIDVEN 3.5* 3.6*    Recent Results (from the past 240 hours)  MRSA Next Gen by PCR, Nasal     Status: None   Collection Time: 07/12/24 11:55 PM   Specimen: Nasal Mucosa; Nasal Swab  Result Value Ref Range Status   MRSA by PCR Next Gen NOT DETECTED NOT DETECTED Final    Comment: (NOTE) The GeneXpert MRSA Assay (FDA approved for NASAL specimens only), is one component of a comprehensive MRSA colonization surveillance program. It is not intended to diagnose MRSA infection nor to guide or monitor treatment for MRSA infections. Test performance is not FDA approved in patients less than 32 years old. Performed at [facility], [address], [place], [place] [address]          Radiology Studies: No results found.       Scheduled Meds:  atorvastatin   80 mg Oral Daily   Chlorhexidine  Gluconate Cloth  6 each Topical Daily   enoxaparin   (LOVENOX ) injection  100 mg Subcutaneous BID   ezetimibe   10 mg Oral Daily   insulin  aspart  0-15 Units Subcutaneous TID WC   insulin  aspart  0-5 Units Subcutaneous QHS   insulin  aspart  3 Units Subcutaneous TID WC   insulin  glargine-yfgn  10 Units Subcutaneous Daily   lidocaine   1 patch Transdermal Q24H   tamsulosin   0.4 mg Oral Daily   warfarin  12.5 mg Oral ONCE-1600   Warfarin - Pharmacist Dosing Inpatient   Does not apply q1600   Continuous Infusions:        Sophie Mao, MD [facility] 07/19/2024, 7:35 AM

## 2024-07-19 NOTE — TOC Progression Note (Signed)
 Transition of Care [facility]) - Progression Note    Patient Details  Name: Travis Rollins MRN: [id number] Date of Birth: [birth year]  [facility]) CM/SW Contact  Tom-Johnson, Gilmar Bua Daphne, RN Phone Number: 07/19/2024, 1:45 PM  Clinical Narrative:     CM spoke with patient at bedside about home health recommendations. Patient states he has no preference, CM called in referral to [facility] and [place] voiced acceptance, info on AVS.  DME BSC ordered from [employer] and [facility] to deliver to patient at bedside.   Patient not Medically ready for discharge.  CM will continue to follow as patient progresses with care towards discharge.             Expected Discharge Plan: Home/Self Care Barriers to Discharge: Continued Medical Work up               Expected Discharge Plan and Services In-house Referral: NA Discharge Planning Services: CM Consult Post Acute Care Choice: NA Living arrangements for the past 2 months: Apartment                   DME Agency: NA                   Social Drivers of Health (SDOH) Interventions SDOH Screenings   Food Insecurity: No Food Insecurity (07/15/2024)  Housing: Low Risk  (07/15/2024)  Transportation Needs: No Transportation Needs (07/15/2024)  Utilities: Not At Risk (07/15/2024)  Social Connections: Unknown (06/30/2023)   Received from [facility]  Tobacco Use: Low Risk  (07/15/2024)    Readmission Risk Interventions    07/19/2024    1:44 PM  Readmission Risk Prevention Plan  Transportation Screening Complete  PCP or Specialist Appt within 5-7 Days Complete  Home Care Screening Complete  Medication Review (RN CM) Referral to Pharmacy

## 2024-07-19 NOTE — Progress Notes (Signed)
 Patient s/p PE thrombectomy with IVC filter exchange 07/12/24. IR asked to evaluate right groin vascular access site due to concern for hematoma. Patient assessed in his room and there is a painful lump approximately the size of a grape located superior to the vascular access site. Right groin vascular ultrasound ordered. IR will continue to follow.   Tiauna Whisnant, AGACNP-BC 07/19/2024, 2:38 PM

## 2024-07-19 NOTE — Progress Notes (Signed)
 Physical Therapy Note  Seen for PT session with full note to follow;  Notable for successful walk in the hallway with RW;  HR range from 100 to 138, observed highest; 108 at end of session;  DOE 2/4, O2 sats 96% post walk on room air;  Silvano Currier, PT  Acute Rehabilitation Services Office [phone] Secure Chat welcomed

## 2024-07-19 NOTE — Plan of Care (Signed)
   Problem: Coping: Goal: Ability to adjust to condition or change in health will improve Outcome: Progressing   Problem: Fluid Volume: Goal: Ability to maintain a balanced intake and output will improve Outcome: Progressing

## 2024-07-19 NOTE — Progress Notes (Signed)
 ANTICOAGULATION CONSULT NOTE  Pharmacy Consult for Heparin  + warfarin Indication: pulmonary embolus  Patient Measurements: Height: 5' 6 (167.6 cm) Weight: 120 kg (264 lb 8.8 oz) IBW/kg (Calculated) : 63.8 Heparin  Dosing Weight: 90 kg  Vital Signs: Temp: 98.2 F (36.8 C) (08/12 0456) BP: 104/65 (08/12 0456) Pulse Rate: 91 (08/12 0456)  Labs: Recent Labs    07/16/24 1055 07/17/24 0742 07/17/24 0742 07/18/24 0410 07/19/24 0459  HGB  --  9.9*   < > 9.8* 9.6*  HCT  --  31.1*  --  29.7* 30.0*  PLT  --  80*  --  78* 82*  LABPROT  --  19.1*  --  20.3* 23.4*  INR  --  1.5*  --  1.6* 2.0*  HEPRLOWMOCWT 1.01  --   --   --   --   CREATININE  --  1.06  --  1.18 1.02   < > = values in this interval not displayed.    Estimated Creatinine Clearance: 97.5 mL/min (by C-G formula based on SCr of 1.02 mg/dL).   Assessment: 62 yom with a history of DM, PE on warfarin and IVC in place, chronic DVT. Patient is presenting with fell and hypotension. Heparin  per pharmacy consult placed for pulmonary embolus. CTA w/ acute bilateral pulmonary emboli with large clot burden and right heart strain c/w submassive. Patient s/p IVC filter retrieval/replacement and thrombectomy on 07/12/24 PM.   PTA warfarin regimen: 4 mg daily INR goal range: 3.0-3.5 (for recurrent pulmonary embolisms)  07/19/24: Day 7 Warfarin. INR 1.3>1.5>1.5>1.6>2 Platelets 65>67>80>78>82 Hgb stable.   INR climbing slowly. Already at 2.5 X home dose. Will repeat a higher dose today.  Goal of Therapy:  Anti-Xa level 0.6-1.0 4 hours after enoxaparin  dose INR Goal: 3.0-3.5 (for recurrent pulmonary embolisms) Monitor platelets by anticoagulation protocol: Yes   Plan:  Continue LMWH 100 mg BID Warfarin 12.5 mg x1 today Daily INR, H&H, platelets, and s/sx of bleeding  Analina Filla A. Lyle, PharmD, BCPS, FNKF Clinical Pharmacist [place] Please utilize Amion for appropriate phone number to reach the unit pharmacist [facility]  [facility]arm[facility])  07/19/2024, 7:09 AM

## 2024-07-20 DIAGNOSIS — I2602 Saddle embolus of pulmonary artery with acute cor pulmonale: Secondary | ICD-10-CM | POA: Diagnosis not present

## 2024-07-20 DIAGNOSIS — D62 Acute posthemorrhagic anemia: Secondary | ICD-10-CM

## 2024-07-20 DIAGNOSIS — N179 Acute kidney failure, unspecified: Secondary | ICD-10-CM | POA: Diagnosis not present

## 2024-07-20 DIAGNOSIS — R7401 Elevation of levels of liver transaminase levels: Secondary | ICD-10-CM | POA: Diagnosis not present

## 2024-07-20 DIAGNOSIS — S300XXA Contusion of lower back and pelvis, initial encounter: Secondary | ICD-10-CM

## 2024-07-20 DIAGNOSIS — N400 Enlarged prostate without lower urinary tract symptoms: Secondary | ICD-10-CM | POA: Insufficient documentation

## 2024-07-20 DIAGNOSIS — D539 Nutritional anemia, unspecified: Secondary | ICD-10-CM

## 2024-07-20 HISTORY — DX: Contusion of lower back and pelvis, initial encounter: S30.0XXA

## 2024-07-20 HISTORY — DX: Acute posthemorrhagic anemia: D62

## 2024-07-20 LAB — CBC WITH DIFFERENTIAL/PLATELET
Abs Immature Granulocytes: 0.04 K/uL (ref 0.00–0.07)
Basophils Absolute: 0 K/uL (ref 0.0–0.1)
Basophils Relative: 0 %
Eosinophils Absolute: 0 K/uL (ref 0.0–0.5)
Eosinophils Relative: 1 %
HCT: 30.4 % — ABNORMAL LOW (ref 39.0–52.0)
Hemoglobin: 9.7 g/dL — ABNORMAL LOW (ref 13.0–17.0)
Immature Granulocytes: 1 %
Lymphocytes Relative: 24 %
Lymphs Abs: 1.2 K/uL (ref 0.7–4.0)
MCH: 31.3 pg (ref 26.0–34.0)
MCHC: 31.9 g/dL (ref 30.0–36.0)
MCV: 98.1 fL (ref 80.0–100.0)
Monocytes Absolute: 0.8 K/uL (ref 0.1–1.0)
Monocytes Relative: 15 %
Neutro Abs: 3 K/uL (ref 1.7–7.7)
Neutrophils Relative %: 59 %
Platelets: 80 K/uL — ABNORMAL LOW (ref 150–400)
RBC: 3.1 MIL/uL — ABNORMAL LOW (ref 4.22–5.81)
RDW: 15.3 % (ref 11.5–15.5)
WBC: 5.1 K/uL (ref 4.0–10.5)
nRBC: 0 % (ref 0.0–0.2)

## 2024-07-20 LAB — GLUCOSE, CAPILLARY
Glucose-Capillary: 185 mg/dL — ABNORMAL HIGH (ref 70–99)
Glucose-Capillary: 204 mg/dL — ABNORMAL HIGH (ref 70–99)
Glucose-Capillary: 211 mg/dL — ABNORMAL HIGH (ref 70–99)
Glucose-Capillary: 216 mg/dL — ABNORMAL HIGH (ref 70–99)

## 2024-07-20 LAB — BASIC METABOLIC PANEL WITH GFR
Anion gap: 9 (ref 5–15)
BUN: 17 mg/dL (ref 6–20)
CO2: 22 mmol/L (ref 22–32)
Calcium: 7.9 mg/dL — ABNORMAL LOW (ref 8.9–10.3)
Chloride: 102 mmol/L (ref 98–111)
Creatinine, Ser: 1.03 mg/dL (ref 0.61–1.24)
GFR, Estimated: >60 mL/min (ref >60)
Glucose, Bld: 144 mg/dL — ABNORMAL HIGH (ref 70–99)
Potassium: 3.8 mmol/L (ref 3.5–5.1)
Sodium: 133 mmol/L — ABNORMAL LOW (ref 135–145)

## 2024-07-20 LAB — PROTIME-INR
INR: 2.2 — ABNORMAL HIGH (ref 0.8–1.2)
Prothrombin Time: 25.3 s — ABNORMAL HIGH (ref 11.4–15.2)

## 2024-07-20 LAB — MAGNESIUM: Magnesium: 2 mg/dL (ref 1.7–2.4)

## 2024-07-20 MED ORDER — WARFARIN SODIUM 2.5 MG PO TABS
12.5000 mg | ORAL_TABLET | Freq: Once | ORAL | Status: AC
  Administered 2024-07-20 (×2): 12.5 mg via ORAL
  Filled 2024-07-20: qty 1

## 2024-07-20 NOTE — Progress Notes (Signed)
 ANTICOAGULATION CONSULT NOTE  Pharmacy Consult for Lovenox  + warfarin Indication: pulmonary embolus  Patient Measurements: Height: 5' 6 (167.6 cm) Weight: 117.9 kg (259 lb 14.8 oz) IBW/kg (Calculated) : 63.8 Heparin  Dosing Weight: 90 kg  Vital Signs: Temp: 98.3 F (36.8 C) (08/13 0841) BP: 100/60 (08/13 0841) Pulse Rate: 91 (08/13 0841)  Labs: Recent Labs    07/18/24 0410 07/19/24 0459 07/20/24 0459  HGB 9.8* 9.6* 9.7*  HCT 29.7* 30.0* 30.4*  PLT 78* 82* 80*  LABPROT 20.3* 23.4* 25.3*  INR 1.6* 2.0* 2.2*  CREATININE 1.18 1.02 1.03    Estimated Creatinine Clearance: 95.6 mL/min (by C-G formula based on SCr of 1.03 mg/dL).   Assessment: 34 yom with a history of DM, PE on warfarin and IVC in place, chronic DVT. Patient is presenting with fell and hypotension. Heparin  per pharmacy consult placed for pulmonary embolus. CTA w/ acute bilateral pulmonary emboli with large clot burden and right heart strain c/w submassive. Patient s/p IVC filter retrieval/replacement and thrombectomy on 07/12/24 PM.   PTA warfarin regimen: 4 mg daily INR goal range: 3.0-3.5 (for recurrent pulmonary embolisms)  INR climbing slowly. Already at 2.5 X home dose. Will repeat a higher dose today.  Goal of Therapy:  Anti-Xa level 0.6-1.0 4 hours after enoxaparin  dose INR Goal: 3.0-3.5 (for recurrent pulmonary embolisms) Monitor platelets by anticoagulation protocol: Yes   Plan:  Continue LMWH 100 mg BID Repeat Warfarin 12.5 mg x1 today Daily INR, H&H, platelets, and s/sx of bleeding  Thank you Olam Monte, PharmD Please utilize Amion for appropriate phone number to reach the unit pharmacist [facility])  07/20/2024, 9:40 AM

## 2024-07-20 NOTE — Progress Notes (Signed)
 [facility] PROGRESS NOTE    Progress Note  Travis Rollins  FMW:[id number] DOB: [birth year] DOA: 07/12/2024 PCP: [name], NP     Brief Narrative:   Travis Rollins is an 58 y.o. male past medical history of obesity, diabetes mellitus type 2 on insulin , essential hypertension DVT and PE on Coumadin  in the past with a history of IVC replacement presents as a level 1 trauma after syncopal episode.  On admission he was found hypotensive with a saddle PE right heart strain INR 1.3 platelets of 67 he was started on IV heparin  admitted to the ICU and underwent mechanical thrombectomy and bilateral clot retrieval IVC filter removal and placement on 07/12/2024.  Oncology was consulted was switched to therapeutic Lovenox  and started on Coumadin  transferred to [facility] on 07/14/2024  Assessment/Plan:   Obstructive shock syncope secondary to Acute pulmonary embolism (HCC) With a history of chronic DVT with an acute DVT of the right gastrocnemius and right popliteal vein. INR was therapeutic on admission. History of antiphospholipid syndrome per patient. Started on IV heparin  in the ICU underwent mechanical thrombectomy with clot retrieval. IVC filter removal and placement by IR on 07/12/2024. Oncology recommended to keep INR between 3 and 3.5. He is currently on Lovenox  and Coumadin . INR today is 2.2.  Acute kidney injury: Likely due to shock resolved with IV fluids.  Thrombocytopenia: No signs of overt bleeding platelets are improving.  Essential hypertension/hyperlipidemia: Continue to hold antihypertensive medication continue statins and Zetia .  DM (diabetes mellitus), type 2 with renal complications (HCC) Currently on long-acting insulin  plus sliding scale blood glucose fairly controlled.  BPH: Continue Flomax .  Hyponatremia Resolved with IV fluids.  Hypokalemia: Resolved.  Acute metabolic acidosis Improved with IV fluids.  Acute blood loss anemia/microcytic  anemia: Continue to monitor intermittently hemoglobin relatively stable.  Hematoma of right buttock Noted.    DVT prophylaxis: Lovenox  and Coumadin  Family Communication:none Status is: Inpatient Remains inpatient appropriate because: Acute PE    Code Status:     Code Status Orders  (From admission, onward)           Start     Ordered   07/12/24 2011  Full code  Continuous       Question:  By:  Answer:  Default: patient does not have capacity for decision making, no surrogate or prior directive available   07/12/24 2012           Code Status History     Date Active Date Inactive Code Status Order ID Comments User Context   05/20/2021 1436 05/30/2021 2111 Full Code [id number]  Waddell Rake, MD ED   08/10/2015 0812 08/10/2015 1522 Full Code [id number]  Ladona Heinz, MD Inpatient   07/24/2014 1431 07/25/2014 0337 Full Code [id number]  Luverne Marcey DASEN, MD HOV   02/25/2014 0143 02/28/2014 2221 Full Code [id number]  Rice Schuyler POUR, MD ED         IV Access:   Peripheral IV   Procedures and diagnostic studies:   VAS US  GROIN PSEUDOANEURYSM Result Date: 07/19/2024  ARTERIAL PSEUDOANEURYSM  Patient Name:  Travis Rollins  Date of Exam:   07/19/2024 Medical Rec #: [id number]           Accession #:    [id number] Date of Birth: 1966/08/29            Patient Gender: M Patient Age:   33 years Exam Location:  [facility] Procedure:      VAS US  GROIN PSEUDOANEURYSM  Referring Phys: JAMIE COVINGTON --------------------------------------------------------------------------------  Exam: Right groin Indications: Patient complains of palpable area around access site. Limitations: bandage that pt states was placed a couple of hours ago due to              bleeding so didn't remove Performing Technologist: Elmarie Lindau, RVT  Examination Guidelines: A complete evaluation includes B-mode imaging, spectral Doppler, color Doppler, and power Doppler as needed of all accessible portions of  each vessel. Bilateral testing is considered an integral part of a complete examination. Limited examinations for reoccurring indications may be performed as noted. +------------+----------+---------+------+----------+ Right DuplexPSV (cm/s)Waveform PlaqueComment(s) +------------+----------+---------+------+----------+ CFA             95    triphasic                 +------------+----------+---------+------+----------+ PFA             75    triphasic                 +------------+----------+---------+------+----------+ Prox SFA        86    triphasic                 +------------+----------+---------+------+----------+ Right Vein comments:patent veins  Summary: No evidence of pseudoaneurysm, AVF or DVT  Diagnosing physician: Penne Colorado MD Electronically signed by Penne Colorado MD on 07/19/2024 at 10:04:15 PM.    --------------------------------------------------------------------------------    Final      Medical Consultants:   None.   Subjective:    Travis Rollins relates he feels slightly better.  Objective:    Vitals:   07/19/24 1631 07/19/24 2122 07/20/24 0351 07/20/24 0505  BP: (!) 99/53 (!) 102/56  (!) 95/48  Pulse: 98 92  86  Resp: 18 18  18   Temp: 98.2 F (36.8 C) 98.1 F (36.7 C)  98.3 F (36.8 C)  TempSrc:      SpO2: 100% 95%  97%  Weight:   117.9 kg   Height:       SpO2: 97 % O2 Flow Rate (L/min): 1 L/min   Intake/Output Summary (Last 24 hours) at 07/20/2024 0830 Last data filed at 07/20/2024 0500 Gross per 24 hour  Intake 1120 ml  Output 1750 ml  Net -630 ml   Filed Weights   07/18/24 0530 07/19/24 0500 07/20/24 0351  Weight: 118.8 kg 120 kg 117.9 kg    Exam: General exam: In no acute distress. Respiratory system: Good air movement and clear to auscultation. Cardiovascular system: S1 & S2 heard, RRR. No JVD. Gastrointestinal system: Abdomen is nondistended, soft and nontender.  Extremities: No pedal edema. Skin: No rashes,  lesions or ulcers Psychiatry: Judgement and insight appear normal. Mood & affect appropriate.    Data Reviewed:    Labs: Basic Metabolic Panel: Recent Labs  Lab 07/16/24 0447 07/17/24 0742 07/18/24 0410 07/19/24 0459 07/20/24 0459  NA 135 134* 134* 134* 133*  K 3.9 4.4 4.2 4.0 3.8  CL 104 106 104 105 102  CO2 23 20* 22 22 22   GLUCOSE 133* 156* 118* 136* 144*  BUN 12 14 18 18 17   CREATININE 1.02 1.06 1.18 1.02 1.03  CALCIUM  8.2* 8.1* 7.9* 7.8* 7.9*  MG 1.9 2.0 1.8 2.0 2.0   GFR Estimated Creatinine Clearance: 95.6 mL/min (by C-G formula based on SCr of 1.03 mg/dL). Liver Function Tests: Recent Labs  Lab 07/14/24 1012  AST 75*  ALT 194*  ALKPHOS 68  BILITOT 0.5  PROT 6.1*  ALBUMIN  3.1*   No results for input(s): LIPASE, AMYLASE in the last 168 hours. No results for input(s): AMMONIA in the last 168 hours. Coagulation profile Recent Labs  Lab 07/16/24 0447 07/17/24 0742 07/18/24 0410 07/19/24 0459 07/20/24 0459  INR 1.5* 1.5* 1.6* 2.0* 2.2*   COVID-19 Labs  No results for input(s): DDIMER, FERRITIN, LDH, CRP in the last 72 hours.  Lab Results  Component Value Date   SARSCOV2NAA NEGATIVE 05/20/2021    CBC: Recent Labs  Lab 07/16/24 0447 07/17/24 0742 07/18/24 0410 07/19/24 0459 07/20/24 0459  WBC 4.0 3.5* 4.3 4.2 5.1  NEUTROABS 2.4 1.8 2.5 2.3 3.0  HGB 10.0* 9.9* 9.8* 9.6* 9.7*  HCT 31.2* 31.1* 29.7* 30.0* 30.4*  MCV 97.8 99.4 96.1 97.1 98.1  PLT 67* 80* 78* 82* 80*   Cardiac Enzymes: No results for input(s): CKTOTAL, CKMB, CKMBINDEX, TROPONINI in the last 168 hours. BNP (last 3 results) No results for input(s): PROBNP in the last 8760 hours. CBG: Recent Labs  Lab 07/18/24 2049 07/19/24 0841 07/19/24 1136 07/19/24 1713 07/19/24 2317  GLUCAP 228* 204* 199* 199* 166*   D-Dimer: No results for input(s): DDIMER in the last 72 hours. Hgb A1c: No results for input(s): HGBA1C in the last 72 hours. Lipid  Profile: No results for input(s): CHOL, HDL, LDLCALC, TRIG, CHOLHDL, LDLDIRECT in the last 72 hours. Thyroid  function studies: No results for input(s): TSH, T4TOTAL, T3FREE, THYROIDAB in the last 72 hours.  Invalid input(s): FREET3 Anemia work up: No results for input(s): VITAMINB12, FOLATE, FERRITIN, TIBC, IRON, RETICCTPCT in the last 72 hours. Sepsis Labs: Recent Labs  Lab 07/17/24 0742 07/18/24 0410 07/19/24 0459 07/20/24 0459  WBC 3.5* 4.3 4.2 5.1   Microbiology Recent Results (from the past 240 hours)  MRSA Next Gen by PCR, Nasal     Status: None   Collection Time: 07/12/24 11:55 PM   Specimen: Nasal Mucosa; Nasal Swab  Result Value Ref Range Status   MRSA by PCR Next Gen NOT DETECTED NOT DETECTED Final    Comment: (NOTE) The GeneXpert MRSA Assay (FDA approved for NASAL specimens only), is one component of a comprehensive MRSA colonization surveillance program. It is not intended to diagnose MRSA infection nor to guide or monitor treatment for MRSA infections. Test performance is not FDA approved in patients less than 66 years old. Performed at [facility], [address] [place], [place] [address]      Medications:    atorvastatin   80 mg Oral Daily   Chlorhexidine  Gluconate Cloth  6 each Topical Daily   enoxaparin  (LOVENOX ) injection  100 mg Subcutaneous BID   ezetimibe   10 mg Oral Daily   insulin  aspart  0-15 Units Subcutaneous TID WC   insulin  aspart  0-5 Units Subcutaneous QHS   insulin  aspart  6 Units Subcutaneous TID WC   insulin  glargine-yfgn  10 Units Subcutaneous Daily   lidocaine   1 patch Transdermal Q24H   tamsulosin   0.4 mg Oral Daily   Warfarin - Pharmacist Dosing Inpatient   Does not apply q1600   Continuous Infusions:    LOS: 8 days   Erle Odell Castor  [facility]  07/20/2024, 8:30 AM

## 2024-07-20 NOTE — Progress Notes (Signed)
 Mobility Specialist Progress Note:   07/20/24 1144  Mobility  Activity Ambulated with assistance  Level of Assistance Standby assist, set-up cues, supervision of patient - no hands on  Assistive Device Front wheel walker  Distance Ambulated (ft) 150 ft  Activity Response Tolerated well  Mobility Referral Yes  Mobility visit 1 Mobility  Mobility Specialist Start Time (ACUTE ONLY) 0910  Mobility Specialist Stop Time (ACUTE ONLY) [id number]  Mobility Specialist Time Calculation (min) (ACUTE ONLY) 12 min   Received pt in bed having no complaints and agreeable to mobility. Pt had audible SOB but Spo2 WFL. Returned to room w/o fault. Left in bed w/ call bell in reach and all needs met.  Pre Mobility HR 112 During Mobility HR 110-120 SPO2 RA 93% Post Mobility SPO2 RA 95%  Thersia Minder Mobility Specialist  Please contact vis Secure Chat or  Rehab Office [phone]

## 2024-07-20 NOTE — Care Management Important Message (Signed)
 Important Message  Patient Details  Name: Travis Rollins MRN: [id number] Date of Birth: 30-Apr-1966   Important Message Given:  Yes - Medicare IM     Claretta Deed 07/20/2024, 3:44 PM

## 2024-07-20 NOTE — Progress Notes (Signed)
 Occupational Therapy Treatment Patient Details Name: Travis Rollins MRN: [id number] DOB: Oct 14, 1966 Today's Date: 07/20/2024   History of present illness 58 year old male presented as a level 1 trauma after having a syncopal episode, R buttock hematoma, chronic DVT of BLEs, pulmonary embolism. with history of obesity, diabetes mellitus type 2 on insulin , hypertension, DVT/PEs in the past on Coumadin  with history of IVC replacement, depression, hyperlipidemia, hypertension, DMII, BPH, hyperkalemia, macrocytosis,   OT comments  Pt with increased fatigue and shortness of breath with LB bathing and dressing. Educated and provided AE of sock aid, long shoe horn and long handled bath sponge. Pt with standard size 3 in 1 delivered to room for home, reports he is able to fit on the one he uses here over the toilet. Educated pt in multiple uses of 3 in 1.       If plan is discharge home, recommend the following:  Assistance with cooking/housework;Assist for transportation;Help with stairs or ramp for entrance;A little help with walking and/or transfers;A lot of help with bathing/dressing/bathroom   Equipment Recommendations  BSC/3in1 (standard size delivered)    Recommendations for Other Services      Precautions / Restrictions Precautions Precautions: Fall Recall of Precautions/Restrictions: Intact Restrictions Weight Bearing Restrictions Per Provider Order: No       Mobility Bed Mobility               General bed mobility comments: in chair    Transfers Overall transfer level: Needs assistance Equipment used: Rolling walker (2 wheels) Transfers: Sit to/from Stand Sit to Stand: Supervision                 Balance     Sitting balance-Leahy Scale: Good       Standing balance-Leahy Scale: Fair                             ADL either performed or assessed with clinical judgement   ADL Overall ADL's : Needs assistance/impaired                Lower Body Bathing Details (indicate cue type and reason): educated in use of long handled bath sponge to reach feet     Lower Body Dressing: Set up;Sitting/lateral leans Lower Body Dressing Details (indicate cue type and reason): educated in use of reacher, long handled shoe horn and sock aid for energy conservation       Toileting - Clothing Manipulation Details (indicate cue type and reason): pt reports he does fit on standard BSC placed over toilet            Extremity/Trunk Assessment              Vision       Perception     Praxis     Communication Communication Communication: No apparent difficulties   Cognition Arousal: Alert Behavior During Therapy: Flat affect Cognition: No apparent impairments                               Following commands: Intact        Cueing   Cueing Techniques: Verbal cues  Exercises      Shoulder Instructions       General Comments      Pertinent Vitals/ Pain       Pain Assessment Pain Assessment: Faces Faces Pain Scale: No hurt  Home Living  Prior Functioning/Environment              Frequency  Min 2X/week        Progress Toward Goals  OT Goals(current goals can now be found in the care plan section)  Progress towards OT goals: Progressing toward goals  Acute Rehab OT Goals OT Goal Formulation: With patient Time For Goal Achievement: 07/30/24 Potential to Achieve Goals: Good  Plan      Co-evaluation                 AM-PAC OT 6 Clicks Daily Activity     Outcome Measure   Help from another person eating meals?: None Help from another person taking care of personal grooming?: A Little Help from another person toileting, which includes using toliet, bedpan, or urinal?: A Little Help from another person bathing (including washing, rinsing, drying)?: A Little Help from another person to put on and taking off  regular upper body clothing?: A Little Help from another person to put on and taking off regular lower body clothing?: A Little 6 Click Score: 19    End of Session    OT Visit Diagnosis: Unsteadiness on feet (R26.81);Other abnormalities of gait and mobility (R26.89);Muscle weakness (generalized) (M62.81);Repeated falls (R29.6)   Activity Tolerance Patient tolerated treatment well   Patient Left in chair;with call bell/phone within reach   Nurse Communication          Time: 0950-1003 OT Time Calculation (min): 13 min  Charges: OT General Charges $OT Visit: 1 Visit OT Treatments $Self Care/Home Management : 8-22 mins  Mliss HERO, OTR/L Acute Rehabilitation Services Office: [phone]  [name] 07/20/2024, 10:03 AM

## 2024-07-20 NOTE — Progress Notes (Signed)
 Referring Provider(s): Dr. Orlin Fairly / Dr. Carlyon Gaskins  Supervising Physician: Jennefer Rover  Patient Status:  [name] - In-pt  Chief Complaint: PE s/p thrombectomy and IVC filter retrieval and replacement on 07/12/24 and concern for pseudoaneurysm on 8/12 seen for follow up  Brief History:  58 year old male with a history of prior DVT and PE who presented to the hospital following a syncopal event at home with shortness of breath on arrival and concern for possible trauma. Patient had been on coumadin  for prior VTE. Found to have acute bilateral pulmonary emboli with a large clot burden and right heart strain (RV/LV ratio = 2) considered to be high risk based on [employer]. Patient underwent IVC filter retrieval, angiogram with pulmonary aspiration thrombectomy, and IVC filter replacement with Dr. Jennefer on 07/12/24. Pursestring suture removed on 8/7 which patient tolerated well. Has remained inpatient while his INR becomes therapeutic. IR contacted on 8/12 due to concern for hematoma at the right groin vascular site. Patient was assessed at the bedside by Warren Dais, NP and found to have a painful lump about the size of grape superior to the vascular access site. Vascular US  was ordered and shows no evidence of pseudoaneurysm, AVF or DVT.  Subjective:  Patient sitting upright in bedside chair in no acute distress. States that he continues to feel short of breath. Also admits to feeling feverish. Denies any pain at his groin puncture site unless with palpation.   Allergies: Penicillins  Medications: Prior to Admission medications   Medication Sig Start Date End Date Taking? Authorizing Provider  allopurinol (ZYLOPRIM) 100 MG tablet Take 100 mg by mouth daily.   Yes [provider]  atorvastatin  (LIPITOR) 80 MG tablet Take 80 mg by mouth daily. 04/26/24  Yes [provider]  Capsicum, Cayenne, 455 MG CAPS Take 2 tablets by mouth in the morning and at  bedtime.   Yes [provider]  co-enzyme Q-10 30 MG capsule Take 30 mg by mouth daily.   Yes [provider]  ezetimibe  (ZETIA ) 10 MG tablet Take 10 mg by mouth daily.   Yes [provider]  Febuxostat 80 MG TABS Take 1 tablet by mouth daily. 04/26/24  Yes [provider]  furosemide  (LASIX ) 40 MG tablet Take 40 mg by mouth every morning.    Yes [provider]  L-ARGININE PO Take 1 tablet by mouth daily.   Yes [provider]  lisinopril  (PRINIVIL ,ZESTRIL ) 2.5 MG tablet Take 2.5 mg by mouth daily.   Yes [provider]  Magnesium 200 MG CHEW Chew 2 capsules by mouth in the morning and at bedtime.   Yes [provider]  metoprolol  succinate (TOPROL -XL) 50 MG 24 hr tablet Take 50 mg by mouth at bedtime. Take with or immediately following a meal.   Yes [provider]  omega-3 acid ethyl esters (LOVAZA) 1 g capsule Take 1 g by mouth daily.   Yes [provider]  potassium chloride  (K-DUR) 10 MEQ tablet Take 10 mEq by mouth at bedtime.    Yes [provider]  SYNJARDY XR 12.04-999 MG TB24 Take 1 tablet by mouth 2 (two) times daily. 07/13/23  Yes [provider]  tamsulosin  (FLOMAX ) 0.4 MG CAPS capsule Take 0.4 mg by mouth daily after breakfast.   Yes [provider]  topiramate  (TOPAMAX ) 50 MG tablet Take 50 mg by mouth 2 (two) times daily. 04/26/24  Yes [provider]  TRULICITY 3 MG/0.5ML SOPN  Inject 3 mg into the skin once a week. 04/13/23  Yes [provider]  Turmeric (QC TUMERIC COMPLEX) 500 MG CAPS Take 500 mg by mouth daily.   Yes [provider]  Ubiquinol 100 MG CAPS Take 1 capsule by mouth daily.   Yes [provider]  warfarin (COUMADIN ) 4 MG tablet Take 4 mg by mouth daily.   Yes [provider]  TRESIBA FLEXTOUCH 200 UNIT/ML SOPN Inject 26 Units into the skin at bedtime. 08/25/18   [provider]    Vital Signs: BP (!)  95/48 (BP Location: Left Arm)   Pulse 86   Temp 98.3 F (36.8 C)   Resp 18   Ht 5' 6 (1.676 m)   Wt 259 lb 14.8 oz (117.9 kg)   SpO2 97%   BMI 41.95 kg/m    Physical Exam Constitutional:      Appearance: He is obese.  Cardiovascular:     Rate and Rhythm: Regular rhythm. Tachycardia present.     Heart sounds: Normal heart sounds.     Comments: Puncture site w/ overlying bandage. No evidence of bleeding. Grape sized lump palpable above access site and tender to palpation. Skin is non-erythematous and non-indurated Pulmonary:     Effort: Pulmonary effort is normal.     Breath sounds: Normal breath sounds.  Skin:    General: Skin is warm and dry.  Neurological:     Mental Status: He is alert and oriented to person, place, and time.     Labs:  CBC: Recent Labs    07/17/24 0742 07/18/24 0410 07/19/24 0459 07/20/24 0459  WBC 3.5* 4.3 4.2 5.1  HGB 9.9* 9.8* 9.6* 9.7*  HCT 31.1* 29.7* 30.0* 30.4*  PLT 80* 78* 82* 80*    COAGS: Recent Labs    07/17/24 0742 07/18/24 0410 07/19/24 0459 07/20/24 0459  INR 1.5* 1.6* 2.0* 2.2*    BMP: Recent Labs    07/17/24 0742 07/18/24 0410 07/19/24 0459 07/20/24 0459  NA 134* 134* 134* 133*  K 4.4 4.2 4.0 3.8  CL 106 104 105 102  CO2 20* 22 22 22   GLUCOSE 156* 118* 136* 144*  BUN 14 18 18 17   CALCIUM  8.1* 7.9* 7.8* 7.9*  CREATININE 1.06 1.18 1.02 1.03  GFRNONAA >60 >60 >60 >60    LIVER FUNCTION TESTS: Recent Labs    07/12/24 1803 07/14/24 1012  BILITOT 1.0 0.5  AST 360* 75*  ALT 311* 194*  ALKPHOS 85 68  PROT 6.5 6.1*  ALBUMIN 3.3* 3.1*    Assessment and Plan:  Pulmonary Embolism: Travis Rollins is a 58 y.o. male with a history of bilateral PE s/p thrombectomy and IVC filter retrieval and replacement on 07/12/24. IR contacted due to concern for possible pseudoaneurysm at the groin access site.   -Painful lump superior to access site remains palpable -US  without evidence of pseudoaneurysm, AVF, or DVT;  suspect small hematoma  -Patient continues to feel short of breath; reports feeling feverish and tachycardic on exam -Could consider CXR for further evaluation   IR will sign off  Please feel free to reach out to our team for any additional questions or concerns.   Thank you for allowing our service to participate in Travis Rollins 's care.   Electronically Signed: Bryson Palen M Ryana Montecalvo, PA-C 07/20/2024, 8:17 AM    I spent a total of 15 Minutes at the the patient's bedside AND on the patient's hospital floor or unit, greater than 50% of  which was counseling/coordinating care for PE thrombectomy, IVC filter retrieval/replacement, and possible pseudoaneurysm follow up.

## 2024-07-21 DIAGNOSIS — R55 Syncope and collapse: Secondary | ICD-10-CM

## 2024-07-21 LAB — CBC WITH DIFFERENTIAL/PLATELET
Abs Granulocyte: 2.7 K/uL (ref 1.5–6.5)
Abs Immature Granulocytes: 0.04 K/uL (ref 0.00–0.07)
Basophils Absolute: 0 K/uL (ref 0.0–0.1)
Basophils Relative: 0 %
Eosinophils Absolute: 0.1 K/uL (ref 0.0–0.5)
Eosinophils Relative: 2 %
HCT: 27.7 % — ABNORMAL LOW (ref 39.0–52.0)
Hemoglobin: 9 g/dL — ABNORMAL LOW (ref 13.0–17.0)
Immature Granulocytes: 1 %
Lymphocytes Relative: 21 %
Lymphs Abs: 0.9 K/uL (ref 0.7–4.0)
MCH: 31.5 pg (ref 26.0–34.0)
MCHC: 32.5 g/dL (ref 30.0–36.0)
MCV: 96.9 fL (ref 80.0–100.0)
Monocytes Absolute: 0.6 K/uL (ref 0.1–1.0)
Monocytes Relative: 15 %
Neutro Abs: 2.7 K/uL (ref 1.7–7.7)
Neutrophils Relative %: 61 %
Platelets: 82 K/uL — ABNORMAL LOW (ref 150–400)
RBC: 2.86 MIL/uL — ABNORMAL LOW (ref 4.22–5.81)
RDW: 15.3 % (ref 11.5–15.5)
WBC: 4.3 K/uL (ref 4.0–10.5)
nRBC: 0 % (ref 0.0–0.2)

## 2024-07-21 LAB — BASIC METABOLIC PANEL WITH GFR
Anion gap: 7 (ref 5–15)
BUN: 18 mg/dL (ref 6–20)
CO2: 25 mmol/L (ref 22–32)
Calcium: 7.9 mg/dL — ABNORMAL LOW (ref 8.9–10.3)
Chloride: 104 mmol/L (ref 98–111)
Creatinine, Ser: 1.08 mg/dL (ref 0.61–1.24)
GFR, Estimated: >60 mL/min (ref >60)
Glucose, Bld: 126 mg/dL — ABNORMAL HIGH (ref 70–99)
Potassium: 4 mmol/L (ref 3.5–5.1)
Sodium: 136 mmol/L (ref 135–145)

## 2024-07-21 LAB — MAGNESIUM: Magnesium: 2.2 mg/dL (ref 1.7–2.4)

## 2024-07-21 LAB — PROTIME-INR
INR: 3.7 — ABNORMAL HIGH (ref 0.8–1.2)
Prothrombin Time: 37.9 s — ABNORMAL HIGH (ref 11.4–15.2)

## 2024-07-21 LAB — GLUCOSE, CAPILLARY: Glucose-Capillary: 133 mg/dL — ABNORMAL HIGH (ref 70–99)

## 2024-07-21 MED ORDER — WARFARIN SODIUM 4 MG PO TABS: 7.5000 mg | ORAL_TABLET | Freq: Every day | ORAL | 1 refills | Status: AC

## 2024-07-21 NOTE — Progress Notes (Signed)
 Physical Therapy Treatment Patient Details Name: Travis Rollins MRN: [id number] DOB: 1966-01-26 Today's Date: 07/21/2024   History of Present Illness 58 year old male presented as a level 1 trauma after having a syncopal episode, R buttock hematoma, chronic DVT of BLEs, pulmonary embolism. with history of obesity, diabetes mellitus type 2 on insulin , hypertension, DVT/PEs in the past on Coumadin  with history of IVC replacement, depression, hyperlipidemia, hypertension, DMII, BPH, hyperkalemia, macrocytosis,    PT Comments  Pt made nice progress towards his physical therapy goals during his inpatient stay. Pt demonstrates ability to perform bed mobility, transfers, and household ambulation with RW modI. SpO2 90-94% on RA, HR 92-126 bpm. Education reviewed regarding activity recommendations and progression and IS use. Plan for d/c home with HHPT.    If plan is discharge home, recommend the following: A little help with bathing/dressing/bathroom;Assist for transportation   Can travel by private vehicle        Equipment Recommendations  BSC/3in1    Recommendations for Other Services       Precautions / Restrictions Precautions Precautions: Fall Restrictions Weight Bearing Restrictions Per Provider Order: No     Mobility  Bed Mobility Overal bed mobility: Modified Independent             General bed mobility comments: Use of rail, increased time/effort    Transfers Overall transfer level: Modified independent Equipment used: Rolling walker (2 wheels)                    Ambulation/Gait Ambulation/Gait assistance: Modified independent (Device/Increase time) Gait Distance (Feet): 400 Feet Assistive device: Rolling walker (2 wheels) Gait Pattern/deviations: Step-through pattern, Wide base of support, Decreased stride length       General Gait Details: Intermittently has wide BOS around walker leg on one side, lightly utilizing RW, steady pace and no gross  instability noted   Stairs             Wheelchair Mobility     Tilt Bed    Modified Rankin (Stroke Patients Only)       Balance Overall balance assessment: Needs assistance Sitting-balance support: Feet supported Sitting balance-Leahy Scale: Good     Standing balance support: No upper extremity supported, During functional activity Standing balance-Leahy Scale: Fair                              Hotel manager: No apparent difficulties  Cognition Arousal: Alert Behavior During Therapy: WFL for tasks assessed/performed   PT - Cognitive impairments: No apparent impairments                         Following commands: Intact      Cueing Cueing Techniques: Verbal cues  Exercises      General Comments        Pertinent Vitals/Pain Pain Assessment Pain Assessment: No/denies pain    Home Living                          Prior Function            PT Goals (current goals can now be found in the care plan section) Acute Rehab PT Goals Patient Stated Goal: return home to independent Potential to Achieve Goals: Good Progress towards PT goals: Progressing toward goals    Frequency    Min 2X/week      PT Plan  Co-evaluation              AM-PAC PT 6 Clicks Mobility   Outcome Measure  Help needed turning from your back to your side while in a flat bed without using bedrails?: None Help needed moving from lying on your back to sitting on the side of a flat bed without using bedrails?: None Help needed moving to and from a bed to a chair (including a wheelchair)?: None Help needed standing up from a chair using your arms (e.g., wheelchair or bedside chair)?: None Help needed to walk in hospital room?: None Help needed climbing 3-5 steps with a railing? : A Little 6 Click Score: 23    End of Session   Activity Tolerance: Patient tolerated treatment well Patient left: with call  bell/phone within reach;in chair Nurse Communication: Mobility status PT Visit Diagnosis: Other abnormalities of gait and mobility (R26.89);History of falling (Z91.81)     Time: 9179-9157 PT Time Calculation (min) (ACUTE ONLY): 22 min  Charges:    $Therapeutic Activity: 8-22 mins PT General Charges $$ ACUTE PT VISIT: 1 Visit                     Aleck Daring, PT, DPT Acute Rehabilitation Services Office [phone]    Alayne ONEIDA Daring 07/21/2024, 8:48 AM

## 2024-07-21 NOTE — Plan of Care (Signed)
  Problem: Coping: Goal: Ability to adjust to condition or change in health will improve Outcome: Progressing   Problem: Fluid Volume: Goal: Ability to maintain a balanced intake and output will improve Outcome: Progressing   Problem: Health Behavior/Discharge Planning: Goal: Ability to identify and utilize available resources and services will improve Outcome: Progressing Goal: Ability to manage health-related needs will improve Outcome: Completed/Met   Problem: Metabolic: Goal: Ability to maintain appropriate glucose levels will improve Outcome: Progressing   Problem: Nutritional: Goal: Maintenance of adequate nutrition will improve Outcome: Progressing Goal: Progress toward achieving an optimal weight will improve Outcome: Progressing   Problem: Skin Integrity: Goal: Risk for impaired skin integrity will decrease Outcome: Progressing   Problem: Tissue Perfusion: Goal: Adequacy of tissue perfusion will improve Outcome: Progressing   Problem: Education: Goal: Knowledge of General Education information will improve Description: Including pain rating scale, medication(s)/side effects and non-pharmacologic comfort measures Outcome: Progressing   Problem: Health Behavior/Discharge Planning: Goal: Ability to manage health-related needs will improve Outcome: Progressing   Problem: Clinical Measurements: Goal: Ability to maintain clinical measurements within normal limits will improve Outcome: Progressing Goal: Will remain free from infection Outcome: Progressing Goal: Diagnostic test results will improve Outcome: Progressing Goal: Respiratory complications will improve Outcome: Progressing Goal: Cardiovascular complication will be avoided Outcome: Progressing   Problem: Activity: Goal: Risk for activity intolerance will decrease Outcome: Progressing   Problem: Nutrition: Goal: Adequate nutrition will be maintained Outcome: Progressing   Problem: Coping: Goal:  Level of anxiety will decrease Outcome: Progressing   Problem: Elimination: Goal: Will not experience complications related to bowel motility Outcome: Progressing Goal: Will not experience complications related to urinary retention Outcome: Progressing   Problem: Pain Managment: Goal: General experience of comfort will improve and/or be controlled Outcome: Progressing   Problem: Safety: Goal: Ability to remain free from injury will improve Outcome: Progressing   Problem: Skin Integrity: Goal: Risk for impaired skin integrity will decrease Outcome: Progressing

## 2024-07-21 NOTE — Discharge Summary (Signed)
 Physician Discharge Summary  Travis Rollins FMW:[id number] DOB: 1965/12/30 DOA: 07/12/2024  PCP: [name], NP  Admit date: 07/12/2024 Discharge date: 07/21/2024  Admitted From: Home Disposition:  Home  Recommendations for Outpatient Follow-up:  Follow up with PCP in 1 weeks Please obtain INR Check INR on July 25, 2024   Home Health:Yes Equipment/Devices:no  Discharge Condition:Stable CODE STATUS:Full Diet recommendation: Heart Healthy   Brief/Interim Summary:  58 y.o. male past medical history of obesity, diabetes mellitus type 2 on insulin , essential hypertension DVT and PE on Coumadin  in the past with a history of IVC replacement presents as a level 1 trauma after syncopal episode.  On admission he was found hypotensive with a saddle PE right heart strain INR 1.3 platelets of 67 he was started on IV heparin  admitted to the ICU and underwent mechanical thrombectomy and bilateral clot retrieval IVC filter removal and placement on 07/12/2024.  Oncology was consulted was switched to therapeutic Lovenox  and started on Coumadin  transferred to [facility] on 07/14/2024   Discharge Diagnoses:  Principal Problem:   Acute pulmonary embolism (HCC) Active Problems:   DM (diabetes mellitus), type 2 with renal complications (HCC)   Hyponatremia   Acute metabolic acidosis   Hyperkalemia   Acute respiratory failure with hypoxia (HCC)   Thrombocytopenia (HCC)   Hyperlipidemia   Essential hypertension   AKI (acute kidney injury) (HCC)   BPH (benign prostatic hyperplasia)   Acute blood loss anemia   Macrocytic anemia   Hematoma of right buttock  Obstructive shock leading to syncope secondary to acute pulmonary embolism: With a history of DVT. He was found hypotensive in the ED CT angio showed saddle PE with right heart strain INR was 1.3. Was started on IV heparin  please see him was consulted and admitted to the ICU underwent mechanical thrombectomy with bilateral clot retrieval by IR. IVC  filter removal and placement on 07/12/2024. Oncology was consulted recommended to continue Lovenox  and Coumadin . INR will be 3-3.5. He was discharged with an INR of 3.7. He will continue Coumadin  as an outpatient and follow-up with the Coumadin  clinic on July 25, 2024 to have an INR checked.  Acute kidney injury: Likely secondary to shock resolved by fluids.  Thrombocytopenia: Likely due to acute PE now improved.  Diabetes mellitus type 2 renal complications: No changes made to his regimen continue current regimen as an outpatient.  BPH: Continue Flomax  P  Hypovolemic hyponatremia:  Resolved with IV fluids.  Hypokalemia: Now resolved.  Acute metabolic acidosis: Resolved with IV fluids.  Acute blood loss anemia/microcytic anemia: Hemoglobin relatively stable   Discharge Instructions  Discharge Instructions     Diet - low sodium heart healthy   Complete by: As directed    Increase activity slowly   Complete by: As directed       Allergies as of 07/21/2024       Reactions   Penicillins Hives   Has patient had a PCN reaction causing immediate rash, facial/tongue/throat swelling, SOB or lightheadedness with hypotension: No Has patient had a PCN reaction causing severe rash involving mucus membranes or skin necrosis: No Has patient had a PCN reaction that required hospitalization No Has patient had a PCN reaction occurring within the last 10 years: No If all of the above answers are NO, then may proceed with Cephalosporin use.        Medication List     STOP taking these medications    QC Tumeric Complex 500 MG Caps Generic drug: Turmeric  Ubiquinol 100 MG Caps       TAKE these medications    allopurinol 100 MG tablet Commonly known as: ZYLOPRIM Take 100 mg by mouth daily.   atorvastatin  80 MG tablet Commonly known as: LIPITOR Take 80 mg by mouth daily.   Capsicum (Cayenne) 455 MG Caps Take 2 tablets by mouth in the morning and at bedtime.    co-enzyme Q-10 30 MG capsule Take 30 mg by mouth daily.   ezetimibe  10 MG tablet Commonly known as: ZETIA  Take 10 mg by mouth daily.   Febuxostat 80 MG Tabs Take 1 tablet by mouth daily.   furosemide  40 MG tablet Commonly known as: LASIX  Take 40 mg by mouth every morning.   L-ARGININE PO Take 1 tablet by mouth daily.   lisinopril  2.5 MG tablet Commonly known as: ZESTRIL  Take 2.5 mg by mouth daily.   Magnesium 200 MG Chew Chew 2 capsules by mouth in the morning and at bedtime.   metoprolol  succinate 50 MG 24 hr tablet Commonly known as: TOPROL -XL Take 50 mg by mouth at bedtime. Take with or immediately following a meal.   omega-3 acid ethyl esters 1 g capsule Commonly known as: LOVAZA Take 1 g by mouth daily.   potassium chloride  10 MEQ tablet Commonly known as: KLOR-CON  Take 10 mEq by mouth at bedtime.   Synjardy XR 12.04-999 MG Tb24 Generic drug: Empagliflozin-metFORMIN  HCl ER Take 1 tablet by mouth 2 (two) times daily.   tamsulosin  0.4 MG Caps capsule Commonly known as: FLOMAX  Take 0.4 mg by mouth daily after breakfast.   topiramate  50 MG tablet Commonly known as: TOPAMAX  Take 50 mg by mouth 2 (two) times daily.   Tresiba FlexTouch 200 UNIT/ML FlexTouch Pen Generic drug: insulin  degludec Inject 26 Units into the skin at bedtime.   Trulicity 3 MG/0.5ML Soaj Generic drug: Dulaglutide Inject 3 mg into the skin once a week.   warfarin 4 MG tablet Commonly known as: COUMADIN  Take 2 tablets (8 mg total) by mouth daily. Start taking on: July 22, 2024 What changed: how much to take               Durable Medical Equipment  (From admission, onward)           Start     Ordered   07/19/24 1348  For home use only DME Bedside commode  Once       Comments: Patient is confined to one room, has Generalized Weakness and Decreased Activity Tolerance which necessitate recommendation for bedside commode.  Question:  Patient needs a bedside commode to  treat with the following condition  Answer:  Weakness   07/19/24 1348            Follow-up Information     [name], NP Follow up.   Contact information: [address] [place] [address] [phone]         [facility] Follow up.   Specialty: Home Health Services Why: Someone will call you to schedule first home visit. Contact information: [address] [place] [address] [phone]                Allergies  Allergen Reactions   Penicillins Hives    Has patient had a PCN reaction causing immediate rash, facial/tongue/throat swelling, SOB or lightheadedness with hypotension: No Has patient had a PCN reaction causing severe rash involving mucus membranes or skin necrosis: No Has patient had a PCN reaction that required hospitalization  No Has patient had a PCN reaction occurring within the last 10 years: No If all of the above answers are NO, then may proceed with Cephalosporin use.    Consultations: PCCM Oncology   Procedures/Studies: VAS US  GROIN PSEUDOANEURYSM Result Date: 07/19/2024  ARTERIAL PSEUDOANEURYSM  Patient Name:  Travis Rollins  Date of Exam:   07/19/2024 Medical Rec #: [id number]           Accession #:    [id number] Date of Birth: Nov 16, 1966            Patient Gender: M Patient Age:   54 years Exam Location[facility]  [facility] Procedure:      VAS US  GROIN PSEUDOANEURYSM Referring Phys: WARREN DAIS --------------------------------------------------------------------------------  Exam: Right groin Indications: Patient complains of palpable area around access site. Limitations: bandage that pt states was placed a couple of hours ago due to              bleeding so didn't remove Performing Technologist: Elmarie Lindau, RVT  Examination Guidelines: A complete evaluation includes B-mode imaging, spectral Doppler, color Doppler, and power Doppler as needed of all accessible portions of each vessel. Bilateral testing  is considered an integral part of a complete examination. Limited examinations for reoccurring indications may be performed as noted. +------------+----------+---------+------+----------+ Right DuplexPSV (cm/s)Waveform PlaqueComment(s) +------------+----------+---------+------+----------+ CFA             95    triphasic                 +------------+----------+---------+------+----------+ PFA             75    triphasic                 +------------+----------+---------+------+----------+ Prox SFA        86    triphasic                 +------------+----------+---------+------+----------+ Right Vein comments:patent veins  Summary: No evidence of pseudoaneurysm, AVF or DVT  Diagnosing physician: Penne Colorado MD Electronically signed by Penne Colorado MD on 07/19/2024 at 10:04:15 PM.    --------------------------------------------------------------------------------    Final    VAS US  LOWER EXTREMITY VENOUS (DVT) Result Date: 07/14/2024  Lower Venous DVT Study Patient Name:  Travis Rollins  Date of Exam:   07/14/2024 Medical Rec #: [id number]           Accession #:    [id number] Date of Birth: 08-18-1966            Patient Gender: M Patient Age:   38 years Exam Location:  [facility] Procedure:      VAS US  LOWER EXTREMITY VENOUS (DVT) Referring Phys: JOHN PAYNE --------------------------------------------------------------------------------  Indications: Pulmonary embolism.  Risk Factors: Hx of DVT and PE. Performing Technologist: Elmarie Lindau, RVT  Examination Guidelines: A complete evaluation includes B-mode imaging, spectral Doppler, color Doppler, and power Doppler as needed of all accessible portions of each vessel. Bilateral testing is considered an integral part of a complete examination. Limited examinations for reoccurring indications may be performed as noted. The reflux portion of the exam is performed with the patient in reverse Trendelenburg.   +---------+---------------+---------+-----------+----------+-------------------+ RIGHT    CompressibilityPhasicitySpontaneityPropertiesThrombus Aging      +---------+---------------+---------+-----------+----------+-------------------+ CFV  bandage and pain    +---------+---------------+---------+-----------+----------+-------------------+ SFJ                                                   Not well visualized +---------+---------------+---------+-----------+----------+-------------------+ FV Prox  Full                                                             +---------+---------------+---------+-----------+----------+-------------------+ FV Mid   Full                                                             +---------+---------------+---------+-----------+----------+-------------------+ FV DistalFull                                                             +---------+---------------+---------+-----------+----------+-------------------+ PFV      Full                                                             +---------+---------------+---------+-----------+----------+-------------------+ POP      Partial                                      Age Indeterminate   +---------+---------------+---------+-----------+----------+-------------------+ PTV      Full                                                             +---------+---------------+---------+-----------+----------+-------------------+ PERO     Full                                                             +---------+---------------+---------+-----------+----------+-------------------+ Gastroc  None                                         Acute               +---------+---------------+---------+-----------+----------+-------------------+   +---------+---------------+---------+-----------+----------+-------------------+  LEFT     CompressibilityPhasicitySpontaneityPropertiesThrombus Aging      +---------+---------------+---------+-----------+----------+-------------------+ CFV      Full  Yes      Yes                                      +---------+---------------+---------+-----------+----------+-------------------+ SFJ      Full                                                             +---------+---------------+---------+-----------+----------+-------------------+ FV Prox  Full                                                             +---------+---------------+---------+-----------+----------+-------------------+ FV Mid   Partial                                      Chronic             +---------+---------------+---------+-----------+----------+-------------------+ FV DistalFull                                                             +---------+---------------+---------+-----------+----------+-------------------+ PFV      Full                                                             +---------+---------------+---------+-----------+----------+-------------------+ POP      Partial                                      acute on chronic    +---------+---------------+---------+-----------+----------+-------------------+ PTV      Full                                                             +---------+---------------+---------+-----------+----------+-------------------+ PERO                                                  Not well visualized +---------+---------------+---------+-----------+----------+-------------------+     Summary: RIGHT: - Findings consistent with acute deep vein thrombosis involving the gastrocnemius, and right popliteal vein.   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  *See table(s) above for measurements and  observations. Electronically signed by Debby Robertson on 07/14/2024 at  5:14:16 PM.    Final    US  Abdomen Complete Result Date: 07/14/2024 CLINICAL DATA:  Thrombocytopenia. EXAM: ABDOMEN ULTRASOUND COMPLETE COMPARISON:  CT scan 07/12/2024 FINDINGS: Gallbladder: No gallstones or wall thickening visualized. No sonographic Murphy sign noted by sonographer. Common bile duct: Diameter: Could not be visualized. Liver: Coarsening of hepatic echotexture noted. Right hepatic lobe not well visualized. Portal vein is patent on color Doppler imaging with normal direction of blood flow towards the liver. IVC: Not well visualized due to body habitus, immobility, and bowel gas. Pancreas: Not well visualized due to body habitus, immobility, and bowel gas. Spleen: 10.3 cm craniocaudal length. Partially obscured by bowel gas. Right Kidney: Length: 11.4 cm. Echogenicity within normal limits. No mass or hydronephrosis visualized. Left Kidney: Length: 11.0 cm. Echogenicity within normal limits. No mass or hydronephrosis visualized. Abdominal aorta: Not well visualized due to body habitus, immobility, and bowel gas. Other findings: None. IMPRESSION: 1. Limited study due to body habitus, immobility, and bowel gas. 2. Coarsening of hepatic echotexture. Right hepatic lobe not well visualized. 3. No evidence for splenomegaly. Electronically Signed   By: Camellia Candle M.D.   On: 07/14/2024 09:13   ECHOCARDIOGRAM COMPLETE Result Date: 07/13/2024    ECHOCARDIOGRAM REPORT   Patient Name:   Travis Rollins Date of Exam: 07/13/2024 Medical Rec #:  [id number]          Height:       66.0 in Accession #:    [id number]         Weight:       226.2 lb Date of Birth:  1966/01/06           BSA:          2.107 m Patient Age:    58 years           BP:           108/82 mmHg Patient Gender: M                  HR:           87 bpm. Exam Location:  Inpatient Procedure: 2D Echo, Color Doppler and Cardiac Doppler (Both Spectral and Color            Flow Doppler  were utilized during procedure). Indications:    Pulmonary embolus  History:        Patient has prior history of Echocardiogram examinations, most                 recent 05/21/2021. Signs/Symptoms:Chest Pain.  Sonographer:    Benard Stallion Referring Phys: [id number] NORLEEN BIRCH PAYNE  Sonographer Comments: Technically difficult study due to poor echo windows. IMPRESSIONS  1. Left ventricular ejection fraction, by estimation, is 45 to 50%. The left ventricle has mildly decreased function. Left ventricular endocardial border not optimally defined to evaluate regional wall motion. Left ventricular diastolic parameters were grossly normal.  2. Right ventricular systolic function is mildly reduced. The right ventricular size is normal.  3. The mitral valve is grossly normal. Trivial mitral valve regurgitation. No evidence of mitral stenosis.  4. The aortic valve is grossly normal. Aortic valve regurgitation is mild. No aortic stenosis is present. FINDINGS  Left Ventricle: Left ventricular ejection fraction, by estimation, is 45 to 50%. The left ventricle has mildly decreased function. Left ventricular endocardial border not optimally defined to evaluate regional wall motion. The left ventricular internal cavity size was normal in size. There is no left ventricular hypertrophy.  Left ventricular diastolic parameters were normal. Right Ventricle: The right ventricular size is normal. No increase in right ventricular wall thickness. Right ventricular systolic function is mildly reduced. Left Atrium: Left atrial size was normal in size. Right Atrium: Right atrial size was normal in size. Pericardium: There is no evidence of pericardial effusion. Mitral Valve: The mitral valve is grossly normal. Trivial mitral valve regurgitation. No evidence of mitral valve stenosis. Tricuspid Valve: The tricuspid valve is normal in structure. Tricuspid valve regurgitation is trivial. No evidence of tricuspid stenosis. Aortic Valve: The aortic  valve is grossly normal. Aortic valve regurgitation is mild. Aortic regurgitation PHT measures 636 msec. No aortic stenosis is present. Aortic valve mean gradient measures 5.0 mmHg. Aortic valve peak gradient measures 8.6 mmHg. Aortic valve area, by VTI measures 2.38 cm. Pulmonic Valve: The pulmonic valve was normal in structure. Pulmonic valve regurgitation is not visualized. No evidence of pulmonic stenosis. Aorta: The aortic root is normal in size and structure. Venous: The inferior vena cava was not well visualized. IAS/Shunts: No atrial level shunt detected by color flow Doppler.  LEFT VENTRICLE PLAX 2D LVIDd:         4.60 cm   Diastology LVIDs:         3.60 cm   LV e' medial:    8.27 cm/s LV PW:         0.90 cm   LV E/e' medial:  7.1 LV IVS:        0.90 cm   LV e' lateral:   10.00 cm/s LVOT diam:     2.20 cm   LV E/e' lateral: 5.9 LV SV:         60 LV SV Index:   28 LVOT Area:     3.80 cm  RIGHT VENTRICLE RV S prime:     10.60 cm/s TAPSE (M-mode): 1.9 cm LEFT ATRIUM             Index        RIGHT ATRIUM           Index LA diam:        3.30 cm 1.57 cm/m   RA Area:     20.50 cm LA Vol (A2C):   42.0 ml 19.93 ml/m  RA Volume:   64.50 ml  30.61 ml/m LA Vol (A4C):   43.2 ml 20.50 ml/m LA Biplane Vol: 42.5 ml 20.17 ml/m  AORTIC VALVE AV Area (Vmax):    2.34 cm AV Area (Vmean):   2.14 cm AV Area (VTI):     2.38 cm AV Vmax:           146.50 cm/s AV Vmean:          100.800 cm/s AV VTI:            0.250 m AV Peak Grad:      8.6 mmHg AV Mean Grad:      5.0 mmHg LVOT Vmax:         90.30 cm/s LVOT Vmean:        56.700 cm/s LVOT VTI:          0.157 m LVOT/AV VTI ratio: 0.63 AI PHT:            636 msec  AORTA Ao Root diam: 3.20 cm MITRAL VALVE               TRICUSPID VALVE MV Area (PHT): 4.15 cm    TR Peak grad:   15.8 mmHg MV Decel  Time: 183 msec    TR Vmax:        199.00 cm/s MV E velocity: 58.90 cm/s MV A velocity: 79.80 cm/s  SHUNTS MV E/A ratio:  0.74        Systemic VTI:  0.16 m                             Systemic Diam: 2.20 cm Soyla Merck MD Electronically signed by Soyla Merck MD Signature Date/Time: 07/13/2024/11:08:18 AM    Final    IR THROMBECT PRIM MECH INIT (INCLU) MOD SED Result Date: 07/13/2024 INDICATION: [age]-year-old male with history of acute, high risk pulmonary embolism. EXAM: 1. Ultrasound-guided vascular access of the right common femoral vein. 2. Inferior vena cavogram. 3. Ultrasound-guided vascular access of the right internal jugular vein. 4. Inferior vena cava filter retrieval. 5. Pulmonary angiogram. 6. Central manometry. 7. Bilateral pulmonary artery aspiration thrombectomy. 8. Inferior vena cava filter placement. COMPARISON:  CTA chest from earlier the same day MEDICATIONS: 10,000 units heparin , intravenous ANESTHESIA/SEDATION: Moderate (conscious) sedation was employed during this procedure. A total of Versed  3 mg and Fentanyl  0 mcg was administered intravenously. Moderate Sedation Time: 97 minutes. The patient's level of consciousness and vital signs were monitored continuously by radiology nursing throughout the procedure under my direct supervision. FLUOROSCOPY TIME:  Nine hundred sixteen mGy reference air kerma COMPLICATIONS: None immediate. TECHNIQUE: Informed written consent was obtained from the patient after a thorough discussion of the procedural risks, benefits and alternatives. All questions were addressed. Maximal Sterile Barrier Technique was utilized including caps, mask, sterile gowns, sterile gloves, sterile drape, hand hygiene and skin antiseptic. A timeout was performed prior to the initiation of the procedure. Preprocedure ultrasound evaluation demonstrated patency of the right common femoral vein. The procedure was planned. Subdermal Local anesthesia was administered 1% lidocaine . A small skin nick was made. Under direct ultrasound visualization, the right common femoral vein was accessed with a 21 gauge micropuncture needle. A permanent ultrasound image was  captured and stored in the record. Micropuncture sheath was inserted followed by placement of a Wholey wire was directed to the inferior vena cava under fluoroscopic guidance. An 8 [place] vascular sheath was placed. A pigtail catheter was inserted to the peripheral inferior vena cava and inferior vena cavagram was performed. Inferior vena cavagram was significant for presence of an infrarenal IVC filter with possible nonocclusive thrombus about the apex of the filter with sluggish antegrade flow. Given failure of anticoagulation and indwelling IVC filter with presence of thrombus, decision was made to remove the indwelling filter. Therefore, the right internal jugular vein was accessed with a 21 gauge micropuncture needle. A permanent ultrasound image was captured and stored in the record. A micropuncture sheath was introduced through which a Wholey wire was advanced to the peripheral inferior vena cava. Serial dilation was performed followed by introduction of an argon triple loop snare IVC filter retrieval coaxial kit. The snare was then used to capture the apex of the filter which was retrieved without complication. Repeat inferior vena cavagram was performed which demonstrated patency of the normal caliber inferior vena cava without evidence of filling defect or extravasation. Attention was then turned toward PE thrombectomy. In pre close fashion, 2 ProGlides at the 10 o'clock and 2 o'clock positions. Over the wire, a 24 [place] Inari sheath was then placed and directed to the inferior vena cava. Over the wire, a double angle pigtail catheter was inserted and under fluoroscopic  guidance was attempted to be directed through the right atrium and right ventricle to the main pulmonary artery, however due to the size of the right heart this was difficult. Therefore, a Swan-Ganz catheter was inserted with the balloon inflated which was floated to the main pulmonary artery. Central manometry was performed at this  location with pulmonary arterial pressure of 70/38 with a mean of 45 mm Hg. The balloon was deflated and over a 0.025 guidewire the Swan-Ganz catheter was exchanged for a 4 French angled tip glide catheter. A Wholey wire was then inserted and directed into the left inferior pulmonary artery. The catheter was advanced to this location. The catheter was then exchanged for a 6 French angled tip select catheter. The wire was exchanged for a short taper superstiff Amplatz wire. The catheter was removed. A 24 [place] Flowtreiver aspiration catheter was then directed under fluoroscopic guidance to the main pulmonary artery. The inner dilator was removed. Pulmonary angiogram was then performed which demonstrated similar filling defects in the main and bilateral pulmonary arteries compatible with acute pulmonary embolism. The aspiration catheter was then advanced to the left pulmonary artery. Multiple aspirations were performed which yielded a large volume of acute appearing thrombus. Left pulmonary angiogram was then repeated which demonstrated significantly improved patency and perfusion of the left lung. There is a small volume additional thrombus in the proximal left inferior lobar pulmonary artery. Therefore, the T20 curved aspiration catheter was advanced in coaxial fashion and aspiration was performed which yielded additional small volume acute appearing thrombus. The coaxial system was then directed to the right pulmonary artery in the wire was inserted into the right inferior lobar pulmonary artery. Aspiration thrombectomy was then performed which yielded large volume acute appearing thrombus. The large volume thrombus was affixed to the distal tip of the aspiration catheter and required removal of the coaxial aspiration device over the wire and Ree insertion. At this point, wire access to the right pulmonary artery was lost. Given significant improvement in vital signs and large volume thrombus, the pulmonary  artery was not selected for completion measurements. At this point, the patient's tachycardia improved and oxygen saturations improved. The catheters were removed. Repeat inferior vena cavagram was then performed for planning purposes which demonstrated patency and again no evidence of extravasation. Over the wire, an option elite IVC filter was placed in an infrarenal location under fluoroscopic visualization. The sheath was then removed and the ProGlides were tied and cut. There was insufficient hemostasis, therefore a 0 Prolene pursestring stitch was placed about the right groin access site. Hemostasis was achieved at the right neck and a pressure dressing was applied. The patient tolerated the procedure well and was transferred to the ICU in good condition. IMPRESSION: 1. Inferior vena cavogram demonstrates indwelling inferior vena cava filter with possible adherent thrombus. 2. Technically successful inferior vena cava filter retrieval. 3. Successful bilateral pulmonary artery thrombectomy with removal of large volume acute appearing thrombus. 4. Technically successful inferior vena cava filter placement. Ester Sides, MD [facility] Radiology Electronically Signed   By: Ester Sides M.D.   On: 07/13/2024 08:49   IR US  Guide Vasc Access Right Result Date: 07/13/2024 INDICATION: [age]-year-old male with history of acute, high risk pulmonary embolism. EXAM: 1. Ultrasound-guided vascular access of the right common femoral vein. 2. Inferior vena cavogram. 3. Ultrasound-guided vascular access of the right internal jugular vein. 4. Inferior vena cava filter retrieval. 5. Pulmonary angiogram. 6. Central manometry. 7. Bilateral pulmonary artery aspiration  thrombectomy. 8. Inferior vena cava filter placement. COMPARISON:  CTA chest from earlier the same day MEDICATIONS: 10,000 units heparin , intravenous ANESTHESIA/SEDATION: Moderate (conscious) sedation was  employed during this procedure. A total of Versed  3 mg and Fentanyl  0 mcg was administered intravenously. Moderate Sedation Time: 97 minutes. The patient's level of consciousness and vital signs were monitored continuously by radiology nursing throughout the procedure under my direct supervision. FLUOROSCOPY TIME:  Nine hundred sixteen mGy reference air kerma COMPLICATIONS: None immediate. TECHNIQUE: Informed written consent was obtained from the patient after a thorough discussion of the procedural risks, benefits and alternatives. All questions were addressed. Maximal Sterile Barrier Technique was utilized including caps, mask, sterile gowns, sterile gloves, sterile drape, hand hygiene and skin antiseptic. A timeout was performed prior to the initiation of the procedure. Preprocedure ultrasound evaluation demonstrated patency of the right common femoral vein. The procedure was planned. Subdermal Local anesthesia was administered 1% lidocaine . A small skin nick was made. Under direct ultrasound visualization, the right common femoral vein was accessed with a 21 gauge micropuncture needle. A permanent ultrasound image was captured and stored in the record. Micropuncture sheath was inserted followed by placement of a Wholey wire was directed to the inferior vena cava under fluoroscopic guidance. An 8 [place] vascular sheath was placed. A pigtail catheter was inserted to the peripheral inferior vena cava and inferior vena cavagram was performed. Inferior vena cavagram was significant for presence of an infrarenal IVC filter with possible nonocclusive thrombus about the apex of the filter with sluggish antegrade flow. Given failure of anticoagulation and indwelling IVC filter with presence of thrombus, decision was made to remove the indwelling filter. Therefore, the right internal jugular vein was accessed with a 21 gauge micropuncture needle. A permanent ultrasound image was captured and stored in the record. A  micropuncture sheath was introduced through which a Wholey wire was advanced to the peripheral inferior vena cava. Serial dilation was performed followed by introduction of an argon triple loop snare IVC filter retrieval coaxial kit. The snare was then used to capture the apex of the filter which was retrieved without complication. Repeat inferior vena cavagram was performed which demonstrated patency of the normal caliber inferior vena cava without evidence of filling defect or extravasation. Attention was then turned toward PE thrombectomy. In pre close fashion, 2 ProGlides at the 10 o'clock and 2 o'clock positions. Over the wire, a 24 [place] Inari sheath was then placed and directed to the inferior vena cava. Over the wire, a double angle pigtail catheter was inserted and under fluoroscopic guidance was attempted to be directed through the right atrium and right ventricle to the main pulmonary artery, however due to the size of the right heart this was difficult. Therefore, a Swan-Ganz catheter was inserted with the balloon inflated which was floated to the main pulmonary artery. Central manometry was performed at this location with pulmonary arterial pressure of 70/38 with a mean of 45 mm Hg. The balloon was deflated and over a 0.025 guidewire the Swan-Ganz catheter was exchanged for a 4 French angled tip glide catheter. A Wholey wire was then inserted and directed into the left inferior pulmonary artery. The catheter was advanced to this location. The catheter was then exchanged for a 6 French angled tip select catheter. The wire was exchanged for a short taper superstiff Amplatz wire. The catheter was removed. A 24 [place] Flowtreiver aspiration catheter was then directed under fluoroscopic guidance to the main pulmonary artery. The inner dilator was removed.  Pulmonary angiogram was then performed which demonstrated similar filling defects in the main and bilateral pulmonary arteries compatible with acute  pulmonary embolism. The aspiration catheter was then advanced to the left pulmonary artery. Multiple aspirations were performed which yielded a large volume of acute appearing thrombus. Left pulmonary angiogram was then repeated which demonstrated significantly improved patency and perfusion of the left lung. There is a small volume additional thrombus in the proximal left inferior lobar pulmonary artery. Therefore, the T20 curved aspiration catheter was advanced in coaxial fashion and aspiration was performed which yielded additional small volume acute appearing thrombus. The coaxial system was then directed to the right pulmonary artery in the wire was inserted into the right inferior lobar pulmonary artery. Aspiration thrombectomy was then performed which yielded large volume acute appearing thrombus. The large volume thrombus was affixed to the distal tip of the aspiration catheter and required removal of the coaxial aspiration device over the wire and Ree insertion. At this point, wire access to the right pulmonary artery was lost. Given significant improvement in vital signs and large volume thrombus, the pulmonary artery was not selected for completion measurements. At this point, the patient's tachycardia improved and oxygen saturations improved. The catheters were removed. Repeat inferior vena cavagram was then performed for planning purposes which demonstrated patency and again no evidence of extravasation. Over the wire, an option elite IVC filter was placed in an infrarenal location under fluoroscopic visualization. The sheath was then removed and the ProGlides were tied and cut. There was insufficient hemostasis, therefore a 0 Prolene pursestring stitch was placed about the right groin access site. Hemostasis was achieved at the right neck and a pressure dressing was applied. The patient tolerated the procedure well and was transferred to the ICU in good condition. IMPRESSION: 1. Inferior vena cavogram  demonstrates indwelling inferior vena cava filter with possible adherent thrombus. 2. Technically successful inferior vena cava filter retrieval. 3. Successful bilateral pulmonary artery thrombectomy with removal of large volume acute appearing thrombus. 4. Technically successful inferior vena cava filter placement. Ester Sides, MD [facility] Radiology Electronically Signed   By: Ester Sides M.D.   On: 07/13/2024 08:49   IR US  Guide Vasc Access Right Result Date: 07/13/2024 INDICATION: [age]-year-old male with history of acute, high risk pulmonary embolism. EXAM: 1. Ultrasound-guided vascular access of the right common femoral vein. 2. Inferior vena cavogram. 3. Ultrasound-guided vascular access of the right internal jugular vein. 4. Inferior vena cava filter retrieval. 5. Pulmonary angiogram. 6. Central manometry. 7. Bilateral pulmonary artery aspiration thrombectomy. 8. Inferior vena cava filter placement. COMPARISON:  CTA chest from earlier the same day MEDICATIONS: 10,000 units heparin , intravenous ANESTHESIA/SEDATION: Moderate (conscious) sedation was employed during this procedure. A total of Versed  3 mg and Fentanyl  0 mcg was administered intravenously. Moderate Sedation Time: 97 minutes. The patient's level of consciousness and vital signs were monitored continuously by radiology nursing throughout the procedure under my direct supervision. FLUOROSCOPY TIME:  Nine hundred sixteen mGy reference air kerma COMPLICATIONS: None immediate. TECHNIQUE: Informed written consent was obtained from the patient after a thorough discussion of the procedural risks, benefits and alternatives. All questions were addressed. Maximal Sterile Barrier Technique was utilized including caps, mask, sterile gowns, sterile gloves, sterile drape, hand hygiene and skin antiseptic. A timeout was performed prior to the initiation of the procedure. Preprocedure ultrasound  evaluation demonstrated patency of the right common femoral vein. The procedure was planned. Subdermal Local anesthesia was administered 1% lidocaine . A  small skin nick was made. Under direct ultrasound visualization, the right common femoral vein was accessed with a 21 gauge micropuncture needle. A permanent ultrasound image was captured and stored in the record. Micropuncture sheath was inserted followed by placement of a Wholey wire was directed to the inferior vena cava under fluoroscopic guidance. An 8 [place] vascular sheath was placed. A pigtail catheter was inserted to the peripheral inferior vena cava and inferior vena cavagram was performed. Inferior vena cavagram was significant for presence of an infrarenal IVC filter with possible nonocclusive thrombus about the apex of the filter with sluggish antegrade flow. Given failure of anticoagulation and indwelling IVC filter with presence of thrombus, decision was made to remove the indwelling filter. Therefore, the right internal jugular vein was accessed with a 21 gauge micropuncture needle. A permanent ultrasound image was captured and stored in the record. A micropuncture sheath was introduced through which a Wholey wire was advanced to the peripheral inferior vena cava. Serial dilation was performed followed by introduction of an argon triple loop snare IVC filter retrieval coaxial kit. The snare was then used to capture the apex of the filter which was retrieved without complication. Repeat inferior vena cavagram was performed which demonstrated patency of the normal caliber inferior vena cava without evidence of filling defect or extravasation. Attention was then turned toward PE thrombectomy. In pre close fashion, 2 ProGlides at the 10 o'clock and 2 o'clock positions. Over the wire, a 24 [place] Inari sheath was then placed and directed to the inferior vena cava. Over the wire, a double angle pigtail catheter was inserted and under fluoroscopic guidance  was attempted to be directed through the right atrium and right ventricle to the main pulmonary artery, however due to the size of the right heart this was difficult. Therefore, a Swan-Ganz catheter was inserted with the balloon inflated which was floated to the main pulmonary artery. Central manometry was performed at this location with pulmonary arterial pressure of 70/38 with a mean of 45 mm Hg. The balloon was deflated and over a 0.025 guidewire the Swan-Ganz catheter was exchanged for a 4 French angled tip glide catheter. A Wholey wire was then inserted and directed into the left inferior pulmonary artery. The catheter was advanced to this location. The catheter was then exchanged for a 6 French angled tip select catheter. The wire was exchanged for a short taper superstiff Amplatz wire. The catheter was removed. A 24 [place] Flowtreiver aspiration catheter was then directed under fluoroscopic guidance to the main pulmonary artery. The inner dilator was removed. Pulmonary angiogram was then performed which demonstrated similar filling defects in the main and bilateral pulmonary arteries compatible with acute pulmonary embolism. The aspiration catheter was then advanced to the left pulmonary artery. Multiple aspirations were performed which yielded a large volume of acute appearing thrombus. Left pulmonary angiogram was then repeated which demonstrated significantly improved patency and perfusion of the left lung. There is a small volume additional thrombus in the proximal left inferior lobar pulmonary artery. Therefore, the T20 curved aspiration catheter was advanced in coaxial fashion and aspiration was performed which yielded additional small volume acute appearing thrombus. The coaxial system was then directed to the right pulmonary artery in the wire was inserted into the right inferior lobar pulmonary artery. Aspiration thrombectomy was then performed which yielded large volume acute appearing thrombus.  The large volume thrombus was affixed to the distal tip of the aspiration catheter and required removal of the coaxial aspiration device over the  wire and Ree insertion. At this point, wire access to the right pulmonary artery was lost. Given significant improvement in vital signs and large volume thrombus, the pulmonary artery was not selected for completion measurements. At this point, the patient's tachycardia improved and oxygen saturations improved. The catheters were removed. Repeat inferior vena cavagram was then performed for planning purposes which demonstrated patency and again no evidence of extravasation. Over the wire, an option elite IVC filter was placed in an infrarenal location under fluoroscopic visualization. The sheath was then removed and the ProGlides were tied and cut. There was insufficient hemostasis, therefore a 0 Prolene pursestring stitch was placed about the right groin access site. Hemostasis was achieved at the right neck and a pressure dressing was applied. The patient tolerated the procedure well and was transferred to the ICU in good condition. IMPRESSION: 1. Inferior vena cavogram demonstrates indwelling inferior vena cava filter with possible adherent thrombus. 2. Technically successful inferior vena cava filter retrieval. 3. Successful bilateral pulmonary artery thrombectomy with removal of large volume acute appearing thrombus. 4. Technically successful inferior vena cava filter placement. Ester Sides, MD [facility] Radiology Electronically Signed   By: Ester Sides M.D.   On: 07/13/2024 08:49   IR IVC FILTER PLMT / S&I PORTER GUID/MOD SED Result Date: 07/13/2024 INDICATION: [age]-year-old male with history of acute, high risk pulmonary embolism. EXAM: 1. Ultrasound-guided vascular access of the right common femoral vein. 2. Inferior vena cavogram. 3. Ultrasound-guided vascular access of the right internal jugular vein.  4. Inferior vena cava filter retrieval. 5. Pulmonary angiogram. 6. Central manometry. 7. Bilateral pulmonary artery aspiration thrombectomy. 8. Inferior vena cava filter placement. COMPARISON:  CTA chest from earlier the same day MEDICATIONS: 10,000 units heparin , intravenous ANESTHESIA/SEDATION: Moderate (conscious) sedation was employed during this procedure. A total of Versed  3 mg and Fentanyl  0 mcg was administered intravenously. Moderate Sedation Time: 97 minutes. The patient's level of consciousness and vital signs were monitored continuously by radiology nursing throughout the procedure under my direct supervision. FLUOROSCOPY TIME:  Nine hundred sixteen mGy reference air kerma COMPLICATIONS: None immediate. TECHNIQUE: Informed written consent was obtained from the patient after a thorough discussion of the procedural risks, benefits and alternatives. All questions were addressed. Maximal Sterile Barrier Technique was utilized including caps, mask, sterile gowns, sterile gloves, sterile drape, hand hygiene and skin antiseptic. A timeout was performed prior to the initiation of the procedure. Preprocedure ultrasound evaluation demonstrated patency of the right common femoral vein. The procedure was planned. Subdermal Local anesthesia was administered 1% lidocaine . A small skin nick was made. Under direct ultrasound visualization, the right common femoral vein was accessed with a 21 gauge micropuncture needle. A permanent ultrasound image was captured and stored in the record. Micropuncture sheath was inserted followed by placement of a Wholey wire was directed to the inferior vena cava under fluoroscopic guidance. An 8 [place] vascular sheath was placed. A pigtail catheter was inserted to the peripheral inferior vena cava and inferior vena cavagram was performed. Inferior vena cavagram was significant for presence of an infrarenal IVC filter with possible nonocclusive thrombus about the apex of the filter with  sluggish antegrade flow. Given failure of anticoagulation and indwelling IVC filter with presence of thrombus, decision was made to remove the indwelling filter. Therefore, the right internal jugular vein was accessed with a 21 gauge micropuncture needle. A permanent ultrasound image was captured and stored in the record. A micropuncture sheath was introduced through which a [employer]  wire was advanced to the peripheral inferior vena cava. Serial dilation was performed followed by introduction of an argon triple loop snare IVC filter retrieval coaxial kit. The snare was then used to capture the apex of the filter which was retrieved without complication. Repeat inferior vena cavagram was performed which demonstrated patency of the normal caliber inferior vena cava without evidence of filling defect or extravasation. Attention was then turned toward PE thrombectomy. In pre close fashion, 2 ProGlides at the 10 o'clock and 2 o'clock positions. Over the wire, a 24 [place] Inari sheath was then placed and directed to the inferior vena cava. Over the wire, a double angle pigtail catheter was inserted and under fluoroscopic guidance was attempted to be directed through the right atrium and right ventricle to the main pulmonary artery, however due to the size of the right heart this was difficult. Therefore, a Swan-Ganz catheter was inserted with the balloon inflated which was floated to the main pulmonary artery. Central manometry was performed at this location with pulmonary arterial pressure of 70/38 with a mean of 45 mm Hg. The balloon was deflated and over a 0.025 guidewire the Swan-Ganz catheter was exchanged for a 4 French angled tip glide catheter. A Wholey wire was then inserted and directed into the left inferior pulmonary artery. The catheter was advanced to this location. The catheter was then exchanged for a 6 French angled tip select catheter. The wire was exchanged for a short taper superstiff Amplatz wire. The  catheter was removed. A 24 [place] Flowtreiver aspiration catheter was then directed under fluoroscopic guidance to the main pulmonary artery. The inner dilator was removed. Pulmonary angiogram was then performed which demonstrated similar filling defects in the main and bilateral pulmonary arteries compatible with acute pulmonary embolism. The aspiration catheter was then advanced to the left pulmonary artery. Multiple aspirations were performed which yielded a large volume of acute appearing thrombus. Left pulmonary angiogram was then repeated which demonstrated significantly improved patency and perfusion of the left lung. There is a small volume additional thrombus in the proximal left inferior lobar pulmonary artery. Therefore, the T20 curved aspiration catheter was advanced in coaxial fashion and aspiration was performed which yielded additional small volume acute appearing thrombus. The coaxial system was then directed to the right pulmonary artery in the wire was inserted into the right inferior lobar pulmonary artery. Aspiration thrombectomy was then performed which yielded large volume acute appearing thrombus. The large volume thrombus was affixed to the distal tip of the aspiration catheter and required removal of the coaxial aspiration device over the wire and Ree insertion. At this point, wire access to the right pulmonary artery was lost. Given significant improvement in vital signs and large volume thrombus, the pulmonary artery was not selected for completion measurements. At this point, the patient's tachycardia improved and oxygen saturations improved. The catheters were removed. Repeat inferior vena cavagram was then performed for planning purposes which demonstrated patency and again no evidence of extravasation. Over the wire, an option elite IVC filter was placed in an infrarenal location under fluoroscopic visualization. The sheath was then removed and the ProGlides were tied and cut. There  was insufficient hemostasis, therefore a 0 Prolene pursestring stitch was placed about the right groin access site. Hemostasis was achieved at the right neck and a pressure dressing was applied. The patient tolerated the procedure well and was transferred to the ICU in good condition. IMPRESSION: 1. Inferior vena cavogram demonstrates indwelling inferior vena cava filter with possible adherent  thrombus. 2. Technically successful inferior vena cava filter retrieval. 3. Successful bilateral pulmonary artery thrombectomy with removal of large volume acute appearing thrombus. 4. Technically successful inferior vena cava filter placement. Ester Sides, MD [facility] Radiology Electronically Signed   By: Ester Sides M.D.   On: 07/13/2024 08:49   IR IVC Filter Retrieval / S&I /Img Guid/Mod Sed Result Date: 07/13/2024 INDICATION: [age]-year-old male with history of acute, high risk pulmonary embolism. EXAM: 1. Ultrasound-guided vascular access of the right common femoral vein. 2. Inferior vena cavogram. 3. Ultrasound-guided vascular access of the right internal jugular vein. 4. Inferior vena cava filter retrieval. 5. Pulmonary angiogram. 6. Central manometry. 7. Bilateral pulmonary artery aspiration thrombectomy. 8. Inferior vena cava filter placement. COMPARISON:  CTA chest from earlier the same day MEDICATIONS: 10,000 units heparin , intravenous ANESTHESIA/SEDATION: Moderate (conscious) sedation was employed during this procedure. A total of Versed  3 mg and Fentanyl  0 mcg was administered intravenously. Moderate Sedation Time: 97 minutes. The patient's level of consciousness and vital signs were monitored continuously by radiology nursing throughout the procedure under my direct supervision. FLUOROSCOPY TIME:  Nine hundred sixteen mGy reference air kerma COMPLICATIONS: None immediate. TECHNIQUE: Informed written consent was obtained from the patient after a  thorough discussion of the procedural risks, benefits and alternatives. All questions were addressed. Maximal Sterile Barrier Technique was utilized including caps, mask, sterile gowns, sterile gloves, sterile drape, hand hygiene and skin antiseptic. A timeout was performed prior to the initiation of the procedure. Preprocedure ultrasound evaluation demonstrated patency of the right common femoral vein. The procedure was planned. Subdermal Local anesthesia was administered 1% lidocaine . A small skin nick was made. Under direct ultrasound visualization, the right common femoral vein was accessed with a 21 gauge micropuncture needle. A permanent ultrasound image was captured and stored in the record. Micropuncture sheath was inserted followed by placement of a Wholey wire was directed to the inferior vena cava under fluoroscopic guidance. An 8 [place] vascular sheath was placed. A pigtail catheter was inserted to the peripheral inferior vena cava and inferior vena cavagram was performed. Inferior vena cavagram was significant for presence of an infrarenal IVC filter with possible nonocclusive thrombus about the apex of the filter with sluggish antegrade flow. Given failure of anticoagulation and indwelling IVC filter with presence of thrombus, decision was made to remove the indwelling filter. Therefore, the right internal jugular vein was accessed with a 21 gauge micropuncture needle. A permanent ultrasound image was captured and stored in the record. A micropuncture sheath was introduced through which a Wholey wire was advanced to the peripheral inferior vena cava. Serial dilation was performed followed by introduction of an argon triple loop snare IVC filter retrieval coaxial kit. The snare was then used to capture the apex of the filter which was retrieved without complication. Repeat inferior vena cavagram was performed which demonstrated patency of the normal caliber inferior vena cava without evidence of filling  defect or extravasation. Attention was then turned toward PE thrombectomy. In pre close fashion, 2 ProGlides at the 10 o'clock and 2 o'clock positions. Over the wire, a 24 [place] Inari sheath was then placed and directed to the inferior vena cava. Over the wire, a double angle pigtail catheter was inserted and under fluoroscopic guidance was attempted to be directed through the right atrium and right ventricle to the main pulmonary artery, however due to the size of the right heart this was difficult. Therefore, a Swan-Ganz catheter was inserted with the balloon  inflated which was floated to the main pulmonary artery. Central manometry was performed at this location with pulmonary arterial pressure of 70/38 with a mean of 45 mm Hg. The balloon was deflated and over a 0.025 guidewire the Swan-Ganz catheter was exchanged for a 4 French angled tip glide catheter. A Wholey wire was then inserted and directed into the left inferior pulmonary artery. The catheter was advanced to this location. The catheter was then exchanged for a 6 French angled tip select catheter. The wire was exchanged for a short taper superstiff Amplatz wire. The catheter was removed. A 24 [place] Flowtreiver aspiration catheter was then directed under fluoroscopic guidance to the main pulmonary artery. The inner dilator was removed. Pulmonary angiogram was then performed which demonstrated similar filling defects in the main and bilateral pulmonary arteries compatible with acute pulmonary embolism. The aspiration catheter was then advanced to the left pulmonary artery. Multiple aspirations were performed which yielded a large volume of acute appearing thrombus. Left pulmonary angiogram was then repeated which demonstrated significantly improved patency and perfusion of the left lung. There is a small volume additional thrombus in the proximal left inferior lobar pulmonary artery. Therefore, the T20 curved aspiration catheter was advanced in coaxial  fashion and aspiration was performed which yielded additional small volume acute appearing thrombus. The coaxial system was then directed to the right pulmonary artery in the wire was inserted into the right inferior lobar pulmonary artery. Aspiration thrombectomy was then performed which yielded large volume acute appearing thrombus. The large volume thrombus was affixed to the distal tip of the aspiration catheter and required removal of the coaxial aspiration device over the wire and Ree insertion. At this point, wire access to the right pulmonary artery was lost. Given significant improvement in vital signs and large volume thrombus, the pulmonary artery was not selected for completion measurements. At this point, the patient's tachycardia improved and oxygen saturations improved. The catheters were removed. Repeat inferior vena cavagram was then performed for planning purposes which demonstrated patency and again no evidence of extravasation. Over the wire, an option elite IVC filter was placed in an infrarenal location under fluoroscopic visualization. The sheath was then removed and the ProGlides were tied and cut. There was insufficient hemostasis, therefore a 0 Prolene pursestring stitch was placed about the right groin access site. Hemostasis was achieved at the right neck and a pressure dressing was applied. The patient tolerated the procedure well and was transferred to the ICU in good condition. IMPRESSION: 1. Inferior vena cavogram demonstrates indwelling inferior vena cava filter with possible adherent thrombus. 2. Technically successful inferior vena cava filter retrieval. 3. Successful bilateral pulmonary artery thrombectomy with removal of large volume acute appearing thrombus. 4. Technically successful inferior vena cava filter placement. Ester Sides, MD [facility]iology Electronically Signed   By: Ester Sides M.D.   On: 07/13/2024 08:49    IR Venocavagram Ivc Result Date: 07/13/2024 INDICATION: [age]-year-old male with history of acute, high risk pulmonary embolism. EXAM: 1. Ultrasound-guided vascular access of the right common femoral vein. 2. Inferior vena cavogram. 3. Ultrasound-guided vascular access of the right internal jugular vein. 4. Inferior vena cava filter retrieval. 5. Pulmonary angiogram. 6. Central manometry. 7. Bilateral pulmonary artery aspiration thrombectomy. 8. Inferior vena cava filter placement. COMPARISON:  CTA chest from earlier the same day MEDICATIONS: 10,000 units heparin , intravenous ANESTHESIA/SEDATION: Moderate (conscious) sedation was employed during this procedure. A total of Versed  3 mg and Fentanyl  0 mcg was administered  intravenously. Moderate Sedation Time: 97 minutes. The patient's level of consciousness and vital signs were monitored continuously by radiology nursing throughout the procedure under my direct supervision. FLUOROSCOPY TIME:  Nine hundred sixteen mGy reference air kerma COMPLICATIONS: None immediate. TECHNIQUE: Informed written consent was obtained from the patient after a thorough discussion of the procedural risks, benefits and alternatives. All questions were addressed. Maximal Sterile Barrier Technique was utilized including caps, mask, sterile gowns, sterile gloves, sterile drape, hand hygiene and skin antiseptic. A timeout was performed prior to the initiation of the procedure. Preprocedure ultrasound evaluation demonstrated patency of the right common femoral vein. The procedure was planned. Subdermal Local anesthesia was administered 1% lidocaine . A small skin nick was made. Under direct ultrasound visualization, the right common femoral vein was accessed with a 21 gauge micropuncture needle. A permanent ultrasound image was captured and stored in the record. Micropuncture sheath was inserted followed by placement of a Wholey wire was directed to the inferior vena cava under  fluoroscopic guidance. An 8 [place] vascular sheath was placed. A pigtail catheter was inserted to the peripheral inferior vena cava and inferior vena cavagram was performed. Inferior vena cavagram was significant for presence of an infrarenal IVC filter with possible nonocclusive thrombus about the apex of the filter with sluggish antegrade flow. Given failure of anticoagulation and indwelling IVC filter with presence of thrombus, decision was made to remove the indwelling filter. Therefore, the right internal jugular vein was accessed with a 21 gauge micropuncture needle. A permanent ultrasound image was captured and stored in the record. A micropuncture sheath was introduced through which a Wholey wire was advanced to the peripheral inferior vena cava. Serial dilation was performed followed by introduction of an argon triple loop snare IVC filter retrieval coaxial kit. The snare was then used to capture the apex of the filter which was retrieved without complication. Repeat inferior vena cavagram was performed which demonstrated patency of the normal caliber inferior vena cava without evidence of filling defect or extravasation. Attention was then turned toward PE thrombectomy. In pre close fashion, 2 ProGlides at the 10 o'clock and 2 o'clock positions. Over the wire, a 24 [place] Inari sheath was then placed and directed to the inferior vena cava. Over the wire, a double angle pigtail catheter was inserted and under fluoroscopic guidance was attempted to be directed through the right atrium and right ventricle to the main pulmonary artery, however due to the size of the right heart this was difficult. Therefore, a Swan-Ganz catheter was inserted with the balloon inflated which was floated to the main pulmonary artery. Central manometry was performed at this location with pulmonary arterial pressure of 70/38 with a mean of 45 mm Hg. The balloon was deflated and over a 0.025 guidewire the Swan-Ganz catheter was  exchanged for a 4 French angled tip glide catheter. A Wholey wire was then inserted and directed into the left inferior pulmonary artery. The catheter was advanced to this location. The catheter was then exchanged for a 6 French angled tip select catheter. The wire was exchanged for a short taper superstiff Amplatz wire. The catheter was removed. A 24 [place] Flowtreiver aspiration catheter was then directed under fluoroscopic guidance to the main pulmonary artery. The inner dilator was removed. Pulmonary angiogram was then performed which demonstrated similar filling defects in the main and bilateral pulmonary arteries compatible with acute pulmonary embolism. The aspiration catheter was then advanced to the left pulmonary artery. Multiple aspirations were performed which yielded a large volume  of acute appearing thrombus. Left pulmonary angiogram was then repeated which demonstrated significantly improved patency and perfusion of the left lung. There is a small volume additional thrombus in the proximal left inferior lobar pulmonary artery. Therefore, the T20 curved aspiration catheter was advanced in coaxial fashion and aspiration was performed which yielded additional small volume acute appearing thrombus. The coaxial system was then directed to the right pulmonary artery in the wire was inserted into the right inferior lobar pulmonary artery. Aspiration thrombectomy was then performed which yielded large volume acute appearing thrombus. The large volume thrombus was affixed to the distal tip of the aspiration catheter and required removal of the coaxial aspiration device over the wire and Ree insertion. At this point, wire access to the right pulmonary artery was lost. Given significant improvement in vital signs and large volume thrombus, the pulmonary artery was not selected for completion measurements. At this point, the patient's tachycardia improved and oxygen saturations improved. The catheters were  removed. Repeat inferior vena cavagram was then performed for planning purposes which demonstrated patency and again no evidence of extravasation. Over the wire, an option elite IVC filter was placed in an infrarenal location under fluoroscopic visualization. The sheath was then removed and the ProGlides were tied and cut. There was insufficient hemostasis, therefore a 0 Prolene pursestring stitch was placed about the right groin access site. Hemostasis was achieved at the right neck and a pressure dressing was applied. The patient tolerated the procedure well and was transferred to the ICU in good condition. IMPRESSION: 1. Inferior vena cavogram demonstrates indwelling inferior vena cava filter with possible adherent thrombus. 2. Technically successful inferior vena cava filter retrieval. 3. Successful bilateral pulmonary artery thrombectomy with removal of large volume acute appearing thrombus. 4. Technically successful inferior vena cava filter placement. Ester Sides, MD [facility] Radiology Electronically Signed   By: Ester Sides M.D.   On: 07/13/2024 08:49   IR THROMBECT PRIM MECH ADD (INCLU) MOD SED Result Date: 07/13/2024 INDICATION: [age]-year-old male with history of acute, high risk pulmonary embolism. EXAM: 1. Ultrasound-guided vascular access of the right common femoral vein. 2. Inferior vena cavogram. 3. Ultrasound-guided vascular access of the right internal jugular vein. 4. Inferior vena cava filter retrieval. 5. Pulmonary angiogram. 6. Central manometry. 7. Bilateral pulmonary artery aspiration thrombectomy. 8. Inferior vena cava filter placement. COMPARISON:  CTA chest from earlier the same day MEDICATIONS: 10,000 units heparin , intravenous ANESTHESIA/SEDATION: Moderate (conscious) sedation was employed during this procedure. A total of Versed  3 mg and Fentanyl  0 mcg was administered intravenously. Moderate Sedation Time: 97 minutes. The  patient's level of consciousness and vital signs were monitored continuously by radiology nursing throughout the procedure under my direct supervision. FLUOROSCOPY TIME:  Nine hundred sixteen mGy reference air kerma COMPLICATIONS: None immediate. TECHNIQUE: Informed written consent was obtained from the patient after a thorough discussion of the procedural risks, benefits and alternatives. All questions were addressed. Maximal Sterile Barrier Technique was utilized including caps, mask, sterile gowns, sterile gloves, sterile drape, hand hygiene and skin antiseptic. A timeout was performed prior to the initiation of the procedure. Preprocedure ultrasound evaluation demonstrated patency of the right common femoral vein. The procedure was planned. Subdermal Local anesthesia was administered 1% lidocaine . A small skin nick was made. Under direct ultrasound visualization, the right common femoral vein was accessed with a 21 gauge micropuncture needle. A permanent ultrasound image was captured and stored in the record. Micropuncture sheath was inserted followed by placement of  a Wholey wire was directed to the inferior vena cava under fluoroscopic guidance. An 8 [place] vascular sheath was placed. A pigtail catheter was inserted to the peripheral inferior vena cava and inferior vena cavagram was performed. Inferior vena cavagram was significant for presence of an infrarenal IVC filter with possible nonocclusive thrombus about the apex of the filter with sluggish antegrade flow. Given failure of anticoagulation and indwelling IVC filter with presence of thrombus, decision was made to remove the indwelling filter. Therefore, the right internal jugular vein was accessed with a 21 gauge micropuncture needle. A permanent ultrasound image was captured and stored in the record. A micropuncture sheath was introduced through which a Wholey wire was advanced to the peripheral inferior vena cava. Serial dilation was performed  followed by introduction of an argon triple loop snare IVC filter retrieval coaxial kit. The snare was then used to capture the apex of the filter which was retrieved without complication. Repeat inferior vena cavagram was performed which demonstrated patency of the normal caliber inferior vena cava without evidence of filling defect or extravasation. Attention was then turned toward PE thrombectomy. In pre close fashion, 2 ProGlides at the 10 o'clock and 2 o'clock positions. Over the wire, a 24 [place] Inari sheath was then placed and directed to the inferior vena cava. Over the wire, a double angle pigtail catheter was inserted and under fluoroscopic guidance was attempted to be directed through the right atrium and right ventricle to the main pulmonary artery, however due to the size of the right heart this was difficult. Therefore, a Swan-Ganz catheter was inserted with the balloon inflated which was floated to the main pulmonary artery. Central manometry was performed at this location with pulmonary arterial pressure of 70/38 with a mean of 45 mm Hg. The balloon was deflated and over a 0.025 guidewire the Swan-Ganz catheter was exchanged for a 4 French angled tip glide catheter. A Wholey wire was then inserted and directed into the left inferior pulmonary artery. The catheter was advanced to this location. The catheter was then exchanged for a 6 French angled tip select catheter. The wire was exchanged for a short taper superstiff Amplatz wire. The catheter was removed. A 24 [place] Flowtreiver aspiration catheter was then directed under fluoroscopic guidance to the main pulmonary artery. The inner dilator was removed. Pulmonary angiogram was then performed which demonstrated similar filling defects in the main and bilateral pulmonary arteries compatible with acute pulmonary embolism. The aspiration catheter was then advanced to the left pulmonary artery. Multiple aspirations were performed which yielded a large  volume of acute appearing thrombus. Left pulmonary angiogram was then repeated which demonstrated significantly improved patency and perfusion of the left lung. There is a small volume additional thrombus in the proximal left inferior lobar pulmonary artery. Therefore, the T20 curved aspiration catheter was advanced in coaxial fashion and aspiration was performed which yielded additional small volume acute appearing thrombus. The coaxial system was then directed to the right pulmonary artery in the wire was inserted into the right inferior lobar pulmonary artery. Aspiration thrombectomy was then performed which yielded large volume acute appearing thrombus. The large volume thrombus was affixed to the distal tip of the aspiration catheter and required removal of the coaxial aspiration device over the wire and Ree insertion. At this point, wire access to the right pulmonary artery was lost. Given significant improvement in vital signs and large volume thrombus, the pulmonary artery was not selected for completion measurements. At this point, the patient's tachycardia  improved and oxygen saturations improved. The catheters were removed. Repeat inferior vena cavagram was then performed for planning purposes which demonstrated patency and again no evidence of extravasation. Over the wire, an option elite IVC filter was placed in an infrarenal location under fluoroscopic visualization. The sheath was then removed and the ProGlides were tied and cut. There was insufficient hemostasis, therefore a 0 Prolene pursestring stitch was placed about the right groin access site. Hemostasis was achieved at the right neck and a pressure dressing was applied. The patient tolerated the procedure well and was transferred to the ICU in good condition. IMPRESSION: 1. Inferior vena cavogram demonstrates indwelling inferior vena cava filter with possible adherent thrombus. 2. Technically successful inferior vena cava filter retrieval. 3.  Successful bilateral pulmonary artery thrombectomy with removal of large volume acute appearing thrombus. 4. Technically successful inferior vena cava filter placement. Ester Sides, MD [facility]iology Electronically Signed   By: Ester Sides M.D.   On: 07/13/2024 08:49   IR THROMBECT PRIM MECH ADD (INCLU) MOD SED Result Date: 07/13/2024 INDICATION: [age]-year-old male with history of acute, high risk pulmonary embolism. EXAM: 1. Ultrasound-guided vascular access of the right common femoral vein. 2. Inferior vena cavogram. 3. Ultrasound-guided vascular access of the right internal jugular vein. 4. Inferior vena cava filter retrieval. 5. Pulmonary angiogram. 6. Central manometry. 7. Bilateral pulmonary artery aspiration thrombectomy. 8. Inferior vena cava filter placement. COMPARISON:  CTA chest from earlier the same day MEDICATIONS: 10,000 units heparin , intravenous ANESTHESIA/SEDATION: Moderate (conscious) sedation was employed during this procedure. A total of Versed  3 mg and Fentanyl  0 mcg was administered intravenously. Moderate Sedation Time: 97 minutes. The patient's level of consciousness and vital signs were monitored continuously by radiology nursing throughout the procedure under my direct supervision. FLUOROSCOPY TIME:  Nine hundred sixteen mGy reference air kerma COMPLICATIONS: None immediate. TECHNIQUE: Informed written consent was obtained from the patient after a thorough discussion of the procedural risks, benefits and alternatives. All questions were addressed. Maximal Sterile Barrier Technique was utilized including caps, mask, sterile gowns, sterile gloves, sterile drape, hand hygiene and skin antiseptic. A timeout was performed prior to the initiation of the procedure. Preprocedure ultrasound evaluation demonstrated patency of the right common femoral vein. The procedure was planned. Subdermal Local anesthesia was administered 1%  lidocaine . A small skin nick was made. Under direct ultrasound visualization, the right common femoral vein was accessed with a 21 gauge micropuncture needle. A permanent ultrasound image was captured and stored in the record. Micropuncture sheath was inserted followed by placement of a Wholey wire was directed to the inferior vena cava under fluoroscopic guidance. An 8 [place] vascular sheath was placed. A pigtail catheter was inserted to the peripheral inferior vena cava and inferior vena cavagram was performed. Inferior vena cavagram was significant for presence of an infrarenal IVC filter with possible nonocclusive thrombus about the apex of the filter with sluggish antegrade flow. Given failure of anticoagulation and indwelling IVC filter with presence of thrombus, decision was made to remove the indwelling filter. Therefore, the right internal jugular vein was accessed with a 21 gauge micropuncture needle. A permanent ultrasound image was captured and stored in the record. A micropuncture sheath was introduced through which a Wholey wire was advanced to the peripheral inferior vena cava. Serial dilation was performed followed by introduction of an argon triple loop snare IVC filter retrieval coaxial kit. The snare was then used to capture the apex of the filter which was retrieved  without complication. Repeat inferior vena cavagram was performed which demonstrated patency of the normal caliber inferior vena cava without evidence of filling defect or extravasation. Attention was then turned toward PE thrombectomy. In pre close fashion, 2 ProGlides at the 10 o'clock and 2 o'clock positions. Over the wire, a 24 [place] Inari sheath was then placed and directed to the inferior vena cava. Over the wire, a double angle pigtail catheter was inserted and under fluoroscopic guidance was attempted to be directed through the right atrium and right ventricle to the main pulmonary artery, however due to the size of the right  heart this was difficult. Therefore, a Swan-Ganz catheter was inserted with the balloon inflated which was floated to the main pulmonary artery. Central manometry was performed at this location with pulmonary arterial pressure of 70/38 with a mean of 45 mm Hg. The balloon was deflated and over a 0.025 guidewire the Swan-Ganz catheter was exchanged for a 4 French angled tip glide catheter. A Wholey wire was then inserted and directed into the left inferior pulmonary artery. The catheter was advanced to this location. The catheter was then exchanged for a 6 French angled tip select catheter. The wire was exchanged for a short taper superstiff Amplatz wire. The catheter was removed. A 24 [place] Flowtreiver aspiration catheter was then directed under fluoroscopic guidance to the main pulmonary artery. The inner dilator was removed. Pulmonary angiogram was then performed which demonstrated similar filling defects in the main and bilateral pulmonary arteries compatible with acute pulmonary embolism. The aspiration catheter was then advanced to the left pulmonary artery. Multiple aspirations were performed which yielded a large volume of acute appearing thrombus. Left pulmonary angiogram was then repeated which demonstrated significantly improved patency and perfusion of the left lung. There is a small volume additional thrombus in the proximal left inferior lobar pulmonary artery. Therefore, the T20 curved aspiration catheter was advanced in coaxial fashion and aspiration was performed which yielded additional small volume acute appearing thrombus. The coaxial system was then directed to the right pulmonary artery in the wire was inserted into the right inferior lobar pulmonary artery. Aspiration thrombectomy was then performed which yielded large volume acute appearing thrombus. The large volume thrombus was affixed to the distal tip of the aspiration catheter and required removal of the coaxial aspiration device over  the wire and Ree insertion. At this point, wire access to the right pulmonary artery was lost. Given significant improvement in vital signs and large volume thrombus, the pulmonary artery was not selected for completion measurements. At this point, the patient's tachycardia improved and oxygen saturations improved. The catheters were removed. Repeat inferior vena cavagram was then performed for planning purposes which demonstrated patency and again no evidence of extravasation. Over the wire, an option elite IVC filter was placed in an infrarenal location under fluoroscopic visualization. The sheath was then removed and the ProGlides were tied and cut. There was insufficient hemostasis, therefore a 0 Prolene pursestring stitch was placed about the right groin access site. Hemostasis was achieved at the right neck and a pressure dressing was applied. The patient tolerated the procedure well and was transferred to the ICU in good condition. IMPRESSION: 1. Inferior vena cavogram demonstrates indwelling inferior vena cava filter with possible adherent thrombus. 2. Technically successful inferior vena cava filter retrieval. 3. Successful bilateral pulmonary artery thrombectomy with removal of large volume acute appearing thrombus. 4. Technically successful inferior vena cava filter placement. Ester Sides, MD [facility] Radiology Electronically  Signed   By: Ester Sides M.D.   On: 07/13/2024 08:49   IR Angiogram Pulmonary Bilateral Selective Result Date: 07/13/2024 INDICATION: [age]-year-old male with history of acute, high risk pulmonary embolism. EXAM: 1. Ultrasound-guided vascular access of the right common femoral vein. 2. Inferior vena cavogram. 3. Ultrasound-guided vascular access of the right internal jugular vein. 4. Inferior vena cava filter retrieval. 5. Pulmonary angiogram. 6. Central manometry. 7. Bilateral pulmonary artery aspiration thrombectomy. 8.  Inferior vena cava filter placement. COMPARISON:  CTA chest from earlier the same day MEDICATIONS: 10,000 units heparin , intravenous ANESTHESIA/SEDATION: Moderate (conscious) sedation was employed during this procedure. A total of Versed  3 mg and Fentanyl  0 mcg was administered intravenously. Moderate Sedation Time: 97 minutes. The patient's level of consciousness and vital signs were monitored continuously by radiology nursing throughout the procedure under my direct supervision. FLUOROSCOPY TIME:  Nine hundred sixteen mGy reference air kerma COMPLICATIONS: None immediate. TECHNIQUE: Informed written consent was obtained from the patient after a thorough discussion of the procedural risks, benefits and alternatives. All questions were addressed. Maximal Sterile Barrier Technique was utilized including caps, mask, sterile gowns, sterile gloves, sterile drape, hand hygiene and skin antiseptic. A timeout was performed prior to the initiation of the procedure. Preprocedure ultrasound evaluation demonstrated patency of the right common femoral vein. The procedure was planned. Subdermal Local anesthesia was administered 1% lidocaine . A small skin nick was made. Under direct ultrasound visualization, the right common femoral vein was accessed with a 21 gauge micropuncture needle. A permanent ultrasound image was captured and stored in the record. Micropuncture sheath was inserted followed by placement of a Wholey wire was directed to the inferior vena cava under fluoroscopic guidance. An 8 [place] vascular sheath was placed. A pigtail catheter was inserted to the peripheral inferior vena cava and inferior vena cavagram was performed. Inferior vena cavagram was significant for presence of an infrarenal IVC filter with possible nonocclusive thrombus about the apex of the filter with sluggish antegrade flow. Given failure of anticoagulation and indwelling IVC filter with presence of thrombus, decision was made to remove the  indwelling filter. Therefore, the right internal jugular vein was accessed with a 21 gauge micropuncture needle. A permanent ultrasound image was captured and stored in the record. A micropuncture sheath was introduced through which a Wholey wire was advanced to the peripheral inferior vena cava. Serial dilation was performed followed by introduction of an argon triple loop snare IVC filter retrieval coaxial kit. The snare was then used to capture the apex of the filter which was retrieved without complication. Repeat inferior vena cavagram was performed which demonstrated patency of the normal caliber inferior vena cava without evidence of filling defect or extravasation. Attention was then turned toward PE thrombectomy. In pre close fashion, 2 ProGlides at the 10 o'clock and 2 o'clock positions. Over the wire, a 24 [place] Inari sheath was then placed and directed to the inferior vena cava. Over the wire, a double angle pigtail catheter was inserted and under fluoroscopic guidance was attempted to be directed through the right atrium and right ventricle to the main pulmonary artery, however due to the size of the right heart this was difficult. Therefore, a Swan-Ganz catheter was inserted with the balloon inflated which was floated to the main pulmonary artery. Central manometry was performed at this location with pulmonary arterial pressure of 70/38 with a mean of 45 mm Hg. The balloon was deflated and over a 0.025 guidewire the Swan-Ganz catheter was exchanged for a 4  French angled tip glide catheter. A Wholey wire was then inserted and directed into the left inferior pulmonary artery. The catheter was advanced to this location. The catheter was then exchanged for a 6 French angled tip select catheter. The wire was exchanged for a short taper superstiff Amplatz wire. The catheter was removed. A 24 [place] Flowtreiver aspiration catheter was then directed under fluoroscopic guidance to the main pulmonary artery.  The inner dilator was removed. Pulmonary angiogram was then performed which demonstrated similar filling defects in the main and bilateral pulmonary arteries compatible with acute pulmonary embolism. The aspiration catheter was then advanced to the left pulmonary artery. Multiple aspirations were performed which yielded a large volume of acute appearing thrombus. Left pulmonary angiogram was then repeated which demonstrated significantly improved patency and perfusion of the left lung. There is a small volume additional thrombus in the proximal left inferior lobar pulmonary artery. Therefore, the T20 curved aspiration catheter was advanced in coaxial fashion and aspiration was performed which yielded additional small volume acute appearing thrombus. The coaxial system was then directed to the right pulmonary artery in the wire was inserted into the right inferior lobar pulmonary artery. Aspiration thrombectomy was then performed which yielded large volume acute appearing thrombus. The large volume thrombus was affixed to the distal tip of the aspiration catheter and required removal of the coaxial aspiration device over the wire and Ree insertion. At this point, wire access to the right pulmonary artery was lost. Given significant improvement in vital signs and large volume thrombus, the pulmonary artery was not selected for completion measurements. At this point, the patient's tachycardia improved and oxygen saturations improved. The catheters were removed. Repeat inferior vena cavagram was then performed for planning purposes which demonstrated patency and again no evidence of extravasation. Over the wire, an option elite IVC filter was placed in an infrarenal location under fluoroscopic visualization. The sheath was then removed and the ProGlides were tied and cut. There was insufficient hemostasis, therefore a 0 Prolene pursestring stitch was placed about the right groin access site. Hemostasis was achieved at  the right neck and a pressure dressing was applied. The patient tolerated the procedure well and was transferred to the ICU in good condition. IMPRESSION: 1. Inferior vena cavogram demonstrates indwelling inferior vena cava filter with possible adherent thrombus. 2. Technically successful inferior vena cava filter retrieval. 3. Successful bilateral pulmonary artery thrombectomy with removal of large volume acute appearing thrombus. 4. Technically successful inferior vena cava filter placement. Ester Sides, MD [facility] Radiology Electronically Signed   By: Ester Sides M.D.   On: 07/13/2024 08:49   IR Angiogram Selective Each Additional Vessel Result Date: 07/13/2024 INDICATION: [age]-year-old male with history of acute, high risk pulmonary embolism. EXAM: 1. Ultrasound-guided vascular access of the right common femoral vein. 2. Inferior vena cavogram. 3. Ultrasound-guided vascular access of the right internal jugular vein. 4. Inferior vena cava filter retrieval. 5. Pulmonary angiogram. 6. Central manometry. 7. Bilateral pulmonary artery aspiration thrombectomy. 8. Inferior vena cava filter placement. COMPARISON:  CTA chest from earlier the same day MEDICATIONS: 10,000 units heparin , intravenous ANESTHESIA/SEDATION: Moderate (conscious) sedation was employed during this procedure. A total of Versed  3 mg and Fentanyl  0 mcg was administered intravenously. Moderate Sedation Time: 97 minutes. The patient's level of consciousness and vital signs were monitored continuously by radiology nursing throughout the procedure under my direct supervision. FLUOROSCOPY TIME:  Nine hundred sixteen mGy reference air kerma COMPLICATIONS: None immediate. TECHNIQUE: Informed written  consent was obtained from the patient after a thorough discussion of the procedural risks, benefits and alternatives. All questions were addressed. Maximal Sterile Barrier Technique was utilized  including caps, mask, sterile gowns, sterile gloves, sterile drape, hand hygiene and skin antiseptic. A timeout was performed prior to the initiation of the procedure. Preprocedure ultrasound evaluation demonstrated patency of the right common femoral vein. The procedure was planned. Subdermal Local anesthesia was administered 1% lidocaine . A small skin nick was made. Under direct ultrasound visualization, the right common femoral vein was accessed with a 21 gauge micropuncture needle. A permanent ultrasound image was captured and stored in the record. Micropuncture sheath was inserted followed by placement of a Wholey wire was directed to the inferior vena cava under fluoroscopic guidance. An 8 [place] vascular sheath was placed. A pigtail catheter was inserted to the peripheral inferior vena cava and inferior vena cavagram was performed. Inferior vena cavagram was significant for presence of an infrarenal IVC filter with possible nonocclusive thrombus about the apex of the filter with sluggish antegrade flow. Given failure of anticoagulation and indwelling IVC filter with presence of thrombus, decision was made to remove the indwelling filter. Therefore, the right internal jugular vein was accessed with a 21 gauge micropuncture needle. A permanent ultrasound image was captured and stored in the record. A micropuncture sheath was introduced through which a Wholey wire was advanced to the peripheral inferior vena cava. Serial dilation was performed followed by introduction of an argon triple loop snare IVC filter retrieval coaxial kit. The snare was then used to capture the apex of the filter which was retrieved without complication. Repeat inferior vena cavagram was performed which demonstrated patency of the normal caliber inferior vena cava without evidence of filling defect or extravasation. Attention was then turned toward PE thrombectomy. In pre close fashion, 2 ProGlides at the 10 o'clock and 2 o'clock  positions. Over the wire, a 24 [place] Inari sheath was then placed and directed to the inferior vena cava. Over the wire, a double angle pigtail catheter was inserted and under fluoroscopic guidance was attempted to be directed through the right atrium and right ventricle to the main pulmonary artery, however due to the size of the right heart this was difficult. Therefore, a Swan-Ganz catheter was inserted with the balloon inflated which was floated to the main pulmonary artery. Central manometry was performed at this location with pulmonary arterial pressure of 70/38 with a mean of 45 mm Hg. The balloon was deflated and over a 0.025 guidewire the Swan-Ganz catheter was exchanged for a 4 French angled tip glide catheter. A Wholey wire was then inserted and directed into the left inferior pulmonary artery. The catheter was advanced to this location. The catheter was then exchanged for a 6 French angled tip select catheter. The wire was exchanged for a short taper superstiff Amplatz wire. The catheter was removed. A 24 [place] Flowtreiver aspiration catheter was then directed under fluoroscopic guidance to the main pulmonary artery. The inner dilator was removed. Pulmonary angiogram was then performed which demonstrated similar filling defects in the main and bilateral pulmonary arteries compatible with acute pulmonary embolism. The aspiration catheter was then advanced to the left pulmonary artery. Multiple aspirations were performed which yielded a large volume of acute appearing thrombus. Left pulmonary angiogram was then repeated which demonstrated significantly improved patency and perfusion of the left lung. There is a small volume additional thrombus in the proximal left inferior lobar pulmonary artery. Therefore, the T20 curved aspiration catheter was  advanced in coaxial fashion and aspiration was performed which yielded additional small volume acute appearing thrombus. The coaxial system was then directed to  the right pulmonary artery in the wire was inserted into the right inferior lobar pulmonary artery. Aspiration thrombectomy was then performed which yielded large volume acute appearing thrombus. The large volume thrombus was affixed to the distal tip of the aspiration catheter and required removal of the coaxial aspiration device over the wire and Ree insertion. At this point, wire access to the right pulmonary artery was lost. Given significant improvement in vital signs and large volume thrombus, the pulmonary artery was not selected for completion measurements. At this point, the patient's tachycardia improved and oxygen saturations improved. The catheters were removed. Repeat inferior vena cavagram was then performed for planning purposes which demonstrated patency and again no evidence of extravasation. Over the wire, an option elite IVC filter was placed in an infrarenal location under fluoroscopic visualization. The sheath was then removed and the ProGlides were tied and cut. There was insufficient hemostasis, therefore a 0 Prolene pursestring stitch was placed about the right groin access site. Hemostasis was achieved at the right neck and a pressure dressing was applied. The patient tolerated the procedure well and was transferred to the ICU in good condition. IMPRESSION: 1. Inferior vena cavogram demonstrates indwelling inferior vena cava filter with possible adherent thrombus. 2. Technically successful inferior vena cava filter retrieval. 3. Successful bilateral pulmonary artery thrombectomy with removal of large volume acute appearing thrombus. 4. Technically successful inferior vena cava filter placement. Ester Sides, MD [facility] Radiology Electronically Signed   By: Ester Sides M.D.   On: 07/13/2024 08:49   IR Angiogram Selective Each Additional Vessel Result Date: 07/13/2024 INDICATION: [age]-year-old male with history of acute, high risk  pulmonary embolism. EXAM: 1. Ultrasound-guided vascular access of the right common femoral vein. 2. Inferior vena cavogram. 3. Ultrasound-guided vascular access of the right internal jugular vein. 4. Inferior vena cava filter retrieval. 5. Pulmonary angiogram. 6. Central manometry. 7. Bilateral pulmonary artery aspiration thrombectomy. 8. Inferior vena cava filter placement. COMPARISON:  CTA chest from earlier the same day MEDICATIONS: 10,000 units heparin , intravenous ANESTHESIA/SEDATION: Moderate (conscious) sedation was employed during this procedure. A total of Versed  3 mg and Fentanyl  0 mcg was administered intravenously. Moderate Sedation Time: 97 minutes. The patient's level of consciousness and vital signs were monitored continuously by radiology nursing throughout the procedure under my direct supervision. FLUOROSCOPY TIME:  Nine hundred sixteen mGy reference air kerma COMPLICATIONS: None immediate. TECHNIQUE: Informed written consent was obtained from the patient after a thorough discussion of the procedural risks, benefits and alternatives. All questions were addressed. Maximal Sterile Barrier Technique was utilized including caps, mask, sterile gowns, sterile gloves, sterile drape, hand hygiene and skin antiseptic. A timeout was performed prior to the initiation of the procedure. Preprocedure ultrasound evaluation demonstrated patency of the right common femoral vein. The procedure was planned. Subdermal Local anesthesia was administered 1% lidocaine . A small skin nick was made. Under direct ultrasound visualization, the right common femoral vein was accessed with a 21 gauge micropuncture needle. A permanent ultrasound image was captured and stored in the record. Micropuncture sheath was inserted followed by placement of a Wholey wire was directed to the inferior vena cava under fluoroscopic guidance. An 8 [place] vascular sheath was placed. A pigtail catheter was inserted to the peripheral inferior vena  cava and inferior vena cavagram was performed. Inferior vena cavagram was significant for presence  of an infrarenal IVC filter with possible nonocclusive thrombus about the apex of the filter with sluggish antegrade flow. Given failure of anticoagulation and indwelling IVC filter with presence of thrombus, decision was made to remove the indwelling filter. Therefore, the right internal jugular vein was accessed with a 21 gauge micropuncture needle. A permanent ultrasound image was captured and stored in the record. A micropuncture sheath was introduced through which a Wholey wire was advanced to the peripheral inferior vena cava. Serial dilation was performed followed by introduction of an argon triple loop snare IVC filter retrieval coaxial kit. The snare was then used to capture the apex of the filter which was retrieved without complication. Repeat inferior vena cavagram was performed which demonstrated patency of the normal caliber inferior vena cava without evidence of filling defect or extravasation. Attention was then turned toward PE thrombectomy. In pre close fashion, 2 ProGlides at the 10 o'clock and 2 o'clock positions. Over the wire, a 24 [place] Inari sheath was then placed and directed to the inferior vena cava. Over the wire, a double angle pigtail catheter was inserted and under fluoroscopic guidance was attempted to be directed through the right atrium and right ventricle to the main pulmonary artery, however due to the size of the right heart this was difficult. Therefore, a Swan-Ganz catheter was inserted with the balloon inflated which was floated to the main pulmonary artery. Central manometry was performed at this location with pulmonary arterial pressure of 70/38 with a mean of 45 mm Hg. The balloon was deflated and over a 0.025 guidewire the Swan-Ganz catheter was exchanged for a 4 French angled tip glide catheter. A Wholey wire was then inserted and directed into the left inferior pulmonary  artery. The catheter was advanced to this location. The catheter was then exchanged for a 6 French angled tip select catheter. The wire was exchanged for a short taper superstiff Amplatz wire. The catheter was removed. A 24 [place] Flowtreiver aspiration catheter was then directed under fluoroscopic guidance to the main pulmonary artery. The inner dilator was removed. Pulmonary angiogram was then performed which demonstrated similar filling defects in the main and bilateral pulmonary arteries compatible with acute pulmonary embolism. The aspiration catheter was then advanced to the left pulmonary artery. Multiple aspirations were performed which yielded a large volume of acute appearing thrombus. Left pulmonary angiogram was then repeated which demonstrated significantly improved patency and perfusion of the left lung. There is a small volume additional thrombus in the proximal left inferior lobar pulmonary artery. Therefore, the T20 curved aspiration catheter was advanced in coaxial fashion and aspiration was performed which yielded additional small volume acute appearing thrombus. The coaxial system was then directed to the right pulmonary artery in the wire was inserted into the right inferior lobar pulmonary artery. Aspiration thrombectomy was then performed which yielded large volume acute appearing thrombus. The large volume thrombus was affixed to the distal tip of the aspiration catheter and required removal of the coaxial aspiration device over the wire and Ree insertion. At this point, wire access to the right pulmonary artery was lost. Given significant improvement in vital signs and large volume thrombus, the pulmonary artery was not selected for completion measurements. At this point, the patient's tachycardia improved and oxygen saturations improved. The catheters were removed. Repeat inferior vena cavagram was then performed for planning purposes which demonstrated patency and again no evidence of  extravasation. Over the wire, an option elite IVC filter was placed in an infrarenal location under fluoroscopic  visualization. The sheath was then removed and the ProGlides were tied and cut. There was insufficient hemostasis, therefore a 0 Prolene pursestring stitch was placed about the right groin access site. Hemostasis was achieved at the right neck and a pressure dressing was applied. The patient tolerated the procedure well and was transferred to the ICU in good condition. IMPRESSION: 1. Inferior vena cavogram demonstrates indwelling inferior vena cava filter with possible adherent thrombus. 2. Technically successful inferior vena cava filter retrieval. 3. Successful bilateral pulmonary artery thrombectomy with removal of large volume acute appearing thrombus. 4. Technically successful inferior vena cava filter placement. Ester Sides, MD [facility] Electronically Signed   By: Ester Sides M.D.   On: 07/13/2024 08:49   CT CERVICAL SPINE WO CONTRAST Result Date: 07/12/2024 CLINICAL DATA:  Blunt poly trauma. EXAM: CT CERVICAL SPINE WITHOUT CONTRAST TECHNIQUE: Multidetector CT imaging of the cervical spine was performed without intravenous contrast. Multiplanar CT image reconstructions were also generated. RADIATION DOSE REDUCTION: This exam was performed according to the departmental dose-optimization program which includes automated exposure control, adjustment of the mA and/or kV according to patient size and/or use of iterative reconstruction technique. COMPARISON:  None Available. FINDINGS: Alignment: Straightening of normal lordosis. No traumatic subluxation. Skull base and vertebrae: No acute fracture. Vertebral body heights are maintained. The dens and skull base are intact. Soft tissues and spinal canal: No prevertebral fluid or swelling. No visible canal hematoma. Disc levels: Anterior and posterior spurring extend from C4-C5 through a  C7-T1. Mild multilevel facet hypertrophy. Spinal canal narrowing at C4-C5 and C6-C7. Upper chest: Assessed on concurrent chest CT, reported separately. Other: None. IMPRESSION: Degenerative change in the cervical spine without acute fracture or subluxation. Electronically Signed   By: Andrea Gasman M.D.   On: 07/12/2024 18:48   CT Angio Chest/Abd/Pel for Dissection W and/or Wo Contrast Result Date: 07/12/2024 CLINICAL DATA:  Provided history: Acute aortic syndrome (AAS) suspected Found unresponsive on floor.  Hypotensive. EXAM: CT ANGIOGRAPHY CHEST, ABDOMEN AND PELVIS TECHNIQUE: Non-contrast CT of the chest was initially obtained. Multidetector CT imaging through the chest, abdomen and pelvis was performed using the standard protocol during bolus administration of intravenous contrast. Multiplanar reconstructed images and MIPs were obtained and reviewed to evaluate the vascular anatomy. RADIATION DOSE REDUCTION: This exam was performed according to the departmental dose-optimization program which includes automated exposure control, adjustment of the mA and/or kV according to patient size and/or use of iterative reconstruction technique. CONTRAST:  OMNIPAQUE  IOHEXOL  350 MG/ML SOLN COMPARISON:  Chest CTA 07/07/2024, abdominopelvic venogram 05/21/2021 FINDINGS: CTA CHEST FINDINGS Cardiovascular: No aortic hematoma on noncontrast exam. No dissection or evidence of acute aortic injury. Mild aortic atherosclerosis. Acute bilateral pulmonary emboli that are new from prior exam. Small saddle embolus is new, with large clot burden in the distal main pulmonary arteries extending into many lobar and segmental branches. 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 There is right heart strain with RV to LV ratio of 2. The heart is upper normal in size. There is no pericardial effusion. Mediastinum/Nodes: No mediastinal hemorrhage or hematoma.  Small mediastinal lymph nodes are not enlarged by size criteria. Decompressed esophagus. No pneumomediastinum. No visible thyroid  nodule. Lungs/Pleura: No pneumothorax or pulmonary contusion. Mild heterogeneous pulmonary parenchyma. Scattered areas of scarring and subpleural reticulation, unchanged from recent exam. No evidence of pulmonary infarct or focal airspace disease. No pleural fluid. Trachea and central airways are  clear. Musculoskeletal: No acute fracture of the ribs, sternum, included clavicles or shoulder girdles. Diffuse degenerative change in the thoracic spine without thoracic spine fracture. Partial fusion of the posterior elements of T12-L1. No confluent body wall contusion. Review of the MIP images confirms the above findings. CTA ABDOMEN AND PELVIS FINDINGS VASCULAR Aorta: No aortic aneurysm or dissection. There is periaortic and retroperitoneal stranding at the L2 level, also at the level of IVC filter, but no discrete aortic injury. No aortic irregularity, aortic contours are smooth. Mild aortic atherosclerosis. Celiac: Patent without evidence of aneurysm, dissection, vasculitis or significant stenosis. SMA: Patent without evidence of aneurysm, dissection, vasculitis or significant stenosis. Renals: Both renal arteries are patent without evidence of aneurysm, dissection, vasculitis, fibromuscular dysplasia or significant stenosis. IMA: Patent without evidence of aneurysm, dissection, vasculitis or significant stenosis. Inflow: Patent without evidence of aneurysm, dissection, vasculitis or significant stenosis. Veins: The venous structures are not well assessed on this arterial phase exam. IVC filter in place. There is retroperitoneal stranding and edema at the level of the filter. Review of the MIP images confirms the above findings. NON-VASCULAR Hepatobiliary: Arterial phase imaging limits detailed assessment. The liver is enlarged with steatosis. There is no evidence of perihepatic hematoma or  focal liver abnormality. No evidence of liver injury. Unremarkable gallbladder. Pancreas: No evidence of pancreatic injury. No ductal dilatation or inflammation. Spleen: No perisplenic hematoma.  Normal arterial phase enhancement. Adrenals/Urinary Tract: No adrenal hemorrhage. No evidence of renal injury. No hydronephrosis. Nondistended urinary bladder. Stomach/Bowel: No evidence of bowel injury or inflammation. No bowel wall thickening. Small to moderate colonic stool burden. Occasional colonic diverticula without diverticulitis. Lymphatic: No adenopathy. Reproductive: Prostate is unremarkable. Other: Retroperitoneal stranding adjacent to the aorta and IVC at the L2 level at the level of IVC filter. There is no other free fluid. No free air. Moderate fat containing umbilical hernia. No confluent body wall contusion. Musculoskeletal: No acute fracture of the pelvis or lumbar spine. Diffuse lumbar degenerative change. Review of the MIP images confirms the above findings. IMPRESSION: 1. Acute bilateral pulmonary emboli with large clot burden and right heart strain, (RV/LV Ratio = 2) consistent with at least submassive (intermediate risk) PE. The presence of right heart strain has been associated with an increased risk of morbidity and mortality. Please refer to the Code PE Focused order set in EPIC. 2. Retroperitoneal stranding adjacent to the abdominal aorta and IVC, at the level of IVC filter. There is no evidence of discrete abdominal aortic injury, however stranding etiology is indeterminate in etiology. The IVC is not well assessed on this venous phase exam. Consider a follow-up exam including venous phase imaging. 3. IVC filter in place. 4. No other evidence of acute traumatic injury to the chest, abdomen, or pelvis. 5. Hepatomegaly and hepatic steatosis. Aortic Atherosclerosis (ICD10-I70.0). Critical Value/emergent results were called by telephone at the time of interpretation on 07/12/2024 at 6:27 pm to Dr  Lyndel, who verbally acknowledged these results. Electronically Signed   By: Andrea Gasman M.D.   On: 07/12/2024 18:46   CT HEAD WO CONTRAST Result Date: 07/12/2024 CLINICAL DATA:  Head trauma, found on floor with hematoma to back of head. Hypotensive. EXAM: CT HEAD WITHOUT CONTRAST TECHNIQUE: Contiguous axial images were obtained from the base of the skull through the vertex without intravenous contrast. RADIATION DOSE REDUCTION: This exam was performed according to the departmental dose-optimization program which includes automated exposure control, adjustment of the mA and/or kV according to patient size and/or use of iterative  reconstruction technique. COMPARISON:  None Available. FINDINGS: Brain: No intracranial hemorrhage, mass effect, or midline shift. No hydrocephalus. The basilar cisterns are patent. No evidence of territorial infarct or acute ischemia. No extra-axial or intracranial fluid collection. Vascular: Atherosclerosis of skullbase vasculature without hyperdense vessel or abnormal calcification. Skull: No fracture or focal lesion. Sinuses/Orbits: No acute fracture or acute finding. Other: Mild right parietal scalp contusion. IMPRESSION: Mild right parietal scalp contusion. No acute intracranial abnormality. No skull fracture. Electronically Signed   By: Andrea Gasman M.D.   On: 07/12/2024 18:34   CT Angio Chest Pulmonary Embolism (PE) W or WO Contrast Result Date: 07/07/2024 CLINICAL DATA:  shortness of breath with hx of PE EXAM: CT ANGIOGRAPHY CHEST WITH CONTRAST TECHNIQUE: Multidetector CT imaging of the chest was performed using the standard protocol during bolus administration of intravenous contrast. Multiplanar CT image reconstructions and MIPs were obtained to evaluate the vascular anatomy. RADIATION DOSE REDUCTION: This exam was performed according to the departmental dose-optimization program which includes automated exposure control, adjustment of the mA and/or kV  according to patient size and/or use of iterative reconstruction technique. CONTRAST:  75mL OMNIPAQUE  IOHEXOL  350 MG/ML SOLN COMPARISON:  May 20, 2023, May 24, 2021 FINDINGS: Pulmonary Embolism: No acute pulmonary embolism. Web-like filling defect within the proximal segmental right middle lobe branch (axial 77) and within the left lower lobe pulmonary artery (axial 72), consistent with chronic pulmonary emboli changes. Cardiovascular: No cardiomegaly or pericardial effusion.Densely calcified LAD atherosclerosis.No aortic aneurysm. Scattered calcified aortic atherosclerosis. Mediastinum/Nodes: No mediastinal mass. No mediastinal, hilar, or axillary lymphadenopathy. Lungs/Pleura: The midline trachea and bronchi are patent. Posterior bibasilar dependent atelectasis. No focal airspace consolidation, pleural effusion, or pneumothorax. Mild pleuroparenchymal scarring in both lung apices. Minimal subpleural reticulation within the anterior aspects of both upper lobes, possibly postinfectious fibrosis in scarring. Musculoskeletal: No acute fracture or destructive bone lesion. Multilevel degenerative disc disease of the spine. Upper Abdomen: No acute abnormality in the partially visualized upper abdomen. Geographic hepatic steatosis. Review of the MIP images confirms the above findings. IMPRESSION: 1. No acute intrathoracic abnormality; specifically, no pulmonary embolism, pneumonia, or pleural effusion. 2. 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 3. Extensive, dense calcified atherosclerosis throughout the LAD. Aortic Atherosclerosis (ICD10-I70.0). Electronically Signed   By: Rogelia Myers M.D.   On: 07/07/2024 13:18     Subjective: No complaints  Discharge Exam: Vitals:   07/21/24 0416 07/21/24 0706  BP: 102/67 104/68  Pulse: 85 77  Resp: 18   Temp: 98.1 F (36.7 C) 98 F (36.7 C)  SpO2: 96% 98%   Vitals:   07/20/24 2019 07/21/24 0416 07/21/24 0700 07/21/24 0706  BP:  105/63 102/67  104/68  Pulse: 84 85  77  Resp: 18 18    Temp: 98.9 F (37.2 C) 98.1 F (36.7 C)  98 F (36.7 C)  TempSrc:    Oral  SpO2: 97% 96%  98%  Weight:   118.1 kg   Height:        General: Pt is alert, awake, not in acute distress Cardiovascular: RRR, S1/S2 +, no rubs, no gallops Respiratory: CTA bilaterally, no wheezing, no rhonchi Abdominal: Soft, NT, ND, bowel sounds + Extremities: no edema, no cyanosis    The results of significant diagnostics from this hospitalization (including imaging, microbiology, ancillary and laboratory) are listed below for reference.     Microbiology: Recent Results (from the past 240 hours)  MRSA Next Gen by PCR, Nasal  Status: None   Collection Time: 07/12/24 11:55 PM   Specimen: Nasal Mucosa; Nasal Swab  Result Value Ref Range Status   MRSA by PCR Next Gen NOT DETECTED NOT DETECTED Final    Comment: (NOTE) The GeneXpert MRSA Assay (FDA approved for NASAL specimens only), is one component of a comprehensive MRSA colonization surveillance program. It is not intended to diagnose MRSA infection nor to guide or monitor treatment for MRSA infections. Test performance is not FDA approved in patients less than 32 years old. Performed at [facility], [address], [place], [place] [address]      Labs: BNP (last 3 results) Recent Labs    07/07/24 1029 07/12/24 1858  BNP 24.3 12.0   Basic Metabolic Panel: Recent Labs  Lab 07/17/24 0742 07/18/24 0410 07/19/24 0459 07/20/24 0459 07/21/24 0413  NA 134* 134* 134* 133* 136  K 4.4 4.2 4.0 3.8 4.0  CL 106 104 105 102 104  CO2 20* 22 22 22 25   GLUCOSE 156* 118* 136* 144* 126*  BUN 14 18 18 17 18   CREATININE 1.06 1.18 1.02 1.03 1.08  CALCIUM  8.1* 7.9* 7.8* 7.9* 7.9*  MG 2.0 1.8 2.0 2.0 2.2   Liver Function Tests: Recent Labs  Lab 07/14/24 1012  AST 75*  ALT 194*  ALKPHOS 68  BILITOT 0.5  PROT 6.1*  ALBUMIN 3.1*   No results for input(s): LIPASE, AMYLASE  in the last 168 hours. No results for input(s): AMMONIA in the last 168 hours. CBC: Recent Labs  Lab 07/17/24 0742 07/18/24 0410 07/19/24 0459 07/20/24 0459 07/21/24 0413  WBC 3.5* 4.3 4.2 5.1 4.3  NEUTROABS 1.8 2.5 2.3 3.0 2.7  HGB 9.9* 9.8* 9.6* 9.7* 9.0*  HCT 31.1* 29.7* 30.0* 30.4* 27.7*  MCV 99.4 96.1 97.1 98.1 96.9  PLT 80* 78* 82* 80* 82*   Cardiac Enzymes: No results for input(s): CKTOTAL, CKMB, CKMBINDEX, TROPONINI in the last 168 hours. BNP: Invalid input(s): POCBNP CBG: Recent Labs  Lab 07/20/24 0843 07/20/24 1117 07/20/24 1626 07/20/24 2015 07/21/24 0700  GLUCAP 185* 204* 211* 216* 133*   D-Dimer No results for input(s): DDIMER in the last 72 hours. Hgb A1c No results for input(s): HGBA1C in the last 72 hours. Lipid Profile No results for input(s): CHOL, HDL, LDLCALC, TRIG, CHOLHDL, LDLDIRECT in the last 72 hours. Thyroid  function studies No results for input(s): TSH, T4TOTAL, T3FREE, THYROIDAB in the last 72 hours.  Invalid input(s): FREET3 Anemia work up No results for input(s): VITAMINB12, FOLATE, FERRITIN, TIBC, IRON, RETICCTPCT in the last 72 hours. Urinalysis    Component Value Date/Time   COLORURINE YELLOW 07/13/2024 0345   APPEARANCEUR HAZY (A) 07/13/2024 0345   LABSPEC 1.031 (H) 07/13/2024 0345   PHURINE 5.0 07/13/2024 0345   GLUCOSEU >=500 (A) 07/13/2024 0345   HGBUR SMALL (A) 07/13/2024 0345   BILIRUBINUR NEGATIVE 07/13/2024 0345   KETONESUR NEGATIVE 07/13/2024 0345   PROTEINUR 30 (A) 07/13/2024 0345   UROBILINOGEN 0.2 05/10/2014 1423   NITRITE NEGATIVE 07/13/2024 0345   LEUKOCYTESUR NEGATIVE 07/13/2024 0345   Sepsis Labs Recent Labs  Lab 07/18/24 0410 07/19/24 0459 07/20/24 0459 07/21/24 0413  WBC 4.3 4.2 5.1 4.3   Microbiology Recent Results (from the past 240 hours)  MRSA Next Gen by PCR, Nasal     Status: None   Collection Time: 07/12/24 11:55 PM   Specimen: Nasal  Mucosa; Nasal Swab  Result Value Ref Range Status   MRSA by PCR Next Gen NOT DETECTED NOT  DETECTED Final    Comment: (NOTE) The GeneXpert MRSA Assay (FDA approved for NASAL specimens only), is one component of a comprehensive MRSA colonization surveillance program. It is not intended to diagnose MRSA infection nor to guide or monitor treatment for MRSA infections. Test performance is not FDA approved in patients less than 93 years old. Performed at [facility], [address], [place], [place] [address]      Time coordinating discharge: Over 35 minutes  SIGNED:   Erle Odell Castor, MD  [facility] 07/21/2024, 8:08 AM Pager   If 7PM-7AM, please contact night-coverage [email or web address] Password [email or web address]

## 2024-07-21 NOTE — Plan of Care (Signed)
  Problem: Pain Managment: Goal: General experience of comfort will improve and/or be controlled Outcome: Progressing  Interventions Assess/Monitor for acute and/or chronic pain Implement pain control measures Explore nonpharmacological interventions for pain (relaxation techniques, music, guided imagery)

## 2024-07-21 NOTE — Progress Notes (Signed)
 ANTICOAGULATION CONSULT NOTE  Pharmacy Consult for Lovenox  + warfarin Indication: pulmonary embolus  Patient Measurements: Height: 5' 6 (167.6 cm) Weight: 118.1 kg (260 lb 5.8 oz) IBW/kg (Calculated) : 63.8 Heparin  Dosing Weight: 90 kg  Vital Signs: Temp: 98 F (36.7 C) (08/14 0706) Temp Source: Oral (08/14 0706) BP: 104/68 (08/14 0706) Pulse Rate: 77 (08/14 0706)  Labs: Recent Labs    07/19/24 0459 07/20/24 0459 07/21/24 0413  HGB 9.6* 9.7* 9.0*  HCT 30.0* 30.4* 27.7*  PLT 82* 80* 82*  LABPROT 23.4* 25.3* 37.9*  INR 2.0* 2.2* 3.7*  CREATININE 1.02 1.03 1.08    Estimated Creatinine Clearance: 91.3 mL/min (by C-G formula based on SCr of 1.08 mg/dL).   Assessment: 74 yom with a history of DM, PE on warfarin and IVC in place, chronic DVT. Patient is presenting with fell and hypotension. Heparin  per pharmacy consult placed for pulmonary embolus. CTA w/ acute bilateral pulmonary emboli with large clot burden and right heart strain c/w submassive. Patient s/p IVC filter retrieval/replacement and thrombectomy on 07/12/24 PM.   PTA warfarin regimen: 4 mg daily  INR up to 3.7 today (up from 2.2(  Goal of Therapy:  Anti-Xa level 0.6-1.0 4 hours after enoxaparin  dose INR Goal: 3.0-3.5 (for recurrent pulmonary embolisms) Monitor platelets by anticoagulation protocol: Yes   Plan:  DC Lovenox  Hold warfarin today Daily INR, H&H, platelets, and s/sx of bleeding  Thank you Olam Monte, PharmD Please utilize Amion for appropriate phone number to reach the unit pharmacist [facility])  07/21/2024, 7:52 AM

## 2024-07-21 NOTE — TOC Transition Note (Addendum)
 Transition of Care [facility]) - Discharge Note   Patient Details  Name: Travis Rollins MRN: [id number] Date of Birth: 1966-05-01  Transition of [facility]) CM/SW Contact:  Tom-Johnson, Harryette Shuart Daphne, RN Phone Number: 07/21/2024, 9:15 AM   Clinical Narrative:     Patient is scheduled for discharge today.  Readmission Risk Assessment done. Home health info, outpatient f/u, hospital f/u and discharge instructions on AVS. Sister, Lauren to transport at discharge.  No further TOC needs noted.      Final next level of care: Home w Home Health Services Barriers to Discharge: Barriers Resolved   Patient Goals and CMS Choice Patient states their goals for this hospitalization and ongoing recovery are:: To discharge home CMS Medicare.gov Compare Post Acute Care list provided to:: Patient Choice offered to / list presented to : Parent      Discharge Placement                Patient to be transferred to facility by: [facility] Additional resources added to the After Visit Summary for   In-house Referral: NA Discharge Planning Services: CM Consult Post Acute Care Choice: NA          DME Arranged: Bedside commode DME Agency: [employer] Date DME Agency Contacted: 07/19/24 Time DME Agency Contacted: [phone] Representative spoke with at DME Agency: [facility] HH Arranged: PT, OT, Nurse's Aide HH Agency: [facility] Date [facility] Agency Contacted: 07/19/24 Time HH Agency Contacted: [phone]    Social Drivers of Health (SDOH) Interventions SDOH Screenings   Food Insecurity: No Food Insecurity (07/15/2024)  Housing: Low Risk  (07/15/2024)  Transportation Needs: No Transportation Needs (07/15/2024)  Utilities: Not At Risk (07/15/2024)  Social Connections: Unknown (06/30/2023)   Received from [facility]  Tobacco Use: Low Risk  (07/15/2024)     Readmission Risk Interventions    07/19/2024    1:44 PM  Readmission Risk Prevention Plan  Transportation  Screening Complete  PCP or Specialist Appt within 5-7 Days Complete  Home Care Screening Complete  Medication Review (RN CM) Referral to Pharmacy

## 2024-07-21 NOTE — Progress Notes (Signed)
 DISCHARGE NOTE HOME Travis Rollins to be discharged Home per MD order. Discussed prescriptions and follow up appointments with the patient. Prescriptions given to patient; medication list explained in detail. Patient verbalized understanding.  Skin clean, dry and intact without evidence of skin break down, no evidence of skin tears noted. IV catheter discontinued intact. Site without signs and symptoms of complications. Dressing and pressure applied. Pt denies pain at the site currently. No complaints noted.  Patient free of lines, drains, and wounds.   An After Visit Summary (AVS) was printed and given to the patient. Patient escorted via wheelchair, and discharged home via private auto.  [name], RN

## 2024-07-26 ENCOUNTER — Encounter (HOSPITAL_COMMUNITY)

## 2024-08-03 ENCOUNTER — Encounter: Payer: Self-pay | Admitting: Medical Oncology

## 2024-08-03 ENCOUNTER — Inpatient Hospital Stay: Admitting: Medical Oncology

## 2024-08-03 ENCOUNTER — Inpatient Hospital Stay: Attending: Medical Oncology

## 2024-08-03 VITALS — BP 119/64 | HR 92 | Temp 99.0°F | Resp 18 | Ht 66.25 in | Wt 244.0 lb

## 2024-08-03 DIAGNOSIS — D72819 Decreased white blood cell count, unspecified: Secondary | ICD-10-CM

## 2024-08-03 DIAGNOSIS — D649 Anemia, unspecified: Secondary | ICD-10-CM

## 2024-08-03 DIAGNOSIS — Z7901 Long term (current) use of anticoagulants: Secondary | ICD-10-CM

## 2024-08-03 DIAGNOSIS — D696 Thrombocytopenia, unspecified: Secondary | ICD-10-CM

## 2024-08-03 DIAGNOSIS — I2699 Other pulmonary embolism without acute cor pulmonale: Secondary | ICD-10-CM

## 2024-08-03 DIAGNOSIS — D539 Nutritional anemia, unspecified: Secondary | ICD-10-CM

## 2024-08-03 DIAGNOSIS — N179 Acute kidney failure, unspecified: Secondary | ICD-10-CM

## 2024-08-03 DIAGNOSIS — Z86711 Personal history of pulmonary embolism: Secondary | ICD-10-CM

## 2024-08-03 LAB — FOLATE: Folate: 16.9 ng/mL (ref >5.9)

## 2024-08-03 LAB — HEPATITIS B SURFACE ANTIBODY,QUALITATIVE: Hep B S Ab: NONREACTIVE

## 2024-08-03 LAB — CBC WITH DIFFERENTIAL (CANCER CENTER ONLY)
Abs Immature Granulocytes: 0.03 K/uL (ref 0.00–0.07)
Basophils Absolute: 0 K/uL (ref 0.0–0.1)
Basophils Relative: 0 %
Eosinophils Absolute: 0.2 K/uL (ref 0.0–0.5)
Eosinophils Relative: 4 %
HCT: 39.2 % (ref 39.0–52.0)
Hemoglobin: 12.2 g/dL — ABNORMAL LOW (ref 13.0–17.0)
Immature Granulocytes: 1 %
Lymphocytes Relative: 28 %
Lymphs Abs: 1 K/uL (ref 0.7–4.0)
MCH: 30.8 pg (ref 26.0–34.0)
MCHC: 31.1 g/dL (ref 30.0–36.0)
MCV: 99 fL (ref 80.0–100.0)
Monocytes Absolute: 0.3 K/uL (ref 0.1–1.0)
Monocytes Relative: 8 %
Neutro Abs: 2.2 K/uL (ref 1.7–7.7)
Neutrophils Relative %: 59 %
Platelet Count: 159 K/uL (ref 150–400)
RBC: 3.96 MIL/uL — ABNORMAL LOW (ref 4.22–5.81)
RDW: 17.3 % — ABNORMAL HIGH (ref 11.5–15.5)
WBC Count: 3.7 K/uL — ABNORMAL LOW (ref 4.0–10.5)
nRBC: 0 % (ref 0.0–0.2)

## 2024-08-03 LAB — IRON AND IRON BINDING CAPACITY (CC-WL,HP ONLY)
Iron: 114 ug/dL (ref 45–182)
Saturation Ratios: 35 % (ref 17.9–39.5)
TIBC: 322 ug/dL (ref 250–450)
UIBC: 208 ug/dL

## 2024-08-03 LAB — CMP (CANCER CENTER ONLY)
ALT: 63 U/L — ABNORMAL HIGH (ref 0–44)
AST: 41 U/L (ref 15–41)
Albumin: 3.5 g/dL (ref 3.5–5.0)
Alkaline Phosphatase: 97 U/L (ref 38–126)
Anion gap: 13 (ref 5–15)
BUN: 29 mg/dL — ABNORMAL HIGH (ref 6–20)
CO2: 20 mmol/L — ABNORMAL LOW (ref 22–32)
Calcium: 9 mg/dL (ref 8.9–10.3)
Chloride: 105 mmol/L (ref 98–111)
Creatinine: 1.06 mg/dL (ref 0.61–1.24)
GFR, Estimated: >60 mL/min (ref >60)
Glucose, Bld: 143 mg/dL — ABNORMAL HIGH (ref 70–99)
Potassium: 4.3 mmol/L (ref 3.5–5.1)
Sodium: 138 mmol/L (ref 135–145)
Total Bilirubin: 0.6 mg/dL (ref 0.0–1.2)
Total Protein: 6.8 g/dL (ref 6.5–8.1)

## 2024-08-03 LAB — HEPATITIS B SURFACE ANTIGEN: Hepatitis B Surface Ag: NONREACTIVE

## 2024-08-03 LAB — HEPATITIS C ANTIBODY: HCV Ab: NONREACTIVE

## 2024-08-03 LAB — LACTATE DEHYDROGENASE: LDH: 327 U/L — ABNORMAL HIGH (ref 98–192)

## 2024-08-03 LAB — FERRITIN: Ferritin: 266 ng/mL (ref 24–336)

## 2024-08-03 LAB — VITAMIN B12: Vitamin B-12: 632 pg/mL (ref 180–914)

## 2024-08-03 LAB — SAVE SMEAR(SSMR), FOR PROVIDER SLIDE REVIEW

## 2024-08-03 NOTE — Progress Notes (Signed)
 [facility] [phone]   [phone]  INITIAL CONSULT NOTE  Patient Care Team: [name], NP as PCP - General  CHIEF COMPLAINTS/PURPOSE OF CONSULTATION:  Thrombocytopenia.   HISTORY OF PRESENTING ILLNESS:  Travis Rollins 58 y.o. male is referred to our office following their recent hospital stay for thrombocytopenia, anemia. He was last seen by our office on 08/05/2021 by Dr. Sherrod for leukocytopenia and thrombocytopenia. He has a history of lupus anticoagulant. At the time of his last referral his lab abnormalities were suspected to be secondary to his allopurinol and lipitor. At the time of his last visit his TSH was 1.099, RF <10, HIV negative, acute hepatitis panel negative, ANA was negative, B12 633, platelets 99, Hgb 14.5, WBC 3.5, creatinine of 1.33 and normal LFTs, LDH of 195, ferritin 173, iron saturation of 30%. It was discussed that if lab abnormalities did not improve a BMB would be suggested.   He has a history of PE, Anemia and low platelets. He also has a history of Transaminitis. He originally presented to the ER on 07/07/2024 for acute SOB. PE study negative. Then was taken to the ER after a syncopal episode where he was found to have a saddle PE with heart strain. INR at that time was 1.3 and platelets were 67. He was started on IV heparin  and admitted to the ICU. He had a mechanical thrombectomy and bilateral clot retreval IVC filter removed and replaced on 07/12/2024. Hematology was consulted and he was switched to Lovenox  and Coumadin . Target INR of 3-3.5 recommended by Hematology. He was discharged with an INR of 3.7. He had follow up with the coumadin  clinic on 07/25/2024.   He has a history of PE in 2015, 2021 and three others per patient as well as chronic lower DVT- was on coumadin  however it appears from records that he was not having his INR checked so it is unclear if he was in target range or not. He has trailed and failed  multiple blood thinners.   Today he reports that he has been slowly improving. SOB slowly improved. No chest pains. Chronic peripheral edema which is stable.   There has been no bleeding to her knowledge: denies epistaxis, gingivitis, hemoptysis, hematemesis, hematuria, melena, excessive bruising, blood donation.   His last INR check was on Monday with an INR value of 3. He is going to the [employer]. He is taking it as directed.   ETOH use- No  Liver disease- Yes- abdominal US  on 07/14/2024 showed coarsening of hepatitc echo texture.  No tylenol  use Last colonoscopy- 6 years ago in May- he is scheduled for an updated one in November.    MEDICAL HISTORY:  Past Medical History:  Diagnosis Date   Anemia    Carpal tunnel syndrome    Deep vein thrombosis (HCC) 07/2013, 10/2014, 03/16/2015   LLE (studies at [facility])   Depression    Diabetes mellitus    Hyperlipidemia    Hypertension    Obesity    Osteoarthritis    Peripheral vascular disease (HCC)    6 yrs ago, DVT in Lt knee/ groin   Pulmonary embolism (HCC)    hx of x 3 per patient; most recent 02/2014   Sinusitis    Sleep apnea     SURGICAL HISTORY: Past Surgical History:  Procedure Laterality Date   CARDIAC CATHETERIZATION N/A 08/10/2015   Procedure: Left Heart Cath and Coronary Angiography;  Surgeon: Gordy Bergamo, MD;  Location:  [facility];  Service: Cardiovascular;  Laterality: N/A;   CYSTOSCOPY WITH RETROGRADE PYELOGRAM, URETEROSCOPY AND STENT PLACEMENT Bilateral 01/03/2015   Procedure: CYSTOSCOPY WITH RETROGRADE PYELOGRAM, RIGHT URETEROSCOPY AND RIGHT STENT PLACEMENT;  Surgeon: Ricardo Likens, MD;  Location: [facility];  Service: Urology;  Laterality: Bilateral;   CYSTOSCOPY WITH RETROGRADE PYELOGRAM, URETEROSCOPY AND STENT PLACEMENT Right 03/12/2016   Procedure: CYSTOSCOPY WITH RETROGRADE PYELOGRAM, URETEROSCOPY AND STENT PLACEMENT;  Surgeon: Ricardo Likens, MD;  Location: [facility];  Service: Urology;   Laterality: Right;   ENDOVENOUS ABLATION SAPHENOUS VEIN W/ LASER Left 04/20/2018   endovenous laser ablation left greater saphenous vein by Lynwood Collum MD    HOLMIUM LASER APPLICATION Right 01/03/2015   Procedure: HOLMIUM LASER APPLICATION;  Surgeon: Ricardo Likens, MD;  Location: [facility];  Service: Urology;  Laterality: Right;   HOLMIUM LASER APPLICATION Right 03/12/2016   Procedure: HOLMIUM LASER APPLICATION;  Surgeon: Ricardo Likens, MD;  Location: [facility];  Service: Urology;  Laterality: Right;   IR ANGIOGRAM PULMONARY BILATERAL SELECTIVE  07/12/2024   IR ANGIOGRAM SELECTIVE EACH ADDITIONAL VESSEL  07/12/2024   IR ANGIOGRAM SELECTIVE EACH ADDITIONAL VESSEL  07/12/2024   IR IVC FILTER PLMT / S&I /IMG GUID/MOD SED  07/12/2024   IR IVC FILTER RETRIEVAL / S&I PORTER GUID/MOD SED  07/12/2024   IR THROMBECT PRIM MECH ADD (INCLU) MOD SED  07/12/2024   IR THROMBECT PRIM MECH ADD (INCLU) MOD SED  07/12/2024   IR THROMBECT PRIM MECH INIT (INCLU) MOD SED  07/12/2024   IR US  GUIDE VASC ACCESS RIGHT  07/12/2024   IR US  GUIDE VASC ACCESS RIGHT  07/12/2024   IR VENOCAVAGRAM IVC  07/12/2024   IVC filter   07/2014    TONSILLECTOMY     tubes removd from ears      SOCIAL HISTORY: Social History   Socioeconomic History   Marital status: Single    Spouse name: Not on file   Number of children: 0   Years of education: Not on file   Highest education level: Not on file  Occupational History   Not on file  Tobacco Use   Smoking status: Never   Smokeless tobacco: Never  Vaping Use   Vaping status: Never Used  Substance and Sexual Activity   Alcohol use: Never   Drug use: No   Sexual activity: Yes    Birth control/protection: Condom  Other Topics Concern   Not on file  Social History Narrative   Not on file   Social Drivers of Health   Financial Resource Strain: Not on file  Food Insecurity: No Food Insecurity (08/03/2024)   Hunger Vital Sign    Worried About Running Out of Food in the Last Year: Never true     Ran Out of Food in the Last Year: Never true  Transportation Needs: No Transportation Needs (07/15/2024)   PRAPARE - Administrator, Civil Service (Medical): No    Lack of Transportation (Non-Medical): No  Physical Activity: Not on file  Stress: Not on file  Social Connections: Unknown (06/30/2023)   Received from [facility]   Social Network    Social Network: Not on file  Intimate Partner Violence: Not At Risk (08/03/2024)   Humiliation, Afraid, Rape, and Kick questionnaire    Fear of Current or Ex-Partner: No    Emotionally Abused: No    Physically Abused: No    Sexually Abused: No    FAMILY HISTORY: Family History  Problem Relation Age of  Onset   Kidney disease Father    Diabetes Father    Colon cancer Neg Hx    Esophageal cancer Neg Hx    Rectal cancer Neg Hx    Stomach cancer Neg Hx     ALLERGIES:  is allergic to penicillins.  MEDICATIONS:  Current Outpatient Medications  Medication Sig Dispense Refill   allopurinol (ZYLOPRIM) 100 MG tablet Take 100 mg by mouth daily.     atorvastatin  (LIPITOR) 80 MG tablet Take 80 mg by mouth daily.     Capsicum, Cayenne, 455 MG CAPS Take 2 tablets by mouth in the morning and at bedtime.     clindamycin (CLEOCIN) 300 MG capsule Take 300 mg by mouth 2 (two) times daily.     co-enzyme Q-10 30 MG capsule Take 30 mg by mouth daily.     ezetimibe  (ZETIA ) 10 MG tablet Take 10 mg by mouth daily.     Febuxostat 80 MG TABS Take 1 tablet by mouth daily.     furosemide  (LASIX ) 40 MG tablet Take 40 mg by mouth every morning.      L-ARGININE PO Take 1 tablet by mouth daily.     lisinopril  (PRINIVIL ,ZESTRIL ) 2.5 MG tablet Take 2.5 mg by mouth daily.     Magnesium 200 MG CHEW Chew 2 capsules by mouth in the morning and at bedtime.     metoprolol  succinate (TOPROL -XL) 50 MG 24 hr tablet Take 50 mg by mouth at bedtime. Take with or immediately following a meal.     omega-3 acid ethyl esters (LOVAZA) 1 g capsule Take 1 g by mouth daily.      potassium chloride  (K-DUR) 10 MEQ tablet Take 10 mEq by mouth at bedtime.      SYNJARDY XR 12.04-999 MG TB24 Take 1 tablet by mouth 2 (two) times daily.     tamsulosin  (FLOMAX ) 0.4 MG CAPS capsule Take 0.4 mg by mouth daily after breakfast.     topiramate  (TOPAMAX ) 50 MG tablet Take 50 mg by mouth 2 (two) times daily.     TRESIBA FLEXTOUCH 200 UNIT/ML SOPN Inject 26 Units into the skin at bedtime.  3   TRULICITY 3 MG/0.5ML SOPN Inject 3 mg into the skin once a week.     warfarin (COUMADIN ) 4 MG tablet Take 2 tablets (8 mg total) by mouth daily. 60 tablet 1   No current facility-administered medications for this visit.    REVIEW OF SYSTEMS:   Constitutional: ( - ) fevers, ( - )  chills , ( - ) night sweats Eyes: ( - ) blurriness of vision, ( - ) double vision, ( - ) watery eyes Ears, nose, mouth, throat, and face: ( - ) mucositis, ( - ) sore throat Respiratory: ( - ) cough, ( - ) dyspnea, ( - ) wheezes Cardiovascular: ( - ) palpitation, ( - ) chest discomfort, ( - ) lower extremity swelling Gastrointestinal:  ( - ) nausea, ( - ) heartburn, ( - ) change in bowel habits Skin: ( - ) abnormal skin rashes Lymphatics: ( - ) new lymphadenopathy, ( - ) easy bruising Neurological: ( - ) numbness, ( - ) tingling, ( - ) new weaknesses Behavioral/Psych: ( - ) mood change, ( - ) new changes  All other systems were reviewed with the patient and are negative.  PHYSICAL EXAMINATION: ECOG PERFORMANCE STATUS: 2 - Symptomatic, <50% confined to bed  Vitals:   08/03/24 1310  BP: 119/64  Pulse: 92  Resp: 18  Temp: 99 F (37.2 C)  SpO2: 100%   Filed Weights   08/03/24 1310  Weight: 244 lb 0.6 oz (110.7 kg)    GENERAL: well appearing obese males in NAD  SKIN: skin color, texture, turgor are normal, chronic scattered hyperpigmentation without significant lesions EYES: conjunctiva are pink and non-injected, sclera clear OROPHARYNX: no exudate, no erythema; lips, buccal mucosa, and tongue normal   NECK: supple, non-tender LYMPH:  no palpable lymphadenopathy in the cervical, axillary or supraclavicular lymph nodes.  LUNGS: clear to auscultation and percussion with normal breathing effort HEART: regular rate & rhythm and no murmurs and no lower extremity edema ABDOMEN: soft, non-tender, non-distended, normal bowel sounds Musculoskeletal: no cyanosis of digits and no clubbing  PSYCH: alert & oriented x 3, fluent speech NEURO: no focal motor/sensory deficits  LABORATORY DATA:  Pending    ASSESSMENT & PLAN Travis Rollins is a 58 y.o. African American male who was referred to us  for anemia and thrombocytopenia.   I have reviewed his notes from his recent hospitalization in addition to recent laboratory results.   HIV negative on 07/13/2024   Thrombocytopenia, likely secondary to liver dysfunction: --Labs today to check CBC w/diff, CMP, B12 level, folate level, Hep B and C serologies  Given his new anemia without known bleeding I have suggested checking iron levels as well   It has been recommended that he continue follow up with GI regarding.   He will continue follow up with the coumadin  clinic.   UPDATE:   CBC shows mild neutropenia with a value of 3.7 with ANC of 2.2. Hgb has significantly improved from 9 to 12.2 and platelets have recovered from 82 to 159.  CMP shows a creatinine of 1.06 and improved LFTS with a value of AST 41, ALT 63.  LDH 327 which is up from 195 (08/22) Iron/folate/B12 pending  I have spent a total of 60 minutes minutes of face-to-face and non-face-to-face time, preparing to see the patient, obtaining and/or reviewing separately obtained history, performing a medically appropriate examination, counseling and educating the patient, ordering medications/tests/procedures, referring and communicating with other health care professionals, documenting clinical information in the electronic health record, independently interpreting results and  communicating results to the patient, and care coordination.    Lauraine Dais PA-C Department of Hematology/Oncology [facility] at [facility]

## 2024-08-04 ENCOUNTER — Ambulatory Visit: Payer: Self-pay | Admitting: Medical Oncology

## 2024-08-04 LAB — HEPATITIS B CORE ANTIBODY, TOTAL: HEP B CORE AB: NEGATIVE

## 2024-08-11 ENCOUNTER — Ambulatory Visit (INDEPENDENT_AMBULATORY_CARE_PROVIDER_SITE_OTHER): Admitting: Pulmonary Disease

## 2024-08-11 ENCOUNTER — Encounter: Payer: Self-pay | Admitting: Pulmonary Disease

## 2024-08-11 ENCOUNTER — Telehealth: Payer: Self-pay | Admitting: Internal Medicine

## 2024-08-11 VITALS — BP 122/77 | HR 90 | Temp 97.9°F | Ht 66.0 in | Wt 240.0 lb

## 2024-08-11 DIAGNOSIS — I2602 Saddle embolus of pulmonary artery with acute cor pulmonale: Secondary | ICD-10-CM

## 2024-08-11 DIAGNOSIS — J9601 Acute respiratory failure with hypoxia: Secondary | ICD-10-CM | POA: Diagnosis not present

## 2024-08-11 NOTE — Patient Instructions (Signed)
 It is nice to see you again  We will repeat an echocardiogram which is a heart ultrasound as well as a CT scan with contrast in November to ensure that the clot and the stress on the heart has gotten better  If not, we will need to discuss next steps and help from other people  Continue your blood thinner as you are, continue frequent follow-ups with the hematologist Dr. Timmy as well as with your primary care provider.  Return to clinic in 3 months after echocardiogram and CT scan

## 2024-08-11 NOTE — Telephone Encounter (Signed)
 Inbound call from patient requesting to speak with a nurse in regards to a scan he recently had done on his liver.   Please advise.

## 2024-08-11 NOTE — Progress Notes (Signed)
 @Patient  ID: Travis Rollins, male    DOB: [birth year], 58 y.o.   MRN: [id number]  Chief Complaint  Patient presents with   Medical Management of Chronic Issues    Pft f/u     Referring provider: [name], NP  HPI:   58 y.o. 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  Multiple hospital notes reviewed.  Sound like he passed out.  More short of breath.  Went to the ED.  Large clot burden on my review interpretation.  RV size enlarged.  TTE demonstrated reduced RV function.  Mechanical thrombectomy was performed.  He is feeling better.  INR notably was 1.3 at time of admission.  Low.  He is feeling better now.  Breathing easier.  No real issues.  Taking his warfarin as prescribed.  States last INR was 3..  He states he has follow-up with Dr. Timmy hematologist as well as his PCP for ongoing checks.  Next INR check is tomorrow per his report.  We discussed repeating echocardiogram and CTA to ensure resolution of clot and if there is ongoing signs of RV dysfunction and clot needing additional help.  HPI at initial visit: Patient with multiple PEs for send 2005.  Was on warfarin and Xarelto.  Another PE on anticoagulation and 2015.  IVC filter placed.  Sounds like transition to Eliquis at that time.  Unfortunately had another PE 2022 on Eliquis.  Decision was made to keep him on warfarin with a higher INR goal of 3-3.5.  He states his PCP is monitoring this and managing this.  I see no record of this I can view PCP records.  Has not seen his hematologist in over 2 years.  He does report dose changes over time.  But he cannot tell me what his most recent INR was when asked.  TTE 05/2021 at time of most recent PE with normal RV size and function, normal RA size, reassuring with trivial to no tricuspid  regurgitation.  Questionaires / Pulmonary Flowsheets:   ACT:      No data to display          MMRC:     No data to display          Epworth:      No data to display          Tests:   FENO:  No results found for: NITRICOXIDE  PFT:    Latest Ref Rng & Units 09/15/2023   12:55 PM 08/23/2021   11:36 AM  PFT Results  FVC-Pre L 2.71  2.88   FVC-Predicted Pre % 61  78   FVC-Post L 2.57  2.77   FVC-Predicted Post % 58  75   Pre FEV1/FVC % % 88  87   Post FEV1/FCV % % 84  88   FEV1-Pre L 2.38  2.50   FEV1-Predicted Pre % 70  85   FEV1-Post L 2.15  2.44   DLCO uncorrected ml/min/mmHg 19.61  20.91   DLCO UNC% % 76  80   DLCO corrected ml/min/mmHg 19.61  20.91   DLCO COR %Predicted % 76  80   DLVA Predicted % 111  113   TLC L 5.27  4.18   TLC % Predicted % 82  65   RV % Predicted % 128  67   Personally  reviewed interpreted spirometry suggestive of moderate restriction versus air trapping.  No bronchodilator response.  Lung volumes consistent with air trapping.  DLCO is within normal limits and likely underestimated given volume used to calculate this is more than 20% less than TLC measured  WALK:      No data to display          Imaging: Personally reviewed and as per EMR discussion in this note VAS US  GROIN PSEUDOANEURYSM Result Date: 07/19/2024  ARTERIAL PSEUDOANEURYSM  Patient Name:  DELVONTE BERENSON  Date of Exam:   07/19/2024 Medical Rec #: [id number]           Accession #:    [id number] Date of Birth: Apr 28, 1966            Patient Gender: M Patient Age:   24 years Exam Location:  [facility] Procedure:      VAS US  GROIN PSEUDOANEURYSM Referring Phys: WARREN DAIS --------------------------------------------------------------------------------  Exam: Right groin Indications: Patient complains of palpable area around access site. Limitations: bandage that pt states was placed a couple of hours ago due to              bleeding so didn't remove  Performing Technologist: Elmarie Lindau, RVT  Examination Guidelines: A complete evaluation includes B-mode imaging, spectral Doppler, color Doppler, and power Doppler as needed of all accessible portions of each vessel. Bilateral testing is considered an integral part of a complete examination. Limited examinations for reoccurring indications may be performed as noted. +------------+----------+---------+------+----------+ Right DuplexPSV (cm/s)Waveform PlaqueComment(s) +------------+----------+---------+------+----------+ CFA             95    triphasic                 +------------+----------+---------+------+----------+ PFA             75    triphasic                 +------------+----------+---------+------+----------+ Prox SFA        86    triphasic                 +------------+----------+---------+------+----------+ Right Vein comments:patent veins  Summary: No evidence of pseudoaneurysm, AVF or DVT  Diagnosing physician: Penne Colorado MD Electronically signed by Penne Colorado MD on 07/19/2024 at 10:04:15 PM.    --------------------------------------------------------------------------------    Final    VAS US  LOWER EXTREMITY VENOUS (DVT) Result Date: 07/14/2024  Lower Venous DVT Study Patient Name:  AMARIS GARRETTE  Date of Exam:   07/14/2024 Medical Rec #: [id number]           Accession #:    [id number] Date of Birth: 30-Jul-1966            Patient Gender: M Patient Age:   68 years Exam Location:  [facility] Procedure:      VAS US  LOWER EXTREMITY VENOUS (DVT) Referring Phys: JOHN PAYNE --------------------------------------------------------------------------------  Indications: Pulmonary embolism.  Risk Factors: Hx of DVT and PE. Performing Technologist: Elmarie Lindau, RVT  Examination Guidelines: A complete evaluation includes B-mode imaging, spectral Doppler, color Doppler, and power Doppler as needed of all accessible portions of each vessel. Bilateral testing is considered  an integral part of a complete examination. Limited examinations for reoccurring indications may be performed as noted. The reflux portion of the exam is performed with the patient in reverse Trendelenburg.  +---------+---------------+---------+-----------+----------+-------------------+ RIGHT    CompressibilityPhasicitySpontaneityPropertiesThrombus Aging      +---------+---------------+---------+-----------+----------+-------------------+ CFV  bandage and pain    +---------+---------------+---------+-----------+----------+-------------------+ SFJ                                                   Not well visualized +---------+---------------+---------+-----------+----------+-------------------+ FV Prox  Full                                                             +---------+---------------+---------+-----------+----------+-------------------+ FV Mid   Full                                                             +---------+---------------+---------+-----------+----------+-------------------+ FV DistalFull                                                             +---------+---------------+---------+-----------+----------+-------------------+ PFV      Full                                                             +---------+---------------+---------+-----------+----------+-------------------+ POP      Partial                                      Age Indeterminate   +---------+---------------+---------+-----------+----------+-------------------+ PTV      Full                                                             +---------+---------------+---------+-----------+----------+-------------------+ PERO     Full                                                             +---------+---------------+---------+-----------+----------+-------------------+ Gastroc  None                                          Acute               +---------+---------------+---------+-----------+----------+-------------------+   +---------+---------------+---------+-----------+----------+-------------------+ LEFT     CompressibilityPhasicitySpontaneityPropertiesThrombus Aging      +---------+---------------+---------+-----------+----------+-------------------+ CFV      Full  Yes      Yes                                      +---------+---------------+---------+-----------+----------+-------------------+ SFJ      Full                                                             +---------+---------------+---------+-----------+----------+-------------------+ FV Prox  Full                                                             +---------+---------------+---------+-----------+----------+-------------------+ FV Mid   Partial                                      Chronic             +---------+---------------+---------+-----------+----------+-------------------+ FV DistalFull                                                             +---------+---------------+---------+-----------+----------+-------------------+ PFV      Full                                                             +---------+---------------+---------+-----------+----------+-------------------+ POP      Partial                                      acute on chronic    +---------+---------------+---------+-----------+----------+-------------------+ PTV      Full                                                             +---------+---------------+---------+-----------+----------+-------------------+ PERO                                                  Not well visualized +---------+---------------+---------+-----------+----------+-------------------+     Summary: RIGHT: - Findings consistent with acute deep vein thrombosis involving the gastrocnemius, and right popliteal  vein.   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  *See table(s) above for measurements and observations. Electronically signed by Debby Robertson on 07/14/2024 at  5:14:16 PM.    Final    US  Abdomen Complete Result Date: 07/14/2024 CLINICAL DATA:  Thrombocytopenia. EXAM: ABDOMEN ULTRASOUND COMPLETE COMPARISON:  CT scan 07/12/2024 FINDINGS: Gallbladder: No gallstones or wall thickening visualized. No sonographic Murphy sign noted by sonographer. Common bile duct: Diameter: Could not be visualized. Liver: Coarsening of hepatic echotexture noted. Right hepatic lobe not well visualized. Portal vein is patent on color Doppler imaging with normal direction of blood flow towards the liver. IVC: Not well visualized due to body habitus, immobility, and bowel gas. Pancreas: Not well visualized due to body habitus, immobility, and bowel gas. Spleen: 10.3 cm craniocaudal length. Partially obscured by bowel gas. Right Kidney: Length: 11.4 cm. Echogenicity within normal limits. No mass or hydronephrosis visualized. Left Kidney: Length: 11.0 cm. Echogenicity within normal limits. No mass or hydronephrosis visualized. Abdominal aorta: Not well visualized due to body habitus, immobility, and bowel gas. Other findings: None. IMPRESSION: 1. Limited study due to body habitus, immobility, and bowel gas. 2. Coarsening of hepatic echotexture. Right hepatic lobe not well visualized. 3. No evidence for splenomegaly. Electronically Signed   By: Camellia Candle M.D.   On: 07/14/2024 09:13   ECHOCARDIOGRAM COMPLETE Result Date: 07/13/2024    ECHOCARDIOGRAM REPORT   Patient Name:   ABDULHAMID OLGIN Date of Exam: 07/13/2024 Medical Rec #:  [id number]          Height:       66.0 in Accession #:    [id number]         Weight:       226.2 lb Date of Birth:  [birth year]           BSA:          2.107 m Patient Age:    58 years           BP:            108/82 mmHg Patient Gender: M                  HR:           87 bpm. Exam Location:  Inpatient Procedure: 2D Echo, Color Doppler and Cardiac Doppler (Both Spectral and Color            Flow Doppler were utilized during procedure). Indications:    Pulmonary embolus  History:        Patient has prior history of Echocardiogram examinations, most                 recent 05/21/2021. Signs/Symptoms:Chest Pain.  Sonographer:    Benard Stallion Referring Phys: [id number] NORLEEN BIRCH PAYNE  Sonographer Comments: Technically difficult study due to poor echo windows. IMPRESSIONS  1. Left ventricular ejection fraction, by estimation, is 45 to 50%. The left ventricle has mildly decreased function. Left ventricular endocardial border not optimally defined to evaluate regional wall motion. Left ventricular diastolic parameters were grossly normal.  2. Right ventricular systolic function is mildly reduced. The right ventricular size is normal.  3. The mitral valve is grossly normal. Trivial mitral valve regurgitation. No evidence of mitral stenosis.  4. The aortic valve is grossly normal. Aortic valve regurgitation is mild. No aortic stenosis is present. FINDINGS  Left Ventricle: Left ventricular ejection fraction, by estimation, is 45 to 50%. The left ventricle has mildly decreased function. Left ventricular endocardial border not optimally defined to evaluate regional wall motion. The left ventricular internal cavity size was normal in size. There is no left ventricular hypertrophy.  Left ventricular diastolic parameters were normal. Right Ventricle: The right ventricular size is normal. No increase in right ventricular wall thickness. Right ventricular systolic function is mildly reduced. Left Atrium: Left atrial size was normal in size. Right Atrium: Right atrial size was normal in size. Pericardium: There is no evidence of pericardial effusion. Mitral Valve: The mitral valve is grossly normal. Trivial mitral valve regurgitation. No  evidence of mitral valve stenosis. Tricuspid Valve: The tricuspid valve is normal in structure. Tricuspid valve regurgitation is trivial. No evidence of tricuspid stenosis. Aortic Valve: The aortic valve is grossly normal. Aortic valve regurgitation is mild. Aortic regurgitation PHT measures 636 msec. No aortic stenosis is present. Aortic valve mean gradient measures 5.0 mmHg. Aortic valve peak gradient measures 8.6 mmHg. Aortic valve area, by VTI measures 2.38 cm. Pulmonic Valve: The pulmonic valve was normal in structure. Pulmonic valve regurgitation is not visualized. No evidence of pulmonic stenosis. Aorta: The aortic root is normal in size and structure. Venous: The inferior vena cava was not well visualized. IAS/Shunts: No atrial level shunt detected by color flow Doppler.  LEFT VENTRICLE PLAX 2D LVIDd:         4.60 cm   Diastology LVIDs:         3.60 cm   LV e' medial:    8.27 cm/s LV PW:         0.90 cm   LV E/e' medial:  7.1 LV IVS:        0.90 cm   LV e' lateral:   10.00 cm/s LVOT diam:     2.20 cm   LV E/e' lateral: 5.9 LV SV:         60 LV SV Index:   28 LVOT Area:     3.80 cm  RIGHT VENTRICLE RV S prime:     10.60 cm/s TAPSE (M-mode): 1.9 cm LEFT ATRIUM             Index        RIGHT ATRIUM           Index LA diam:        3.30 cm 1.57 cm/m   RA Area:     20.50 cm LA Vol (A2C):   42.0 ml 19.93 ml/m  RA Volume:   64.50 ml  30.61 ml/m LA Vol (A4C):   43.2 ml 20.50 ml/m LA Biplane Vol: 42.5 ml 20.17 ml/m  AORTIC VALVE AV Area (Vmax):    2.34 cm AV Area (Vmean):   2.14 cm AV Area (VTI):     2.38 cm AV Vmax:           146.50 cm/s AV Vmean:          100.800 cm/s AV VTI:            0.250 m AV Peak Grad:      8.6 mmHg AV Mean Grad:      5.0 mmHg LVOT Vmax:         90.30 cm/s LVOT Vmean:        56.700 cm/s LVOT VTI:          0.157 m LVOT/AV VTI ratio: 0.63 AI PHT:            636 msec  AORTA Ao Root diam: 3.20 cm MITRAL VALVE               TRICUSPID VALVE MV Area (PHT): 4.15 cm    TR Peak grad:   15.8  mmHg MV  Decel Time: 183 msec    TR Vmax:        199.00 cm/s MV E velocity: 58.90 cm/s MV A velocity: 79.80 cm/s  SHUNTS MV E/A ratio:  0.74        Systemic VTI:  0.16 m                            Systemic Diam: 2.20 cm Soyla Merck MD Electronically signed by Soyla Merck MD Signature Date/Time: 07/13/2024/11:08:18 AM    Final    IR THROMBECT PRIM MECH INIT (INCLU) MOD SED Result Date: 07/13/2024 INDICATION: [age]-year-old male with history of acute, high risk pulmonary embolism. EXAM: 1. Ultrasound-guided vascular access of the right common femoral vein. 2. Inferior vena cavogram. 3. Ultrasound-guided vascular access of the right internal jugular vein. 4. Inferior vena cava filter retrieval. 5. Pulmonary angiogram. 6. Central manometry. 7. Bilateral pulmonary artery aspiration thrombectomy. 8. Inferior vena cava filter placement. COMPARISON:  CTA chest from earlier the same day MEDICATIONS: 10,000 units heparin , intravenous ANESTHESIA/SEDATION: Moderate (conscious) sedation was employed during this procedure. A total of Versed  3 mg and Fentanyl  0 mcg was administered intravenously. Moderate Sedation Time: 97 minutes. The patient's level of consciousness and vital signs were monitored continuously by radiology nursing throughout the procedure under my direct supervision. FLUOROSCOPY TIME:  Nine hundred sixteen mGy reference air kerma COMPLICATIONS: None immediate. TECHNIQUE: Informed written consent was obtained from the patient after a thorough discussion of the procedural risks, benefits and alternatives. All questions were addressed. Maximal Sterile Barrier Technique was utilized including caps, mask, sterile gowns, sterile gloves, sterile drape, hand hygiene and skin antiseptic. A timeout was performed prior to the initiation of the procedure. Preprocedure ultrasound evaluation demonstrated patency of the right common femoral vein. The procedure was planned. Subdermal Local anesthesia was  administered 1% lidocaine . A small skin nick was made. Under direct ultrasound visualization, the right common femoral vein was accessed with a 21 gauge micropuncture needle. A permanent ultrasound image was captured and stored in the record. Micropuncture sheath was inserted followed by placement of a Wholey wire was directed to the inferior vena cava under fluoroscopic guidance. An 8 [place] vascular sheath was placed. A pigtail catheter was inserted to the peripheral inferior vena cava and inferior vena cavagram was performed. Inferior vena cavagram was significant for presence of an infrarenal IVC filter with possible nonocclusive thrombus about the apex of the filter with sluggish antegrade flow. Given failure of anticoagulation and indwelling IVC filter with presence of thrombus, decision was made to remove the indwelling filter. Therefore, the right internal jugular vein was accessed with a 21 gauge micropuncture needle. A permanent ultrasound image was captured and stored in the record. A micropuncture sheath was introduced through which a Wholey wire was advanced to the peripheral inferior vena cava. Serial dilation was performed followed by introduction of an argon triple loop snare IVC filter retrieval coaxial kit. The snare was then used to capture the apex of the filter which was retrieved without complication. Repeat inferior vena cavagram was performed which demonstrated patency of the normal caliber inferior vena cava without evidence of filling defect or extravasation. Attention was then turned toward PE thrombectomy. In pre close fashion, 2 ProGlides at the 10 o'clock and 2 o'clock positions. Over the wire, a 24 [place] Inari sheath was then placed and directed to the inferior vena cava. Over the wire, a double angle pigtail catheter was inserted and under fluoroscopic  guidance was attempted to be directed through the right atrium and right ventricle to the main pulmonary artery, however due to the  size of the right heart this was difficult. Therefore, a Swan-Ganz catheter was inserted with the balloon inflated which was floated to the main pulmonary artery. Central manometry was performed at this location with pulmonary arterial pressure of 70/38 with a mean of 45 mm Hg. The balloon was deflated and over a 0.025 guidewire the Swan-Ganz catheter was exchanged for a 4 French angled tip glide catheter. A Wholey wire was then inserted and directed into the left inferior pulmonary artery. The catheter was advanced to this location. The catheter was then exchanged for a 6 French angled tip select catheter. The wire was exchanged for a short taper superstiff Amplatz wire. The catheter was removed. A 24 [place] Flowtreiver aspiration catheter was then directed under fluoroscopic guidance to the main pulmonary artery. The inner dilator was removed. Pulmonary angiogram was then performed which demonstrated similar filling defects in the main and bilateral pulmonary arteries compatible with acute pulmonary embolism. The aspiration catheter was then advanced to the left pulmonary artery. Multiple aspirations were performed which yielded a large volume of acute appearing thrombus. Left pulmonary angiogram was then repeated which demonstrated significantly improved patency and perfusion of the left lung. There is a small volume additional thrombus in the proximal left inferior lobar pulmonary artery. Therefore, the T20 curved aspiration catheter was advanced in coaxial fashion and aspiration was performed which yielded additional small volume acute appearing thrombus. The coaxial system was then directed to the right pulmonary artery in the wire was inserted into the right inferior lobar pulmonary artery. Aspiration thrombectomy was then performed which yielded large volume acute appearing thrombus. The large volume thrombus was affixed to the distal tip of the aspiration catheter and required removal of the coaxial  aspiration device over the wire and Ree insertion. At this point, wire access to the right pulmonary artery was lost. Given significant improvement in vital signs and large volume thrombus, the pulmonary artery was not selected for completion measurements. At this point, the patient's tachycardia improved and oxygen saturations improved. The catheters were removed. Repeat inferior vena cavagram was then performed for planning purposes which demonstrated patency and again no evidence of extravasation. Over the wire, an option elite IVC filter was placed in an infrarenal location under fluoroscopic visualization. The sheath was then removed and the ProGlides were tied and cut. There was insufficient hemostasis, therefore a 0 Prolene pursestring stitch was placed about the right groin access site. Hemostasis was achieved at the right neck and a pressure dressing was applied. The patient tolerated the procedure well and was transferred to the ICU in good condition. IMPRESSION: 1. Inferior vena cavogram demonstrates indwelling inferior vena cava filter with possible adherent thrombus. 2. Technically successful inferior vena cava filter retrieval. 3. Successful bilateral pulmonary artery thrombectomy with removal of large volume acute appearing thrombus. 4. Technically successful inferior vena cava filter placement. Ester Sides, MD [facility] Electronically Signed   By: Ester Sides M.D.   On: 07/13/2024 08:49   IR US  Guide Vasc Access Right Result Date: 07/13/2024 INDICATION: [age]-year-old male with history of acute, high risk pulmonary embolism. EXAM: 1. Ultrasound-guided vascular access of the right common femoral vein. 2. Inferior vena cavogram. 3. Ultrasound-guided vascular access of the right internal jugular vein. 4. Inferior vena cava filter retrieval. 5. Pulmonary angiogram. 6. Central manometry. 7. Bilateral pulmonary artery aspiration  thrombectomy. 8. Inferior vena cava filter placement. COMPARISON:  CTA chest from earlier the same day MEDICATIONS: 10,000 units heparin , intravenous ANESTHESIA/SEDATION: Moderate (conscious) sedation was employed during this procedure. A total of Versed  3 mg and Fentanyl  0 mcg was administered intravenously. Moderate Sedation Time: 97 minutes. The patient's level of consciousness and vital signs were monitored continuously by radiology nursing throughout the procedure under my direct supervision. FLUOROSCOPY TIME:  Nine hundred sixteen mGy reference air kerma COMPLICATIONS: None immediate. TECHNIQUE: Informed written consent was obtained from the patient after a thorough discussion of the procedural risks, benefits and alternatives. All questions were addressed. Maximal Sterile Barrier Technique was utilized including caps, mask, sterile gowns, sterile gloves, sterile drape, hand hygiene and skin antiseptic. A timeout was performed prior to the initiation of the procedure. Preprocedure ultrasound evaluation demonstrated patency of the right common femoral vein. The procedure was planned. Subdermal Local anesthesia was administered 1% lidocaine . A small skin nick was made. Under direct ultrasound visualization, the right common femoral vein was accessed with a 21 gauge micropuncture needle. A permanent ultrasound image was captured and stored in the record. Micropuncture sheath was inserted followed by placement of a Wholey wire was directed to the inferior vena cava under fluoroscopic guidance. An 8 [place] vascular sheath was placed. A pigtail catheter was inserted to the peripheral inferior vena cava and inferior vena cavagram was performed. Inferior vena cavagram was significant for presence of an infrarenal IVC filter with possible nonocclusive thrombus about the apex of the filter with sluggish antegrade flow. Given failure of anticoagulation and indwelling IVC filter with presence of thrombus, decision was  made to remove the indwelling filter. Therefore, the right internal jugular vein was accessed with a 21 gauge micropuncture needle. A permanent ultrasound image was captured and stored in the record. A micropuncture sheath was introduced through which a Wholey wire was advanced to the peripheral inferior vena cava. Serial dilation was performed followed by introduction of an argon triple loop snare IVC filter retrieval coaxial kit. The snare was then used to capture the apex of the filter which was retrieved without complication. Repeat inferior vena cavagram was performed which demonstrated patency of the normal caliber inferior vena cava without evidence of filling defect or extravasation. Attention was then turned toward PE thrombectomy. In pre close fashion, 2 ProGlides at the 10 o'clock and 2 o'clock positions. Over the wire, a 24 [place] Inari sheath was then placed and directed to the inferior vena cava. Over the wire, a double angle pigtail catheter was inserted and under fluoroscopic guidance was attempted to be directed through the right atrium and right ventricle to the main pulmonary artery, however due to the size of the right heart this was difficult. Therefore, a Swan-Ganz catheter was inserted with the balloon inflated which was floated to the main pulmonary artery. Central manometry was performed at this location with pulmonary arterial pressure of 70/38 with a mean of 45 mm Hg. The balloon was deflated and over a 0.025 guidewire the Swan-Ganz catheter was exchanged for a 4 French angled tip glide catheter. A Wholey wire was then inserted and directed into the left inferior pulmonary artery. The catheter was advanced to this location. The catheter was then exchanged for a 6 French angled tip select catheter. The wire was exchanged for a short taper superstiff Amplatz wire. The catheter was removed. A 24 [place] Flowtreiver aspiration catheter was then directed under fluoroscopic guidance to the main  pulmonary artery. The inner dilator was removed.  Pulmonary angiogram was then performed which demonstrated similar filling defects in the main and bilateral pulmonary arteries compatible with acute pulmonary embolism. The aspiration catheter was then advanced to the left pulmonary artery. Multiple aspirations were performed which yielded a large volume of acute appearing thrombus. Left pulmonary angiogram was then repeated which demonstrated significantly improved patency and perfusion of the left lung. There is a small volume additional thrombus in the proximal left inferior lobar pulmonary artery. Therefore, the T20 curved aspiration catheter was advanced in coaxial fashion and aspiration was performed which yielded additional small volume acute appearing thrombus. The coaxial system was then directed to the right pulmonary artery in the wire was inserted into the right inferior lobar pulmonary artery. Aspiration thrombectomy was then performed which yielded large volume acute appearing thrombus. The large volume thrombus was affixed to the distal tip of the aspiration catheter and required removal of the coaxial aspiration device over the wire and Ree insertion. At this point, wire access to the right pulmonary artery was lost. Given significant improvement in vital signs and large volume thrombus, the pulmonary artery was not selected for completion measurements. At this point, the patient's tachycardia improved and oxygen saturations improved. The catheters were removed. Repeat inferior vena cavagram was then performed for planning purposes which demonstrated patency and again no evidence of extravasation. Over the wire, an option elite IVC filter was placed in an infrarenal location under fluoroscopic visualization. The sheath was then removed and the ProGlides were tied and cut. There was insufficient hemostasis, therefore a 0 Prolene pursestring stitch was placed about the right groin access site. Hemostasis  was achieved at the right neck and a pressure dressing was applied. The patient tolerated the procedure well and was transferred to the ICU in good condition. IMPRESSION: 1. Inferior vena cavogram demonstrates indwelling inferior vena cava filter with possible adherent thrombus. 2. Technically successful inferior vena cava filter retrieval. 3. Successful bilateral pulmonary artery thrombectomy with removal of large volume acute appearing thrombus. 4. Technically successful inferior vena cava filter placement. Ester Sides, MD [facility] Radiology Electronically Signed   By: Ester Sides M.D.   On: 07/13/2024 08:49   IR US  Guide Vasc Access Right Result Date: 07/13/2024 INDICATION: [age]-year-old male with history of acute, high risk pulmonary embolism. EXAM: 1. Ultrasound-guided vascular access of the right common femoral vein. 2. Inferior vena cavogram. 3. Ultrasound-guided vascular access of the right internal jugular vein. 4. Inferior vena cava filter retrieval. 5. Pulmonary angiogram. 6. Central manometry. 7. Bilateral pulmonary artery aspiration thrombectomy. 8. Inferior vena cava filter placement. COMPARISON:  CTA chest from earlier the same day MEDICATIONS: 10,000 units heparin , intravenous ANESTHESIA/SEDATION: Moderate (conscious) sedation was employed during this procedure. A total of Versed  3 mg and Fentanyl  0 mcg was administered intravenously. Moderate Sedation Time: 97 minutes. The patient's level of consciousness and vital signs were monitored continuously by radiology nursing throughout the procedure under my direct supervision. FLUOROSCOPY TIME:  Nine hundred sixteen mGy reference air kerma COMPLICATIONS: None immediate. TECHNIQUE: Informed written consent was obtained from the patient after a thorough discussion of the procedural risks, benefits and alternatives. All questions were addressed. Maximal Sterile Barrier Technique was utilized  including caps, mask, sterile gowns, sterile gloves, sterile drape, hand hygiene and skin antiseptic. A timeout was performed prior to the initiation of the procedure. Preprocedure ultrasound evaluation demonstrated patency of the right common femoral vein. The procedure was planned. Subdermal Local anesthesia was administered 1% lidocaine . A small  skin nick was made. Under direct ultrasound visualization, the right common femoral vein was accessed with a 21 gauge micropuncture needle. A permanent ultrasound image was captured and stored in the record. Micropuncture sheath was inserted followed by placement of a Wholey wire was directed to the inferior vena cava under fluoroscopic guidance. An 8 [place] vascular sheath was placed. A pigtail catheter was inserted to the peripheral inferior vena cava and inferior vena cavagram was performed. Inferior vena cavagram was significant for presence of an infrarenal IVC filter with possible nonocclusive thrombus about the apex of the filter with sluggish antegrade flow. Given failure of anticoagulation and indwelling IVC filter with presence of thrombus, decision was made to remove the indwelling filter. Therefore, the right internal jugular vein was accessed with a 21 gauge micropuncture needle. A permanent ultrasound image was captured and stored in the record. A micropuncture sheath was introduced through which a Wholey wire was advanced to the peripheral inferior vena cava. Serial dilation was performed followed by introduction of an argon triple loop snare IVC filter retrieval coaxial kit. The snare was then used to capture the apex of the filter which was retrieved without complication. Repeat inferior vena cavagram was performed which demonstrated patency of the normal caliber inferior vena cava without evidence of filling defect or extravasation. Attention was then turned toward PE thrombectomy. In pre close fashion, 2 ProGlides at the 10 o'clock and 2 o'clock  positions. Over the wire, a 24 [place] Inari sheath was then placed and directed to the inferior vena cava. Over the wire, a double angle pigtail catheter was inserted and under fluoroscopic guidance was attempted to be directed through the right atrium and right ventricle to the main pulmonary artery, however due to the size of the right heart this was difficult. Therefore, a Swan-Ganz catheter was inserted with the balloon inflated which was floated to the main pulmonary artery. Central manometry was performed at this location with pulmonary arterial pressure of 70/38 with a mean of 45 mm Hg. The balloon was deflated and over a 0.025 guidewire the Swan-Ganz catheter was exchanged for a 4 French angled tip glide catheter. A Wholey wire was then inserted and directed into the left inferior pulmonary artery. The catheter was advanced to this location. The catheter was then exchanged for a 6 French angled tip select catheter. The wire was exchanged for a short taper superstiff Amplatz wire. The catheter was removed. A 24 [place] Flowtreiver aspiration catheter was then directed under fluoroscopic guidance to the main pulmonary artery. The inner dilator was removed. Pulmonary angiogram was then performed which demonstrated similar filling defects in the main and bilateral pulmonary arteries compatible with acute pulmonary embolism. The aspiration catheter was then advanced to the left pulmonary artery. Multiple aspirations were performed which yielded a large volume of acute appearing thrombus. Left pulmonary angiogram was then repeated which demonstrated significantly improved patency and perfusion of the left lung. There is a small volume additional thrombus in the proximal left inferior lobar pulmonary artery. Therefore, the T20 curved aspiration catheter was advanced in coaxial fashion and aspiration was performed which yielded additional small volume acute appearing thrombus. The coaxial system was then directed to  the right pulmonary artery in the wire was inserted into the right inferior lobar pulmonary artery. Aspiration thrombectomy was then performed which yielded large volume acute appearing thrombus. The large volume thrombus was affixed to the distal tip of the aspiration catheter and required removal of the coaxial aspiration device over the wire  and Ree insertion. At this point, wire access to the right pulmonary artery was lost. Given significant improvement in vital signs and large volume thrombus, the pulmonary artery was not selected for completion measurements. At this point, the patient's tachycardia improved and oxygen saturations improved. The catheters were removed. Repeat inferior vena cavagram was then performed for planning purposes which demonstrated patency and again no evidence of extravasation. Over the wire, an option elite IVC filter was placed in an infrarenal location under fluoroscopic visualization. The sheath was then removed and the ProGlides were tied and cut. There was insufficient hemostasis, therefore a 0 Prolene pursestring stitch was placed about the right groin access site. Hemostasis was achieved at the right neck and a pressure dressing was applied. The patient tolerated the procedure well and was transferred to the ICU in good condition. IMPRESSION: 1. Inferior vena cavogram demonstrates indwelling inferior vena cava filter with possible adherent thrombus. 2. Technically successful inferior vena cava filter retrieval. 3. Successful bilateral pulmonary artery thrombectomy with removal of large volume acute appearing thrombus. 4. Technically successful inferior vena cava filter placement. Ester Sides, MD [facility] Radiology Electronically Signed   By: Ester Sides M.D.   On: 07/13/2024 08:49   IR IVC FILTER PLMT / S&I PORTER GUID/MOD SED Result Date: 07/13/2024 INDICATION: [age]-year-old male with history of acute, high risk  pulmonary embolism. EXAM: 1. Ultrasound-guided vascular access of the right common femoral vein. 2. Inferior vena cavogram. 3. Ultrasound-guided vascular access of the right internal jugular vein. 4. Inferior vena cava filter retrieval. 5. Pulmonary angiogram. 6. Central manometry. 7. Bilateral pulmonary artery aspiration thrombectomy. 8. Inferior vena cava filter placement. COMPARISON:  CTA chest from earlier the same day MEDICATIONS: 10,000 units heparin , intravenous ANESTHESIA/SEDATION: Moderate (conscious) sedation was employed during this procedure. A total of Versed  3 mg and Fentanyl  0 mcg was administered intravenously. Moderate Sedation Time: 97 minutes. The patient's level of consciousness and vital signs were monitored continuously by radiology nursing throughout the procedure under my direct supervision. FLUOROSCOPY TIME:  Nine hundred sixteen mGy reference air kerma COMPLICATIONS: None immediate. TECHNIQUE: Informed written consent was obtained from the patient after a thorough discussion of the procedural risks, benefits and alternatives. All questions were addressed. Maximal Sterile Barrier Technique was utilized including caps, mask, sterile gowns, sterile gloves, sterile drape, hand hygiene and skin antiseptic. A timeout was performed prior to the initiation of the procedure. Preprocedure ultrasound evaluation demonstrated patency of the right common femoral vein. The procedure was planned. Subdermal Local anesthesia was administered 1% lidocaine . A small skin nick was made. Under direct ultrasound visualization, the right common femoral vein was accessed with a 21 gauge micropuncture needle. A permanent ultrasound image was captured and stored in the record. Micropuncture sheath was inserted followed by placement of a Wholey wire was directed to the inferior vena cava under fluoroscopic guidance. An 8 [place] vascular sheath was placed. A pigtail catheter was inserted to the peripheral inferior vena  cava and inferior vena cavagram was performed. Inferior vena cavagram was significant for presence of an infrarenal IVC filter with possible nonocclusive thrombus about the apex of the filter with sluggish antegrade flow. Given failure of anticoagulation and indwelling IVC filter with presence of thrombus, decision was made to remove the indwelling filter. Therefore, the right internal jugular vein was accessed with a 21 gauge micropuncture needle. A permanent ultrasound image was captured and stored in the record. A micropuncture sheath was introduced through which a Wholey wire  was advanced to the peripheral inferior vena cava. Serial dilation was performed followed by introduction of an argon triple loop snare IVC filter retrieval coaxial kit. The snare was then used to capture the apex of the filter which was retrieved without complication. Repeat inferior vena cavagram was performed which demonstrated patency of the normal caliber inferior vena cava without evidence of filling defect or extravasation. Attention was then turned toward PE thrombectomy. In pre close fashion, 2 ProGlides at the 10 o'clock and 2 o'clock positions. Over the wire, a 24 [place] Inari sheath was then placed and directed to the inferior vena cava. Over the wire, a double angle pigtail catheter was inserted and under fluoroscopic guidance was attempted to be directed through the right atrium and right ventricle to the main pulmonary artery, however due to the size of the right heart this was difficult. Therefore, a Swan-Ganz catheter was inserted with the balloon inflated which was floated to the main pulmonary artery. Central manometry was performed at this location with pulmonary arterial pressure of 70/38 with a mean of 45 mm Hg. The balloon was deflated and over a 0.025 guidewire the Swan-Ganz catheter was exchanged for a 4 French angled tip glide catheter. A Wholey wire was then inserted and directed into the left inferior pulmonary  artery. The catheter was advanced to this location. The catheter was then exchanged for a 6 French angled tip select catheter. The wire was exchanged for a short taper superstiff Amplatz wire. The catheter was removed. A 24 [place] Flowtreiver aspiration catheter was then directed under fluoroscopic guidance to the main pulmonary artery. The inner dilator was removed. Pulmonary angiogram was then performed which demonstrated similar filling defects in the main and bilateral pulmonary arteries compatible with acute pulmonary embolism. The aspiration catheter was then advanced to the left pulmonary artery. Multiple aspirations were performed which yielded a large volume of acute appearing thrombus. Left pulmonary angiogram was then repeated which demonstrated significantly improved patency and perfusion of the left lung. There is a small volume additional thrombus in the proximal left inferior lobar pulmonary artery. Therefore, the T20 curved aspiration catheter was advanced in coaxial fashion and aspiration was performed which yielded additional small volume acute appearing thrombus. The coaxial system was then directed to the right pulmonary artery in the wire was inserted into the right inferior lobar pulmonary artery. Aspiration thrombectomy was then performed which yielded large volume acute appearing thrombus. The large volume thrombus was affixed to the distal tip of the aspiration catheter and required removal of the coaxial aspiration device over the wire and Ree insertion. At this point, wire access to the right pulmonary artery was lost. Given significant improvement in vital signs and large volume thrombus, the pulmonary artery was not selected for completion measurements. At this point, the patient's tachycardia improved and oxygen saturations improved. The catheters were removed. Repeat inferior vena cavagram was then performed for planning purposes which demonstrated patency and again no evidence of  extravasation. Over the wire, an option elite IVC filter was placed in an infrarenal location under fluoroscopic visualization. The sheath was then removed and the ProGlides were tied and cut. There was insufficient hemostasis, therefore a 0 Prolene pursestring stitch was placed about the right groin access site. Hemostasis was achieved at the right neck and a pressure dressing was applied. The patient tolerated the procedure well and was transferred to the ICU in good condition. IMPRESSION: 1. Inferior vena cavogram demonstrates indwelling inferior vena cava filter with possible adherent thrombus.  2. Technically successful inferior vena cava filter retrieval. 3. Successful bilateral pulmonary artery thrombectomy with removal of large volume acute appearing thrombus. 4. Technically successful inferior vena cava filter placement. Ester Sides, MD [facility] Radiology Electronically Signed   By: Ester Sides M.D.   On: 07/13/2024 08:49   IR IVC Filter Retrieval / S&I /Img Guid/Mod Sed Result Date: 07/13/2024 INDICATION: [age]-year-old male with history of acute, high risk pulmonary embolism. EXAM: 1. Ultrasound-guided vascular access of the right common femoral vein. 2. Inferior vena cavogram. 3. Ultrasound-guided vascular access of the right internal jugular vein. 4. Inferior vena cava filter retrieval. 5. Pulmonary angiogram. 6. Central manometry. 7. Bilateral pulmonary artery aspiration thrombectomy. 8. Inferior vena cava filter placement. COMPARISON:  CTA chest from earlier the same day MEDICATIONS: 10,000 units heparin , intravenous ANESTHESIA/SEDATION: Moderate (conscious) sedation was employed during this procedure. A total of Versed  3 mg and Fentanyl  0 mcg was administered intravenously. Moderate Sedation Time: 97 minutes. The patient's level of consciousness and vital signs were monitored continuously by radiology nursing throughout the procedure under  my direct supervision. FLUOROSCOPY TIME:  Nine hundred sixteen mGy reference air kerma COMPLICATIONS: None immediate. TECHNIQUE: Informed written consent was obtained from the patient after a thorough discussion of the procedural risks, benefits and alternatives. All questions were addressed. Maximal Sterile Barrier Technique was utilized including caps, mask, sterile gowns, sterile gloves, sterile drape, hand hygiene and skin antiseptic. A timeout was performed prior to the initiation of the procedure. Preprocedure ultrasound evaluation demonstrated patency of the right common femoral vein. The procedure was planned. Subdermal Local anesthesia was administered 1% lidocaine . A small skin nick was made. Under direct ultrasound visualization, the right common femoral vein was accessed with a 21 gauge micropuncture needle. A permanent ultrasound image was captured and stored in the record. Micropuncture sheath was inserted followed by placement of a Wholey wire was directed to the inferior vena cava under fluoroscopic guidance. An 8 [place] vascular sheath was placed. A pigtail catheter was inserted to the peripheral inferior vena cava and inferior vena cavagram was performed. Inferior vena cavagram was significant for presence of an infrarenal IVC filter with possible nonocclusive thrombus about the apex of the filter with sluggish antegrade flow. Given failure of anticoagulation and indwelling IVC filter with presence of thrombus, decision was made to remove the indwelling filter. Therefore, the right internal jugular vein was accessed with a 21 gauge micropuncture needle. A permanent ultrasound image was captured and stored in the record. A micropuncture sheath was introduced through which a Wholey wire was advanced to the peripheral inferior vena cava. Serial dilation was performed followed by introduction of an argon triple loop snare IVC filter retrieval coaxial kit. The snare was then used to capture the apex of  the filter which was retrieved without complication. Repeat inferior vena cavagram was performed which demonstrated patency of the normal caliber inferior vena cava without evidence of filling defect or extravasation. Attention was then turned toward PE thrombectomy. In pre close fashion, 2 ProGlides at the 10 o'clock and 2 o'clock positions. Over the wire, a 24 [place] Inari sheath was then placed and directed to the inferior vena cava. Over the wire, a double angle pigtail catheter was inserted and under fluoroscopic guidance was attempted to be directed through the right atrium and right ventricle to the main pulmonary artery, however due to the size of the right heart this was difficult. Therefore, a Swan-Ganz catheter was inserted with the balloon inflated  which was floated to the main pulmonary artery. Central manometry was performed at this location with pulmonary arterial pressure of 70/38 with a mean of 45 mm Hg. The balloon was deflated and over a 0.025 guidewire the Swan-Ganz catheter was exchanged for a 4 French angled tip glide catheter. A Wholey wire was then inserted and directed into the left inferior pulmonary artery. The catheter was advanced to this location. The catheter was then exchanged for a 6 French angled tip select catheter. The wire was exchanged for a short taper superstiff Amplatz wire. The catheter was removed. A 24 [place] Flowtreiver aspiration catheter was then directed under fluoroscopic guidance to the main pulmonary artery. The inner dilator was removed. Pulmonary angiogram was then performed which demonstrated similar filling defects in the main and bilateral pulmonary arteries compatible with acute pulmonary embolism. The aspiration catheter was then advanced to the left pulmonary artery. Multiple aspirations were performed which yielded a large volume of acute appearing thrombus. Left pulmonary angiogram was then repeated which demonstrated significantly improved patency and  perfusion of the left lung. There is a small volume additional thrombus in the proximal left inferior lobar pulmonary artery. Therefore, the T20 curved aspiration catheter was advanced in coaxial fashion and aspiration was performed which yielded additional small volume acute appearing thrombus. The coaxial system was then directed to the right pulmonary artery in the wire was inserted into the right inferior lobar pulmonary artery. Aspiration thrombectomy was then performed which yielded large volume acute appearing thrombus. The large volume thrombus was affixed to the distal tip of the aspiration catheter and required removal of the coaxial aspiration device over the wire and Ree insertion. At this point, wire access to the right pulmonary artery was lost. Given significant improvement in vital signs and large volume thrombus, the pulmonary artery was not selected for completion measurements. At this point, the patient's tachycardia improved and oxygen saturations improved. The catheters were removed. Repeat inferior vena cavagram was then performed for planning purposes which demonstrated patency and again no evidence of extravasation. Over the wire, an option elite IVC filter was placed in an infrarenal location under fluoroscopic visualization. The sheath was then removed and the ProGlides were tied and cut. There was insufficient hemostasis, therefore a 0 Prolene pursestring stitch was placed about the right groin access site. Hemostasis was achieved at the right neck and a pressure dressing was applied. The patient tolerated the procedure well and was transferred to the ICU in good condition. IMPRESSION: 1. Inferior vena cavogram demonstrates indwelling inferior vena cava filter with possible adherent thrombus. 2. Technically successful inferior vena cava filter retrieval. 3. Successful bilateral pulmonary artery thrombectomy with removal of large volume acute appearing thrombus. 4. Technically successful  inferior vena cava filter placement. Ester Sides, MD [facility] Radiology Electronically Signed   By: Ester Sides M.D.   On: 07/13/2024 08:49   IR Venocavagram Ivc Result Date: 07/13/2024 INDICATION: [age]-year-old male with history of acute, high risk pulmonary embolism. EXAM: 1. Ultrasound-guided vascular access of the right common femoral vein. 2. Inferior vena cavogram. 3. Ultrasound-guided vascular access of the right internal jugular vein. 4. Inferior vena cava filter retrieval. 5. Pulmonary angiogram. 6. Central manometry. 7. Bilateral pulmonary artery aspiration thrombectomy. 8. Inferior vena cava filter placement. COMPARISON:  CTA chest from earlier the same day MEDICATIONS: 10,000 units heparin , intravenous ANESTHESIA/SEDATION: Moderate (conscious) sedation was employed during this procedure. A total of Versed  3 mg and Fentanyl  0 mcg was administered intravenously.  Moderate Sedation Time: 97 minutes. The patient's level of consciousness and vital signs were monitored continuously by radiology nursing throughout the procedure under my direct supervision. FLUOROSCOPY TIME:  Nine hundred sixteen mGy reference air kerma COMPLICATIONS: None immediate. TECHNIQUE: Informed written consent was obtained from the patient after a thorough discussion of the procedural risks, benefits and alternatives. All questions were addressed. Maximal Sterile Barrier Technique was utilized including caps, mask, sterile gowns, sterile gloves, sterile drape, hand hygiene and skin antiseptic. A timeout was performed prior to the initiation of the procedure. Preprocedure ultrasound evaluation demonstrated patency of the right common femoral vein. The procedure was planned. Subdermal Local anesthesia was administered 1% lidocaine . A small skin nick was made. Under direct ultrasound visualization, the right common femoral vein was accessed with a 21 gauge micropuncture needle.  A permanent ultrasound image was captured and stored in the record. Micropuncture sheath was inserted followed by placement of a Wholey wire was directed to the inferior vena cava under fluoroscopic guidance. An 8 [place] vascular sheath was placed. A pigtail catheter was inserted to the peripheral inferior vena cava and inferior vena cavagram was performed. Inferior vena cavagram was significant for presence of an infrarenal IVC filter with possible nonocclusive thrombus about the apex of the filter with sluggish antegrade flow. Given failure of anticoagulation and indwelling IVC filter with presence of thrombus, decision was made to remove the indwelling filter. Therefore, the right internal jugular vein was accessed with a 21 gauge micropuncture needle. A permanent ultrasound image was captured and stored in the record. A micropuncture sheath was introduced through which a Wholey wire was advanced to the peripheral inferior vena cava. Serial dilation was performed followed by introduction of an argon triple loop snare IVC filter retrieval coaxial kit. The snare was then used to capture the apex of the filter which was retrieved without complication. Repeat inferior vena cavagram was performed which demonstrated patency of the normal caliber inferior vena cava without evidence of filling defect or extravasation. Attention was then turned toward PE thrombectomy. In pre close fashion, 2 ProGlides at the 10 o'clock and 2 o'clock positions. Over the wire, a 24 [place] Inari sheath was then placed and directed to the inferior vena cava. Over the wire, a double angle pigtail catheter was inserted and under fluoroscopic guidance was attempted to be directed through the right atrium and right ventricle to the main pulmonary artery, however due to the size of the right heart this was difficult. Therefore, a Swan-Ganz catheter was inserted with the balloon inflated which was floated to the main pulmonary artery. Central  manometry was performed at this location with pulmonary arterial pressure of 70/38 with a mean of 45 mm Hg. The balloon was deflated and over a 0.025 guidewire the Swan-Ganz catheter was exchanged for a 4 French angled tip glide catheter. A Wholey wire was then inserted and directed into the left inferior pulmonary artery. The catheter was advanced to this location. The catheter was then exchanged for a 6 French angled tip select catheter. The wire was exchanged for a short taper superstiff Amplatz wire. The catheter was removed. A 24 [place] Flowtreiver aspiration catheter was then directed under fluoroscopic guidance to the main pulmonary artery. The inner dilator was removed. Pulmonary angiogram was then performed which demonstrated similar filling defects in the main and bilateral pulmonary arteries compatible with acute pulmonary embolism. The aspiration catheter was then advanced to the left pulmonary artery. Multiple aspirations were performed which yielded a large volume of  acute appearing thrombus. Left pulmonary angiogram was then repeated which demonstrated significantly improved patency and perfusion of the left lung. There is a small volume additional thrombus in the proximal left inferior lobar pulmonary artery. Therefore, the T20 curved aspiration catheter was advanced in coaxial fashion and aspiration was performed which yielded additional small volume acute appearing thrombus. The coaxial system was then directed to the right pulmonary artery in the wire was inserted into the right inferior lobar pulmonary artery. Aspiration thrombectomy was then performed which yielded large volume acute appearing thrombus. The large volume thrombus was affixed to the distal tip of the aspiration catheter and required removal of the coaxial aspiration device over the wire and Ree insertion. At this point, wire access to the right pulmonary artery was lost. Given significant improvement in vital signs and large  volume thrombus, the pulmonary artery was not selected for completion measurements. At this point, the patient's tachycardia improved and oxygen saturations improved. The catheters were removed. Repeat inferior vena cavagram was then performed for planning purposes which demonstrated patency and again no evidence of extravasation. Over the wire, an option elite IVC filter was placed in an infrarenal location under fluoroscopic visualization. The sheath was then removed and the ProGlides were tied and cut. There was insufficient hemostasis, therefore a 0 Prolene pursestring stitch was placed about the right groin access site. Hemostasis was achieved at the right neck and a pressure dressing was applied. The patient tolerated the procedure well and was transferred to the ICU in good condition. IMPRESSION: 1. Inferior vena cavogram demonstrates indwelling inferior vena cava filter with possible adherent thrombus. 2. Technically successful inferior vena cava filter retrieval. 3. Successful bilateral pulmonary artery thrombectomy with removal of large volume acute appearing thrombus. 4. Technically successful inferior vena cava filter placement. Ester Sides, MD [facility] Radiology Electronically Signed   By: Ester Sides M.D.   On: 07/13/2024 08:49   IR THROMBECT PRIM MECH ADD (INCLU) MOD SED Result Date: 07/13/2024 INDICATION: [age]-year-old male with history of acute, high risk pulmonary embolism. EXAM: 1. Ultrasound-guided vascular access of the right common femoral vein. 2. Inferior vena cavogram. 3. Ultrasound-guided vascular access of the right internal jugular vein. 4. Inferior vena cava filter retrieval. 5. Pulmonary angiogram. 6. Central manometry. 7. Bilateral pulmonary artery aspiration thrombectomy. 8. Inferior vena cava filter placement. COMPARISON:  CTA chest from earlier the same day MEDICATIONS: 10,000 units heparin , intravenous  ANESTHESIA/SEDATION: Moderate (conscious) sedation was employed during this procedure. A total of Versed  3 mg and Fentanyl  0 mcg was administered intravenously. Moderate Sedation Time: 97 minutes. The patient's level of consciousness and vital signs were monitored continuously by radiology nursing throughout the procedure under my direct supervision. FLUOROSCOPY TIME:  Nine hundred sixteen mGy reference air kerma COMPLICATIONS: None immediate. TECHNIQUE: Informed written consent was obtained from the patient after a thorough discussion of the procedural risks, benefits and alternatives. All questions were addressed. Maximal Sterile Barrier Technique was utilized including caps, mask, sterile gowns, sterile gloves, sterile drape, hand hygiene and skin antiseptic. A timeout was performed prior to the initiation of the procedure. Preprocedure ultrasound evaluation demonstrated patency of the right common femoral vein. The procedure was planned. Subdermal Local anesthesia was administered 1% lidocaine . A small skin nick was made. Under direct ultrasound visualization, the right common femoral vein was accessed with a 21 gauge micropuncture needle. A permanent ultrasound image was captured and stored in the record. Micropuncture sheath was inserted followed by placement of a  Wholey wire was directed to the inferior vena cava under fluoroscopic guidance. An 8 [place] vascular sheath was placed. A pigtail catheter was inserted to the peripheral inferior vena cava and inferior vena cavagram was performed. Inferior vena cavagram was significant for presence of an infrarenal IVC filter with possible nonocclusive thrombus about the apex of the filter with sluggish antegrade flow. Given failure of anticoagulation and indwelling IVC filter with presence of thrombus, decision was made to remove the indwelling filter. Therefore, the right internal jugular vein was accessed with a 21 gauge micropuncture needle. A permanent  ultrasound image was captured and stored in the record. A micropuncture sheath was introduced through which a Wholey wire was advanced to the peripheral inferior vena cava. Serial dilation was performed followed by introduction of an argon triple loop snare IVC filter retrieval coaxial kit. The snare was then used to capture the apex of the filter which was retrieved without complication. Repeat inferior vena cavagram was performed which demonstrated patency of the normal caliber inferior vena cava without evidence of filling defect or extravasation. Attention was then turned toward PE thrombectomy. In pre close fashion, 2 ProGlides at the 10 o'clock and 2 o'clock positions. Over the wire, a 24 [place] Inari sheath was then placed and directed to the inferior vena cava. Over the wire, a double angle pigtail catheter was inserted and under fluoroscopic guidance was attempted to be directed through the right atrium and right ventricle to the main pulmonary artery, however due to the size of the right heart this was difficult. Therefore, a Swan-Ganz catheter was inserted with the balloon inflated which was floated to the main pulmonary artery. Central manometry was performed at this location with pulmonary arterial pressure of 70/38 with a mean of 45 mm Hg. The balloon was deflated and over a 0.025 guidewire the Swan-Ganz catheter was exchanged for a 4 French angled tip glide catheter. A Wholey wire was then inserted and directed into the left inferior pulmonary artery. The catheter was advanced to this location. The catheter was then exchanged for a 6 French angled tip select catheter. The wire was exchanged for a short taper superstiff Amplatz wire. The catheter was removed. A 24 [place] Flowtreiver aspiration catheter was then directed under fluoroscopic guidance to the main pulmonary artery. The inner dilator was removed. Pulmonary angiogram was then performed which demonstrated similar filling defects in the main  and bilateral pulmonary arteries compatible with acute pulmonary embolism. The aspiration catheter was then advanced to the left pulmonary artery. Multiple aspirations were performed which yielded a large volume of acute appearing thrombus. Left pulmonary angiogram was then repeated which demonstrated significantly improved patency and perfusion of the left lung. There is a small volume additional thrombus in the proximal left inferior lobar pulmonary artery. Therefore, the T20 curved aspiration catheter was advanced in coaxial fashion and aspiration was performed which yielded additional small volume acute appearing thrombus. The coaxial system was then directed to the right pulmonary artery in the wire was inserted into the right inferior lobar pulmonary artery. Aspiration thrombectomy was then performed which yielded large volume acute appearing thrombus. The large volume thrombus was affixed to the distal tip of the aspiration catheter and required removal of the coaxial aspiration device over the wire and Ree insertion. At this point, wire access to the right pulmonary artery was lost. Given significant improvement in vital signs and large volume thrombus, the pulmonary artery was not selected for completion measurements. At this point, the patient's tachycardia improved  and oxygen saturations improved. The catheters were removed. Repeat inferior vena cavagram was then performed for planning purposes which demonstrated patency and again no evidence of extravasation. Over the wire, an option elite IVC filter was placed in an infrarenal location under fluoroscopic visualization. The sheath was then removed and the ProGlides were tied and cut. There was insufficient hemostasis, therefore a 0 Prolene pursestring stitch was placed about the right groin access site. Hemostasis was achieved at the right neck and a pressure dressing was applied. The patient tolerated the procedure well and was transferred to the ICU in  good condition. IMPRESSION: 1. Inferior vena cavogram demonstrates indwelling inferior vena cava filter with possible adherent thrombus. 2. Technically successful inferior vena cava filter retrieval. 3. Successful bilateral pulmonary artery thrombectomy with removal of large volume acute appearing thrombus. 4. Technically successful inferior vena cava filter placement. Ester Sides, MD [facility] Radiology Electronically Signed   By: Ester Sides M.D.   On: 07/13/2024 08:49   IR THROMBECT PRIM MECH ADD (INCLU) MOD SED Result Date: 07/13/2024 INDICATION: [age]-year-old male with history of acute, high risk pulmonary embolism. EXAM: 1. Ultrasound-guided vascular access of the right common femoral vein. 2. Inferior vena cavogram. 3. Ultrasound-guided vascular access of the right internal jugular vein. 4. Inferior vena cava filter retrieval. 5. Pulmonary angiogram. 6. Central manometry. 7. Bilateral pulmonary artery aspiration thrombectomy. 8. Inferior vena cava filter placement. COMPARISON:  CTA chest from earlier the same day MEDICATIONS: 10,000 units heparin , intravenous ANESTHESIA/SEDATION: Moderate (conscious) sedation was employed during this procedure. A total of Versed  3 mg and Fentanyl  0 mcg was administered intravenously. Moderate Sedation Time: 97 minutes. The patient's level of consciousness and vital signs were monitored continuously by radiology nursing throughout the procedure under my direct supervision. FLUOROSCOPY TIME:  Nine hundred sixteen mGy reference air kerma COMPLICATIONS: None immediate. TECHNIQUE: Informed written consent was obtained from the patient after a thorough discussion of the procedural risks, benefits and alternatives. All questions were addressed. Maximal Sterile Barrier Technique was utilized including caps, mask, sterile gowns, sterile gloves, sterile drape, hand hygiene and skin antiseptic. A timeout was performed prior  to the initiation of the procedure. Preprocedure ultrasound evaluation demonstrated patency of the right common femoral vein. The procedure was planned. Subdermal Local anesthesia was administered 1% lidocaine . A small skin nick was made. Under direct ultrasound visualization, the right common femoral vein was accessed with a 21 gauge micropuncture needle. A permanent ultrasound image was captured and stored in the record. Micropuncture sheath was inserted followed by placement of a Wholey wire was directed to the inferior vena cava under fluoroscopic guidance. An 8 [place] vascular sheath was placed. A pigtail catheter was inserted to the peripheral inferior vena cava and inferior vena cavagram was performed. Inferior vena cavagram was significant for presence of an infrarenal IVC filter with possible nonocclusive thrombus about the apex of the filter with sluggish antegrade flow. Given failure of anticoagulation and indwelling IVC filter with presence of thrombus, decision was made to remove the indwelling filter. Therefore, the right internal jugular vein was accessed with a 21 gauge micropuncture needle. A permanent ultrasound image was captured and stored in the record. A micropuncture sheath was introduced through which a Wholey wire was advanced to the peripheral inferior vena cava. Serial dilation was performed followed by introduction of an argon triple loop snare IVC filter retrieval coaxial kit. The snare was then used to capture the apex of the filter which was retrieved without  complication. Repeat inferior vena cavagram was performed which demonstrated patency of the normal caliber inferior vena cava without evidence of filling defect or extravasation. Attention was then turned toward PE thrombectomy. In pre close fashion, 2 ProGlides at the 10 o'clock and 2 o'clock positions. Over the wire, a 24 [place] Inari sheath was then placed and directed to the inferior vena cava. Over the wire, a double angle  pigtail catheter was inserted and under fluoroscopic guidance was attempted to be directed through the right atrium and right ventricle to the main pulmonary artery, however due to the size of the right heart this was difficult. Therefore, a Swan-Ganz catheter was inserted with the balloon inflated which was floated to the main pulmonary artery. Central manometry was performed at this location with pulmonary arterial pressure of 70/38 with a mean of 45 mm Hg. The balloon was deflated and over a 0.025 guidewire the Swan-Ganz catheter was exchanged for a 4 French angled tip glide catheter. A Wholey wire was then inserted and directed into the left inferior pulmonary artery. The catheter was advanced to this location. The catheter was then exchanged for a 6 French angled tip select catheter. The wire was exchanged for a short taper superstiff Amplatz wire. The catheter was removed. A 24 [place] Flowtreiver aspiration catheter was then directed under fluoroscopic guidance to the main pulmonary artery. The inner dilator was removed. Pulmonary angiogram was then performed which demonstrated similar filling defects in the main and bilateral pulmonary arteries compatible with acute pulmonary embolism. The aspiration catheter was then advanced to the left pulmonary artery. Multiple aspirations were performed which yielded a large volume of acute appearing thrombus. Left pulmonary angiogram was then repeated which demonstrated significantly improved patency and perfusion of the left lung. There is a small volume additional thrombus in the proximal left inferior lobar pulmonary artery. Therefore, the T20 curved aspiration catheter was advanced in coaxial fashion and aspiration was performed which yielded additional small volume acute appearing thrombus. The coaxial system was then directed to the right pulmonary artery in the wire was inserted into the right inferior lobar pulmonary artery. Aspiration thrombectomy was then  performed which yielded large volume acute appearing thrombus. The large volume thrombus was affixed to the distal tip of the aspiration catheter and required removal of the coaxial aspiration device over the wire and Ree insertion. At this point, wire access to the right pulmonary artery was lost. Given significant improvement in vital signs and large volume thrombus, the pulmonary artery was not selected for completion measurements. At this point, the patient's tachycardia improved and oxygen saturations improved. The catheters were removed. Repeat inferior vena cavagram was then performed for planning purposes which demonstrated patency and again no evidence of extravasation. Over the wire, an option elite IVC filter was placed in an infrarenal location under fluoroscopic visualization. The sheath was then removed and the ProGlides were tied and cut. There was insufficient hemostasis, therefore a 0 Prolene pursestring stitch was placed about the right groin access site. Hemostasis was achieved at the right neck and a pressure dressing was applied. The patient tolerated the procedure well and was transferred to the ICU in good condition. IMPRESSION: 1. Inferior vena cavogram demonstrates indwelling inferior vena cava filter with possible adherent thrombus. 2. Technically successful inferior vena cava filter retrieval. 3. Successful bilateral pulmonary artery thrombectomy with removal of large volume acute appearing thrombus. 4. Technically successful inferior vena cava filter placement. Ester Sides, MD [facility] Radiology Electronically Signed  By: Ester Sides M.D.   On: 07/13/2024 08:49   IR Angiogram Pulmonary Bilateral Selective Result Date: 07/13/2024 INDICATION: [age]-year-old male with history of acute, high risk pulmonary embolism. EXAM: 1. Ultrasound-guided vascular access of the right common femoral vein. 2. Inferior vena cavogram. 3.  Ultrasound-guided vascular access of the right internal jugular vein. 4. Inferior vena cava filter retrieval. 5. Pulmonary angiogram. 6. Central manometry. 7. Bilateral pulmonary artery aspiration thrombectomy. 8. Inferior vena cava filter placement. COMPARISON:  CTA chest from earlier the same day MEDICATIONS: 10,000 units heparin , intravenous ANESTHESIA/SEDATION: Moderate (conscious) sedation was employed during this procedure. A total of Versed  3 mg and Fentanyl  0 mcg was administered intravenously. Moderate Sedation Time: 97 minutes. The patient's level of consciousness and vital signs were monitored continuously by radiology nursing throughout the procedure under my direct supervision. FLUOROSCOPY TIME:  Nine hundred sixteen mGy reference air kerma COMPLICATIONS: None immediate. TECHNIQUE: Informed written consent was obtained from the patient after a thorough discussion of the procedural risks, benefits and alternatives. All questions were addressed. Maximal Sterile Barrier Technique was utilized including caps, mask, sterile gowns, sterile gloves, sterile drape, hand hygiene and skin antiseptic. A timeout was performed prior to the initiation of the procedure. Preprocedure ultrasound evaluation demonstrated patency of the right common femoral vein. The procedure was planned. Subdermal Local anesthesia was administered 1% lidocaine . A small skin nick was made. Under direct ultrasound visualization, the right common femoral vein was accessed with a 21 gauge micropuncture needle. A permanent ultrasound image was captured and stored in the record. Micropuncture sheath was inserted followed by placement of a Wholey wire was directed to the inferior vena cava under fluoroscopic guidance. An 8 [place] vascular sheath was placed. A pigtail catheter was inserted to the peripheral inferior vena cava and inferior vena cavagram was performed. Inferior vena cavagram was significant for presence of an infrarenal IVC filter  with possible nonocclusive thrombus about the apex of the filter with sluggish antegrade flow. Given failure of anticoagulation and indwelling IVC filter with presence of thrombus, decision was made to remove the indwelling filter. Therefore, the right internal jugular vein was accessed with a 21 gauge micropuncture needle. A permanent ultrasound image was captured and stored in the record. A micropuncture sheath was introduced through which a Wholey wire was advanced to the peripheral inferior vena cava. Serial dilation was performed followed by introduction of an argon triple loop snare IVC filter retrieval coaxial kit. The snare was then used to capture the apex of the filter which was retrieved without complication. Repeat inferior vena cavagram was performed which demonstrated patency of the normal caliber inferior vena cava without evidence of filling defect or extravasation. Attention was then turned toward PE thrombectomy. In pre close fashion, 2 ProGlides at the 10 o'clock and 2 o'clock positions. Over the wire, a 24 [place] Inari sheath was then placed and directed to the inferior vena cava. Over the wire, a double angle pigtail catheter was inserted and under fluoroscopic guidance was attempted to be directed through the right atrium and right ventricle to the main pulmonary artery, however due to the size of the right heart this was difficult. Therefore, a Swan-Ganz catheter was inserted with the balloon inflated which was floated to the main pulmonary artery. Central manometry was performed at this location with pulmonary arterial pressure of 70/38 with a mean of 45 mm Hg. The balloon was deflated and over a 0.025 guidewire the Swan-Ganz catheter was exchanged for a 4 French angled tip  glide catheter. A Wholey wire was then inserted and directed into the left inferior pulmonary artery. The catheter was advanced to this location. The catheter was then exchanged for a 6 French angled tip select catheter.  The wire was exchanged for a short taper superstiff Amplatz wire. The catheter was removed. A 24 [place] Flowtreiver aspiration catheter was then directed under fluoroscopic guidance to the main pulmonary artery. The inner dilator was removed. Pulmonary angiogram was then performed which demonstrated similar filling defects in the main and bilateral pulmonary arteries compatible with acute pulmonary embolism. The aspiration catheter was then advanced to the left pulmonary artery. Multiple aspirations were performed which yielded a large volume of acute appearing thrombus. Left pulmonary angiogram was then repeated which demonstrated significantly improved patency and perfusion of the left lung. There is a small volume additional thrombus in the proximal left inferior lobar pulmonary artery. Therefore, the T20 curved aspiration catheter was advanced in coaxial fashion and aspiration was performed which yielded additional small volume acute appearing thrombus. The coaxial system was then directed to the right pulmonary artery in the wire was inserted into the right inferior lobar pulmonary artery. Aspiration thrombectomy was then performed which yielded large volume acute appearing thrombus. The large volume thrombus was affixed to the distal tip of the aspiration catheter and required removal of the coaxial aspiration device over the wire and Ree insertion. At this point, wire access to the right pulmonary artery was lost. Given significant improvement in vital signs and large volume thrombus, the pulmonary artery was not selected for completion measurements. At this point, the patient's tachycardia improved and oxygen saturations improved. The catheters were removed. Repeat inferior vena cavagram was then performed for planning purposes which demonstrated patency and again no evidence of extravasation. Over the wire, an option elite IVC filter was placed in an infrarenal location under fluoroscopic visualization. The  sheath was then removed and the ProGlides were tied and cut. There was insufficient hemostasis, therefore a 0 Prolene pursestring stitch was placed about the right groin access site. Hemostasis was achieved at the right neck and a pressure dressing was applied. The patient tolerated the procedure well and was transferred to the ICU in good condition. IMPRESSION: 1. Inferior vena cavogram demonstrates indwelling inferior vena cava filter with possible adherent thrombus. 2. Technically successful inferior vena cava filter retrieval. 3. Successful bilateral pulmonary artery thrombectomy with removal of large volume acute appearing thrombus. 4. Technically successful inferior vena cava filter placement. Ester Sides, MD [facility] Radiology Electronically Signed   By: Ester Sides M.D.   On: 07/13/2024 08:49   IR Angiogram Selective Each Additional Vessel Result Date: 07/13/2024 INDICATION: [age]-year-old male with history of acute, high risk pulmonary embolism. EXAM: 1. Ultrasound-guided vascular access of the right common femoral vein. 2. Inferior vena cavogram. 3. Ultrasound-guided vascular access of the right internal jugular vein. 4. Inferior vena cava filter retrieval. 5. Pulmonary angiogram. 6. Central manometry. 7. Bilateral pulmonary artery aspiration thrombectomy. 8. Inferior vena cava filter placement. COMPARISON:  CTA chest from earlier the same day MEDICATIONS: 10,000 units heparin , intravenous ANESTHESIA/SEDATION: Moderate (conscious) sedation was employed during this procedure. A total of Versed  3 mg and Fentanyl  0 mcg was administered intravenously. Moderate Sedation Time: 97 minutes. The patient's level of consciousness and vital signs were monitored continuously by radiology nursing throughout the procedure under my direct supervision. FLUOROSCOPY TIME:  Nine hundred sixteen mGy reference air kerma COMPLICATIONS: None immediate. TECHNIQUE:  Informed written consent was  obtained from the patient after a thorough discussion of the procedural risks, benefits and alternatives. All questions were addressed. Maximal Sterile Barrier Technique was utilized including caps, mask, sterile gowns, sterile gloves, sterile drape, hand hygiene and skin antiseptic. A timeout was performed prior to the initiation of the procedure. Preprocedure ultrasound evaluation demonstrated patency of the right common femoral vein. The procedure was planned. Subdermal Local anesthesia was administered 1% lidocaine . A small skin nick was made. Under direct ultrasound visualization, the right common femoral vein was accessed with a 21 gauge micropuncture needle. A permanent ultrasound image was captured and stored in the record. Micropuncture sheath was inserted followed by placement of a Wholey wire was directed to the inferior vena cava under fluoroscopic guidance. An 8 [place] vascular sheath was placed. A pigtail catheter was inserted to the peripheral inferior vena cava and inferior vena cavagram was performed. Inferior vena cavagram was significant for presence of an infrarenal IVC filter with possible nonocclusive thrombus about the apex of the filter with sluggish antegrade flow. Given failure of anticoagulation and indwelling IVC filter with presence of thrombus, decision was made to remove the indwelling filter. Therefore, the right internal jugular vein was accessed with a 21 gauge micropuncture needle. A permanent ultrasound image was captured and stored in the record. A micropuncture sheath was introduced through which a Wholey wire was advanced to the peripheral inferior vena cava. Serial dilation was performed followed by introduction of an argon triple loop snare IVC filter retrieval coaxial kit. The snare was then used to capture the apex of the filter which was retrieved without complication. Repeat inferior vena cavagram was performed which demonstrated patency of  the normal caliber inferior vena cava without evidence of filling defect or extravasation. Attention was then turned toward PE thrombectomy. In pre close fashion, 2 ProGlides at the 10 o'clock and 2 o'clock positions. Over the wire, a 24 [place] Inari sheath was then placed and directed to the inferior vena cava. Over the wire, a double angle pigtail catheter was inserted and under fluoroscopic guidance was attempted to be directed through the right atrium and right ventricle to the main pulmonary artery, however due to the size of the right heart this was difficult. Therefore, a Swan-Ganz catheter was inserted with the balloon inflated which was floated to the main pulmonary artery. Central manometry was performed at this location with pulmonary arterial pressure of 70/38 with a mean of 45 mm Hg. The balloon was deflated and over a 0.025 guidewire the Swan-Ganz catheter was exchanged for a 4 French angled tip glide catheter. A Wholey wire was then inserted and directed into the left inferior pulmonary artery. The catheter was advanced to this location. The catheter was then exchanged for a 6 French angled tip select catheter. The wire was exchanged for a short taper superstiff Amplatz wire. The catheter was removed. A 24 [place] Flowtreiver aspiration catheter was then directed under fluoroscopic guidance to the main pulmonary artery. The inner dilator was removed. Pulmonary angiogram was then performed which demonstrated similar filling defects in the main and bilateral pulmonary arteries compatible with acute pulmonary embolism. The aspiration catheter was then advanced to the left pulmonary artery. Multiple aspirations were performed which yielded a large volume of acute appearing thrombus. Left pulmonary angiogram was then repeated which demonstrated significantly improved patency and perfusion of the left lung. There is a small volume additional thrombus in the proximal left inferior lobar pulmonary artery.  Therefore, the T20 curved aspiration catheter was advanced in  coaxial fashion and aspiration was performed which yielded additional small volume acute appearing thrombus. The coaxial system was then directed to the right pulmonary artery in the wire was inserted into the right inferior lobar pulmonary artery. Aspiration thrombectomy was then performed which yielded large volume acute appearing thrombus. The large volume thrombus was affixed to the distal tip of the aspiration catheter and required removal of the coaxial aspiration device over the wire and Ree insertion. At this point, wire access to the right pulmonary artery was lost. Given significant improvement in vital signs and large volume thrombus, the pulmonary artery was not selected for completion measurements. At this point, the patient's tachycardia improved and oxygen saturations improved. The catheters were removed. Repeat inferior vena cavagram was then performed for planning purposes which demonstrated patency and again no evidence of extravasation. Over the wire, an option elite IVC filter was placed in an infrarenal location under fluoroscopic visualization. The sheath was then removed and the ProGlides were tied and cut. There was insufficient hemostasis, therefore a 0 Prolene pursestring stitch was placed about the right groin access site. Hemostasis was achieved at the right neck and a pressure dressing was applied. The patient tolerated the procedure well and was transferred to the ICU in good condition. IMPRESSION: 1. Inferior vena cavogram demonstrates indwelling inferior vena cava filter with possible adherent thrombus. 2. Technically successful inferior vena cava filter retrieval. 3. Successful bilateral pulmonary artery thrombectomy with removal of large volume acute appearing thrombus. 4. Technically successful inferior vena cava filter placement. Ester Sides, MD [facility] Radiology  Electronically Signed   By: Ester Sides M.D.   On: 07/13/2024 08:49   IR Angiogram Selective Each Additional Vessel Result Date: 07/13/2024 INDICATION: [age]-year-old male with history of acute, high risk pulmonary embolism. EXAM: 1. Ultrasound-guided vascular access of the right common femoral vein. 2. Inferior vena cavogram. 3. Ultrasound-guided vascular access of the right internal jugular vein. 4. Inferior vena cava filter retrieval. 5. Pulmonary angiogram. 6. Central manometry. 7. Bilateral pulmonary artery aspiration thrombectomy. 8. Inferior vena cava filter placement. COMPARISON:  CTA chest from earlier the same day MEDICATIONS: 10,000 units heparin , intravenous ANESTHESIA/SEDATION: Moderate (conscious) sedation was employed during this procedure. A total of Versed  3 mg and Fentanyl  0 mcg was administered intravenously. Moderate Sedation Time: 97 minutes. The patient's level of consciousness and vital signs were monitored continuously by radiology nursing throughout the procedure under my direct supervision. FLUOROSCOPY TIME:  Nine hundred sixteen mGy reference air kerma COMPLICATIONS: None immediate. TECHNIQUE: Informed written consent was obtained from the patient after a thorough discussion of the procedural risks, benefits and alternatives. All questions were addressed. Maximal Sterile Barrier Technique was utilized including caps, mask, sterile gowns, sterile gloves, sterile drape, hand hygiene and skin antiseptic. A timeout was performed prior to the initiation of the procedure. Preprocedure ultrasound evaluation demonstrated patency of the right common femoral vein. The procedure was planned. Subdermal Local anesthesia was administered 1% lidocaine . A small skin nick was made. Under direct ultrasound visualization, the right common femoral vein was accessed with a 21 gauge micropuncture needle. A permanent ultrasound image was captured and stored in the record. Micropuncture sheath was  inserted followed by placement of a Wholey wire was directed to the inferior vena cava under fluoroscopic guidance. An 8 [place] vascular sheath was placed. A pigtail catheter was inserted to the peripheral inferior vena cava and inferior vena cavagram was performed. Inferior vena cavagram was significant for presence of an infrarenal  IVC filter with possible nonocclusive thrombus about the apex of the filter with sluggish antegrade flow. Given failure of anticoagulation and indwelling IVC filter with presence of thrombus, decision was made to remove the indwelling filter. Therefore, the right internal jugular vein was accessed with a 21 gauge micropuncture needle. A permanent ultrasound image was captured and stored in the record. A micropuncture sheath was introduced through which a Wholey wire was advanced to the peripheral inferior vena cava. Serial dilation was performed followed by introduction of an argon triple loop snare IVC filter retrieval coaxial kit. The snare was then used to capture the apex of the filter which was retrieved without complication. Repeat inferior vena cavagram was performed which demonstrated patency of the normal caliber inferior vena cava without evidence of filling defect or extravasation. Attention was then turned toward PE thrombectomy. In pre close fashion, 2 ProGlides at the 10 o'clock and 2 o'clock positions. Over the wire, a 24 [place] Inari sheath was then placed and directed to the inferior vena cava. Over the wire, a double angle pigtail catheter was inserted and under fluoroscopic guidance was attempted to be directed through the right atrium and right ventricle to the main pulmonary artery, however due to the size of the right heart this was difficult. Therefore, a Swan-Ganz catheter was inserted with the balloon inflated which was floated to the main pulmonary artery. Central manometry was performed at this location with pulmonary arterial pressure of 70/38 with a mean of  45 mm Hg. The balloon was deflated and over a 0.025 guidewire the Swan-Ganz catheter was exchanged for a 4 French angled tip glide catheter. A Wholey wire was then inserted and directed into the left inferior pulmonary artery. The catheter was advanced to this location. The catheter was then exchanged for a 6 French angled tip select catheter. The wire was exchanged for a short taper superstiff Amplatz wire. The catheter was removed. A 24 [place] Flowtreiver aspiration catheter was then directed under fluoroscopic guidance to the main pulmonary artery. The inner dilator was removed. Pulmonary angiogram was then performed which demonstrated similar filling defects in the main and bilateral pulmonary arteries compatible with acute pulmonary embolism. The aspiration catheter was then advanced to the left pulmonary artery. Multiple aspirations were performed which yielded a large volume of acute appearing thrombus. Left pulmonary angiogram was then repeated which demonstrated significantly improved patency and perfusion of the left lung. There is a small volume additional thrombus in the proximal left inferior lobar pulmonary artery. Therefore, the T20 curved aspiration catheter was advanced in coaxial fashion and aspiration was performed which yielded additional small volume acute appearing thrombus. The coaxial system was then directed to the right pulmonary artery in the wire was inserted into the right inferior lobar pulmonary artery. Aspiration thrombectomy was then performed which yielded large volume acute appearing thrombus. The large volume thrombus was affixed to the distal tip of the aspiration catheter and required removal of the coaxial aspiration device over the wire and Ree insertion. At this point, wire access to the right pulmonary artery was lost. Given significant improvement in vital signs and large volume thrombus, the pulmonary artery was not selected for completion measurements. At this point, the  patient's tachycardia improved and oxygen saturations improved. The catheters were removed. Repeat inferior vena cavagram was then performed for planning purposes which demonstrated patency and again no evidence of extravasation. Over the wire, an option elite IVC filter was placed in an infrarenal location under fluoroscopic visualization. The sheath  was then removed and the ProGlides were tied and cut. There was insufficient hemostasis, therefore a 0 Prolene pursestring stitch was placed about the right groin access site. Hemostasis was achieved at the right neck and a pressure dressing was applied. The patient tolerated the procedure well and was transferred to the ICU in good condition. IMPRESSION: 1. Inferior vena cavogram demonstrates indwelling inferior vena cava filter with possible adherent thrombus. 2. Technically successful inferior vena cava filter retrieval. 3. Successful bilateral pulmonary artery thrombectomy with removal of large volume acute appearing thrombus. 4. Technically successful inferior vena cava filter placement. Ester Sides, MD [facility] Radiology Electronically Signed   By: Ester Sides M.D.   On: 07/13/2024 08:49   CT CERVICAL SPINE WO CONTRAST Result Date: 07/12/2024 CLINICAL DATA:  Blunt poly trauma. EXAM: CT CERVICAL SPINE WITHOUT CONTRAST TECHNIQUE: Multidetector CT imaging of the cervical spine was performed without intravenous contrast. Multiplanar CT image reconstructions were also generated. RADIATION DOSE REDUCTION: This exam was performed according to the departmental dose-optimization program which includes automated exposure control, adjustment of the mA and/or kV according to patient size and/or use of iterative reconstruction technique. COMPARISON:  None Available. FINDINGS: Alignment: Straightening of normal lordosis. No traumatic subluxation. Skull base and vertebrae: No acute fracture. Vertebral body heights are  maintained. The dens and skull base are intact. Soft tissues and spinal canal: No prevertebral fluid or swelling. No visible canal hematoma. Disc levels: Anterior and posterior spurring extend from C4-C5 through a C7-T1. Mild multilevel facet hypertrophy. Spinal canal narrowing at C4-C5 and C6-C7. Upper chest: Assessed on concurrent chest CT, reported separately. Other: None. IMPRESSION: Degenerative change in the cervical spine without acute fracture or subluxation. Electronically Signed   By: Andrea Gasman M.D.   On: 07/12/2024 18:48   CT Angio Chest/Abd/Pel for Dissection W and/or Wo Contrast Result Date: 07/12/2024 CLINICAL DATA:  Provided history: Acute aortic syndrome (AAS) suspected Found unresponsive on floor.  Hypotensive. EXAM: CT ANGIOGRAPHY CHEST, ABDOMEN AND PELVIS TECHNIQUE: Non-contrast CT of the chest was initially obtained. Multidetector CT imaging through the chest, abdomen and pelvis was performed using the standard protocol during bolus administration of intravenous contrast. Multiplanar reconstructed images and MIPs were obtained and reviewed to evaluate the vascular anatomy. RADIATION DOSE REDUCTION: This exam was performed according to the departmental dose-optimization program which includes automated exposure control, adjustment of the mA and/or kV according to patient size and/or use of iterative reconstruction technique. CONTRAST:  OMNIPAQUE  IOHEXOL  350 MG/ML SOLN COMPARISON:  Chest CTA 07/07/2024, abdominopelvic venogram 05/21/2021 FINDINGS: CTA CHEST FINDINGS Cardiovascular: No aortic hematoma on noncontrast exam. No dissection or evidence of acute aortic injury. Mild aortic atherosclerosis. Acute bilateral pulmonary emboli that are new from prior exam. Small saddle embolus is new, with large clot burden in the distal main pulmonary arteries extending into many lobar and segmental branches. 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 There is right heart strain with RV to LV ratio of 2. The heart is upper normal in size. There is no pericardial effusion. Mediastinum/Nodes: No mediastinal hemorrhage or hematoma. Small mediastinal lymph nodes are not enlarged by size criteria. Decompressed esophagus. No pneumomediastinum. No visible thyroid  nodule. Lungs/Pleura: No pneumothorax or pulmonary contusion. Mild heterogeneous pulmonary parenchyma. Scattered areas of scarring and subpleural reticulation, unchanged from recent exam. No evidence of pulmonary infarct or focal airspace disease. No pleural fluid. Trachea and central airways are clear. Musculoskeletal: No  acute fracture of the ribs, sternum, included clavicles or shoulder girdles. Diffuse degenerative change in the thoracic spine without thoracic spine fracture. Partial fusion of the posterior elements of T12-L1. No confluent body wall contusion. Review of the MIP images confirms the above findings. CTA ABDOMEN AND PELVIS FINDINGS VASCULAR Aorta: No aortic aneurysm or dissection. There is periaortic and retroperitoneal stranding at the L2 level, also at the level of IVC filter, but no discrete aortic injury. No aortic irregularity, aortic contours are smooth. Mild aortic atherosclerosis. Celiac: Patent without evidence of aneurysm, dissection, vasculitis or significant stenosis. SMA: Patent without evidence of aneurysm, dissection, vasculitis or significant stenosis. Renals: Both renal arteries are patent without evidence of aneurysm, dissection, vasculitis, fibromuscular dysplasia or significant stenosis. IMA: Patent without evidence of aneurysm, dissection, vasculitis or significant stenosis. Inflow: Patent without evidence of aneurysm, dissection, vasculitis or significant stenosis. Veins: The venous structures are not well assessed on this arterial phase exam. IVC filter in place. There is retroperitoneal stranding and edema at the level of the filter. Review  of the MIP images confirms the above findings. NON-VASCULAR Hepatobiliary: Arterial phase imaging limits detailed assessment. The liver is enlarged with steatosis. There is no evidence of perihepatic hematoma or focal liver abnormality. No evidence of liver injury. Unremarkable gallbladder. Pancreas: No evidence of pancreatic injury. No ductal dilatation or inflammation. Spleen: No perisplenic hematoma.  Normal arterial phase enhancement. Adrenals/Urinary Tract: No adrenal hemorrhage. No evidence of renal injury. No hydronephrosis. Nondistended urinary bladder. Stomach/Bowel: No evidence of bowel injury or inflammation. No bowel wall thickening. Small to moderate colonic stool burden. Occasional colonic diverticula without diverticulitis. Lymphatic: No adenopathy. Reproductive: Prostate is unremarkable. Other: Retroperitoneal stranding adjacent to the aorta and IVC at the L2 level at the level of IVC filter. There is no other free fluid. No free air. Moderate fat containing umbilical hernia. No confluent body wall contusion. Musculoskeletal: No acute fracture of the pelvis or lumbar spine. Diffuse lumbar degenerative change. Review of the MIP images confirms the above findings. IMPRESSION: 1. Acute bilateral pulmonary emboli with large clot burden and right heart strain, (RV/LV Ratio = 2) consistent with at least submassive (intermediate risk) PE. The presence of right heart strain has been associated with an increased risk of morbidity and mortality. Please refer to the Code PE Focused order set in EPIC. 2. Retroperitoneal stranding adjacent to the abdominal aorta and IVC, at the level of IVC filter. There is no evidence of discrete abdominal aortic injury, however stranding etiology is indeterminate in etiology. The IVC is not well assessed on this venous phase exam. Consider a follow-up exam including venous phase imaging. 3. IVC filter in place. 4. No other evidence of acute traumatic injury to the chest,  abdomen, or pelvis. 5. Hepatomegaly and hepatic steatosis. Aortic Atherosclerosis (ICD10-I70.0). Critical Value/emergent results were called by telephone at the time of interpretation on 07/12/2024 at 6:27 pm to Dr Lyndel, who verbally acknowledged these results. Electronically Signed   By: Andrea Gasman M.D.   On: 07/12/2024 18:46   CT HEAD WO CONTRAST Result Date: 07/12/2024 CLINICAL DATA:  Head trauma, found on floor with hematoma to back of head. Hypotensive. EXAM: CT HEAD WITHOUT CONTRAST TECHNIQUE: Contiguous axial images were obtained from the base of the skull through the vertex without intravenous contrast. RADIATION DOSE REDUCTION: This exam was performed according to the departmental dose-optimization program which includes automated exposure control, adjustment of the mA and/or kV according to patient size and/or use of iterative reconstruction technique. COMPARISON:  None Available. FINDINGS: Brain: No intracranial hemorrhage, mass effect, or midline shift. No hydrocephalus. The basilar cisterns are patent. No evidence of territorial infarct or acute ischemia. No extra-axial or intracranial fluid collection. Vascular: Atherosclerosis of skullbase vasculature without hyperdense vessel or abnormal calcification. Skull: No fracture or focal lesion. Sinuses/Orbits: No acute fracture or acute finding. Other: Mild right parietal scalp contusion. IMPRESSION: Mild right parietal scalp contusion. No acute intracranial abnormality. No skull fracture. Electronically Signed   By: Andrea Gasman M.D.   On: 07/12/2024 18:34    Lab Results: Personally reviewed CBC    Component Value Date/Time   WBC 3.7 (L) 08/03/2024 1358   WBC 4.3 07/21/2024 0413   RBC 3.96 (L) 08/03/2024 1358   HGB 12.2 (L) 08/03/2024 1358   HCT 39.2 08/03/2024 1358   PLT 159 08/03/2024 1358   MCV 99.0 08/03/2024 1358   MCH 30.8 08/03/2024 1358   MCHC 31.1 08/03/2024 1358   RDW 17.3 (H) 08/03/2024 1358   LYMPHSABS 1.0  08/03/2024 1358   MONOABS 0.3 08/03/2024 1358   EOSABS 0.2 08/03/2024 1358   BASOSABS 0.0 08/03/2024 1358    BMET    Component Value Date/Time   NA 138 08/03/2024 1358   K 4.3 08/03/2024 1358   CL 105 08/03/2024 1358   CO2 20 (L) 08/03/2024 1358   GLUCOSE 143 (H) 08/03/2024 1358   BUN 29 (H) 08/03/2024 1358   CREATININE 1.06 08/03/2024 1358   CALCIUM  9.0 08/03/2024 1358   GFRNONAA >60 08/03/2024 1358   GFRAA 52 (L) 03/06/2016 1335    BNP    Component Value Date/Time   BNP 12.0 07/12/2024 1858    ProBNP    Component Value Date/Time   PROBNP 578.6 (H) 02/25/2014 0108    Specialty Problems       Pulmonary Problems   Acute respiratory failure with hypoxia (HCC)    Allergies  Allergen Reactions   Penicillins Hives    Has patient had a PCN reaction causing immediate rash, facial/tongue/throat swelling, SOB or lightheadedness with hypotension: No Has patient had a PCN reaction causing severe rash involving mucus membranes or skin necrosis: No Has patient had a PCN reaction that required hospitalization No Has patient had a PCN reaction occurring within the last 10 years: No If all of the above answers are NO, then may proceed with Cephalosporin use.    Immunization History  Administered Date(s) Administered   Influenza-Unspecified 09/10/2023    Past Medical History:  Diagnosis Date   Anemia    Carpal tunnel syndrome    Deep vein thrombosis (HCC) 07/2013, 10/2014, 03/16/2015   LLE (studies at [facility])   Depression    Diabetes mellitus    Hyperlipidemia    Hypertension    Obesity    Osteoarthritis    Peripheral vascular disease (HCC)    6 yrs ago, DVT in Lt knee/ groin   Pulmonary embolism (HCC)    hx of x 3 per patient; most recent 02/2014   Sinusitis    Sleep apnea     Tobacco History: Social History   Tobacco Use  Smoking Status Never  Smokeless Tobacco Never   Counseling given: Not Answered   Continue to not smoke  Outpatient Encounter  Medications as of 08/11/2024  Medication Sig   allopurinol (ZYLOPRIM) 100 MG tablet Take 100 mg by mouth daily.   atorvastatin  (LIPITOR) 80 MG tablet Take 80 mg by mouth daily.   Capsicum, Cayenne, 455 MG CAPS Take 2 tablets by mouth  in the morning and at bedtime.   clindamycin (CLEOCIN) 300 MG capsule Take 300 mg by mouth 2 (two) times daily.   co-enzyme Q-10 30 MG capsule Take 30 mg by mouth daily.   ezetimibe  (ZETIA ) 10 MG tablet Take 10 mg by mouth daily.   Febuxostat 80 MG TABS Take 1 tablet by mouth daily.   furosemide  (LASIX ) 40 MG tablet Take 40 mg by mouth every morning.    L-ARGININE PO Take 1 tablet by mouth daily.   lisinopril  (PRINIVIL ,ZESTRIL ) 2.5 MG tablet Take 2.5 mg by mouth daily.   Magnesium 200 MG CHEW Chew 2 capsules by mouth in the morning and at bedtime.   metoprolol  succinate (TOPROL -XL) 50 MG 24 hr tablet Take 50 mg by mouth at bedtime. Take with or immediately following a meal.   omega-3 acid ethyl esters (LOVAZA) 1 g capsule Take 1 g by mouth daily.   potassium chloride  (K-DUR) 10 MEQ tablet Take 10 mEq by mouth at bedtime.    SYNJARDY XR 12.04-999 MG TB24 Take 1 tablet by mouth 2 (two) times daily.   tamsulosin  (FLOMAX ) 0.4 MG CAPS capsule Take 0.4 mg by mouth daily after breakfast.   topiramate  (TOPAMAX ) 50 MG tablet Take 50 mg by mouth 2 (two) times daily.   TRESIBA FLEXTOUCH 200 UNIT/ML SOPN Inject 26 Units into the skin at bedtime.   TRULICITY 3 MG/0.5ML SOPN Inject 3 mg into the skin once a week.   warfarin (COUMADIN ) 4 MG tablet Take 2 tablets (8 mg total) by mouth daily.   No facility-administered encounter medications on file as of 08/11/2024.     Review of Systems  Review of Systems  No chest pain no orthopnea or PND comprehensive review of systems otherwise negative Physical Exam  BP 122/77   Pulse 90   Temp 97.9 F (36.6 C) (Oral)   Ht 5' 6 (1.676 m)   Wt 240 lb (108.9 kg)   SpO2 96%   BMI 38.74 kg/m   Wt Readings from Last 5 Encounters:   08/11/24 240 lb (108.9 kg)  08/03/24 244 lb 0.6 oz (110.7 kg)  07/21/24 260 lb 5.8 oz (118.1 kg)  07/07/24 251 lb (113.9 kg)  03/03/24 252 lb 6.4 oz (114.5 kg)    BMI Readings from Last 5 Encounters:  08/11/24 38.74 kg/m  08/03/24 39.09 kg/m  07/21/24 42.02 kg/m  07/07/24 40.51 kg/m  03/03/24 39.53 kg/m     Physical Exam General: Sitting in chair, no acute distress Eyes: EOMI, icterus Neck: Supple, no JVP Pulmonary: Distant, normal work of breathing, clear Cardiovascular: Regular rate and rhythm, no murmur Abdomen: Nondistended.  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  07/2024 recurrence with INR level 1.3.  He states PCP is managing this.  He states he has upcoming appoint with hematology.  Will repeat CTA and echocardiogram at 44-month interval.  Likely will need referral to [facility] if there is ongoing abnormalities.  Cough: Comes and goes.  Productive sputum.  Air trapping on recent PFTs.  Suspect possible element of cough variant asthma.  Symptom burden is low does not warrant treatment at this time.  Consider ICS/LABA therapy in the future if worsens.  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  Repeat TTE 10/2024 and referred to [facility] if recurrent or ongoing RV dysfunction.   Return in about 3 months (around 11/10/2024) for f/u Dr. Annella, after CT scan.   Donnice JONELLE Annella, MD 08/11/2024   This appointment required 42 minutes of patient care (this includes precharting, chart review, review of results, face-to-face care, etc.).

## 2024-08-11 NOTE — Telephone Encounter (Signed)
 Left message for patient to call back.  Looks like oncology wanted him to see GI again for new onset anemia, low platelets, history transaminitis.  He is currently scheduled to see Travis Blower, PA-C on 10/11/24.

## 2024-08-12 ENCOUNTER — Ambulatory Visit: Admitting: Podiatry

## 2024-08-12 VITALS — Ht 66.0 in | Wt 240.0 lb

## 2024-08-12 DIAGNOSIS — M79672 Pain in left foot: Secondary | ICD-10-CM | POA: Diagnosis not present

## 2024-08-12 DIAGNOSIS — B351 Tinea unguium: Secondary | ICD-10-CM

## 2024-08-12 DIAGNOSIS — M79671 Pain in right foot: Secondary | ICD-10-CM

## 2024-08-12 NOTE — Progress Notes (Signed)
 Patient presents for evaluation and treatment of tenderness and some redness around nails feet.  Tenderness around toes with walking and wearing shoes.  Physical exam:  General appearance: Alert, pleasant, and in no acute distress.  Vascular: Pedal pulses: DP 2/4 B/L, PT 0/4 B/L. Moderate edema lower legs bilaterally  Neu  Dermatologic:  Nails thickened, disfigured, discolored 1-5 BL with subungual debris.  Redness and hypertrophic nail folds along nail folds bilaterally but no signs of drainage or infection.  Musculoskeletal:     Diagnosis: 1. Painful onychomycotic nails 1 through 5 bilaterally. 2. Pain toes 1 through 5 bilaterally.  Plan: -Debrided onychomycotic nails 1 through 5 bilaterally.  Sharply debrided nails with nail clipper and reduced with a power bur.  Return 3 months [facility]

## 2024-08-16 ENCOUNTER — Inpatient Hospital Stay
Admission: RE | Admit: 2024-08-16 | Discharge: 2024-08-16 | Disposition: A | Discharge status: Home / Self Care | Source: Ambulatory Visit | Attending: Pulmonary Disease | Admitting: Pulmonary Disease

## 2024-08-16 DIAGNOSIS — I2602 Saddle embolus of pulmonary artery with acute cor pulmonale: Secondary | ICD-10-CM

## 2024-08-16 MED ORDER — IOPAMIDOL (ISOVUE-370) INJECTION 76%
75.0000 mL | Freq: Once | INTRAVENOUS | Status: AC | PRN
  Administered 2024-08-16: 75 mL via INTRAVENOUS

## 2024-08-16 MED ORDER — IOPAMIDOL (ISOVUE-370) INJECTION 76%: 75.0000 mL | Freq: Once | INTRAVENOUS | Status: DC | PRN

## 2024-09-07 ENCOUNTER — Inpatient Hospital Stay: Attending: Medical Oncology

## 2024-09-07 ENCOUNTER — Inpatient Hospital Stay

## 2024-09-07 ENCOUNTER — Inpatient Hospital Stay (HOSPITAL_BASED_OUTPATIENT_CLINIC_OR_DEPARTMENT_OTHER): Admitting: Medical Oncology

## 2024-09-07 ENCOUNTER — Encounter: Payer: Self-pay | Admitting: Medical Oncology

## 2024-09-07 ENCOUNTER — Inpatient Hospital Stay: Admitting: Medical Oncology

## 2024-09-07 VITALS — BP 123/90 | HR 90 | Temp 98.2°F | Resp 19 | Ht 66.0 in | Wt 241.1 lb

## 2024-09-07 DIAGNOSIS — D649 Anemia, unspecified: Secondary | ICD-10-CM | POA: Diagnosis not present

## 2024-09-07 DIAGNOSIS — D72819 Decreased white blood cell count, unspecified: Secondary | ICD-10-CM | POA: Diagnosis not present

## 2024-09-07 DIAGNOSIS — Z86718 Personal history of other venous thrombosis and embolism: Secondary | ICD-10-CM

## 2024-09-07 DIAGNOSIS — N179 Acute kidney failure, unspecified: Secondary | ICD-10-CM

## 2024-09-07 DIAGNOSIS — Z7901 Long term (current) use of anticoagulants: Secondary | ICD-10-CM | POA: Diagnosis not present

## 2024-09-07 DIAGNOSIS — D696 Thrombocytopenia, unspecified: Secondary | ICD-10-CM

## 2024-09-07 DIAGNOSIS — Z86711 Personal history of pulmonary embolism: Secondary | ICD-10-CM | POA: Diagnosis not present

## 2024-09-07 DIAGNOSIS — K7689 Other specified diseases of liver: Secondary | ICD-10-CM | POA: Insufficient documentation

## 2024-09-07 DIAGNOSIS — D539 Nutritional anemia, unspecified: Secondary | ICD-10-CM | POA: Diagnosis not present

## 2024-09-07 DIAGNOSIS — I2699 Other pulmonary embolism without acute cor pulmonale: Secondary | ICD-10-CM

## 2024-09-07 LAB — CBC WITH DIFFERENTIAL (CANCER CENTER ONLY)
Abs Immature Granulocytes: 0.01 K/uL (ref 0.00–0.07)
Basophils Absolute: 0 K/uL (ref 0.0–0.1)
Basophils Relative: 0 %
Eosinophils Absolute: 0.1 K/uL (ref 0.0–0.5)
Eosinophils Relative: 3 %
HCT: 39.4 % (ref 39.0–52.0)
Hemoglobin: 12.5 g/dL — ABNORMAL LOW (ref 13.0–17.0)
Immature Granulocytes: 0 %
Lymphocytes Relative: 35 %
Lymphs Abs: 1.5 K/uL (ref 0.7–4.0)
MCH: 32 pg (ref 26.0–34.0)
MCHC: 31.7 g/dL (ref 30.0–36.0)
MCV: 100.8 fL — ABNORMAL HIGH (ref 80.0–100.0)
Monocytes Absolute: 0.5 K/uL (ref 0.1–1.0)
Monocytes Relative: 13 %
Neutro Abs: 2 K/uL (ref 1.7–7.7)
Neutrophils Relative %: 49 %
Platelet Count: 104 K/uL — ABNORMAL LOW (ref 150–400)
RBC: 3.91 MIL/uL — ABNORMAL LOW (ref 4.22–5.81)
RDW: 16.7 % — ABNORMAL HIGH (ref 11.5–15.5)
WBC Count: 4.2 K/uL (ref 4.0–10.5)
nRBC: 0 % (ref 0.0–0.2)

## 2024-09-07 LAB — CMP (CANCER CENTER ONLY)
ALT: 23 U/L (ref 0–44)
AST: 27 U/L (ref 15–41)
Albumin: 4 g/dL (ref 3.5–5.0)
Alkaline Phosphatase: 70 U/L (ref 38–126)
Anion gap: 11 (ref 5–15)
BUN: 19 mg/dL (ref 6–20)
CO2: 22 mmol/L (ref 22–32)
Calcium: 9.2 mg/dL (ref 8.9–10.3)
Chloride: 107 mmol/L (ref 98–111)
Creatinine: 0.93 mg/dL (ref 0.61–1.24)
GFR, Estimated: >60 mL/min (ref >60)
Glucose, Bld: 94 mg/dL (ref 70–99)
Potassium: 4.5 mmol/L (ref 3.5–5.1)
Sodium: 141 mmol/L (ref 135–145)
Total Bilirubin: 0.3 mg/dL (ref 0.0–1.2)
Total Protein: 7.1 g/dL (ref 6.5–8.1)

## 2024-09-07 NOTE — Progress Notes (Signed)
 Hematology and Oncology Follow Up Visit  Travis Rollins [id number] 1966-10-13 58 y.o. 09/07/2024  Past Medical History:  Diagnosis Date   Anemia    Carpal tunnel syndrome    Deep vein thrombosis (HCC) 07/2013, 10/2014, 03/16/2015   LLE (studies at [facility])   Depression    Diabetes mellitus    Hyperlipidemia    Hypertension    Obesity    Osteoarthritis    Peripheral vascular disease    6 yrs ago, DVT in Lt knee/ groin   Pulmonary embolism (HCC)    hx of x 3 per patient; most recent 02/2014   Sinusitis    Sleep apnea     Principle Diagnosis:  Thrombocytopenia Though to be secondary to liver dysfunction Anemia Source being worked up Lupus Anticoagulant with history of clotting events  Current Therapy:   Observation/work up Coumadin - managed by the Coumadin  Clinic     Interim History:  Mr. Vath is back for follow-up for anemia and thrombocytopenia:   He was previously seen by our office on 08/05/2021 by Dr. Sherrod for leukocytopenia and thrombocytopenia. He has a history of lupus anticoagulant. At the time of his last referral his lab abnormalities were suspected to be secondary to his allopurinol and lipitor. At the time of his last visit his TSH was 1.099, RF <10, HIV negative, acute hepatitis panel negative, ANA was negative, B12 633, platelets 99, Hgb 14.5, WBC 3.5, creatinine of 1.33 and normal LFTs, LDH of 195, ferritin 173, iron saturation of 30%. It was discussed that if lab abnormalities did not improve a BMB would be suggested.    He has a history of PE, Anemia and low platelets. He also has a history of Transaminitis. He originally presented to the ER on 07/07/2024 for acute SOB. PE study negative. Then was taken to the ER after a syncopal episode where he was found to have a saddle PE with heart strain. INR at that time was 1.3 and platelets were 67. He was started on IV heparin  and admitted to the ICU. He had a mechanical thrombectomy and bilateral clot retreval  IVC filter removed and replaced on 07/12/2024. Hematology was consulted and he was switched to Lovenox  and Coumadin . Target INR of 3-3.5 recommended by Hematology. He was discharged with an INR of 3.7. He is now followed by the coumadin  clinic.    He has a history of PE in 2015, 2021 and three others per patient as well as chronic lower DVT- was on coumadin  however it appears from records that he was not having his INR checked so it is unclear if he was in target range or not. He has trailed and failed multiple blood thinners.      At his 08/03/2024 visit labs showed CBC shows mild neutropenia with a value of 3.7 with ANC of 2.2. Hgb has significantly improved from 9 to 12.2 and platelets have recovered from 82 to 159. CMP showed a creatinine of 1.06 and improved LFTS with a value of AST 41, ALT 63. LDH 327 which is up from 195 (08/22)  Today he states that he has been well. He has no concerns.   He has been taking his Coumadin  as directed. He is feeling well without SOB, chest pains or peripheral edema.   He is scheduled to follow up with GI on 10/11/2024 to discuss his new anemia and liver dysfunction.   There has been no bleeding to his knowledge: denies epistaxis, gingivitis, hemoptysis, hematemesis, hematuria, melena, excessive bruising,  blood donation.   No abdominal pain, fevers, recurrent illness or infections.    Wt Readings from Last 3 Encounters:  09/07/24 241 lb 1.3 oz (109.4 kg)  08/12/24 240 lb (108.9 kg)  08/11/24 240 lb (108.9 kg)     Medications:   Current Outpatient Medications:    allopurinol (ZYLOPRIM) 100 MG tablet, Take 100 mg by mouth daily., Disp: , Rfl:    atorvastatin  (LIPITOR) 80 MG tablet, Take 80 mg by mouth daily., Disp: , Rfl:    Capsicum, Cayenne, 455 MG CAPS, Take 2 tablets by mouth in the morning and at bedtime., Disp: , Rfl:    co-enzyme Q-10 30 MG capsule, Take 30 mg by mouth daily., Disp: , Rfl:    ezetimibe  (ZETIA ) 10 MG tablet, Take 10 mg by mouth  daily., Disp: , Rfl:    Febuxostat 80 MG TABS, Take 1 tablet by mouth daily., Disp: , Rfl:    furosemide  (LASIX ) 40 MG tablet, Take 40 mg by mouth every morning. , Disp: , Rfl:    L-ARGININE PO, Take 1 tablet by mouth daily., Disp: , Rfl:    lisinopril  (PRINIVIL ,ZESTRIL ) 2.5 MG tablet, Take 2.5 mg by mouth daily., Disp: , Rfl:    Magnesium 200 MG CHEW, Chew 2 capsules by mouth in the morning and at bedtime., Disp: , Rfl:    metoprolol  succinate (TOPROL -XL) 50 MG 24 hr tablet, Take 50 mg by mouth at bedtime. Take with or immediately following a meal., Disp: , Rfl:    omega-3 acid ethyl esters (LOVAZA) 1 g capsule, Take 1 g by mouth daily., Disp: , Rfl:    potassium chloride  (K-DUR) 10 MEQ tablet, Take 10 mEq by mouth at bedtime. , Disp: , Rfl:    SYNJARDY XR 12.04-999 MG TB24, Take 1 tablet by mouth 2 (two) times daily., Disp: , Rfl:    tamsulosin  (FLOMAX ) 0.4 MG CAPS capsule, Take 0.4 mg by mouth daily after breakfast., Disp: , Rfl:    topiramate  (TOPAMAX ) 50 MG tablet, Take 50 mg by mouth 2 (two) times daily., Disp: , Rfl:    TRESIBA FLEXTOUCH 200 UNIT/ML SOPN, Inject 26 Units into the skin at bedtime., Disp: , Rfl: 3   TRULICITY 3 MG/0.5ML SOPN, Inject 3 mg into the skin once a week., Disp: , Rfl:    warfarin (COUMADIN ) 4 MG tablet, Take 2 tablets (8 mg total) by mouth daily., Disp: 60 tablet, Rfl: 1  Allergies:  Allergies  Allergen Reactions   Penicillins Hives    Has patient had a PCN reaction causing immediate rash, facial/tongue/throat swelling, SOB or lightheadedness with hypotension: No Has patient had a PCN reaction causing severe rash involving mucus membranes or skin necrosis: No Has patient had a PCN reaction that required hospitalization No Has patient had a PCN reaction occurring within the last 10 years: No If all of the above answers are NO, then may proceed with Cephalosporin use.    Past Medical History, Surgical history, Social history, and Family History were reviewed  and updated.  Review of Systems: Review of Systems  Constitutional: Negative.   HENT:  Negative.    Eyes: Negative.   Respiratory: Negative.    Cardiovascular: Negative.   Gastrointestinal: Negative.   Endocrine: Negative.   Genitourinary: Negative.    Musculoskeletal: Negative.   Skin: Negative.   Neurological: Negative.   Hematological: Negative.   Psychiatric/Behavioral: Negative.       Physical Exam:  height is 5' 6 (1.676 m) and weight is 241  lb 1.3 oz (109.4 kg). His oral temperature is 98.2 F (36.8 C). His blood pressure is 123/90 (abnormal) and his pulse is 90. His respiration is 19 and oxygen saturation is 100%.   Physical Exam General: NAD Cardiovascular: regular rate and rhythm Pulmonary: clear ant fields Abdomen: soft, nontender, + bowel sounds GU: no suprapubic tenderness Extremities: no edema, no joint deformities Skin: no rashes Neurological: Weakness but otherwise nonfocal   Lab Results  Component Value Date   WBC 4.2 09/07/2024   HGB 12.5 (L) 09/07/2024   HCT 39.4 09/07/2024   MCV 100.8 (H) 09/07/2024   PLT 104 (L) 09/07/2024     Chemistry      Component Value Date/Time   NA 141 09/07/2024 0845   K 4.5 09/07/2024 0845   CL 107 09/07/2024 0845   CO2 22 09/07/2024 0845   BUN 19 09/07/2024 0845   CREATININE 0.93 09/07/2024 0845      Component Value Date/Time   CALCIUM  9.2 09/07/2024 0845   ALKPHOS 70 09/07/2024 0845   AST 27 09/07/2024 0845   ALT 23 09/07/2024 0845   BILITOT 0.3 09/07/2024 0845     Encounter Diagnoses  Name Primary?   Anemia, unspecified type Yes   Leukopenia, unspecified type    Macrocytic anemia    Thrombocytopenia    Current use of long term anticoagulation    Personal history of venous thrombosis and embolism     Assessment and Plan- Patient is a 58 y.o. male who was referred to us  for thrombocytopenia and anemia.   His Thrombocytopenia is thought to be secondary to liver dysfunction however MDS panel may be  beneficial for completeness. Should counts worsen a BMB suggested. Glad to hear that he is following up with GI for management of his liver dysfunction. Continue avoiding tylenol  and ETOH.   His macrocytic anemia source is unknown. Recent B12 and folate levels were normal. He does have upcoming evaluation with GI. Glad to see counts have improved a bit today. Continue follow up with the Coumadin  clinic  He states that due to transportation concerns he will need to transfer to the [facility]. He has been seen there previously.    Disposition: RTC 2 months APP, labs   Lauraine Dais PA-C 10/1/20259:45 AM

## 2024-09-07 NOTE — Patient Instructions (Signed)
 [facility] Address: [address] [place], [place], [place] [address] Phone: [phone]

## 2024-09-12 ENCOUNTER — Other Ambulatory Visit: Payer: Self-pay

## 2024-09-12 ENCOUNTER — Ambulatory Visit: Attending: Cardiology

## 2024-09-12 DIAGNOSIS — I2699 Other pulmonary embolism without acute cor pulmonale: Secondary | ICD-10-CM

## 2024-09-12 DIAGNOSIS — I824Y9 Acute embolism and thrombosis of unspecified deep veins of unspecified proximal lower extremity: Secondary | ICD-10-CM | POA: Insufficient documentation

## 2024-09-12 DIAGNOSIS — I82532 Chronic embolism and thrombosis of left popliteal vein: Secondary | ICD-10-CM

## 2024-09-12 DIAGNOSIS — Z7901 Long term (current) use of anticoagulants: Secondary | ICD-10-CM | POA: Diagnosis not present

## 2024-09-12 LAB — POCT INR: INR: 3.2 — AB (ref 2.0–3.0)

## 2024-09-12 NOTE — Patient Instructions (Signed)
 Continue taking 2 tablets daily.  INR in 2 weeks.  [phone]  A full discussion of the nature of anticoagulants has been carried out.  A benefit risk analysis has been presented to the patient, so that they understand the justification for choosing anticoagulation at this time. The need for frequent and regular monitoring, precise dosage adjustment and compliance is stressed.  Side effects of potential bleeding are discussed.  The patient should avoid any OTC items containing aspirin  or ibuprofen, and should avoid great swings in general diet.  Avoid alcohol consumption.  Call if any signs of abnormal bleeding.

## 2024-09-12 NOTE — Progress Notes (Signed)
 INR 3.2 Please see anticoagulation encounter Continue taking 2 tablets daily.  INR in 2 weeks.  [phone]  A full discussion of the nature of anticoagulants has been carried out.  A benefit risk analysis has been presented to the patient, so that they understand the justification for choosing anticoagulation at this time. The need for frequent and regular monitoring, precise dosage adjustment and compliance is stressed.  Side effects of potential bleeding are discussed.  The patient should avoid any OTC items containing aspirin  or ibuprofen, and should avoid great swings in general diet.  Avoid alcohol consumption.  Call if any signs of abnormal bleeding.

## 2024-09-12 NOTE — Progress Notes (Unsigned)
 Cardiology Office Note:    Date:  09/13/2024   ID:  [name], DOB September 18, 1966, MRN [id number]  PCP:  [name], NP  Cardiologist:  Newman JINNY Lawrence, MD Cardiology APP:  Jadine Aline FORBES DEVONNA     Referring MD: [name], NP   Chief Complaint: hospital follow-up of acute saddle PE  History of Present Illness:    Travis Rollins is a 58 y.o. 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 Lawrence and presents today for hospital follow-up of acute saddle PE.   Patient has a history of CAD. LHC in 08/2015 showed mild non-obstructive CAD with only 30% stenosis of mid LAD and 20% stenosis of proximal LCx. He also has a history of recurrent DVT/ PE and ultimately had an IVC filter placed in 2015. He has been on multiple different anticoagulants before including Coumadin , Xarelto, and Eliquis. Hematology/ Oncology recommended going back to Coumadin  in 05/2021 after he was diagnosed with recurrent bilateral PEs. Hypercoagulable work-up in the past showed lupus anticoagulant.   He was last seen by Dr. Lawrence in 09/2023 at which time he was stable from a cardiac standpoint.   He was admitted from 07/12/2024 to 07/21/2024 for an acute saddle PE and obstructive shock after presenting with shortness of breath, syncope, and hypotension with systolic BP in the 70s. CTA did shows signs of RV strain. Lower extremity venous dopplers showed acute DVT in right lower extremity and chronic DVT in the left lower extremity. INR was 1.3 at the time.   Echo showed LVEF of 45-50%, mildly reduced RV function, and no significant valvular disease. He was started on IV Heparin  and underwent pulmonary arteriogram with mechanical thrombectomy and IVC retrieval and then placement by IR. HemOnc recommended continuing Coumadin  with Lovenox  bridge following procedure. Hospitalization was complicated by AKI, hypovolemic hyponatremia, and acute metabolic acidosis that all improved with IV fluids.    He saw Dr. Annella (Pulmonology)  on 08/11/2024 who recommended repeating CTA and Echo at 49-month interval with plans to refer to [facility] if there were any ongoing abnormalities. Repeat chest CTA on 08/16/2024 showed near complete resolution of the large volume bilateral PE noted on prior CT with a couple of small, weblike chronic emboli in the left lower lobe.  Patient presents today for follow-up.  He is doing well since recent discharge.  He worked with PT for 2 months after hospitalization and states that really helped his breathing.  He is no longer having any shortness of breath.  He has chronic but stable lower extremity swelling with his left leg chronically being larger than his right.  No other cardiac symptoms.  He states he is losing a little weight and overall feeling better.  ROS: No chest pain, shortness of breath, orthopnea, PND, palpitations, lightheadedness, dizziness, syncope.   EKGs/Labs/Other Studies Reviewed:    The following studies were reviewed:  Left Cardiac Catheterization 08/10/2015: LAD mid calcified 30%, proximal Cx 20%. Codominant Cx. Normal LVEF. IVC filter in situ and appears to be stable and normally placed with hook for retreival facing SVC _______________  Echocardiogram 07/13/2024:  Impressions: 1. Left ventricular ejection fraction, by estimation, is 45 to 50%. The  left ventricle has mildly decreased function. Left ventricular endocardial  border not optimally defined to evaluate regional wall motion. Left  ventricular diastolic  parameters were  grossly normal.   2. Right ventricular systolic function is mildly reduced. The right  ventricular size is normal.   3. The mitral valve is grossly normal. Trivial mitral valve  regurgitation. No evidence of mitral stenosis.   4. The aortic valve is grossly normal. Aortic valve regurgitation is  mild. No aortic stenosis is present.  _______________  Lower Extremity Venous Dopplers 08/14/2024: [place]: RIGHT:  - Findings consistent with acute deep vein thrombosis involving the  gastrocnemius, and right popliteal vein.    LEFT:  - Findings consistent with chronic deep vein thrombus involving the  femoral vein and acute on chronic thrombus involving the popliteal vein.    EKG:  EKG not ordered today.   Recent Labs: 07/12/2024: B Natriuretic Peptide 12.0 07/21/2024: Magnesium 2.2 09/07/2024: ALT 23; BUN 19; Creatinine 0.93; Hemoglobin 12.5; Platelet Count 104; Potassium 4.5; Sodium 141  Recent Lipid Panel    Component Value Date/Time   CHOL 209 (H) 03/22/2012 0515   TRIG 166 (H) 03/22/2012 0515   HDL 44 03/22/2012 0515   CHOLHDL 4.8 03/22/2012 0515   VLDL 33 03/22/2012 0515   LDLCALC 132 (H) 03/22/2012 0515    Physical Exam:    Vital Signs: BP 116/72   Pulse 86   Ht 5' 6 (1.676 m)   Wt 239 lb (108.4 kg)   SpO2 96%   BMI 38.58 kg/m     Wt Readings from Last 3 Encounters:  09/13/24 239 lb (108.4 kg)  09/07/24 241 lb 1.3 oz (109.4 kg)  08/12/24 240 lb (108.9 kg)     General: 57 y.o. obese African-American male in no acute distress. HEENT: Normocephalic and atraumatic. Sclera clear.  Neck: Supple. No JVD. Heart: RRR. Distinct S1 and S2. No murmurs, gallops, or rubs.  Lungs: No increased work of breathing. Clear to ausculation bilaterally. No wheezes, rhonchi, or rales.  Extremities: Hard lower extremity edema bilaterally (left leg chronically larger than right) with skin changes consistent with chronic venous insufficiency.  Skin: Warm and dry. Neuro:  No focal deficits. Psych: Normal affect. Responds appropriately.   Assessment:    1. Chronic HFrEF (heart failure with reduced ejection fraction) (HCC)   2. Syncope and collapse     Plan:    Syncope Patient recent presented with syncope in 07/2024 in setting of acute saddle PE with obstructive shock ultimately requiring mechanical thrombectomy. Echo during this admission showed LVEF of 45-50% and no significant valvular disease.  - No recurrence.  - No additional work-up necessary at this time as syncopal episode was related to PE.  Chronic HFrEF Echo in 07/2024 during recent admission for acute saddle PE showed LVEF of 45-50%, mildly reduced RV function, and no significant valvular disease. - Euvolemic on exam. He has chronic lower extremity edema largely due to recurrent DVT. Otherwise, no signs or symptoms of CHF. - Continue Lasix  40mg  daily.  - Continue Lisinopril  2.5mg  daily.  - Continue Toprol -XL 50mg  daily.  - Mildly reduced EF likely secondary to saddle PE. Pulmonology is planning on repeat Echo in 10/2024. Will make myself a reminder to follow-up on this given Pulmonology ordered and results will not come back to us . If EF still low at that time, can adjust GDMT at that time.   Non-Obstructive CAD  LHC in 2016 showed mild non-obsructive disease.  - No chest pain.  - No Aspirin  given need for full anticoagulation.  - Continue statin/ Zetia .   Hypertension BP well controlled.  - Continue medications for CHF as above.  Hyperlipidemia - Continue Lipitor 80mg  daily and Zetia  10mg  daily.  - Managed by Dr. PCP.   Type 2 Diabetes Mellitus - On Synjardy, Trulicity, and  Tresiba. - Management per PCP.  Recurrent DVT/ PE s/p IVC Recent Saddle PE Patient has a history of recurrent DVT and PE. He was recently admitted in 07/2024 for acute saddle PE. Lower extremity venous doppler showed acute DVT in right leg and chronic DVT in left leg. He underwent mechanical thrombectomy with  IVC filter retrieval and then placement.  - Doing much better.  - Continue Coumadin . INR 3.2 on 09/12/2024.  - Management per Pulmonology. Plan is to repeat CTA and Echo at 3 month interval and refer to [facility] if there are ongoing abnormalities.   Disposition: Follow up in 4 months with Dr. [place]ira.    Signed, Aline FORBES Door, PA-C  09/13/2024 12:45 PM    [facility]

## 2024-09-13 ENCOUNTER — Encounter: Payer: Self-pay | Admitting: Student

## 2024-09-13 ENCOUNTER — Ambulatory Visit: Attending: Student | Admitting: Student

## 2024-09-13 VITALS — BP 116/72 | HR 86 | Ht 66.0 in | Wt 239.0 lb

## 2024-09-13 DIAGNOSIS — I5022 Chronic systolic (congestive) heart failure: Secondary | ICD-10-CM | POA: Diagnosis not present

## 2024-09-13 DIAGNOSIS — R55 Syncope and collapse: Secondary | ICD-10-CM | POA: Diagnosis not present

## 2024-09-13 NOTE — Patient Instructions (Signed)
 Medication Instructions:  Your physician recommends that you continue on your current medications as directed. Please refer to the Current Medication list given to you today.  *If you need a refill on your cardiac medications before your next appointment, please call your pharmacy*  Lab Work: NONE If you have labs (blood work) drawn today and your tests are completely normal, you will receive your results only by: MyChart Message (if you have MyChart) OR A paper copy in the mail If you have any lab test that is abnormal or we need to change your treatment, we will call you to review the results.  Testing/Procedures: You need to schedule Echocardiogram.   Follow-Up: At [facility], you and your health needs are our priority.  As part of our continuing mission to provide you with exceptional heart care, our providers are all part of one team.  This team includes your primary Cardiologist (physician) and Advanced Practice Providers or APPs (Physician Assistants and Nurse Practitioners) who all work together to provide you with the care you need, when you need it.  Your next appointment:   4 month(s)  Provider:   Newman Lawrence, MD

## 2024-09-26 ENCOUNTER — Ambulatory Visit

## 2024-10-04 ENCOUNTER — Ambulatory Visit

## 2024-10-07 ENCOUNTER — Telehealth: Payer: Self-pay | Admitting: Cardiology

## 2024-10-07 ENCOUNTER — Ambulatory Visit: Attending: Cardiology

## 2024-10-07 DIAGNOSIS — Z7901 Long term (current) use of anticoagulants: Secondary | ICD-10-CM

## 2024-10-07 DIAGNOSIS — I82532 Chronic embolism and thrombosis of left popliteal vein: Secondary | ICD-10-CM

## 2024-10-07 DIAGNOSIS — I2699 Other pulmonary embolism without acute cor pulmonale: Secondary | ICD-10-CM

## 2024-10-07 LAB — POCT INR: INR: 3.5 — AB (ref 2.0–3.0)

## 2024-10-07 NOTE — Telephone Encounter (Signed)
 Patient called to report his INR reading is 4.0.

## 2024-10-07 NOTE — Patient Instructions (Signed)
 Continue taking 2 tablets daily.  INR in 3 weeks.  [phone]

## 2024-10-07 NOTE — Progress Notes (Signed)
 INR 3.5 Please see anticoagulation encounter Continue taking 2 tablets daily.  INR in 3 weeks.  [phone]

## 2024-10-10 ENCOUNTER — Encounter: Payer: Self-pay | Admitting: Radiology

## 2024-10-10 NOTE — Telephone Encounter (Signed)
 INR addressed, see anticoagulation encounter from 10/07/24.

## 2024-10-11 ENCOUNTER — Encounter: Payer: Self-pay | Admitting: Gastroenterology

## 2024-10-11 ENCOUNTER — Other Ambulatory Visit (INDEPENDENT_AMBULATORY_CARE_PROVIDER_SITE_OTHER)

## 2024-10-11 ENCOUNTER — Ambulatory Visit: Admitting: Gastroenterology

## 2024-10-11 VITALS — BP 122/64 | HR 82 | Ht 66.0 in | Wt 245.1 lb

## 2024-10-11 DIAGNOSIS — I2510 Atherosclerotic heart disease of native coronary artery without angina pectoris: Secondary | ICD-10-CM

## 2024-10-11 DIAGNOSIS — Z7901 Long term (current) use of anticoagulants: Secondary | ICD-10-CM

## 2024-10-11 DIAGNOSIS — Z794 Long term (current) use of insulin: Secondary | ICD-10-CM

## 2024-10-11 DIAGNOSIS — Z23 Encounter for immunization: Secondary | ICD-10-CM

## 2024-10-11 DIAGNOSIS — I2699 Other pulmonary embolism without acute cor pulmonale: Secondary | ICD-10-CM | POA: Diagnosis not present

## 2024-10-11 DIAGNOSIS — D649 Anemia, unspecified: Secondary | ICD-10-CM | POA: Diagnosis not present

## 2024-10-11 DIAGNOSIS — K5900 Constipation, unspecified: Secondary | ICD-10-CM

## 2024-10-11 DIAGNOSIS — D696 Thrombocytopenia, unspecified: Secondary | ICD-10-CM

## 2024-10-11 DIAGNOSIS — R7401 Elevation of levels of liver transaminase levels: Secondary | ICD-10-CM

## 2024-10-11 DIAGNOSIS — Z860100 Personal history of colon polyps, unspecified: Secondary | ICD-10-CM

## 2024-10-11 DIAGNOSIS — R7989 Other specified abnormal findings of blood chemistry: Secondary | ICD-10-CM

## 2024-10-11 DIAGNOSIS — K760 Fatty (change of) liver, not elsewhere classified: Secondary | ICD-10-CM | POA: Diagnosis not present

## 2024-10-11 LAB — PROTIME-INR
INR: 4.3 ratio — ABNORMAL HIGH (ref 0.8–1.0)
Prothrombin Time: 43.1 s — ABNORMAL HIGH (ref 9.6–13.1)

## 2024-10-11 LAB — COMPREHENSIVE METABOLIC PANEL WITH GFR
ALT: 18 U/L (ref 0–53)
AST: 19 U/L (ref 0–37)
Albumin: 3.9 g/dL (ref 3.5–5.2)
Alkaline Phosphatase: 76 U/L (ref 39–117)
BUN: 21 mg/dL (ref 6–23)
CO2: 26 meq/L (ref 19–32)
Calcium: 8.9 mg/dL (ref 8.4–10.5)
Chloride: 109 meq/L (ref 96–112)
Creatinine, Ser: 1.26 mg/dL (ref 0.40–1.50)
GFR: 63.39 mL/min (ref >60.00)
Glucose, Bld: 96 mg/dL (ref 70–99)
Potassium: 4.3 meq/L (ref 3.5–5.1)
Sodium: 141 meq/L (ref 135–145)
Total Bilirubin: 0.3 mg/dL (ref 0.2–1.2)
Total Protein: 7.4 g/dL (ref 6.0–8.3)

## 2024-10-11 LAB — CBC WITH DIFFERENTIAL/PLATELET
Basophils Absolute: 0 K/uL (ref 0.0–0.1)
Basophils Relative: 0.8 % (ref 0.0–3.0)
Eosinophils Absolute: 0.2 K/uL (ref 0.0–0.7)
Eosinophils Relative: 4.9 % (ref 0.0–5.0)
HCT: 42.3 % (ref 39.0–52.0)
Hemoglobin: 13.7 g/dL (ref 13.0–17.0)
Lymphocytes Relative: 33.7 % (ref 12.0–46.0)
Lymphs Abs: 1.2 K/uL (ref 0.7–4.0)
MCHC: 32.3 g/dL (ref 30.0–36.0)
MCV: 100.9 fl — ABNORMAL HIGH (ref 78.0–100.0)
Monocytes Absolute: 0.5 K/uL (ref 0.1–1.0)
Monocytes Relative: 13.7 % — ABNORMAL HIGH (ref 3.0–12.0)
Neutro Abs: 1.7 K/uL (ref 1.4–7.7)
Neutrophils Relative %: 46.9 % (ref 43.0–77.0)
Platelets: 111 K/uL — ABNORMAL LOW (ref 150.0–400.0)
RBC: 4.2 Mil/uL — ABNORMAL LOW (ref 4.22–5.81)
RDW: 17.3 % — ABNORMAL HIGH (ref 11.5–15.5)
WBC: 3.5 K/uL — ABNORMAL LOW (ref 4.0–10.5)

## 2024-10-11 NOTE — Progress Notes (Signed)
 Noted

## 2024-10-11 NOTE — Progress Notes (Signed)
 Chief Complaint: Anemia/elevated LFTs Primary GI MD: Dr. Abran  HPI:  58 y.o. male past medical history of obesity, diabetes mellitus type 2 on insulin , essential hypertension DVT and PE on Coumadin  in the past with a history of IVC replacement presents for evaluation of elevated LFTs and anemia   Patient recently admitted August 2025 for saddle PE.  Patient was started on IV heparin  and admitted to ICU and underwent mechanical thrombectomy and bilateral clot retrieval IVC filter removal and replacement 07/12/2024.  Oncology was consulted and switched to therapeutic Lovenox  and started on Coumadin .  During this time patient was noted to have anemia and thrombocytopenia.  By the time he followed up with hematology this was improving.  Labs 08/03/2024  ALT 63/AST 41/alk phos 97/total bilirubin 0.6 RUQ ultrasound showed hepatic steatosis but exam was limited due to body habitus Hgb 12.2 (increased from 9.0 8/14) Platelets 159 (improved from 82 8/14) Iron 114, saturation 45%, ferritin 266  Repeat draw 09/07/2024 AST 27/ALT 23/alk phos 70 Hgb 12.5, platelets 104   Discussed the use of AI scribe software for clinical note transcription with the patient, who gave verbal consent to proceed.  He was admitted to the hospital on August 5th for a severe pulmonary embolism, requiring a mechanical thrombectomy and filter replacement. He was hospitalized for ten days and was started on Lovenox  and Coumadin . During his hospital stay, he experienced anemia, which has since almost resolved.  He is primarily concerned about his liver, which was noted to be fatty during his hospital stay. He has a history of being diagnosed with diabetes in April 2012, at which time he weighed 330 pounds. Since then, he has lost nearly 90 pounds through dietary changes, including reducing salt and fast food intake, and incorporating apple cider vinegar and lemon water into his routine. He is seeking further evaluation of his  liver's condition.  He has a history of constipation and diarrhea, which he attributes to dietary changes, specifically the consumption of oatmeal. He experienced diarrhea a couple of weeks ago, which has since resolved, but now reports constipation after running out of oatmeal. He plans to resume eating oatmeal to manage his bowel movements.  He inquires about hepatitis, expressing concern due to its association with liver problems. He denies any risk factors such as IV drug use or unprotected sex and has been tested negative for hepatitis B and C. He is not immune to hepatitis B   PREVIOUS GI WORKUP   Colonoscopy 10/2018 5 mm polyp (tubular adenoma) in ascending colon, internal hemorrhoids, repeat 5 years  Past Medical History:  Diagnosis Date   Anemia    Carpal tunnel syndrome    Deep vein thrombosis (HCC) 07/2013, 10/2014, 03/16/2015   LLE (studies at [facility]   Depression    Diabetes mellitus    Hyperlipidemia    Hypertension    Obesity    Osteoarthritis    Peripheral vascular disease    6 yrs ago, DVT in Lt knee/ groin   Pulmonary embolism (HCC)    hx of x 3 per patient; most recent 02/2014   Sinusitis    Sleep apnea     Past Surgical History:  Procedure Laterality Date   CARDIAC CATHETERIZATION N/A 08/10/2015   Procedure: Left Heart Cath and Coronary Angiography;  Surgeon: Gordy Bergamo, MD;  Location: [facility] INVASIVE CV LAB;  Service: Cardiovascular;  Laterality: N/A;   CYSTOSCOPY WITH RETROGRADE PYELOGRAM, URETEROSCOPY AND STENT PLACEMENT Bilateral 01/03/2015   Procedure: CYSTOSCOPY WITH RETROGRADE  PYELOGRAM, RIGHT URETEROSCOPY AND RIGHT STENT PLACEMENT;  Surgeon: Ricardo Likens, MD;  Location: [facility];  Service: Urology;  Laterality: Bilateral;   CYSTOSCOPY WITH RETROGRADE PYELOGRAM, URETEROSCOPY AND STENT PLACEMENT Right 03/12/2016   Procedure: CYSTOSCOPY WITH RETROGRADE PYELOGRAM, URETEROSCOPY AND STENT PLACEMENT;  Surgeon: Ricardo Likens, MD;  Location: [facility];  Service: Urology;   Laterality: Right;   ENDOVENOUS ABLATION SAPHENOUS VEIN W/ LASER Left 04/20/2018   endovenous laser ablation left greater saphenous vein by Lynwood Collum MD    HOLMIUM LASER APPLICATION Right 01/03/2015   Procedure: HOLMIUM LASER APPLICATION;  Surgeon: Ricardo Likens, MD;  Location: [facility];  Service: Urology;  Laterality: Right;   HOLMIUM LASER APPLICATION Right 03/12/2016   Procedure: HOLMIUM LASER APPLICATION;  Surgeon: Ricardo Likens, MD;  Location: [facility];  Service: Urology;  Laterality: Right;   IR ANGIOGRAM PULMONARY BILATERAL SELECTIVE  07/12/2024   IR ANGIOGRAM SELECTIVE EACH ADDITIONAL VESSEL  07/12/2024   IR ANGIOGRAM SELECTIVE EACH ADDITIONAL VESSEL  07/12/2024   IR IVC FILTER PLMT / S&I /IMG GUID/MOD SED  07/12/2024   IR IVC FILTER RETRIEVAL / S&I /IMG GUID/MOD SED  07/12/2024   IR THROMBECT PRIM MECH ADD (INCLU) MOD SED  07/12/2024   IR THROMBECT PRIM MECH ADD (INCLU) MOD SED  07/12/2024   IR THROMBECT PRIM MECH INIT (INCLU) MOD SED  07/12/2024   IR US  GUIDE VASC ACCESS RIGHT  07/12/2024   IR US  GUIDE VASC ACCESS RIGHT  07/12/2024   IR VENOCAVAGRAM IVC  07/12/2024   IVC filter   07/2014    TONSILLECTOMY     tubes removd from ears      Current Outpatient Medications  Medication Sig Dispense Refill   atorvastatin  (LIPITOR) 80 MG tablet Take 80 mg by mouth daily.     ezetimibe  (ZETIA ) 10 MG tablet Take 10 mg by mouth daily.     Febuxostat 80 MG TABS Take 1 tablet by mouth daily.     furosemide  (LASIX ) 40 MG tablet Take 40 mg by mouth every morning.      L-ARGININE PO Take 1 tablet by mouth daily.     lisinopril  (PRINIVIL ,ZESTRIL ) 2.5 MG tablet Take 2.5 mg by mouth daily.     metoprolol  succinate (TOPROL -XL) 50 MG 24 hr tablet Take 50 mg by mouth at bedtime. Take with or immediately following a meal.     potassium chloride  (K-DUR) 10 MEQ tablet Take 10 mEq by mouth at bedtime.      SYNJARDY XR 12.04-999 MG TB24 Take 1 tablet by mouth 2 (two) times daily.     tamsulosin  (FLOMAX ) 0.4 MG CAPS capsule  Take 0.4 mg by mouth daily after breakfast.     topiramate  (TOPAMAX ) 50 MG tablet Take 50 mg by mouth 2 (two) times daily.     TRESIBA FLEXTOUCH 200 UNIT/ML SOPN Inject 26 Units into the skin at bedtime.  3   TRULICITY 3 MG/0.5ML SOPN Inject 3 mg into the skin once a week.     warfarin (COUMADIN ) 4 MG tablet Take 2 tablets (8 mg total) by mouth daily. 60 tablet 1   allopurinol (ZYLOPRIM) 100 MG tablet Take 100 mg by mouth daily. (Patient not taking: Reported on 10/11/2024)     Capsicum, Cayenne, 455 MG CAPS Take 2 tablets by mouth in the morning and at bedtime. (Patient not taking: Reported on 10/11/2024)     co-enzyme Q-10 30 MG capsule Take 30 mg by mouth daily. (Patient not taking: Reported on 10/11/2024)  Magnesium 200 MG CHEW Chew 2 capsules by mouth in the morning and at bedtime. (Patient not taking: Reported on 10/11/2024)     omega-3 acid ethyl esters (LOVAZA) 1 g capsule Take 1 g by mouth daily. (Patient not taking: Reported on 10/11/2024)     No current facility-administered medications for this visit.    Allergies as of 10/11/2024 - Review Complete 10/11/2024  Allergen Reaction Noted   Penicillins Hives 03/20/2012    Family History  Problem Relation Age of Onset   Kidney disease Father    Diabetes Father    Colon cancer Neg Hx    Esophageal cancer Neg Hx    Rectal cancer Neg Hx    Stomach cancer Neg Hx     Social History   Socioeconomic History   Marital status: Single    Spouse name: Not on file   Number of children: 0   Years of education: Not on file   Highest education level: Not on file  Occupational History   Not on file  Tobacco Use   Smoking status: Never   Smokeless tobacco: Never  Vaping Use   Vaping status: Never Used  Substance and Sexual Activity   Alcohol use: Never   Drug use: No   Sexual activity: Yes    Birth control/protection: Condom  Other Topics Concern   Not on file  Social History Narrative   Not on file   Social Drivers of Health    Financial Resource Strain: Not on file  Food Insecurity: No Food Insecurity (08/03/2024)   Hunger Vital Sign    Worried About Running Out of Food in the Last Year: Never true    Ran Out of Food in the Last Year: Never true  Transportation Needs: No Transportation Needs (07/15/2024)   PRAPARE - Administrator, Civil Service (Medical): No    Lack of Transportation (Non-Medical): No  Physical Activity: Not on file  Stress: Not on file  Social Connections: Unknown (06/30/2023)   [facility] from [facility]   Social Network    Social Network: Not on file  Intimate Partner Violence: Not At Risk (08/03/2024)   Humiliation, Afraid, Rape, and Kick questionnaire    Fear of Current or Ex-Partner: No    Emotionally Abused: No    Physically Abused: No    Sexually Abused: No    Review of Systems:    Constitutional: No weight loss, fever, chills, weakness or fatigue HEENT: Eyes: No change in vision               Ears, Nose, Throat:  No change in hearing or congestion Skin: No rash or itching Cardiovascular: No chest pain, chest pressure or palpitations   Respiratory: No SOB or cough Gastrointestinal: See HPI and otherwise negative Genitourinary: No dysuria or change in urinary frequency Neurological: No headache, dizziness or syncope Musculoskeletal: No new muscle or joint pain Hematologic: No bleeding or bruising Psychiatric: No history of depression or anxiety    Physical Exam:  Vital signs: BP 122/64   Pulse 82   Ht 5' 6 (1.676 m)   Wt 245 lb 2 oz (111.2 kg)   BMI 39.56 kg/m   Constitutional: NAD, alert and cooperative Head:  Normocephalic and atraumatic. Eyes:   PEERL, EOMI. No icterus. Conjunctiva pink. Respiratory: Respirations even and unlabored. Lungs clear to auscultation bilaterally.   No wheezes, crackles, or rhonchi.  Cardiovascular:  Regular rate and rhythm. No peripheral edema, cyanosis or pallor.  Gastrointestinal:  Soft, nondistended, nontender. No  rebound or guarding. Normal bowel sounds. No appreciable masses or hepatomegaly. Rectal:  Declines Msk:  Symmetrical without gross deformities. Without edema, no deformity or joint abnormality.  Neurologic:  Alert and  oriented x4;  grossly normal neurologically.  Skin:   Dry and intact without significant lesions or rashes. Psychiatric: Oriented to person, place and time. Demonstrates good judgement and reason without abnormal affect or behaviors.  Physical Exam    RELEVANT LABS AND IMAGING: CBC    Component Value Date/Time   WBC 4.2 09/07/2024 0845   WBC 4.3 07/21/2024 0413   RBC 3.91 (L) 09/07/2024 0845   HGB 12.5 (L) 09/07/2024 0845   HCT 39.4 09/07/2024 0845   PLT 104 (L) 09/07/2024 0845   MCV 100.8 (H) 09/07/2024 0845   MCH 32.0 09/07/2024 0845   MCHC 31.7 09/07/2024 0845   RDW 16.7 (H) 09/07/2024 0845   LYMPHSABS 1.5 09/07/2024 0845   MONOABS 0.5 09/07/2024 0845   EOSABS 0.1 09/07/2024 0845   BASOSABS 0.0 09/07/2024 0845    CMP     Component Value Date/Time   NA 141 09/07/2024 0845   K 4.5 09/07/2024 0845   CL 107 09/07/2024 0845   CO2 22 09/07/2024 0845   GLUCOSE 94 09/07/2024 0845   BUN 19 09/07/2024 0845   CREATININE 0.93 09/07/2024 0845   CALCIUM  9.2 09/07/2024 0845   PROT 7.1 09/07/2024 0845   ALBUMIN 4.0 09/07/2024 0845   AST 27 09/07/2024 0845   ALT 23 09/07/2024 0845   ALKPHOS 70 09/07/2024 0845   BILITOT 0.3 09/07/2024 0845   GFRNONAA >60 09/07/2024 0845   GFRAA 52 (L) 03/06/2016 1335     Assessment/Plan:   Elevated LFTs Hepatic steatosis No active hepatitis.  Nonimmune to hepatitis B.  Longstanding history of MASH with recent slight elevation in ALT which has since resolved.  Recent RUQ ultrasound showing steatosis but exam limited due to body habitus. Fib 4 score 3.09 indicative of further workup - Liver elastography - Hepatitis B vaccine - CBC, CMP, PT/INR - Educated patient on steatosis and recommended continued diet and exercise.  His  dramatic weight loss is likely helped his liver health  Anemia Anemia noted during hospitalization for pulmonary embolism that resolved on its own.  No evidence of iron deficiency which is reassuring.  No overt bleeding. - CBC, CMP, PT/INR, iron studies, folate/B12 - Possibly hemodilution versus blood loss from procedure (thrombectomy) that had to be done in the hospital and persistent IVF  PE with history of IVC on Coumadin  PE 07/2024 requiring hospitalization mechanical thrombectomy and bilateral clot retrieval IVC filter removal and replacement on Coumadin .  Due to recent PE patient will have to wait minimum 6 months prior to consideration of elective procedures and would need cardiac clearance and holding blood thinner  Constipation History of constipation if he does not have adequate fiber intake. - Continue adequate fiber intake  History of colon polyps Colonoscopy 10/2018 with 5 mm tubular adenoma and recall 5 years.  Based on updated guidelines patient has a recall of 7 years (10/2025).  In addition to his recent PE he would not be able to get his colonoscopy done for another 6 months anyways. - In absence of symptoms wait for screening colonoscopy 10/2025  Nestor Mollie RIGGERS [facility] 10/11/2024, 3:33 PM  Cc: [name], NP

## 2024-10-11 NOTE — Patient Instructions (Addendum)
 _______________________________________________________  If your blood pressure at your visit was 140/90 or greater, please contact your primary care physician to follow up on this.  _______________________________________________________  If you are age 57 or older, your body mass index should be between 23-30. Your Body mass index is 39.56 kg/m. If this is out of the aforementioned range listed, please consider follow up with your Primary Care Provider.  If you are age 41 or younger, your body mass index should be between 19-25. Your Body mass index is 39.56 kg/m. If this is out of the aformentioned range listed, please consider follow up with your Primary Care Provider.   ________________________________________________________  The [facility] providers would like to encourage you to use MYCHART to communicate with providers for non-urgent requests or questions.  Due to long hold times on the telephone, sending your provider a message by [facility] may be a faster and more efficient way to get a response.  Please allow 48 business hours for a response.  Please remember that this is for non-urgent requests.  _______________________________________________________  [facility] is using a team-based approach to care.  Your team is made up of your doctor and two to three APPS. Our APPS (Nurse Practitioners and Physician Assistants) work with your physician to ensure care continuity for you. They are fully qualified to address your health concerns and develop a treatment plan. They communicate directly with your gastroenterologist to care for you. Seeing the Advanced Practice Practitioners on your physician's team can help you by facilitating care more promptly, often allowing for earlier appointments, access to diagnostic testing, procedures, and other specialty referrals.   Your provider has requested that you go to the basement level for lab work before leaving today. Press B on the  elevator. The lab is located at the first door on the left as you exit the elevator.  You have been scheduled for an abdominal ultrasound with elastography at [facility] (1st floor). Your appointment is scheduled for 10-18-24 at 8:30am. Please arrive 30 minutes prior to your scheduled appointment for registration purposes. Make certain not to have anything to eat or drink 6 hours prior to your procedure. Should you need to reschedule your appointment, you may contact radiology at [phone].  Liver Elastography Various chronic liver diseases such as hepatitis B, C, and fatty liver disease can lead to tissue damage and subsequent scar tissue formation. As the scar tissue accumulates, the liver loses some of its elasticity and becomes stiffer.  Liver elastography involves the use of a surface ultrasound probe that delivers a low frequency pulse or shear wave to a small volume of liver tissue under the rib cage. The transmission of the sound wave is completely painless.  How Is a Liver Elastography Performed? The liver is located in the right upper abdomen under the rib cage. Patients are asked to lie flat on an examination table. A technician places the FibroScan probe between the ribs on the right side of the lower chest wall. A series of 10 painless pulses are then applied to the liver. The results are recorded on the equipment and an overall liver stiffness score is generated. This score is then interpreted by a qualified physician to predict the likelihood of advanced fibrosis or cirrhosis.   Patients are asked to wear loose clothing and should not consume any liquids or solids for a minimum of 4 hours before the test to increase the likelihood of obtaining reliable test results. The scan will take 10 to 15  minutes to complete, but patients should plan on being available for 30 minutes to allow time for preparation  Due to recent changes in healthcare laws, you may see the  results of your imaging and laboratory studies on MyChart before your provider has had a chance to review them.  We understand that in some cases there may be results that are confusing or concerning to you. Not all laboratory results come back in the same time frame and the provider may be waiting for multiple results in order to interpret others.  Please give us  48 hours in order for your provider to thoroughly review all the results before contacting the office for clarification of your results.   Thank you for entrusting me with your care and choosing [facility].  Nestor Blower, PA-C

## 2024-10-12 LAB — B12 AND FOLATE PANEL
Folate: 21.9 ng/mL (ref >5.9)
Vitamin B-12: 471 pg/mL (ref 211–911)

## 2024-10-12 LAB — IBC + FERRITIN
Ferritin: 73.4 ng/mL (ref 22.0–322.0)
Iron: 101 ug/dL (ref 42–165)
Saturation Ratios: 28.2 % (ref 20.0–50.0)
TIBC: 358.4 ug/dL (ref 250.0–450.0)
Transferrin: 256 mg/dL (ref 212.0–360.0)

## 2024-10-14 ENCOUNTER — Ambulatory Visit (INDEPENDENT_AMBULATORY_CARE_PROVIDER_SITE_OTHER): Payer: Self-pay | Admitting: *Deleted

## 2024-10-14 ENCOUNTER — Ambulatory Visit: Payer: Self-pay | Admitting: Gastroenterology

## 2024-10-14 DIAGNOSIS — I2699 Other pulmonary embolism without acute cor pulmonale: Secondary | ICD-10-CM

## 2024-10-14 DIAGNOSIS — Z7901 Long term (current) use of anticoagulants: Secondary | ICD-10-CM | POA: Diagnosis not present

## 2024-10-14 NOTE — Telephone Encounter (Signed)
 Returned call to the patient regarding the message and he states he went to the doctor and had lab work on Tuesday. Please refer to Anticoagulation Encounter from today, 10/14/24.

## 2024-10-14 NOTE — Progress Notes (Signed)
  Description   INR-4.3 from GI lab draw on 10/11/24; Spoke with patient and advised not to take any warfarin tonight then continue taking 2 tablets daily. Recheck INR in 1 week. Anticoagulation Clinic [phone]

## 2024-10-14 NOTE — Telephone Encounter (Signed)
 Pt is requesting a callback regarding him wanting to provide INR readings.

## 2024-10-14 NOTE — Patient Instructions (Addendum)
 Description   INR-4.3 from GI lab draw on 10/11/24; Spoke with patient and advised not to take any warfarin tonight then continue taking 2 tablets daily. Recheck INR in 1 week. Anticoagulation Clinic [phone]

## 2024-10-18 ENCOUNTER — Ambulatory Visit (HOSPITAL_COMMUNITY)
Admission: RE | Admit: 2024-10-18 | Discharge: 2024-10-18 | Disposition: A | Discharge status: Home / Self Care | Source: Ambulatory Visit | Attending: Gastroenterology | Admitting: Gastroenterology

## 2024-10-18 DIAGNOSIS — K760 Fatty (change of) liver, not elsewhere classified: Secondary | ICD-10-CM | POA: Insufficient documentation

## 2024-10-18 DIAGNOSIS — R7989 Other specified abnormal findings of blood chemistry: Secondary | ICD-10-CM | POA: Insufficient documentation

## 2024-10-18 DIAGNOSIS — Z23 Encounter for immunization: Secondary | ICD-10-CM | POA: Insufficient documentation

## 2024-10-19 ENCOUNTER — Ambulatory Visit: Attending: Cardiology | Admitting: *Deleted

## 2024-10-19 DIAGNOSIS — I2699 Other pulmonary embolism without acute cor pulmonale: Secondary | ICD-10-CM | POA: Diagnosis not present

## 2024-10-19 DIAGNOSIS — I82532 Chronic embolism and thrombosis of left popliteal vein: Secondary | ICD-10-CM

## 2024-10-19 DIAGNOSIS — Z7901 Long term (current) use of anticoagulants: Secondary | ICD-10-CM | POA: Diagnosis not present

## 2024-10-19 LAB — POCT INR: INR: 3.9 — AB (ref 2.0–3.0)

## 2024-10-19 NOTE — Progress Notes (Signed)
 Description   INR-3.9; Today take 1 tablet of warfarin then START taking 2 tablets daily except 1 tablet on Sundays. Recheck INR in 1 week. Anticoagulation Clinic [phone]

## 2024-10-19 NOTE — Patient Instructions (Signed)
 Description   INR-3.9; Today take 1 tablet of warfarin then START taking 2 tablets daily except 1 tablet on Sundays. Recheck INR in 1 week. Anticoagulation Clinic [phone]

## 2024-10-25 ENCOUNTER — Ambulatory Visit (HOSPITAL_COMMUNITY)
Admission: RE | Admit: 2024-10-25 | Discharge: 2024-10-25 | Disposition: A | Discharge status: Home / Self Care | Source: Ambulatory Visit | Attending: Cardiology | Admitting: Cardiology

## 2024-10-25 ENCOUNTER — Ambulatory Visit: Admitting: Pharmacist

## 2024-10-25 ENCOUNTER — Other Ambulatory Visit: Payer: Self-pay | Admitting: Pulmonary Disease

## 2024-10-25 DIAGNOSIS — I824Y9 Acute embolism and thrombosis of unspecified deep veins of unspecified proximal lower extremity: Secondary | ICD-10-CM | POA: Diagnosis present

## 2024-10-25 DIAGNOSIS — J9601 Acute respiratory failure with hypoxia: Secondary | ICD-10-CM

## 2024-10-25 DIAGNOSIS — Z7901 Long term (current) use of anticoagulants: Secondary | ICD-10-CM

## 2024-10-25 DIAGNOSIS — I2699 Other pulmonary embolism without acute cor pulmonale: Secondary | ICD-10-CM | POA: Diagnosis present

## 2024-10-25 DIAGNOSIS — I82532 Chronic embolism and thrombosis of left popliteal vein: Secondary | ICD-10-CM | POA: Diagnosis present

## 2024-10-25 DIAGNOSIS — I2602 Saddle embolus of pulmonary artery with acute cor pulmonale: Secondary | ICD-10-CM | POA: Diagnosis present

## 2024-10-25 LAB — ECHOCARDIOGRAM LIMITED
Area-P 1/2: 4.1 cm2
P 1/2 time: 369 ms
S' Lateral: 3.5 cm

## 2024-10-25 LAB — POCT INR: INR: 3.8 — AB (ref 2.0–3.0)

## 2024-10-25 NOTE — Patient Instructions (Addendum)
 Description   INR-3.8: Reduce dose to 1 tablet on Sundays and Tuesdays, 2 tablets all other days of the week.    Anticoagulation Clinic [phone]

## 2024-10-25 NOTE — Progress Notes (Signed)
 Description   INR-3.8: Reduce dose to 1 tablet on Sundays and Tuesdays, 2 tablets all other days of the week.    Anticoagulation Clinic [phone]

## 2024-11-08 ENCOUNTER — Ambulatory Visit: Attending: Cardiology | Admitting: Pharmacist

## 2024-11-08 DIAGNOSIS — I2699 Other pulmonary embolism without acute cor pulmonale: Secondary | ICD-10-CM | POA: Diagnosis not present

## 2024-11-08 DIAGNOSIS — Z7901 Long term (current) use of anticoagulants: Secondary | ICD-10-CM

## 2024-11-08 DIAGNOSIS — I824Y9 Acute embolism and thrombosis of unspecified deep veins of unspecified proximal lower extremity: Secondary | ICD-10-CM

## 2024-11-08 DIAGNOSIS — I82532 Chronic embolism and thrombosis of left popliteal vein: Secondary | ICD-10-CM

## 2024-11-08 LAB — POCT INR: INR: 4.4 — AB (ref 2.0–3.0)

## 2024-11-08 NOTE — Progress Notes (Signed)
 Description   INR-4.4: Hold dose today and then Reduce dose to 1 tablet on Sundays and Tuesdays and Thursdays, 2 tablets all other days of the week.    Anticoagulation Clinic [phone]

## 2024-11-08 NOTE — Patient Instructions (Addendum)
 Description   INR-4.4: Hold dose today and then Reduce dose to 1 tablet on Sundays and Tuesdays and Thursdays, 2 tablets all other days of the week.    Anticoagulation Clinic [phone]

## 2024-11-10 ENCOUNTER — Ambulatory Visit: Admitting: Pulmonary Disease

## 2024-11-11 ENCOUNTER — Ambulatory Visit: Admitting: Podiatry

## 2024-11-11 ENCOUNTER — Encounter: Payer: Self-pay | Admitting: Podiatry

## 2024-11-11 DIAGNOSIS — M79672 Pain in left foot: Secondary | ICD-10-CM

## 2024-11-11 DIAGNOSIS — B351 Tinea unguium: Secondary | ICD-10-CM

## 2024-11-11 DIAGNOSIS — M79671 Pain in right foot: Secondary | ICD-10-CM | POA: Diagnosis not present

## 2024-11-11 NOTE — Progress Notes (Signed)
 Patient presents for evaluation and treatment of tenderness and some redness around nails feet.  Tenderness around toes with walking and wearing shoes.  Physical exam:  General appearance: Alert, pleasant, and in no acute distress.  Vascular: Pedal pulses: DP 2/4 B/L, PT 0/4 B/L. Moderate edema lower legs bilaterally  Neu  Dermatologic:  Nails thickened, disfigured, discolored 1-5 BL with subungual debris.  Redness and hypertrophic nail folds along nail folds bilaterally but no signs of drainage or infection.  Musculoskeletal:     Diagnosis: 1. Painful onychomycotic nails 1 through 5 bilaterally. 2. Pain toes 1 through 5 bilaterally.  Plan: -Debrided onychomycotic nails 1 through 5 bilaterally.  Sharply debrided nails with nail clipper and reduced with a power bur.  Return 3 months [facility]

## 2024-11-14 ENCOUNTER — Ambulatory Visit

## 2024-11-14 DIAGNOSIS — Z23 Encounter for immunization: Secondary | ICD-10-CM | POA: Diagnosis not present

## 2024-11-17 ENCOUNTER — Encounter: Payer: Self-pay | Admitting: Pulmonary Disease

## 2024-11-17 ENCOUNTER — Ambulatory Visit (INDEPENDENT_AMBULATORY_CARE_PROVIDER_SITE_OTHER): Admitting: Pulmonary Disease

## 2024-11-17 VITALS — BP 128/82 | HR 85 | Temp 97.6°F | Ht 66.0 in | Wt 238.6 lb

## 2024-11-17 DIAGNOSIS — I2699 Other pulmonary embolism without acute cor pulmonale: Secondary | ICD-10-CM

## 2024-11-17 DIAGNOSIS — I5189 Other ill-defined heart diseases: Secondary | ICD-10-CM

## 2024-11-17 NOTE — Progress Notes (Signed)
 @Patient  ID: Travis Rollins, male    DOB: [birth year], 58 y.o.   MRN: [id number]  Chief Complaint  Patient presents with   pulmonary embolism    Follow up cta,echo    Referring provider: [name], NP  HPI:   58 y.o. 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  Multiple hospital notes reviewed.  Sound like he passed out.  More short of breath.  Went to the ED.  Large clot burden on my review interpretation.  RV size enlarged.  TTE demonstrated reduced RV function.  Mechanical thrombectomy was performed.  He is feeling better.  INR notably was 1.3 at time of admission.  Low.  He is feeling better now.  Breathing easier.  No real issues.  Taking his warfarin as prescribed.  States last INR was 3..  He states he has follow-up with Dr. Timmy hematologist as well as his PCP for ongoing checks.  Next INR check is tomorrow per his report.  We discussed repeating echocardiogram and CTA to ensure resolution of clot and if there is ongoing signs of RV dysfunction and clot needing additional help.  HPI at initial visit: Patient with multiple PEs for send 2005.  Was on warfarin and Xarelto.  Another PE on anticoagulation and 2015.  IVC filter placed.  Sounds like transition to Eliquis at that time.  Unfortunately had another PE 2022 on Eliquis.  Decision was made to keep him on warfarin with a higher INR goal of 3-3.5.  He states his PCP is monitoring this and managing this.  I see no record of this I can view PCP records.  Has not seen his hematologist in over 2 years.  He does report dose changes over time.  But he cannot tell me what his most recent INR was when asked.  TTE 05/2021 at time of most recent PE with normal RV size and function, normal RA size, reassuring with trivial to no tricuspid  regurgitation.  Questionaires / Pulmonary Flowsheets:   ACT:      No data to display          MMRC:     No data to display          Epworth:      No data to display          Tests:   FENO:  No results found for: NITRICOXIDE  PFT:    Latest Ref Rng & Units 09/15/2023   12:55 PM 08/23/2021   11:36 AM  PFT Results  FVC-Pre L 2.71  2.88   FVC-Predicted Pre % 61  78   FVC-Post L 2.57  2.77   FVC-Predicted Post % 58  75   Pre FEV1/FVC % % 88  87   Post FEV1/FCV % % 84  88   FEV1-Pre L 2.38  2.50   FEV1-Predicted Pre % 70  85   FEV1-Post L 2.15  2.44   DLCO uncorrected ml/min/mmHg 19.61  20.91   DLCO UNC% % 76  80   DLCO corrected ml/min/mmHg 19.61  20.91   DLCO COR %Predicted % 76  80   DLVA Predicted % 111  113   TLC L 5.27  4.18   TLC % Predicted % 82  65   RV % Predicted % 128  67   Personally reviewed interpreted spirometry  suggestive of moderate restriction versus air trapping.  No bronchodilator response.  Lung volumes consistent with air trapping.  DLCO is within normal limits and likely underestimated given volume used to calculate this is more than 20% less than TLC measured  WALK:      No data to display          Imaging: Personally reviewed and as per EMR discussion in this note ECHOCARDIOGRAM LIMITED Result Date: 10/25/2024    ECHOCARDIOGRAM LIMITED REPORT   Patient Name:   Travis Rollins Date of Exam: 10/25/2024 Medical Rec #:  [id number]          Height:       66.0 in Accession #:    [id number]         Weight:       245.1 lb Date of Birth:  Oct 16, 1966           BSA:          2.180 m Patient Age:    58 years           BP:           122/64 mmHg Patient Gender: M                  HR:           80 bpm. Exam Location:  [facility] Procedure: Limited Echo, Limited Color Doppler and Cardiac Doppler (Both            Spectral and Color Flow Doppler were utilized during procedure). Indications:    Follow up Pulmonary Embolus I26.09  History:         Patient has prior history of Echocardiogram examinations, most                 recent 07/13/2024. Risk Factors:Hypertension, Diabetes and                 Dyslipidemia.  Sonographer:    Augustin Seals RDCS Referring Phys: [id number] Raynell Scott R Cherisse Carrell IMPRESSIONS  1. Limited echo  2. Left ventricular ejection fraction, by estimation, is 55 to 60%. Left ventricular ejection fraction by PLAX is 55 %. The left ventricle has normal function. The left ventricle has no regional wall motion abnormalities. Left ventricular diastolic parameters were normal.  3. Reduced RV free wall strain (-14.4%). Right ventricular systolic function is normal. The right ventricular size is normal. There is normal pulmonary artery systolic pressure. The estimated right ventricular systolic pressure is 23.2 mmHg.  4. The mitral valve is grossly normal. Trivial mitral valve regurgitation.  5. The aortic valve is tricuspid. Aortic valve regurgitation is mild.  6. The inferior vena cava is normal in size with greater than 50% respiratory variability, suggesting right atrial pressure of 3 mmHg. Comparison(s): Changes from prior study are noted. 07/13/2024: LVEF 45-50%, mild RV systolic dysfunction. FINDINGS  Left Ventricle: Left ventricular ejection fraction, by estimation, is 55 to 60%. Left ventricular ejection fraction by PLAX is 55 %. The left ventricle has normal function. The left ventricle has no regional wall motion abnormalities. The left ventricular internal cavity size was normal in size. There is no left ventricular hypertrophy. Left ventricular diastolic parameters were normal. Right Ventricle: Reduced RV free wall strain (-14.4%). The right ventricular size is normal. No increase in right ventricular wall thickness. Right ventricular systolic function is normal. There is normal pulmonary artery systolic pressure. The tricuspid  regurgitant velocity is 2.25 m/s, and with an assumed right atrial pressure  of 3 mmHg, the estimated right  ventricular systolic pressure is 23.2 mmHg. Left Atrium: Left atrial size was normal in size. Right Atrium: Right atrial size was normal in size. Mitral Valve: The mitral valve is grossly normal. Trivial mitral valve regurgitation. Aortic Valve: The aortic valve is tricuspid. Aortic valve regurgitation is mild. Aortic regurgitation PHT measures 369 msec. Pulmonic Valve: The pulmonic valve was grossly normal. Pulmonic valve regurgitation is trivial. Venous: The inferior vena cava is normal in size with greater than 50% respiratory variability, suggesting right atrial pressure of 3 mmHg. LEFT VENTRICLE PLAX 2D LV EF:         Left            Diastology                ventricular     LV e' medial:    7.71 cm/s                ejection        LV E/e' medial:  10.3                fraction by     LV e' lateral:   9.02 cm/s                PLAX is 55      LV E/e' lateral: 8.8                %. LVIDd:         4.90 cm LVIDs:         3.50 cm LV PW:         1.00 cm LV IVS:        0.80 cm LVOT diam:     2.50 cm LVOT Area:     4.91 cm  RIGHT VENTRICLE             IVC RV Basal diam:  3.20 cm     IVC diam: 1.80 cm RV Mid diam:    2.10 cm RV S prime:     13.80 cm/s TAPSE (M-mode): 2.0 cm LEFT ATRIUM         Index       RIGHT ATRIUM           Index LA diam:    3.40 cm 1.56 cm/m  RA Area:     13.70 cm                                 RA Volume:   33.30 ml  15.27 ml/m  AORTIC VALVE AI PHT:      369 msec  AORTA Ao Root diam: 3.30 cm MITRAL VALVE               TRICUSPID VALVE MV Area (PHT): 4.10 cm    TR Peak grad:   20.2 mmHg MV Decel Time: 185 msec    TR Vmax:        225.00 cm/s MV E velocity: 79.15 cm/s MV A velocity: 95.90 cm/s  SHUNTS MV E/A ratio:  0.83        Systemic Diam: 2.50 cm Vinie Maxcy MD Electronically signed by Vinie Maxcy MD Signature Date/Time: 10/25/2024/3:04:35 PM    Final     Lab Results: Personally reviewed CBC    Component Value Date/Time   WBC 3.5 (L) 10/11/2024 1525   RBC 4.20 (L) 10/11/2024 1525    HGB  13.7 10/11/2024 1525   HGB 12.5 (L) 09/07/2024 0845   HCT 42.3 10/11/2024 1525   PLT 111.0 (L) 10/11/2024 1525   PLT 104 (L) 09/07/2024 0845   MCV 100.9 (H) 10/11/2024 1525   MCH 32.0 09/07/2024 0845   MCHC 32.3 10/11/2024 1525   RDW 17.3 (H) 10/11/2024 1525   LYMPHSABS 1.2 10/11/2024 1525   MONOABS 0.5 10/11/2024 1525   EOSABS 0.2 10/11/2024 1525   BASOSABS 0.0 10/11/2024 1525    BMET    Component Value Date/Time   NA 141 10/11/2024 1525   K 4.3 10/11/2024 1525   CL 109 10/11/2024 1525   CO2 26 10/11/2024 1525   GLUCOSE 96 10/11/2024 1525   BUN 21 10/11/2024 1525   CREATININE 1.26 10/11/2024 1525   CREATININE 0.93 09/07/2024 0845   CALCIUM  8.9 10/11/2024 1525   GFRNONAA >60 09/07/2024 0845   GFRAA 52 (L) 03/06/2016 1335    BNP    Component Value Date/Time   BNP 12.0 07/12/2024 1858    ProBNP    Component Value Date/Time   PROBNP 578.6 (H) 02/25/2014 0108    Specialty Problems       Pulmonary Problems   Acute respiratory failure with hypoxia (HCC)    Allergies  Allergen Reactions   Penicillins Hives    Has patient had a PCN reaction causing immediate rash, facial/tongue/throat swelling, SOB or lightheadedness with hypotension: No Has patient had a PCN reaction causing severe rash involving mucus membranes or skin necrosis: No Has patient had a PCN reaction that required hospitalization No Has patient had a PCN reaction occurring within the last 10 years: No If all of the above answers are NO, then may proceed with Cephalosporin use.    Immunization History  Administered Date(s) Administered   Hepb-cpg 10/11/2024, 11/14/2024   Influenza-Unspecified 09/10/2023    Past Medical History:  Diagnosis Date   Anemia    Carpal tunnel syndrome    Deep vein thrombosis (HCC) 07/2013, 10/2014, 03/16/2015   LLE (studies at [facility]   Depression    Diabetes mellitus    Hyperlipidemia    Hypertension    Obesity    Osteoarthritis     Peripheral vascular disease    6 yrs ago, DVT in Lt knee/ groin   Pulmonary embolism (HCC)    hx of x 3 per patient; most recent 02/2014   Sinusitis    Sleep apnea     Tobacco History: Social History   Tobacco Use  Smoking Status Never   Passive exposure: Never  Smokeless Tobacco Never   Counseling given: Not Answered   Continue to not smoke  Outpatient Encounter Medications as of 11/17/2024  Medication Sig   atorvastatin  (LIPITOR) 80 MG tablet Take 80 mg by mouth daily.   Febuxostat 80 MG TABS Take 1 tablet by mouth daily.   furosemide  (LASIX ) 40 MG tablet Take 40 mg by mouth every morning.    lisinopril  (PRINIVIL ,ZESTRIL ) 2.5 MG tablet Take 2.5 mg by mouth daily.   metoprolol  succinate (TOPROL -XL) 50 MG 24 hr tablet Take 50 mg by mouth at bedtime. Take with or immediately following a meal.   MOUNJARO 5 MG/0.5ML Pen SMARTSIG:0.5 Milliliter(s) SUB-Q Once a Week   potassium chloride  (K-DUR) 10 MEQ tablet Take 10 mEq by mouth at bedtime.    SYNJARDY XR 12.04-999 MG TB24 Take 1 tablet by mouth 2 (two) times daily.   tamsulosin  (FLOMAX ) 0.4 MG CAPS capsule Take 0.4 mg by mouth daily after breakfast.  topiramate  (TOPAMAX ) 50 MG tablet Take 50 mg by mouth 2 (two) times daily.   TRESIBA FLEXTOUCH 200 UNIT/ML SOPN Inject 26 Units into the skin at bedtime.   warfarin (COUMADIN ) 4 MG tablet Take 2 tablets (8 mg total) by mouth daily.   [DISCONTINUED] Capsicum, Cayenne, 455 MG CAPS Take 2 tablets by mouth in the morning and at bedtime.   [DISCONTINUED] co-enzyme Q-10 30 MG capsule Take 30 mg by mouth daily.   [DISCONTINUED] ezetimibe  (ZETIA ) 10 MG tablet Take 10 mg by mouth daily.   [DISCONTINUED] MOUNJARO 2.5 MG/0.5ML Pen Inject 2.5 mg into the skin once a week.   [DISCONTINUED] allopurinol (ZYLOPRIM) 100 MG tablet Take 100 mg by mouth daily. (Patient not taking: Reported on 11/11/2024)   [DISCONTINUED] L-ARGININE PO Take 1 tablet by mouth daily.   [DISCONTINUED]  Magnesium 200 MG CHEW Chew 2 capsules by mouth in the morning and at bedtime. (Patient not taking: Reported on 11/11/2024)   [DISCONTINUED] omega-3 acid ethyl esters (LOVAZA) 1 g capsule Take 1 g by mouth daily. (Patient not taking: Reported on 11/11/2024)   No facility-administered encounter medications on file as of 11/17/2024.     Review of Systems  Review of Systems  No chest pain no orthopnea or PND comprehensive review of systems otherwise negative Physical Exam  BP 128/82   Pulse 85   Temp 97.6 F (36.4 C) (Oral)   Ht 5' 6 (1.676 m)   Wt 238 lb 9.6 oz (108.2 kg)   SpO2 100%   BMI 38.51 kg/m   Wt Readings from Last 5 Encounters:  11/17/24 238 lb 9.6 oz (108.2 kg)  10/11/24 245 lb 2 oz (111.2 kg)  09/13/24 239 lb (108.4 kg)  09/07/24 241 lb 1.3 oz (109.4 kg)  08/12/24 240 lb (108.9 kg)    BMI Readings from Last 5 Encounters:  11/17/24 38.51 kg/m  10/11/24 39.56 kg/m  09/13/24 38.58 kg/m  09/07/24 38.91 kg/m  08/12/24 38.74 kg/m     Physical Exam General: Sitting in chair, no acute distress Eyes: EOMI, icterus Neck: Supple, no JVP Pulmonary: Distant, normal work of breathing, clear Cardiovascular: Regular rate and rhythm, no murmur Abdomen: Nondistended.  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  07/2024 recurrence with INR level 1.3.  He states PCP is managing this.  He states he has upcoming appoint with hematology.  Will repeat CTA and echocardiogram at 38-month interval.  Likely will need referral to [facility] if there is ongoing abnormalities.  Cough: Comes and goes.  Productive sputum.  Air trapping on recent PFTs.  Suspect possible element of cough variant asthma.  Symptom burden is low does not warrant treatment at this  time.  Consider ICS/LABA therapy in the future if worsens.  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  Repeat TTE 10/2024 and referred to [facility] if recurrent or ongoing RV dysfunction.   No follow-ups on file.   Donnice JONELLE Beals, MD 11/17/2024   This appointment required 42 minutes of patient care (this includes precharting, chart review, review of results, face-to-face care, etc.).

## 2024-11-17 NOTE — Progress Notes (Signed)
 @Patient  ID: Travis Rollins, male    DOB: [birth year], 58 y.o.   MRN: [id number]  Chief Complaint  Patient presents with   pulmonary embolism    Follow up cta,echo    Referring provider: [name], NP  HPI:   58 y.o. 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  Here for additional follow up.   Overall doing well.  No dyspnea.  Repeat TTE at 24-month interval demonstrates resolution of RV dysfunction.  CTA was done earlier that showed small weblike defect left lower lobe pulmonary artery distal.  Given resolution of RV dysfunction and small area, we discussed no further intervention is recommended.    HPI at initial visit: Patient with multiple PEs for send 2005.  Was on warfarin and Xarelto.  Another PE on anticoagulation and 2015.  IVC filter placed.  Sounds like transition to Eliquis at that time.  Unfortunately had another PE 2022 on Eliquis.  Decision was made to keep him on warfarin with a higher INR goal of 3-3.5.  He states his PCP is monitoring this and managing this.  I see no record of this I can view PCP records.  Has not seen his hematologist in over 2 years.  He does report dose changes over time.  But he cannot tell me what his most recent INR was when asked.  TTE 05/2021 at time of most recent PE with normal RV size and function, normal RA size, reassuring with trivial to no tricuspid regurgitation.  Questionaires / Pulmonary Flowsheets:   ACT:      No data to display          MMRC:     No data to display          Epworth:      No data to display          Tests:   FENO:  No results found for: NITRICOXIDE  PFT:    Latest Ref Rng & Units 09/15/2023   12:55 PM 08/23/2021   11:36 AM  PFT Results  FVC-Pre L 2.71  2.88   FVC-Predicted Pre % 61  78    FVC-Post L 2.57  2.77   FVC-Predicted Post % 58  75   Pre FEV1/FVC % % 88  87   Post FEV1/FCV % % 84  88   FEV1-Pre L 2.38  2.50   FEV1-Predicted Pre % 70  85   FEV1-Post L 2.15  2.44   DLCO uncorrected ml/min/mmHg 19.61  20.91   DLCO UNC% % 76  80   DLCO corrected ml/min/mmHg 19.61  20.91   DLCO COR %Predicted % 76  80   DLVA Predicted % 111  113   TLC L 5.27  4.18   TLC % Predicted % 82  65   RV % Predicted % 128  67   Personally reviewed interpreted spirometry suggestive of moderate restriction versus air trapping.  No bronchodilator response.  Lung volumes consistent with air trapping.  DLCO is within normal limits and likely underestimated given volume used to calculate this is more than 20% less than TLC measured  WALK:      No data to display          Imaging: Personally reviewed and as per EMR discussion in this note ECHOCARDIOGRAM LIMITED Result Date: 10/25/2024  ECHOCARDIOGRAM LIMITED REPORT   Patient Name:   Travis Rollins Date of Exam: 10/25/2024 Medical Rec #:  [id number]          Height:       66.0 in Accession #:    [id number]         Weight:       245.1 lb Date of Birth:  28-Dec-1965           BSA:          2.180 m Patient Age:    58 years           BP:           122/64 mmHg Patient Gender: M                  HR:           80 bpm. Exam Location:  [facility] Procedure: Limited Echo, Limited Color Doppler and Cardiac Doppler (Both            Spectral and Color Flow Doppler were utilized during procedure). Indications:    Follow up Pulmonary Embolus I26.09  History:        Patient has prior history of Echocardiogram examinations, most                 recent 07/13/2024. Risk Factors:Hypertension, Diabetes and                 Dyslipidemia.  Sonographer:    Augustin Seals RDCS Referring Phys: [phone] Omelia Marquart R Kayloni Rocco IMPRESSIONS  1. Limited echo  2. Left ventricular ejection fraction, by estimation, is 55 to 60%. Left ventricular ejection fraction by PLAX is 55 %.  The left ventricle has normal function. The left ventricle has no regional wall motion abnormalities. Left ventricular diastolic parameters were normal.  3. Reduced RV free wall strain (-14.4%). Right ventricular systolic function is normal. The right ventricular size is normal. There is normal pulmonary artery systolic pressure. The estimated right ventricular systolic pressure is 23.2 mmHg.  4. The mitral valve is grossly normal. Trivial mitral valve regurgitation.  5. The aortic valve is tricuspid. Aortic valve regurgitation is mild.  6. The inferior vena cava is normal in size with greater than 50% respiratory variability, suggesting right atrial pressure of 3 mmHg. Comparison(s): Changes from prior study are noted. 07/13/2024: LVEF 45-50%, mild RV systolic dysfunction. FINDINGS  Left Ventricle: Left ventricular ejection fraction, by estimation, is 55 to 60%. Left ventricular ejection fraction by PLAX is 55 %. The left ventricle has normal function. The left ventricle has no regional wall motion abnormalities. The left ventricular internal cavity size was normal in size. There is no left ventricular hypertrophy. Left ventricular diastolic parameters were normal. Right Ventricle: Reduced RV free wall strain (-14.4%). The right ventricular size is normal. No increase in right ventricular wall thickness. Right ventricular systolic function is normal. There is normal pulmonary artery systolic pressure. The tricuspid  regurgitant velocity is 2.25 m/s, and with an assumed right atrial pressure of 3 mmHg, the estimated right ventricular systolic pressure is 23.2 mmHg. Left Atrium: Left atrial size was normal in size. Right Atrium: Right atrial size was normal in size. Mitral Valve: The mitral valve is grossly normal. Trivial mitral valve regurgitation. Aortic Valve: The aortic valve is tricuspid. Aortic valve regurgitation is mild. Aortic regurgitation PHT measures 369 msec. Pulmonic Valve: The pulmonic valve was grossly  normal. Pulmonic valve regurgitation is trivial. Venous: The inferior vena cava is  normal in size with greater than 50% respiratory variability, suggesting right atrial pressure of 3 mmHg. LEFT VENTRICLE PLAX 2D LV EF:         Left            Diastology                ventricular     LV e' medial:    7.71 cm/s                ejection        LV E/e' medial:  10.3                fraction by     LV e' lateral:   9.02 cm/s                PLAX is 55      LV E/e' lateral: 8.8                %. LVIDd:         4.90 cm LVIDs:         3.50 cm LV PW:         1.00 cm LV IVS:        0.80 cm LVOT diam:     2.50 cm LVOT Area:     4.91 cm  RIGHT VENTRICLE             IVC RV Basal diam:  3.20 cm     IVC diam: 1.80 cm RV Mid diam:    2.10 cm RV S prime:     13.80 cm/s TAPSE (M-mode): 2.0 cm LEFT ATRIUM         Index       RIGHT ATRIUM           Index LA diam:    3.40 cm 1.56 cm/m  RA Area:     13.70 cm                                 RA Volume:   33.30 ml  15.27 ml/m  AORTIC VALVE AI PHT:      369 msec  AORTA Ao Root diam: 3.30 cm MITRAL VALVE               TRICUSPID VALVE MV Area (PHT): 4.10 cm    TR Peak grad:   20.2 mmHg MV Decel Time: 185 msec    TR Vmax:        225.00 cm/s MV E velocity: 79.15 cm/s MV A velocity: 95.90 cm/s  SHUNTS MV E/A ratio:  0.83        Systemic Diam: 2.50 cm Vinie Maxcy MD Electronically signed by Vinie Maxcy MD Signature Date/Time: 10/25/2024/3:04:35 PM    Final     Lab Results: Personally reviewed CBC    Component Value Date/Time   WBC 3.5 (L) 10/11/2024 1525   RBC 4.20 (L) 10/11/2024 1525   HGB 13.7 10/11/2024 1525   HGB 12.5 (L) 09/07/2024 0845   HCT 42.3 10/11/2024 1525   PLT 111.0 (L) 10/11/2024 1525   PLT 104 (L) 09/07/2024 0845   MCV 100.9 (H) 10/11/2024 1525   MCH 32.0 09/07/2024 0845   MCHC 32.3 10/11/2024 1525   RDW 17.3 (H) 10/11/2024 1525   LYMPHSABS 1.2 10/11/2024 1525   MONOABS 0.5 10/11/2024 1525   EOSABS 0.2 10/11/2024 1525   BASOSABS 0.0 10/11/2024 1525  BMET    Component Value Date/Time   NA 141 10/11/2024 1525   K 4.3 10/11/2024 1525   CL 109 10/11/2024 1525   CO2 26 10/11/2024 1525   GLUCOSE 96 10/11/2024 1525   BUN 21 10/11/2024 1525   CREATININE 1.26 10/11/2024 1525   CREATININE 0.93 09/07/2024 0845   CALCIUM  8.9 10/11/2024 1525   GFRNONAA >60 09/07/2024 0845   GFRAA 52 (L) 03/06/2016 1335    BNP    Component Value Date/Time   BNP 12.0 07/12/2024 1858    ProBNP    Component Value Date/Time   PROBNP 578.6 (H) 02/25/2014 0108    Specialty Problems       Pulmonary Problems   Acute respiratory failure with hypoxia (HCC)    Allergies  Allergen Reactions   Penicillins Hives    Has patient had a PCN reaction causing immediate rash, facial/tongue/throat swelling, SOB or lightheadedness with hypotension: No Has patient had a PCN reaction causing severe rash involving mucus membranes or skin necrosis: No Has patient had a PCN reaction that required hospitalization No Has patient had a PCN reaction occurring within the last 10 years: No If all of the above answers are NO, then may proceed with Cephalosporin use.    Immunization History  Administered Date(s) Administered   Hepb-cpg 10/11/2024, 11/14/2024   Influenza-Unspecified 09/10/2023    Past Medical History:  Diagnosis Date   Anemia    Carpal tunnel syndrome    Deep vein thrombosis (HCC) 07/2013, 10/2014, 03/16/2015   LLE (studies at [facility])   Depression    Diabetes mellitus    Hyperlipidemia    Hypertension    Obesity    Osteoarthritis    Peripheral vascular disease    6 yrs ago, DVT in Lt knee/ groin   Pulmonary embolism (HCC)    hx of x 3 per patient; most recent 02/2014   Sinusitis    Sleep apnea     Tobacco History: Social History   Tobacco Use  Smoking Status Never   Passive exposure: Never  Smokeless Tobacco Never   Counseling given: Not Answered   Continue to not smoke  Outpatient Encounter Medications as of 11/17/2024   Medication Sig   atorvastatin  (LIPITOR) 80 MG tablet Take 80 mg by mouth daily.   Febuxostat 80 MG TABS Take 1 tablet by mouth daily.   furosemide  (LASIX ) 40 MG tablet Take 40 mg by mouth every morning.    lisinopril  (PRINIVIL ,ZESTRIL ) 2.5 MG tablet Take 2.5 mg by mouth daily.   metoprolol  succinate (TOPROL -XL) 50 MG 24 hr tablet Take 50 mg by mouth at bedtime. Take with or immediately following a meal.   MOUNJARO 5 MG/0.5ML Pen SMARTSIG:0.5 Milliliter(s) SUB-Q Once a Week   potassium chloride  (K-DUR) 10 MEQ tablet Take 10 mEq by mouth at bedtime.    SYNJARDY XR 12.04-999 MG TB24 Take 1 tablet by mouth 2 (two) times daily.   tamsulosin  (FLOMAX ) 0.4 MG CAPS capsule Take 0.4 mg by mouth daily after breakfast.   topiramate  (TOPAMAX ) 50 MG tablet Take 50 mg by mouth 2 (two) times daily.   TRESIBA FLEXTOUCH 200 UNIT/ML SOPN Inject 26 Units into the skin at bedtime.   warfarin (COUMADIN ) 4 MG tablet Take 2 tablets (8 mg total) by mouth daily.   [DISCONTINUED] Capsicum, Cayenne, 455 MG CAPS Take 2 tablets by mouth in the morning and at bedtime.   [DISCONTINUED] co-enzyme Q-10 30 MG capsule Take 30 mg by mouth daily.   [DISCONTINUED] ezetimibe  (ZETIA )  10 MG tablet Take 10 mg by mouth daily.   [DISCONTINUED] MOUNJARO 2.5 MG/0.5ML Pen Inject 2.5 mg into the skin once a week.   [DISCONTINUED] allopurinol (ZYLOPRIM) 100 MG tablet Take 100 mg by mouth daily. (Patient not taking: Reported on 11/11/2024)   [DISCONTINUED] L-ARGININE PO Take 1 tablet by mouth daily.   [DISCONTINUED] Magnesium 200 MG CHEW Chew 2 capsules by mouth in the morning and at bedtime. (Patient not taking: Reported on 11/11/2024)   [DISCONTINUED] omega-3 acid ethyl esters (LOVAZA) 1 g capsule Take 1 g by mouth daily. (Patient not taking: Reported on 11/11/2024)   No facility-administered encounter medications on file as of 11/17/2024.     Review of Systems  Review of Systems  N/a  Physical Exam  BP 128/82   Pulse 85   Temp  97.6 F (36.4 C) (Oral)   Ht 5' 6 (1.676 m)   Wt 238 lb 9.6 oz (108.2 kg)   SpO2 100%   BMI 38.51 kg/m   Wt Readings from Last 5 Encounters:  11/17/24 238 lb 9.6 oz (108.2 kg)  10/11/24 245 lb 2 oz (111.2 kg)  09/13/24 239 lb (108.4 kg)  09/07/24 241 lb 1.3 oz (109.4 kg)  08/12/24 240 lb (108.9 kg)    BMI Readings from Last 5 Encounters:  11/17/24 38.51 kg/m  10/11/24 39.56 kg/m  09/13/24 38.58 kg/m  09/07/24 38.91 kg/m  08/12/24 38.74 kg/m     Physical Exam General: sitting up, no distress Eyes: No icterus Pulmonary: Clear normal work of breathing Cardiovascular: Warm, no edema   Assessment & Plan:   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  07/2024 recurrence with INR level 1.3.  Continues on warfarin.  Recommend lifelong anticoagulation.  Cough: Stable, symptoms are not overwhelming.  Consider inhalers in the future if worsens.  RV dysfunction: Presumed related to acute submassive PE.  Repeat CT at 35-month interval 10/2024 reveals resolution of RV dysfunction, normalization of RV function.  No further workup at this time.   Return in about 1 year (around 11/17/2025) for f/u Dr. Annella.   Donnice JONELLE Annella, MD 11/17/2024

## 2024-11-22 ENCOUNTER — Ambulatory Visit: Attending: Cardiology

## 2024-11-22 DIAGNOSIS — Z7901 Long term (current) use of anticoagulants: Secondary | ICD-10-CM | POA: Diagnosis not present

## 2024-11-22 DIAGNOSIS — I2699 Other pulmonary embolism without acute cor pulmonale: Secondary | ICD-10-CM

## 2024-11-22 DIAGNOSIS — I824Y9 Acute embolism and thrombosis of unspecified deep veins of unspecified proximal lower extremity: Secondary | ICD-10-CM

## 2024-11-22 DIAGNOSIS — I82532 Chronic embolism and thrombosis of left popliteal vein: Secondary | ICD-10-CM

## 2024-11-22 LAB — POCT INR: INR: 4 — AB (ref 2.0–3.0)

## 2024-11-22 NOTE — Progress Notes (Signed)
 Description   INR-4.0; Do not take any warfarin today then REDUCE dose to 1 tablet daily except 2 tablets on Monday, Wednesday, and Friday. Recheck INR in 2 weeks.    Anticoagulation Clinic [phone]

## 2024-11-22 NOTE — Patient Instructions (Addendum)
 Description   INR-4.0; Do not take any warfarin today then REDUCE dose to 1 tablet daily except 2 tablets on Monday, Wednesday, and Friday. Recheck INR in 2 weeks.    Anticoagulation Clinic [phone]

## 2024-12-06 ENCOUNTER — Ambulatory Visit: Admitting: Pharmacist

## 2024-12-06 DIAGNOSIS — I82532 Chronic embolism and thrombosis of left popliteal vein: Secondary | ICD-10-CM | POA: Diagnosis not present

## 2024-12-06 DIAGNOSIS — Z7901 Long term (current) use of anticoagulants: Secondary | ICD-10-CM

## 2024-12-06 DIAGNOSIS — I2699 Other pulmonary embolism without acute cor pulmonale: Secondary | ICD-10-CM

## 2024-12-06 DIAGNOSIS — I824Y9 Acute embolism and thrombosis of unspecified deep veins of unspecified proximal lower extremity: Secondary | ICD-10-CM | POA: Diagnosis not present

## 2024-12-06 LAB — POCT INR: INR: 3 (ref 2.0–3.0)

## 2024-12-06 NOTE — Progress Notes (Signed)
 Description   INR-3.0; Continue 1 tablet daily except 2 tablets on Monday, Wednesday, and Friday. Recheck INR in 3 weeks.    [facility]ticoagulation Clinic [phone]

## 2024-12-06 NOTE — Patient Instructions (Signed)
 Description   INR-3.0; Continue 1 tablet daily except 2 tablets on Monday, Wednesday, and Friday. Recheck INR in 3 weeks.    Anticoagulation Clinic [phone]

## 2024-12-27 ENCOUNTER — Ambulatory Visit: Attending: Cardiology | Admitting: Pharmacist

## 2024-12-27 DIAGNOSIS — I824Y9 Acute embolism and thrombosis of unspecified deep veins of unspecified proximal lower extremity: Secondary | ICD-10-CM | POA: Diagnosis not present

## 2024-12-27 DIAGNOSIS — I2699 Other pulmonary embolism without acute cor pulmonale: Secondary | ICD-10-CM | POA: Diagnosis not present

## 2024-12-27 DIAGNOSIS — Z7901 Long term (current) use of anticoagulants: Secondary | ICD-10-CM | POA: Diagnosis not present

## 2024-12-27 DIAGNOSIS — I82532 Chronic embolism and thrombosis of left popliteal vein: Secondary | ICD-10-CM | POA: Diagnosis not present

## 2024-12-27 LAB — POCT INR: INR: 1.9 — AB (ref 2.0–3.0)

## 2024-12-27 NOTE — Progress Notes (Signed)
 Description   INR-1.9; Take 2 tablets today and the continue 1 tablet daily except 2 tablets on Monday, Wednesday, and Friday. Recheck INR in 2 weeks.    Anticoagulation Clinic [phone]

## 2024-12-27 NOTE — Patient Instructions (Signed)
 Description   INR-1.9; Take 2 tablets today and the continue 1 tablet daily except 2 tablets on Monday, Wednesday, and Friday. Recheck INR in 2 weeks.    Anticoagulation Clinic [phone]

## 2024-12-30 ENCOUNTER — Inpatient Hospital Stay: Attending: Medical Oncology | Admitting: Oncology

## 2024-12-30 ENCOUNTER — Inpatient Hospital Stay

## 2024-12-30 VITALS — BP 133/83 | HR 81 | Temp 97.6°F | Resp 17 | Wt 246.1 lb

## 2024-12-30 DIAGNOSIS — E119 Type 2 diabetes mellitus without complications: Secondary | ICD-10-CM | POA: Diagnosis not present

## 2024-12-30 DIAGNOSIS — Z7901 Long term (current) use of anticoagulants: Secondary | ICD-10-CM | POA: Insufficient documentation

## 2024-12-30 DIAGNOSIS — Z86711 Personal history of pulmonary embolism: Secondary | ICD-10-CM | POA: Insufficient documentation

## 2024-12-30 DIAGNOSIS — I10 Essential (primary) hypertension: Secondary | ICD-10-CM | POA: Diagnosis not present

## 2024-12-30 DIAGNOSIS — D696 Thrombocytopenia, unspecified: Secondary | ICD-10-CM

## 2024-12-30 DIAGNOSIS — E785 Hyperlipidemia, unspecified: Secondary | ICD-10-CM | POA: Diagnosis not present

## 2024-12-30 DIAGNOSIS — I82532 Chronic embolism and thrombosis of left popliteal vein: Secondary | ICD-10-CM | POA: Insufficient documentation

## 2024-12-30 DIAGNOSIS — I2699 Other pulmonary embolism without acute cor pulmonale: Secondary | ICD-10-CM

## 2024-12-30 DIAGNOSIS — D539 Nutritional anemia, unspecified: Secondary | ICD-10-CM

## 2024-12-30 LAB — CMP (CANCER CENTER ONLY)
ALT: 37 U/L (ref 0–44)
AST: 33 U/L (ref 15–41)
Albumin: 4.2 g/dL (ref 3.5–5.0)
Alkaline Phosphatase: 117 U/L (ref 38–126)
Anion gap: 11 (ref 5–15)
BUN: 17 mg/dL (ref 6–20)
CO2: 23 mmol/L (ref 22–32)
Calcium: 9.3 mg/dL (ref 8.9–10.3)
Chloride: 109 mmol/L (ref 98–111)
Creatinine: 1.05 mg/dL (ref 0.61–1.24)
GFR, Estimated: >60 mL/min
Glucose, Bld: 127 mg/dL — ABNORMAL HIGH (ref 70–99)
Potassium: 4.1 mmol/L (ref 3.5–5.1)
Sodium: 142 mmol/L (ref 135–145)
Total Bilirubin: 0.5 mg/dL (ref 0.0–1.2)
Total Protein: 7.9 g/dL (ref 6.5–8.1)

## 2024-12-30 LAB — LACTATE DEHYDROGENASE: LDH: 211 U/L (ref 105–235)

## 2024-12-30 LAB — CBC WITH DIFFERENTIAL (CANCER CENTER ONLY)
Abs Immature Granulocytes: 0 K/uL (ref 0.00–0.07)
Basophils Absolute: 0 K/uL (ref 0.0–0.1)
Basophils Relative: 0 %
Eosinophils Absolute: 0.1 K/uL (ref 0.0–0.5)
Eosinophils Relative: 3 %
HCT: 46.3 % (ref 39.0–52.0)
Hemoglobin: 15.1 g/dL (ref 13.0–17.0)
Immature Granulocytes: 0 %
Lymphocytes Relative: 36 %
Lymphs Abs: 1.2 K/uL (ref 0.7–4.0)
MCH: 31.8 pg (ref 26.0–34.0)
MCHC: 32.6 g/dL (ref 30.0–36.0)
MCV: 97.5 fL (ref 80.0–100.0)
Monocytes Absolute: 0.5 K/uL (ref 0.1–1.0)
Monocytes Relative: 15 %
Neutro Abs: 1.6 K/uL — ABNORMAL LOW (ref 1.7–7.7)
Neutrophils Relative %: 46 %
Platelet Count: 96 K/uL — ABNORMAL LOW (ref 150–400)
RBC: 4.75 MIL/uL (ref 4.22–5.81)
RDW: 15.4 % (ref 11.5–15.5)
WBC Count: 3.4 K/uL — ABNORMAL LOW (ref 4.0–10.5)
nRBC: 0 % (ref 0.0–0.2)

## 2024-12-30 LAB — IRON AND IRON BINDING CAPACITY (CC-WL,HP ONLY)
Iron: 110 ug/dL (ref 45–182)
Saturation Ratios: 27 % (ref 17.9–39.5)
TIBC: 403 ug/dL (ref 250–450)
UIBC: 293 ug/dL

## 2024-12-30 LAB — FERRITIN: Ferritin: 98 ng/mL (ref 24–336)

## 2024-12-30 LAB — IMMATURE PLATELET FRACTION: Immature Platelet Fraction: 2.9 % (ref 1.2–8.6)

## 2024-12-30 NOTE — Progress Notes (Signed)
 "   [facility]  HEMATOLOGY CLINIC CONSULTATION NOTE   PATIENT NAME: Travis Rollins   MR#: [id number] DOB: 12-18-1965  DATE OF SERVICE: 12/30/2024   Patient Care Team: [name], NP as PCP - General Patwardhan, Newman PARAS, MD as PCP - Cardiology (Cardiology) Jadine Aline BRAVO, PA-C as Physician Assistant (Cardiology)   REASON FOR CONSULTATION/ CHIEF COMPLAINT:  Recurrent thromboembolism, chronic DVT, chronic thrombocytopenia  ASSESSMENT & PLAN:  Travis Rollins is a 59 y.o. gentleman with a past medical history of recurrent thromboembolism, with past incidence of pulmonary embolism in 2005, chronic DVT, hypertension, dyslipidemia, peripheral vascular disease, sleep apnea was referred to our service for continuation of care for his chronic thrombocytopenia, recurrent thromboembolism..  Bilateral pulmonary embolism (HCC) Recurrent, severe bilateral pulmonary embolism, most recently in August 2025 requiring hospitalization, arterial thrombectomy, and IVC filter replacement.   He had extensive thrombophilia workup previously.  There was concern for lupus anticoagulant positivity.  Regardless, given recurrent thromboembolism, he is on lifelong anticoagulation.  He is managed with long-term warfarin anticoagulation.   He tolerates warfarin without bleeding complications and undergoes regular INR monitoring at Coumadin  clinic.   Currently he is taking warfarin 8 mg on Mondays Wednesdays, Fridays and 4 mg on rest of the days.  INR has been mostly therapeutic in recent months.  Previous anticoagulant trials included apixaban and rivaroxaban, with rivaroxaban discontinued due to severe gastrointestinal bleeding. He has also received enoxaparin  post-thrombotic events until INR stabilization. No current symptoms of chest pain, dyspnea, dizziness, or lightheadedness.  - Continue long-term warfarin anticoagulation with ongoing Coumadin  clinic follow-up and INR  monitoring.  - Follow up in three months for ongoing management.  Chronic deep vein thrombosis (DVT) of popliteal vein of left lower extremity (HCC) Chronic DVT of the left popliteal vein with multiple thrombotic events since 2005. He remains on long-term anticoagulation due to recurrent events and persistent risk factors. No new symptoms reported. - Continue long-term warfarin anticoagulation as per current regimen. - Follow up in three months.  Thrombocytopenia Mild, chronic thrombocytopenia on laboratory evaluation. Prior B12 and folic acid  levels were normal. Etiology remains unclear; further evaluation is warranted given ongoing anticoagulation therapy.  -Labs today showed stable platelet count of 96,000.  White count 3400 with normal differential, ANC 1600.  Iron studies, LDH, CMP unremarkable.  Immature platelet fraction is normal at 2.9%.    It is okay to continue anticoagulation as long as platelet count is above 50,000.  - Defer repeat B12 and folic acid  testing as prior results were normal.  - Follow up in three months to review laboratory results and reassess management.   I reviewed lab results and outside records for this visit and discussed relevant results with the patient. Diagnosis, plan of care and treatment options were also discussed in detail with the patient. Opportunity provided to ask questions and answers provided to his apparent satisfaction. Provided instructions to call our clinic with any problems, questions or concerns prior to return visit. I recommended to continue follow-up with PCP and sub-specialists. He verbalized understanding and agreed with the plan. No barriers to learning was detected.  Chinita Patten, MD  12/30/2024 10:36 AM  [facility] - A DEPT OF [facility] [address] [place] [address] Dept: [phone] Dept Fax: [phone]   HISTORY OF PRESENT  ILLNESS:   Discussed the use of AI scribe software for clinical note transcription with the  patient, who gave verbal consent to proceed.  History of Present Illness Travis Rollins is a 59 year old male with recurrent venous thromboembolism, chronic left popliteal DVT, thrombocytopenia, macrocytic anemia, and an IVC filter who presents for hematology follow-up after a severe pulmonary embolism and arterial thrombectomy in August 2025.  He has experienced four thrombotic events since 2005, with the most recent and most severe episode occurring on July 11, 2024, characterized by acute visual changes and necessitating a ten-day hospitalization. During this admission, he underwent arterial thrombectomy and was treated with heparin . Imaging on July 07, 2024, demonstrated only chronic thrombus, but five days later he developed new, severe symptoms leading to identification and removal of new clots. He has had two IVC filters placed, the first in 2015 and a replacement following the August 2025 event.  He is currently managed with warfarin, attending a Coumadin  clinic every few weeks for INR monitoring. His regimen consists of eight milligrams on Monday, Wednesday, and Friday, and four milligrams on other days, adjusted per INR results. His most recent INR was 1.9 last year, and he has a follow-up scheduled for January 11, 2025. He has not experienced bleeding complications with the current regimen. He previously trialed apixaban and rivaroxaban, but developed severe gastrointestinal bleeding with rivaroxaban, described as hematochezia and melena with significant pain, resulting in an emergency department visit and discontinuation of the medication. He has also received enoxaparin  injections after each thrombotic event, typically for several weeks until INR stabilization.  Prior evaluation included normal B12 and folic acid  levels. He previously took iron supplementation for anemia but has discontinued it.  He denies chest pain, dyspnea, dizziness, or lightheadedness. He does not use tobacco, alcohol, or illicit drugs.  He underwent colonoscopy in 2019, which revealed a small polyp and internal hemorrhoids. He was scheduled for repeat colonoscopy in five years, which has not yet occurred, possibly due to his clotting history. He is scheduled to see his cardiologist and gastroenterologist next month for ongoing care coordination.    MEDICAL HISTORY Past Medical History:  Diagnosis Date   Anemia    Carpal tunnel syndrome    Deep vein thrombosis (HCC) 07/2013, 10/2014, 03/16/2015   LLE (studies at [facility])   Depression    Diabetes mellitus    Hyperlipidemia    Hypertension    Obesity    Osteoarthritis    Peripheral vascular disease    6 yrs ago, DVT in Lt knee/ groin   Pulmonary embolism (HCC)    hx of x 3 per patient; most recent 02/2014   Sinusitis    Sleep apnea      SURGICAL HISTORY Past Surgical History:  Procedure Laterality Date   CARDIAC CATHETERIZATION N/A 08/10/2015   Procedure: Left Heart Cath and Coronary Angiography;  Surgeon: Gordy Bergamo, MD;  Location: [facility] INVASIVE CV LAB;  Service: Cardiovascular;  Laterality: N/A;   CYSTOSCOPY WITH RETROGRADE PYELOGRAM, URETEROSCOPY AND STENT PLACEMENT Bilateral 01/03/2015   Procedure: CYSTOSCOPY WITH RETROGRADE PYELOGRAM, RIGHT URETEROSCOPY AND RIGHT STENT PLACEMENT;  Surgeon: Ricardo Likens, MD;  Location: [facility];  Service: Urology;  Laterality: Bilateral;   CYSTOSCOPY WITH RETROGRADE PYELOGRAM, URETEROSCOPY AND STENT PLACEMENT Right 03/12/2016   Procedure: CYSTOSCOPY WITH RETROGRADE PYELOGRAM, URETEROSCOPY AND STENT PLACEMENT;  Surgeon: Ricardo Likens, MD;  Location: [facility];  Service: Urology;  Laterality: Right;   ENDOVENOUS ABLATION SAPHENOUS VEIN W/ LASER Left 04/20/2018   endovenous laser ablation left greater saphenous vein by Lynwood Collum MD    HOLMIUM LASER APPLICATION Right 01/03/2015  Procedure: HOLMIUM LASER APPLICATION;  Surgeon:  Ricardo Likens, MD;  Location: [facility];  Service: Urology;  Laterality: Right;   HOLMIUM LASER APPLICATION Right 03/12/2016   Procedure: HOLMIUM LASER APPLICATION;  Surgeon: Ricardo Likens, MD;  Location: [facility];  Service: Urology;  Laterality: Right;   IR ANGIOGRAM PULMONARY BILATERAL SELECTIVE  07/12/2024   IR ANGIOGRAM SELECTIVE EACH ADDITIONAL VESSEL  07/12/2024   IR ANGIOGRAM SELECTIVE EACH ADDITIONAL VESSEL  07/12/2024   IR IVC FILTER PLMT / S&I /IMG GUID/MOD SED  07/12/2024   IR IVC FILTER RETRIEVAL / S&I /IMG GUID/MOD SED  07/12/2024   IR THROMBECT PRIM MECH ADD (INCLU) MOD SED  07/12/2024   IR THROMBECT PRIM MECH ADD (INCLU) MOD SED  07/12/2024   IR THROMBECT PRIM MECH INIT (INCLU) MOD SED  07/12/2024   IR US  GUIDE VASC ACCESS RIGHT  07/12/2024   IR US  GUIDE VASC ACCESS RIGHT  07/12/2024   IR VENOCAVAGRAM IVC  07/12/2024   IVC filter   07/2014    TONSILLECTOMY     tubes removd from ears       SOCIAL HISTORY: He reports that he has never smoked. He has never been exposed to tobacco smoke. He has never used smokeless tobacco. He reports that he does not drink alcohol and does not use drugs. Social History   Socioeconomic History   Marital status: Single    Spouse name: Not on file   Number of children: 0   Years of education: Not on file   Highest education level: Not on file  Occupational History   Not on file  Tobacco Use   Smoking status: Never    Passive exposure: Never   Smokeless tobacco: Never  Vaping Use   Vaping status: Never Used  Substance and Sexual Activity   Alcohol use: Never   Drug use: No   Sexual activity: Yes    Birth control/protection: Condom  Other Topics Concern   Not on file  Social History Narrative   Not on file   Social Drivers of Health   Tobacco Use: Low Risk (11/17/2024)   Patient History    Smoking Tobacco Use: Never    Smokeless Tobacco Use: Never    Passive Exposure: Never  Financial Resource Strain: Not on file  Food Insecurity: No Food Insecurity  (12/30/2024)   Epic    Worried About Programme Researcher, Broadcasting/film/video in the Last Year: Never true    Ran Out of Food in the Last Year: Never true  Transportation Needs: No Transportation Needs (12/30/2024)   Epic    Lack of Transportation (Medical): No    Lack of Transportation (Non-Medical): No  Physical Activity: Not on file  Stress: Not on file  Social Connections: Not on file  Intimate Partner Violence: Not At Risk (12/30/2024)   Epic    Fear of Current or Ex-Partner: No    Emotionally Abused: No    Physically Abused: No    Sexually Abused: No  Depression (PHQ2-9): Low Risk (12/30/2024)   Depression (PHQ2-9)    PHQ-2 Score: 0  Alcohol Screen: Not on file  Housing: Low Risk (12/30/2024)   Epic    Unable to Pay for Housing in the Last Year: No    Number of Times Moved in the Last Year: 0    Homeless in the Last Year: No  Utilities: Not At Risk (12/30/2024)   Epic    Threatened with loss of utilities: No  Health Literacy: Not on file  FAMILY HISTORY: His family history includes Diabetes in his father; Kidney disease in his father.  CURRENT MEDICATIONS   Current Outpatient Medications  Medication Instructions   atorvastatin  (LIPITOR) 80 mg, Daily   Febuxostat 80 MG TABS 1 tablet, Daily   furosemide  (LASIX ) 40 mg, Every morning   lisinopril  (ZESTRIL ) 2.5 mg, Daily   metoprolol  succinate (TOPROL -XL) 50 mg, Daily at bedtime   MOUNJARO 5 MG/0.5ML Pen SMARTSIG:0.5 Milliliter(s) SUB-Q Once a Week   potassium chloride  (K-DUR) 10 MEQ tablet 10 mEq, Daily at bedtime   SYNJARDY XR 12.04-999 MG TB24 1 tablet, 2 times daily   tamsulosin  (FLOMAX ) 0.4 mg, Daily after breakfast   topiramate  (TOPAMAX ) 50 mg, 2 times daily   Tresiba FlexTouch 26 Units, Daily at bedtime   warfarin (COUMADIN ) 8 mg, Oral, Daily     ALLERGIES  He is allergic to penicillins.  REVIEW OF SYSTEMS:  Review of Systems - Oncology   Rest of the pertinent review of systems is unremarkable except as mentioned above in  HPI.  PHYSICAL EXAMINATION:   Onc Performance Status - 12/30/24 1024       ECOG Perf Status   ECOG Perf Status Restricted in physically strenuous activity but ambulatory and able to carry out work of a light or sedentary nature, e.g., light house work, office work      KPS SCALE   KPS % SCORE Able to carry on normal activity, minor s/s of disease          Vitals:   12/30/24 1013  BP: 133/83  Pulse: 81  Resp: 17  Temp: 97.6 F (36.4 C)  SpO2: 97%   Filed Weights   12/30/24 1013  Weight: 246 lb 1 oz (111.6 kg)    Physical Exam Constitutional:      General: He is not in acute distress.    Appearance: Normal appearance.  HENT:     Head: Normocephalic and atraumatic.  Eyes:     Conjunctiva/sclera: Conjunctivae normal.  Cardiovascular:     Rate and Rhythm: Normal rate and regular rhythm.     Heart sounds: Normal heart sounds.  Pulmonary:     Effort: Pulmonary effort is normal. No respiratory distress.     Breath sounds: Normal breath sounds.  Abdominal:     General: There is no distension.  Neurological:     General: No focal deficit present.     Mental Status: He is alert and oriented to person, place, and time.  Psychiatric:        Mood and Affect: Mood normal.        Behavior: Behavior normal.     LABORATORY DATA:   I have reviewed the data as listed.  Results for orders placed or performed in visit on 12/30/24  Immature Platelet Fraction  Result Value Ref Range   Immature Platelet Fraction 2.9 1.2 - 8.6 %  ANA w/Reflex if Positive  Result Value Ref Range   Anti Nuclear Antibody (ANA) Negative Negative  Ferritin  Result Value Ref Range   Ferritin 98 24 - 336 ng/mL  Iron and Iron Binding Capacity ([facility] only)  Result Value Ref Range   Iron 110 45 - 182 ug/dL   TIBC 596 749 - 549 ug/dL   Saturation Ratios 27 17.9 - 39.5 %   UIBC 293 ug/dL  Lactate dehydrogenase  Result Value Ref Range   LDH 211 105 - 235 U/L  CMP (Cancer Center only)   Result Value Ref Range   Sodium  142 135 - 145 mmol/L   Potassium 4.1 3.5 - 5.1 mmol/L   Chloride 109 98 - 111 mmol/L   CO2 23 22 - 32 mmol/L   Glucose, Bld 127 (H) 70 - 99 mg/dL   BUN 17 6 - 20 mg/dL   Creatinine 8.94 9.38 - 1.24 mg/dL   Calcium  9.3 8.9 - 10.3 mg/dL   Total Protein 7.9 6.5 - 8.1 g/dL   Albumin 4.2 3.5 - 5.0 g/dL   AST 33 15 - 41 U/L   ALT 37 0 - 44 U/L   Alkaline Phosphatase 117 38 - 126 U/L   Total Bilirubin 0.5 0.0 - 1.2 mg/dL   GFR, Estimated >39 >39 mL/min   Anion gap 11 5 - 15  CBC with Differential (Cancer Center Only)  Result Value Ref Range   WBC Count 3.4 (L) 4.0 - 10.5 K/uL   RBC 4.75 4.22 - 5.81 MIL/uL   Hemoglobin 15.1 13.0 - 17.0 g/dL   HCT 53.6 60.9 - 47.9 %   MCV 97.5 80.0 - 100.0 fL   MCH 31.8 26.0 - 34.0 pg   MCHC 32.6 30.0 - 36.0 g/dL   RDW 84.5 88.4 - 84.4 %   Platelet Count 96 (L) 150 - 400 K/uL   nRBC 0.0 0.0 - 0.2 %   Neutrophils Relative % 46 %   Neutro Abs 1.6 (L) 1.7 - 7.7 K/uL   Lymphocytes Relative 36 %   Lymphs Abs 1.2 0.7 - 4.0 K/uL   Monocytes Relative 15 %   Monocytes Absolute 0.5 0.1 - 1.0 K/uL   Eosinophils Relative 3 %   Eosinophils Absolute 0.1 0.0 - 0.5 K/uL   Basophils Relative 0 %   Basophils Absolute 0.0 0.0 - 0.1 K/uL   Immature Granulocytes 0 %   Abs Immature Granulocytes 0.00 0.00 - 0.07 K/uL     RADIOGRAPHIC STUDIES:  I have personally reviewed the radiological images as listed and agreed with the findings in the report.  ECHOCARDIOGRAM LIMITED    ECHOCARDIOGRAM LIMITED REPORT       Patient Name:   TIONNE DAYHOFF Date of Exam: 10/25/2024 Medical Rec #:  [id number]          Height:       66.0 in Accession #:    [id number]         Weight:       245.1 lb Date of Birth:  05/01/1966           BSA:          2.180 m Patient Age:    58 years           BP:           122/64 mmHg Patient Gender: M                  HR:           80 bpm. Exam Location:  [facility]  Procedure: Limited Echo, Limited Color  Doppler and Cardiac Doppler (Both            Spectral and Color Flow Doppler were utilized during procedure).  Indications:    Follow up Pulmonary Embolus I26.09   History:        Patient has prior history of Echocardiogram examinations, most                 recent 07/13/2024. Risk Factors:Hypertension, Diabetes and  Dyslipidemia.   Sonographer:    Augustin Seals RDCS Referring Phys: [phone] MATTHEW R HUNSUCKER  IMPRESSIONS   1. Limited echo  2. Left ventricular ejection fraction, by estimation, is 55 to 60%. Left ventricular ejection fraction by PLAX is 55 %. The left ventricle has normal function. The left ventricle has no regional wall motion abnormalities. Left ventricular diastolic  parameters were normal.  3. Reduced RV free wall strain (-14.4%). Right ventricular systolic function is normal. The right ventricular size is normal. There is normal pulmonary artery systolic pressure. The estimated right ventricular systolic pressure is 23.2 mmHg.  4. The mitral valve is grossly normal. Trivial mitral valve regurgitation.  5. The aortic valve is tricuspid. Aortic valve regurgitation is mild.  6. The inferior vena cava is normal in size with greater than 50% respiratory variability, suggesting right atrial pressure of 3 mmHg.  Comparison(s): Changes from prior study are noted. 07/13/2024: LVEF 45-50%, mild RV systolic dysfunction.  FINDINGS  Left Ventricle: Left ventricular ejection fraction, by estimation, is 55 to 60%. Left ventricular ejection fraction by PLAX is 55 %. The left ventricle has normal function. The left ventricle has no regional wall motion abnormalities. The left  ventricular internal cavity size was normal in size. There is no left ventricular hypertrophy. Left ventricular diastolic parameters were normal.  Right Ventricle: Reduced RV free wall strain (-14.4%). The right ventricular size is normal. No increase in right ventricular wall thickness. Right  ventricular systolic function is normal. There is normal pulmonary artery systolic pressure. The tricuspid  regurgitant velocity is 2.25 m/s, and with an assumed right atrial pressure of 3 mmHg, the estimated right ventricular systolic pressure is 23.2 mmHg.  Left Atrium: Left atrial size was normal in size.  Right Atrium: Right atrial size was normal in size.  Mitral Valve: The mitral valve is grossly normal. Trivial mitral valve regurgitation.  Aortic Valve: The aortic valve is tricuspid. Aortic valve regurgitation is mild. Aortic regurgitation PHT measures 369 msec.  Pulmonic Valve: The pulmonic valve was grossly normal. Pulmonic valve regurgitation is trivial.  Venous: The inferior vena cava is normal in size with greater than 50% respiratory variability, suggesting right atrial pressure of 3 mmHg.  LEFT VENTRICLE PLAX 2D LV EF:         Left            Diastology                ventricular     LV e' medial:    7.71 cm/s                ejection        LV E/e' medial:  10.3                fraction by     LV e' lateral:   9.02 cm/s                PLAX is 55      LV E/e' lateral: 8.8                %. LVIDd:         4.90 cm LVIDs:         3.50 cm LV PW:         1.00 cm LV IVS:        0.80 cm LVOT diam:     2.50 cm LVOT Area:     4.91 cm    RIGHT VENTRICLE  IVC RV Basal diam:  3.20 cm     IVC diam: 1.80 cm RV Mid diam:    2.10 cm RV S prime:     13.80 cm/s TAPSE (M-mode): 2.0 cm  LEFT ATRIUM         Index       RIGHT ATRIUM           Index LA diam:    3.40 cm 1.56 cm/m  RA Area:     13.70 cm                                 RA Volume:   33.30 ml  15.27 ml/m  AORTIC VALVE AI PHT:      369 msec   AORTA Ao Root diam: 3.30 cm  MITRAL VALVE               TRICUSPID VALVE MV Area (PHT): 4.10 cm    TR Peak grad:   20.2 mmHg MV Decel Time: 185 msec    TR Vmax:        225.00 cm/s MV E velocity: 79.15 cm/s MV A velocity: 95.90 cm/s  SHUNTS MV E/A ratio:  0.83         Systemic Diam: 2.50 cm  Vinie Maxcy MD Electronically signed by Vinie Maxcy MD Signature Date/Time: 10/25/2024/3:04:35 PM      Final    Orders Placed This Encounter  Procedures   CBC with Differential (Cancer Center Only)    Standing Status:   Future    Number of Occurrences:   1    Expiration Date:   12/30/2025   CMP (Cancer Center only)    Standing Status:   Future    Number of Occurrences:   1    Expiration Date:   12/30/2025   Lactate dehydrogenase    Standing Status:   Future    Number of Occurrences:   1    Expiration Date:   12/30/2025   Iron and Iron Binding Capacity ([facility] only)    Standing Status:   Future    Number of Occurrences:   1    Expiration Date:   12/30/2025   Ferritin    Standing Status:   Future    Number of Occurrences:   1    Expiration Date:   12/30/2025   ANA w/Reflex if Positive    Standing Status:   Future    Number of Occurrences:   1    Expiration Date:   12/30/2025   Immature Platelet Fraction    Standing Status:   Future    Number of Occurrences:   1    Expiration Date:   12/30/2025    Future Appointments  Date Time Provider Department Center  01/11/2025  2:15 PM [facility]  01/12/2025 11:15 AM Patwardhan, Newman PARAS, MD [facility]  01/17/2025 10:20 AM Mollie Nestor HERO, PA-C [facility]  02/10/2025  1:15 PM Christine Rush, DPM [facility]  03/30/2025 11:15 AM [facility] None  03/30/2025 11:45 AM Lucius Wise, Chinita, MD [facility] None    I spent a total of 55 minutes during this encounter with the patient including review of chart and various tests results, discussions about plan of care and coordination of care plan.  This document was completed utilizing speech recognition software. Grammatical errors, random word insertions, pronoun errors, and incomplete sentences are an occasional consequence  of this system due to software limitations, ambient noise, and hardware issues.  Any formal questions or concerns about the content, text or information contained within the body of this dictation should be directly addressed to the provider for clarification.  "

## 2024-12-31 ENCOUNTER — Encounter: Payer: Self-pay | Admitting: Oncology

## 2024-12-31 LAB — ANA W/REFLEX IF POSITIVE: Anti Nuclear Antibody (ANA): NEGATIVE

## 2024-12-31 NOTE — Assessment & Plan Note (Signed)
 Chronic DVT of the left popliteal vein with multiple thrombotic events since 2005. He remains on long-term anticoagulation due to recurrent events and persistent risk factors. No new symptoms reported. - Continue long-term warfarin anticoagulation as per current regimen. - Follow up in three months.

## 2024-12-31 NOTE — Assessment & Plan Note (Signed)
 Recurrent, severe bilateral pulmonary embolism, most recently in August 2025 requiring hospitalization, arterial thrombectomy, and IVC filter replacement.   He had extensive thrombophilia workup previously.  There was concern for lupus anticoagulant positivity.  Regardless, given recurrent thromboembolism, he is on lifelong anticoagulation.  He is managed with long-term warfarin anticoagulation.   He tolerates warfarin without bleeding complications and undergoes regular INR monitoring at Coumadin  clinic.   Currently he is taking warfarin 8 mg on Mondays Wednesdays, Fridays and 4 mg on rest of the days.  INR has been mostly therapeutic in recent months.  Previous anticoagulant trials included apixaban and rivaroxaban, with rivaroxaban discontinued due to severe gastrointestinal bleeding. He has also received enoxaparin  post-thrombotic events until INR stabilization. No current symptoms of chest pain, dyspnea, dizziness, or lightheadedness.  - Continue long-term warfarin anticoagulation with ongoing Coumadin  clinic follow-up and INR monitoring.  - Follow up in three months for ongoing management.

## 2024-12-31 NOTE — Assessment & Plan Note (Signed)
 Mild, chronic thrombocytopenia on laboratory evaluation. Prior B12 and folic acid  levels were normal. Etiology remains unclear; further evaluation is warranted given ongoing anticoagulation therapy.  -Labs today showed stable platelet count of 96,000.  White count 3400 with normal differential, ANC 1600.  Iron studies, LDH, CMP unremarkable.  Immature platelet fraction is normal at 2.9%.    It is okay to continue anticoagulation as long as platelet count is above 50,000.  - Defer repeat B12 and folic acid  testing as prior results were normal.  - Follow up in three months to review laboratory results and reassess management.

## 2025-01-11 ENCOUNTER — Other Ambulatory Visit: Payer: Self-pay | Admitting: Interventional Radiology

## 2025-01-11 ENCOUNTER — Ambulatory Visit: Admitting: *Deleted

## 2025-01-11 DIAGNOSIS — I824Y9 Acute embolism and thrombosis of unspecified deep veins of unspecified proximal lower extremity: Secondary | ICD-10-CM

## 2025-01-11 DIAGNOSIS — I2699 Other pulmonary embolism without acute cor pulmonale: Secondary | ICD-10-CM

## 2025-01-11 DIAGNOSIS — I2692 Saddle embolus of pulmonary artery without acute cor pulmonale: Secondary | ICD-10-CM

## 2025-01-11 DIAGNOSIS — Z7901 Long term (current) use of anticoagulants: Secondary | ICD-10-CM

## 2025-01-11 DIAGNOSIS — I82532 Chronic embolism and thrombosis of left popliteal vein: Secondary | ICD-10-CM | POA: Diagnosis not present

## 2025-01-11 DIAGNOSIS — Z95828 Presence of other vascular implants and grafts: Secondary | ICD-10-CM

## 2025-01-11 LAB — POCT INR: INR: 2.7 (ref 2.0–3.0)

## 2025-01-11 NOTE — Progress Notes (Signed)
 Description   INR-2.7; Take 3 tablets today and then START taking warfarin 2 tablet daily except 1 tablet on Tuesday, Thursday, and Saturday. Continue eating 1 small serving leafy green veggie weekly. Recheck INR in 2 weeks.    Anticoagulation Clinic [phone]

## 2025-01-11 NOTE — Patient Instructions (Signed)
 Description   INR-2.7; Take 3 tablets today and then START taking warfarin 2 tablet daily except 1 tablet on Tuesday, Thursday, and Saturday. Continue eating 1 small serving leafy green veggie weekly. Recheck INR in 2 weeks.    Anticoagulation Clinic [phone]

## 2025-01-12 ENCOUNTER — Encounter: Payer: Self-pay | Admitting: Cardiology

## 2025-01-12 ENCOUNTER — Ambulatory Visit: Admitting: Cardiology

## 2025-01-12 VITALS — BP 112/78 | HR 72 | Ht 66.0 in | Wt 240.0 lb

## 2025-01-12 DIAGNOSIS — I25118 Atherosclerotic heart disease of native coronary artery with other forms of angina pectoris: Secondary | ICD-10-CM

## 2025-01-12 DIAGNOSIS — I2699 Other pulmonary embolism without acute cor pulmonale: Secondary | ICD-10-CM

## 2025-01-12 DIAGNOSIS — E782 Mixed hyperlipidemia: Secondary | ICD-10-CM | POA: Diagnosis not present

## 2025-01-12 NOTE — Progress Notes (Signed)
" °  Cardiology Office Note:  .   Date:  01/12/2025  ID:  Travis Rollins, DOB 1966-11-15, MRN [id number] PCP: No primary care provider on file.  [facility] Providers Cardiologist:  Newman Lawrence, MD PCP: No primary care provider on file.  Chief Complaint  Patient presents with   Hyperlipidemia      History of Present Illness: .    Travis Rollins is a 58 y.o. male with hypertension, type 2 DM, h/o recurrent DVT/PE, mild nonobstructive coronary artery disease.  Since his last visit with me, patient unfortunately had acute PE with RV strain in 07/2024.  His INR was reduced at that time at 1.3.  Fortunately, he underwent mechanical thrombectomy with bilateral clot retrieval, IVC filter removal and placement of a new IVC filter by interventional radiology.  Subsequent echocardiogram showed improvement in RV strain.  He is doing well without any complaints at this time.  He is also being evaluated by hematology and recommend lifelong anticoagulation.  His anticoagulation is now monitored by our Coumadin  clinic.  Vitals:   01/12/25 1110  BP: 112/78  Pulse: 72  SpO2: 98%     ROS:  Review of Systems  Cardiovascular:  Negative for chest pain, dyspnea on exertion, leg swelling, palpitations and syncope.     Studies Reviewed: SABRA        EKG 07/12/2024: Sinus tachycardia 110 bpm IVCD Nonspecific ST and ST abnormality  EKG 09/25/2023: Normal sinus rhythm Low voltage QRS When compared with ECG of 19-May-2021 21:04, Nonspecific T wave abnormality no longer evident in Inferior leads      Physical Exam:   Physical Exam Vitals and nursing note reviewed.  Constitutional:      General: He is not in acute distress. Neck:     Vascular: No JVD.  Cardiovascular:     Rate and Rhythm: Normal rate and regular rhythm.     Heart sounds: Normal heart sounds. No murmur heard. Pulmonary:     Effort: Pulmonary effort is normal.     Breath sounds: Normal breath sounds. No  wheezing or rales.  Musculoskeletal:     Right lower leg: No edema.     Left lower leg: No edema.      VISIT DIAGNOSES:   ICD-10-CM   1. Mixed hyperlipidemia  E78.2 Lipid panel    2. Coronary artery disease of native artery of native heart with stable angina pectoris  I25.118     3. Bilateral pulmonary embolism (HCC)  I26.99        ASSESSMENT AND PLAN: .    Travis Rollins is a 59 y.o. male with hypertension, type 2 DM, h/o recurrent DVT/PE, now s/p IVC filter placement and on long-term anticoagulation, mild nonobstructive coronary artery disease.   Coronary artery disease: Minimal CAD seen on coronary angiogram in 2016.   No angina symptoms at this time.  Continue aggressive risk factor modification.  Not on aspirin  given ongoing use of warfarin for long-term anticoagulation.   Check lipid panel.    Recurrent PE: Continue warfarin, monitored by Coumadin  clinic.  It is imperative that he stays within goal range  Type 2 DM: Continue management as per PCP.   Thrombocytopenia: Continue follow-up with hematology.   Venous insufficiency: No overt symptoms, other than lower extremity discoloration.  Recommend compression stockings.   F/u in 1 year  Signed, Newman JINNY Lawrence, MD  "

## 2025-01-12 NOTE — Patient Instructions (Signed)
" °  Lab Work: Lipid panel   If you have labs (blood work) drawn today and your tests are completely normal, you will receive your results only by: MyChart Message (if you have MyChart) OR A paper copy in the mail If you have any lab test that is abnormal or we need to change your treatment, we will call you to review the results.  Follow-Up: At [facility] you and your health needs are our priority.  As part of our continuing mission to provide you with exceptional heart care, our providers are all part of one team.  This team includes your primary Cardiologist (physician) and Advanced Practice Providers or APPs (Physician Assistants and Nurse Practitioners) who all work together to provide you with the care you need, when you need it.  Your next appointment:   1 year(s)  Provider:   Newman JINNY Lawrence, MD    We recommend signing up for the patient portal called MyChart.  Sign up information is provided on this After Visit Summary.  MyChart is used to connect with patients for Virtual Visits (Telemedicine).  Patients are able to view lab/test results, encounter notes, upcoming appointments, etc.  Non-urgent messages can be sent to your provider as well.   To learn more about what you can do with MyChart, go to [email or web address].   Other Instructions          "

## 2025-01-13 ENCOUNTER — Ambulatory Visit: Payer: Self-pay | Admitting: Cardiology

## 2025-01-13 LAB — LIPID PANEL
Chol/HDL Ratio: 3.7 ratio (ref 0.0–5.0)
Cholesterol, Total: 164 mg/dL (ref 100–199)
HDL: 44 mg/dL
LDL Chol Calc (NIH): 105 mg/dL — ABNORMAL HIGH (ref 0–99)
Triglycerides: 79 mg/dL (ref 0–149)
VLDL Cholesterol Cal: 15 mg/dL (ref 5–40)

## 2025-01-17 ENCOUNTER — Ambulatory Visit: Admitting: Gastroenterology

## 2025-01-24 ENCOUNTER — Other Ambulatory Visit

## 2025-01-25 ENCOUNTER — Ambulatory Visit

## 2025-02-03 ENCOUNTER — Other Ambulatory Visit

## 2025-02-10 ENCOUNTER — Ambulatory Visit: Admitting: Podiatry

## 2025-03-30 ENCOUNTER — Inpatient Hospital Stay: Attending: Medical Oncology

## 2025-03-30 ENCOUNTER — Inpatient Hospital Stay: Admitting: Oncology
